# Patient Record
Sex: Male | Born: 1960 | Race: White | Hispanic: No | Marital: Single | Smoking: Current every day smoker
Health system: Southern US, Community
[De-identification: ages and names within clinical notes are randomized; demographics above are authoritative.]

## PROBLEM LIST (undated history)

## (undated) ENCOUNTER — Ambulatory Visit

## (undated) ENCOUNTER — Encounter

## (undated) ENCOUNTER — Encounter: Attending: Geriatric Medicine | Primary: Geriatric Medicine

## (undated) ENCOUNTER — Telehealth

## (undated) ENCOUNTER — Encounter: Attending: Internal Medicine | Primary: Internal Medicine

## (undated) ENCOUNTER — Ambulatory Visit: Payer: MEDICAID

## (undated) ENCOUNTER — Ambulatory Visit: Attending: Internal Medicine | Primary: Internal Medicine

## (undated) ENCOUNTER — Telehealth: Attending: Internal Medicine | Primary: Internal Medicine

## (undated) ENCOUNTER — Encounter: Attending: Urology | Primary: Urology

## (undated) ENCOUNTER — Ambulatory Visit: Payer: MEDICAID | Attending: Family | Primary: Family

## (undated) ENCOUNTER — Ambulatory Visit
Attending: Pharmacist Clinician (PhC)/ Clinical Pharmacy Specialist | Primary: Pharmacist Clinician (PhC)/ Clinical Pharmacy Specialist

## (undated) ENCOUNTER — Ambulatory Visit: Payer: MEDICAID | Attending: Internal Medicine | Primary: Internal Medicine

## (undated) ENCOUNTER — Ambulatory Visit: Payer: MEDICAID | Attending: Geriatric Medicine | Primary: Geriatric Medicine

## (undated) ENCOUNTER — Inpatient Hospital Stay

## (undated) ENCOUNTER — Telehealth: Attending: Urology | Primary: Urology

## (undated) ENCOUNTER — Ambulatory Visit
Attending: Student in an Organized Health Care Education/Training Program | Primary: Student in an Organized Health Care Education/Training Program

## (undated) ENCOUNTER — Ambulatory Visit: Attending: Urology | Primary: Urology

## (undated) ENCOUNTER — Telehealth: Attending: Family | Primary: Family

## (undated) ENCOUNTER — Telehealth: Attending: Geriatric Medicine | Primary: Geriatric Medicine

## (undated) DIAGNOSIS — C7951 Secondary malignant neoplasm of bone: Principal | ICD-10-CM

## (undated) DIAGNOSIS — C649 Malignant neoplasm of unspecified kidney, except renal pelvis: Secondary | ICD-10-CM

## (undated) HISTORY — PX: HERNIA REPAIR: SHX51

## (undated) HISTORY — DX: Secondary malignant neoplasm of bone: C79.51

## (undated) HISTORY — DX: Malignant neoplasm of unspecified kidney, except renal pelvis: C64.9

## (undated) HISTORY — PX: OTHER SURGICAL HISTORY: SHX169

---
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
## 2005-07-10 ENCOUNTER — Emergency Department: Payer: Self-pay | Admitting: Emergency Medicine

## 2008-03-21 ENCOUNTER — Emergency Department: Payer: Self-pay | Admitting: Emergency Medicine

## 2009-05-13 ENCOUNTER — Emergency Department: Payer: Self-pay | Admitting: Unknown Physician Specialty

## 2017-04-09 ENCOUNTER — Encounter: Payer: Self-pay | Admitting: Emergency Medicine

## 2017-04-09 ENCOUNTER — Emergency Department
Admission: EM | Admit: 2017-04-09 | Discharge: 2017-04-09 | Disposition: A | Discharge status: Home / Self Care | Payer: Self-pay | Attending: Emergency Medicine | Admitting: Emergency Medicine

## 2017-04-09 DIAGNOSIS — F17200 Nicotine dependence, unspecified, uncomplicated: Secondary | ICD-10-CM | POA: Insufficient documentation

## 2017-04-09 DIAGNOSIS — M62830 Muscle spasm of back: Secondary | ICD-10-CM

## 2017-04-09 DIAGNOSIS — M62838 Other muscle spasm: Secondary | ICD-10-CM | POA: Insufficient documentation

## 2017-04-09 MED ORDER — KETOROLAC TROMETHAMINE 30 MG/ML IJ SOLN
30.0000 mg | Freq: Once | INTRAMUSCULAR | Status: AC
  Administered 2017-04-09: 30 mg via INTRAMUSCULAR

## 2017-04-09 MED ORDER — NAPROXEN 500 MG PO TABS: 500.0000 mg | ORAL_TABLET | Freq: Two times a day (BID) | ORAL | 2 refills | Status: DC

## 2017-04-09 MED ORDER — CYCLOBENZAPRINE HCL 10 MG PO TABS: 10.0000 mg | ORAL_TABLET | Freq: Three times a day (TID) | ORAL | 0 refills | Status: DC | PRN

## 2017-04-09 MED ORDER — KETOROLAC TROMETHAMINE 30 MG/ML IJ SOLN
INTRAMUSCULAR | Status: AC
  Filled 2017-04-09: qty 1

## 2017-04-09 NOTE — ED Provider Notes (Signed)
[facility] Emergency Department Provider Note   ____________________________________________    I have reviewed the triage vital signs and the nursing notes.   HISTORY  Chief Complaint Back Pain     HPI Larry Watkins is a 56 y.o. male who p/w back pain. Patient reports moderate back pain that started last week in between his shoulder blades and was worth with movement. Pain has migrated to left mid back just beneath shoulder blade and he reports it is a moderate "grabbing" pain worse with rotation and flexion. Has taken ibuprofen. No chest pain. Nofevers/chills. No abd pain. No sob or pleurisy.   History reviewed. No pertinent past medical history.  There are no active problems to display for this patient.   Past Surgical History:  Procedure Laterality Date  . HERNIA REPAIR      Prior to Admission medications   Medication Sig Start Date End Date Taking? Authorizing Provider  cyclobenzaprine (FLEXERIL) 10 MG tablet Take 1 tablet (10 mg total) by mouth 3 (three) times daily as needed for muscle spasms. 04/09/17   Lavonia Drafts, MD  naproxen (NAPROSYN) 500 MG tablet Take 1 tablet (500 mg total) by mouth 2 (two) times daily with a meal. 04/09/17   Lavonia Drafts, MD     Allergies Patient has no known allergies.  No family history on file.  Social History Social History  Substance Use Topics  . Smoking status: Current Every Day Smoker  . Smokeless tobacco: Never Used  . Alcohol use No    Review of Systems  Constitutional: No fever/chills  ENT: No sore throat.   Gastrointestinal: No abdominal pain.  No nausea, no vomiting.   Genitourinary: Negative for incontinence Musculoskeletal: as above Skin: Negative for rash. Neurological: Negative for weakness    ____________________________________________   PHYSICAL EXAM:  VITAL SIGNS: ED Triage Vitals  Enc Vitals Group     BP 04/09/17 1008 (!) 162/92     Pulse Rate 04/09/17  1008 77     Resp 04/09/17 1008 16     Temp 04/09/17 1008 98.2 F (36.8 C)     Temp Source 04/09/17 1008 Oral     SpO2 04/09/17 1008 100 %     Weight 04/09/17 1008 242 lb (109.8 kg)     Height 04/09/17 1008 5\' 7"  (1.702 m)     Head Circumference --      Peak Flow --      Pain Score 04/09/17 1005 8     Pain Loc --      Pain Edu? --      Excl. in [place]? --     Constitutional: Alert and oriented. No acute distress. Pleasant and interactive Eyes: Conjunctivae are normal.  Head: Atraumatic. Nose: No congestion/rhinnorhea. Mouth/Throat: Mucous membranes are moist.   Cardiovascular: Normal rate, regular rhythm.  Respiratory: Normal respiratory effort.  No retractions. Genitourinary: deferred Musculoskeletal: No lower extremity tenderness nor edema.  Back: no vertebral ttp. Point ttp just beneath the left scapula centrally which mimics his pain. C/w muscle spasm Neurologic:  Normal speech and language. No gross focal neurologic deficits are appreciated.   Skin:  Skin is warm, dry and intact. No rash noted.   ____________________________________________   LABS (all labs ordered are listed, but only abnormal results are displayed)  Labs Reviewed - No data to display ____________________________________________  EKG   ____________________________________________  RADIOLOGY  none ____________________________________________   PROCEDURES  Procedure(s) performed: No    Critical Care performed: No  ____________________________________________   INITIAL IMPRESSION / ASSESSMENT AND PLAN / ED COURSE  Pertinent labs & imaging results that were available during my care of the patient were reviewed by me and considered in my medical decision making (see chart for details).  Patient hpi and exam c/w muscle strain/spasm. Recommend conservative tx   ____________________________________________   FINAL CLINICAL IMPRESSION(S) / ED DIAGNOSES  Final diagnoses:  Muscle spasm of  back      NEW MEDICATIONS STARTED DURING THIS VISIT:  Discharge Medication List as of 04/09/2017 11:39 AM    START taking these medications   Details  cyclobenzaprine (FLEXERIL) 10 MG tablet Take 1 tablet (10 mg total) by mouth 3 (three) times daily as needed for muscle spasms., Starting Wed 04/09/2017, Print    naproxen (NAPROSYN) 500 MG tablet Take 1 tablet (500 mg total) by mouth 2 (two) times daily with a meal., Starting Wed 04/09/2017, Print         Note:  This document was prepared using Dragon voice recognition software and may include unintentional dictation errors.    Lavonia Drafts, MD 04/09/17 [phone]

## 2017-04-09 NOTE — ED Notes (Signed)
Pt reports lower back pain that started one week ago - pt denies any injury to the area - denies pain or difficulty with urination

## 2017-04-09 NOTE — ED Triage Notes (Signed)
Back pain for about a week started upper back and now left lower.  Increases with movement

## 2017-06-26 ENCOUNTER — Emergency Department: Payer: Self-pay

## 2017-06-26 ENCOUNTER — Encounter: Payer: Self-pay | Admitting: Emergency Medicine

## 2017-06-26 ENCOUNTER — Emergency Department
Admission: EM | Admit: 2017-06-26 | Discharge: 2017-06-26 | Disposition: A | Discharge status: Home / Self Care | Payer: Self-pay | Attending: Emergency Medicine | Admitting: Emergency Medicine

## 2017-06-26 DIAGNOSIS — Y9389 Activity, other specified: Secondary | ICD-10-CM | POA: Insufficient documentation

## 2017-06-26 DIAGNOSIS — Y929 Unspecified place or not applicable: Secondary | ICD-10-CM | POA: Insufficient documentation

## 2017-06-26 DIAGNOSIS — N2889 Other specified disorders of kidney and ureter: Secondary | ICD-10-CM | POA: Insufficient documentation

## 2017-06-26 DIAGNOSIS — R109 Unspecified abdominal pain: Secondary | ICD-10-CM | POA: Insufficient documentation

## 2017-06-26 DIAGNOSIS — M899 Disorder of bone, unspecified: Secondary | ICD-10-CM | POA: Insufficient documentation

## 2017-06-26 DIAGNOSIS — X501XXA Overexertion from prolonged static or awkward postures, initial encounter: Secondary | ICD-10-CM | POA: Insufficient documentation

## 2017-06-26 DIAGNOSIS — M84411A Pathological fracture, right shoulder, initial encounter for fracture: Secondary | ICD-10-CM | POA: Insufficient documentation

## 2017-06-26 DIAGNOSIS — M25511 Pain in right shoulder: Secondary | ICD-10-CM | POA: Insufficient documentation

## 2017-06-26 DIAGNOSIS — Y998 Other external cause status: Secondary | ICD-10-CM | POA: Insufficient documentation

## 2017-06-26 LAB — CBC WITH DIFFERENTIAL/PLATELET
Basophils Absolute: 0 10*3/uL (ref 0–0.1)
Basophils Relative: 1 %
Eosinophils Absolute: 0.2 10*3/uL (ref 0–0.7)
Eosinophils Relative: 2 %
HEMATOCRIT: 45 % (ref 40.0–52.0)
Hemoglobin: 15.5 g/dL (ref 13.0–18.0)
LYMPHS PCT: 30 %
Lymphs Abs: 2.4 10*3/uL (ref 1.0–3.6)
MCH: 30.9 pg (ref 26.0–34.0)
MCHC: 34.5 g/dL (ref 32.0–36.0)
MCV: 89.7 fL (ref 80.0–100.0)
MONO ABS: 0.7 10*3/uL (ref 0.2–1.0)
MONOS PCT: 9 %
NEUTROS ABS: 4.6 10*3/uL (ref 1.4–6.5)
Neutrophils Relative %: 58 %
Platelets: 231 10*3/uL (ref 150–440)
RBC: 5.01 MIL/uL (ref 4.40–5.90)
RDW: 12.9 % (ref 11.5–14.5)
WBC: 8 10*3/uL (ref 3.8–10.6)

## 2017-06-26 LAB — URINALYSIS, COMPLETE (UACMP) WITH MICROSCOPIC
Bacteria, UA: NONE SEEN
Bilirubin Urine: NEGATIVE
GLUCOSE, UA: NEGATIVE mg/dL
Ketones, ur: NEGATIVE mg/dL
LEUKOCYTES UA: NEGATIVE
NITRITE: NEGATIVE
PH: 6 (ref 5.0–8.0)
Protein, ur: NEGATIVE mg/dL
Specific Gravity, Urine: 1.017 (ref 1.005–1.030)

## 2017-06-26 LAB — COMPREHENSIVE METABOLIC PANEL
ALT: 16 U/L — ABNORMAL LOW (ref 17–63)
ANION GAP: 6 (ref 5–15)
AST: 14 U/L — ABNORMAL LOW (ref 15–41)
Albumin: 3.3 g/dL — ABNORMAL LOW (ref 3.5–5.0)
Alkaline Phosphatase: 111 U/L (ref 38–126)
BUN: 13 mg/dL (ref 6–20)
CO2: 30 mmol/L (ref 22–32)
Calcium: 9 mg/dL (ref 8.9–10.3)
Chloride: 103 mmol/L (ref 101–111)
Creatinine, Ser: 0.95 mg/dL (ref 0.61–1.24)
GFR calc Af Amer: >60 mL/min (ref >60)
Glucose, Bld: 104 mg/dL — ABNORMAL HIGH (ref 65–99)
POTASSIUM: 4.2 mmol/L (ref 3.5–5.1)
Sodium: 139 mmol/L (ref 135–145)
TOTAL PROTEIN: 6.6 g/dL (ref 6.5–8.1)
Total Bilirubin: 0.2 mg/dL — ABNORMAL LOW (ref 0.3–1.2)

## 2017-06-26 LAB — PROTIME-INR
INR: 0.93
PROTHROMBIN TIME: 12.5 s (ref 11.4–15.2)

## 2017-06-26 LAB — LIPASE, BLOOD: Lipase: 22 U/L (ref 11–51)

## 2017-06-26 MED ORDER — MORPHINE SULFATE (PF) 4 MG/ML IV SOLN
4.0000 mg | Freq: Once | INTRAVENOUS | Status: AC
  Administered 2017-06-26: 4 mg via INTRAVENOUS
  Filled 2017-06-26: qty 1

## 2017-06-26 MED ORDER — IOPAMIDOL (ISOVUE-300) INJECTION 61%
100.0000 mL | Freq: Once | INTRAVENOUS | Status: AC | PRN
  Administered 2017-06-26: 100 mL via INTRAVENOUS

## 2017-06-26 MED ORDER — DOCUSATE SODIUM 100 MG PO CAPS: 100.0000 mg | ORAL_CAPSULE | Freq: Every day | ORAL | 2 refills | Status: DC | PRN

## 2017-06-26 MED ORDER — ONDANSETRON HCL 4 MG/2ML IJ SOLN
4.0000 mg | Freq: Once | INTRAMUSCULAR | Status: AC
  Administered 2017-06-26: 4 mg via INTRAVENOUS
  Filled 2017-06-26: qty 2

## 2017-06-26 MED ORDER — OXYCODONE-ACETAMINOPHEN 5-325 MG PO TABS: 1.0000 | ORAL_TABLET | Freq: Four times a day (QID) | ORAL | 0 refills | Status: DC | PRN

## 2017-06-26 NOTE — ED Notes (Signed)
Pt in xray

## 2017-06-26 NOTE — Discharge Instructions (Signed)
As we discussed, we see a mass on your  kidney which is likely cancer, with lesions on your bones that are likely also cthe same.  We are very sorry to share this news with you. Take the pain medication as prescribed, and follow up closely with oncology tomorrow. You will also need to see an orthopedic doctor. I am giving you information about [facility] orthopedics, however, I would ask the oncologist tomorrow whom they would recommend that you follow up with. If you have any new or worrisome symptoms, return to the emergency department. Wear the splint for comfort. Do not drink or drive on percocet.

## 2017-06-26 NOTE — ED Triage Notes (Signed)
Pt reports right shoulder pain and immobility for three months after reaching below a bar to grab a glass. Pt denies new shoulder pain but states just can't take the pain anymore. Pt unable to raise right arm at all. Pt reports new onset flank pain with pink urine for 3-4 days.

## 2017-06-26 NOTE — ED Notes (Signed)
Arm sling applied to right arm by this Probation officer. Cms intact.

## 2017-06-26 NOTE — ED Provider Notes (Addendum)
[facility] Emergency Department Provider Note  ____________________________________________   I have reviewed the triage vital signs and the nursing notes.   HISTORY  Chief Complaint Shoulder Pain and Flank Pain    HPI Larry Watkins is a 56 y.o. male who presents today complaining of right-sided shoulder pain which she has had for 3 or 4 months. He states that he felt a "pop" when he was reaching down to pickups a glass on the floor a few months ago and the pain has been getting worse and worse. He also states he has a history of kidney stones and has been having left-sided flank pain for the last week. This is not consistent though he believes his prior kidney stone pain. He has hadno dysuria but he did notice hematuria about 2 weeks ago and took a few doses of amoxicillin which she had, the hematuria cleared up and he stopped taking that antibiotic. He had no time had any fever. He denies any nausea or vomiting, he has no night sweats or weight loss. Patient has at least a 40 year smoking history, he states that he has had a slight cough recently. He denies any headache he denies any recent travel he denies any abdominal pain or vomiting or change in his stool.     History reviewed. No pertinent past medical history.  There are no active problems to display for this patient.   Past Surgical History:  Procedure Laterality Date  . HERNIA REPAIR      Prior to Admission medications   Medication Sig Start Date End Date Taking? Authorizing Provider  ibuprofen (ADVIL,MOTRIN) 200 MG tablet Take 200 mg by mouth every 6 (six) hours as needed.   Yes [provider]  cyclobenzaprine (FLEXERIL) 10 MG tablet Take 1 tablet (10 mg total) by mouth 3 (three) times daily as needed for muscle spasms. Patient not taking: Reported on 06/26/2017 04/09/17   Lavonia Drafts, MD  naproxen (NAPROSYN) 500 MG tablet Take 1 tablet (500 mg total) by mouth 2 (two) times daily  with a meal. Patient not taking: Reported on 06/26/2017 04/09/17   Lavonia Drafts, MD    Allergies Patient has no known allergies.  No family history on file.  Social History Social History  Substance Use Topics  . Smoking status: Current Every Day Smoker  . Smokeless tobacco: Never Used  . Alcohol use No    Review of Systems Constitutional: No fever/chills Eyes: No visual changes. ENT: No sore throat. No stiff neck no neck pain Cardiovascular: Denies chest pain. Respiratory: Denies shortness of breath. Gastrointestinal:   no vomiting.  No diarrhea.  No constipation. Genitourinary: Negative for dysuria. Musculoskeletal: Negative lower extremity swelling Skin: Negative for rash. Neurological: Negative for severe headaches, focal weakness or numbness.   ____________________________________________   PHYSICAL EXAM:  VITAL SIGNS: ED Triage Vitals [06/26/17 1013]  Enc Vitals Group     BP (!) 152/80     Pulse Rate 92     Resp 20     Temp 98.4 F (36.9 C)     Temp Source Oral     SpO2 95 %     Weight 248 lb (112.5 kg)     Height 5\' 7"  (1.702 m)     Head Circumference      Peak Flow      Pain Score 10     Pain Loc      Pain Edu?      Excl. in [place]?  Constitutional: Alert and oriented. Well appearing and in no acute distress.Anxious and appears mildly uncomfortable holding his right arm and flexion over his abdomen Eyes: Conjunctivae are normal Head: Atraumatic HEENT: No congestion/rhinnorhea. Mucous membranes are moist.  Oropharynx non-erythematous Neck:   Nontender with no meningismus, no masses, no stridor Cardiovascular: Normal rate, regular rhythm. Grossly normal heart sounds.  Good peripheral circulation. Respiratory: Normal respiratory effort.  No retractions. Lungs CTAB. Abdominal: Soft and nontender. No distention. No guarding no rebound Back:  There is no focal tenderness or step off.  there is no midline tenderness there are no lesions noted. there is  positive left CVA tenderness GU: Normal motion of genitalia no obvious masses noted. Musculoskeletal: No lower extremity tenderness, there is no tenderness to the wrist elbow or shoulder itself, the palpation of the scalp feeling provides the patient a great of discomfort as well as raising the shoulder. There is no erythema it is not warm to touch. No joint effusions, no DVT signs strong distal pulses no edema Neurologic:  Normal speech and language. No gross focal neurologic deficits are appreciated.  Skin:  Skin is warm, dry and intact. No rash noted. Psychiatric: Mood and affect are normal. Speech and behavior are normal.  ____________________________________________   LABS (all labs ordered are listed, but only abnormal results are displayed)  Labs Reviewed  URINALYSIS, COMPLETE (UACMP) WITH MICROSCOPIC - Abnormal; Notable for the following:       Result Value   Color, Urine YELLOW (*)    APPearance CLEAR (*)    Hgb urine dipstick MODERATE (*)    Squamous Epithelial / LPF 0-5 (*)    All other components within normal limits  URINE CULTURE  CBC WITH DIFFERENTIAL/PLATELET  COMPREHENSIVE METABOLIC PANEL  PROTIME-INR  LIPASE, BLOOD   ____________________________________________  EKG  I personally interpreted any EKGs ordered by me or triage  ____________________________________________  RADIOLOGY  I reviewed any imaging ordered by me or triage that were performed during my shift and, if possible, patient and/or family made aware of any abnormal findings. ____________________________________________   PROCEDURES  Procedure(s) performed: None  Procedures  Critical Care performed: None  ____________________________________________   INITIAL IMPRESSION / ASSESSMENT AND PLAN / ED COURSE  Pertinent labs & imaging results that were available during my care of the patient were reviewed by me and considered in my medical decision making (see chart for  details).  Administration here for 2 different things. The first is his right shoulder pain which is been there for several months. X-ray shows what is likely a lytic lesion causing a pathologic fracture. This raises concern for possibility of oncologic process other where in the patient's body. I did discuss with Dr. Susy Manor, who would prefer a CT chest abdomen pelvis which we will performed. I discussed with radiology, they do not think that a noncontrasted study of his abdomen be of utility as they do believe they can see kidney stones even if we give contrast. Kidney stones is also on the differential patient's pain in his flank and his hematuria could be true true and unrelated. If his Kidney function is reassuring, we will obtain contrasted images. If not, we'll do it without contrast.  ----------------------------------------- 3:52 PM on 06/26/2017 -----------------------------------------  Dr. Susy Manor has arranged outpatient follow-up with the patient tomorrow at 3:00, she feels comfortable discharge as do I, patient very much would prefer to go home he does not wish to be admitted to the hospital. We will give him a sling for  comfort for his pathologic scapular fracture, I will refer him to [facility] orthopedics as I suspect this likely will transcend they will use of our local providers. However I did specify that he does need to talk to oncology about what the best course of action is for his pathologic bone lesions. We will send him home with Percocet. He knows he must not drive and Percocet. 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 He does understand the gravity of it. He will return for new or worrisome symptoms and he is going to follow closely with oncology and orthopedics. Patient grateful for our care, extensive return precautions given and understood.  This is a very unfortunate case.    ____________________________________________   FINAL CLINICAL IMPRESSION(S) / ED DIAGNOSES  Final diagnoses:  None      This chart was dictated using voice recognition software.  Despite best efforts to proofread,  errors can occur which can change meaning.      Schuyler Amor, MD 06/26/17 1237    Schuyler Amor, MD 06/26/17 [phone]

## 2017-06-27 ENCOUNTER — Encounter: Payer: Self-pay | Admitting: Oncology

## 2017-06-27 ENCOUNTER — Inpatient Hospital Stay: Payer: Self-pay | Attending: Oncology | Admitting: Oncology

## 2017-06-27 ENCOUNTER — Inpatient Hospital Stay: Payer: Self-pay

## 2017-06-27 VITALS — BP 148/87 | HR 82 | Temp 98.0°F | Wt 244.2 lb

## 2017-06-27 DIAGNOSIS — M25511 Pain in right shoulder: Secondary | ICD-10-CM

## 2017-06-27 DIAGNOSIS — F17210 Nicotine dependence, cigarettes, uncomplicated: Secondary | ICD-10-CM

## 2017-06-27 DIAGNOSIS — M899 Disorder of bone, unspecified: Secondary | ICD-10-CM

## 2017-06-27 DIAGNOSIS — N2889 Other specified disorders of kidney and ureter: Secondary | ICD-10-CM

## 2017-06-27 DIAGNOSIS — R109 Unspecified abdominal pain: Secondary | ICD-10-CM

## 2017-06-27 DIAGNOSIS — M7989 Other specified soft tissue disorders: Secondary | ICD-10-CM

## 2017-06-27 DIAGNOSIS — Z720 Tobacco use: Secondary | ICD-10-CM

## 2017-06-27 DIAGNOSIS — C7951 Secondary malignant neoplasm of bone: Secondary | ICD-10-CM

## 2017-06-27 LAB — URINE CULTURE

## 2017-06-27 LAB — LACTATE DEHYDROGENASE: LDH: 167 U/L (ref 98–192)

## 2017-06-29 ENCOUNTER — Encounter: Payer: Self-pay | Admitting: Oncology

## 2017-06-29 NOTE — Progress Notes (Signed)
[name]:  Patient Care Team: Patient, No Pcp Per as PCP - General (General Practice)  CHIEF COMPLAINTS/PURPOSE OF CONSULTATION: I have Right shoulder pain.  HISTORY OF PRESENTING ILLNESS: Larry Watkins 56 y.o. male is here for evaluation of abnormal image finding. Patient has had right shoulder pain  three months after reaching below a bar to grab a glass. Patient is not able to raise his right arm over shoulder level.  Also having left flank pain and 2 weeks ago he has had pink urine for a few days and resolved after taking a course of amoxicillin.  He is otherwise healthy and does not take any medication.  CT scan showed a concerning right kidney mass with possible metastatic disease with thoracic and abdominal lymphadenopathy, lung nodules, and diffuse osseous metastases. Patient has 40 pack year smoking history.    Review of Systems  Constitutional: Negative.   HENT:  Negative.   Eyes: Negative.   Respiratory: Negative.   Cardiovascular: Negative.   Gastrointestinal: Negative.   Endocrine: Negative.   Musculoskeletal: Positive for arthralgias and flank pain.  Skin: Negative.   Neurological: Negative.   Hematological: Negative.   Psychiatric/Behavioral: Negative.     MEDICAL HISTORY: Past Medical History:  Diagnosis Date  . Cancer [place])     SURGICAL HISTORY: Past Surgical History:  Procedure Laterality Date  . HERNIA REPAIR      SOCIAL HISTORY: Social History   Social History  . Marital status: Single    Spouse name: N/A  . Number of children: N/A  . Years of education: N/A   Occupational History  . Not on file.   Social History Main Topics  . Smoking status: Current Every Day Smoker    Packs/day: 2.00    Years: 40.00  . Smokeless tobacco: Never Used  . Alcohol use No  . Drug use: No  . Sexual activity: Not on file   Other Topics Concern  . Not on file   Social History Narrative  . No narrative on file     FAMILY HISTORY History reviewed. No pertinent family history.  ALLERGIES:  has No Known Allergies.  MEDICATIONS:  No current outpatient prescriptions on file.   No current facility-administered medications for this visit.    Vitals:   06/27/17 1516  BP: (!) 148/87  Pulse: 82  Temp: 98 F (36.7 C)    Filed Weights   06/27/17 1516  Weight: 244 lb 3.2 oz (110.8 kg)  Physical Exam PHYSICAL EXAMINATION:  ECOG PERFORMANCE STATUS: 0 - Asymptomatic GENERAL: No distress, well nourished.  SKIN:  No rashes or significant lesions  HEAD: Normocephalic, No masses, lesions, tenderness or abnormalities  EYES: Conjunctiva are pink, non icteric ENT: External ears normal ,lips , buccal mucosa, and tongue normal and mucous membranes are moist  LYMPH: No palpable cervical and axillary lymphadenopathy  LUNGS: Clear to auscultation, no crackles or wheezes HEART: Regular rate & rhythm, no murmurs, no gallops, S1 normal and S2 normal  ABDOMEN: Abdomen soft, non-tender, normal bowel sounds, I did not appreciate any  masses or organomegaly  MUSCULOSKELETAL: No CVA tenderness and no tenderness on percussion of the back or rib cage. Right scapular blade palpable mass. EXTREMITIES: No edema, no skin discoloration or tenderness NEURO: Alert & oriented, no focal motor/sensory deficits.  LABORATORY DATA: I have personally reviewed the data as listed:  Appointment on 06/27/2017  Component Date Value Ref Range Status  . LDH 06/27/2017 167  98 -  192 U/L Final  Admission on 06/26/2017, Discharged on 06/26/2017  Component Date Value Ref Range Status  . Color, Urine 06/26/2017 YELLOW* YELLOW Final  . APPearance 06/26/2017 CLEAR* CLEAR Final  . Specific Gravity, Urine 06/26/2017 1.017  1.005 - 1.030 Final  . pH 06/26/2017 6.0  5.0 - 8.0 Final  . Glucose, UA 06/26/2017 NEGATIVE  NEGATIVE mg/dL Final  . Hgb urine dipstick 06/26/2017 MODERATE* NEGATIVE Final  . Bilirubin Urine 06/26/2017 NEGATIVE   NEGATIVE Final  . Ketones, ur 06/26/2017 NEGATIVE  NEGATIVE mg/dL Final  . Protein, ur 06/26/2017 NEGATIVE  NEGATIVE mg/dL Final  . Nitrite 06/26/2017 NEGATIVE  NEGATIVE Final  . Leukocytes, UA 06/26/2017 NEGATIVE  NEGATIVE Final  . RBC / HPF 06/26/2017 TOO NUMEROUS TO COUNT  0 - 5 RBC/hpf Final  . WBC, UA 06/26/2017 0-5  0 - 5 WBC/hpf Final  . Bacteria, UA 06/26/2017 NONE SEEN  NONE SEEN Final  . Squamous Epithelial / LPF 06/26/2017 0-5* NONE SEEN Final  . Mucous 06/26/2017 PRESENT   Final  . Hyaline Casts, UA 06/26/2017 PRESENT   Final  . WBC 06/26/2017 8.0  3.8 - 10.6 K/uL Final  . RBC 06/26/2017 5.01  4.40 - 5.90 MIL/uL Final  . Hemoglobin 06/26/2017 15.5  13.0 - 18.0 g/dL Final  . HCT 06/26/2017 45.0  40.0 - 52.0 % Final  . MCV 06/26/2017 89.7  80.0 - 100.0 fL Final  . MCH 06/26/2017 30.9  26.0 - 34.0 pg Final  . MCHC 06/26/2017 34.5  32.0 - 36.0 g/dL Final  . RDW 06/26/2017 12.9  11.5 - 14.5 % Final  . Platelets 06/26/2017 231  150 - 440 K/uL Final  . Neutrophils Relative % 06/26/2017 58  % Final  . Neutro Abs 06/26/2017 4.6  1.4 - 6.5 K/uL Final  . Lymphocytes Relative 06/26/2017 30  % Final  . Lymphs Abs 06/26/2017 2.4  1.0 - 3.6 K/uL Final  . Monocytes Relative 06/26/2017 9  % Final  . Monocytes Absolute 06/26/2017 0.7  0.2 - 1.0 K/uL Final  . Eosinophils Relative 06/26/2017 2  % Final  . Eosinophils Absolute 06/26/2017 0.2  0 - 0.7 K/uL Final  . Basophils Relative 06/26/2017 1  % Final  . Basophils Absolute 06/26/2017 0.0  0 - 0.1 K/uL Final  . Sodium 06/26/2017 139  135 - 145 mmol/L Final  . Potassium 06/26/2017 4.2  3.5 - 5.1 mmol/L Final  . Chloride 06/26/2017 103  101 - 111 mmol/L Final  . CO2 06/26/2017 30  22 - 32 mmol/L Final  . Glucose, Bld 06/26/2017 104* 65 - 99 mg/dL Final  . BUN 06/26/2017 13  6 - 20 mg/dL Final  . Creatinine, Ser 06/26/2017 0.95  0.61 - 1.24 mg/dL Final  . Calcium 06/26/2017 9.0  8.9 - 10.3 mg/dL Final  . Total Protein 06/26/2017 6.6  6.5  - 8.1 g/dL Final  . Albumin 06/26/2017 3.3* 3.5 - 5.0 g/dL Final  . AST 06/26/2017 14* 15 - 41 U/L Final  . ALT 06/26/2017 16* 17 - 63 U/L Final  . Alkaline Phosphatase 06/26/2017 111  38 - 126 U/L Final  . Total Bilirubin 06/26/2017 0.2* 0.3 - 1.2 mg/dL Final  . GFR calc non Af Amer 06/26/2017 >60  >60 mL/min Final  . GFR calc Af Amer 06/26/2017 >60  >60 mL/min Final   Comment: (NOTE) The eGFR has been calculated using the CKD EPI equation. This calculation has not been validated in all clinical situations. eGFR's persistently <60  mL/min signify possible Chronic Kidney Disease.   . Anion gap 06/26/2017 6  5 - 15 Final  . Specimen Description 06/26/2017 URINE, RANDOM   Final  . Special Requests 06/26/2017 NONE   Final  . Culture 06/26/2017 *  Final                   Value:<10,000 COLONIES/mL INSIGNIFICANT GROWTH Performed at [facility], [place] [address], [place], [place] [address]   . Report Status 06/26/2017 06/27/2017 FINAL   Final  . Prothrombin Time 06/26/2017 12.5  11.4 - 15.2 seconds Final  . INR 06/26/2017 0.93   Final  . Lipase 06/26/2017 22  11 - 51 U/L Final    RADIOGRAPHIC STUDIES: I have personally reviewed the radiological images as listed and agree with the findings in the report CT chest abdomen pelvis 06/26/2017  IMPRESSION: 1. 7 cm right lower pole renal mass. This may reflect urothelial carcinoma given involvement of the collecting system versus renal cell carcinoma. 2. Metastatic disease with thoracic and abdominal lymphadenopathy, lung nodules, and diffuse osseous metastases. 3. Subcentimeter lesion in the right upper lobe bronchus with diffuse abnormal right upper lobe bronchial wall soft tissue thickening.    ASSESSMENT/PLAN 1. Kidney mass   2. Soft tissue mass   3. Metastasis to bone ([place])   4. Tobacco abuse    # I reviewed the CT results with patient in details and informed him that his CT findings are very concerning for cancerous  process. The next step is to obtain a tissue diagnosis.  US guided biopsy of right scapula blade soft tissue.  # Patient to follow up 3-4 days after biopsy to discuss results and treatment plan.  # smoke cessation discussed in details.    Orders Placed This Encounter  Procedures  . US Guided Needle Placement    Standing Status:   Future    Standing Expiration Date:   08/28/2018    Order Specific Question:   Reason for Exam (SYMPTOM  OR DIAGNOSIS REQUIRED)    Answer:   right scapula mass, kidney mass staging    Comments:   please biopsy the right scapular mass    Order Specific Question:   Preferred imaging location?    Answer:   [facility]  . Lactate dehydrogenase    Standing Status:   Future    Number of Occurrences:   1    Standing Expiration Date:   06/27/2018    All questions were answered. The patient knows to call the clinic with any problems, questions or concerns.Earlie Server, MD  06/29/2017 1:04 PM

## 2017-07-01 ENCOUNTER — Other Ambulatory Visit: Payer: Self-pay | Admitting: Oncology

## 2017-07-01 DIAGNOSIS — C7951 Secondary malignant neoplasm of bone: Secondary | ICD-10-CM

## 2017-07-01 DIAGNOSIS — N2889 Other specified disorders of kidney and ureter: Secondary | ICD-10-CM

## 2017-07-01 DIAGNOSIS — M7989 Other specified soft tissue disorders: Secondary | ICD-10-CM

## 2017-07-02 ENCOUNTER — Telehealth: Payer: Self-pay | Admitting: *Deleted

## 2017-07-02 NOTE — Telephone Encounter (Signed)
Called patient to inform him that we were working on his biopsy appointment and would call him as soon as we had a date and time.

## 2017-07-02 NOTE — Telephone Encounter (Signed)
Patient called wanting to know the results of his bone biopsy.

## 2017-07-03 ENCOUNTER — Other Ambulatory Visit: Payer: Self-pay | Admitting: Oncology

## 2017-07-03 DIAGNOSIS — M7989 Other specified soft tissue disorders: Secondary | ICD-10-CM

## 2017-07-03 DIAGNOSIS — N2889 Other specified disorders of kidney and ureter: Secondary | ICD-10-CM

## 2017-07-03 DIAGNOSIS — C7951 Secondary malignant neoplasm of bone: Secondary | ICD-10-CM

## 2017-07-09 ENCOUNTER — Other Ambulatory Visit: Payer: Self-pay | Admitting: Radiology

## 2017-07-10 ENCOUNTER — Ambulatory Visit: Admission: RE | Admit: 2017-07-10 | Payer: Self-pay | Source: Ambulatory Visit

## 2017-07-10 ENCOUNTER — Ambulatory Visit
Admission: RE | Admit: 2017-07-10 | Discharge: 2017-07-10 | Disposition: A | Discharge status: Home / Self Care | Payer: Self-pay | Source: Ambulatory Visit | Attending: Oncology | Admitting: Oncology

## 2017-07-10 DIAGNOSIS — C7951 Secondary malignant neoplasm of bone: Secondary | ICD-10-CM | POA: Insufficient documentation

## 2017-07-10 DIAGNOSIS — M7989 Other specified soft tissue disorders: Secondary | ICD-10-CM

## 2017-07-10 DIAGNOSIS — N2889 Other specified disorders of kidney and ureter: Secondary | ICD-10-CM | POA: Insufficient documentation

## 2017-07-10 DIAGNOSIS — C7901 Secondary malignant neoplasm of right kidney and renal pelvis: Secondary | ICD-10-CM | POA: Insufficient documentation

## 2017-07-10 DIAGNOSIS — M799 Soft tissue disorder, unspecified: Secondary | ICD-10-CM | POA: Insufficient documentation

## 2017-07-10 LAB — CBC
HEMATOCRIT: 42.9 % (ref 40.0–52.0)
HEMOGLOBIN: 14.8 g/dL (ref 13.0–18.0)
MCH: 30.4 pg (ref 26.0–34.0)
MCHC: 34.5 g/dL (ref 32.0–36.0)
MCV: 88 fL (ref 80.0–100.0)
Platelets: 251 10*3/uL (ref 150–440)
RBC: 4.87 MIL/uL (ref 4.40–5.90)
RDW: 13.2 % (ref 11.5–14.5)
WBC: 7.6 10*3/uL (ref 3.8–10.6)

## 2017-07-10 LAB — PROTIME-INR
INR: 0.98
Prothrombin Time: 13 seconds (ref 11.4–15.2)

## 2017-07-10 LAB — APTT: aPTT: 30 seconds (ref 24–36)

## 2017-07-10 MED ORDER — FENTANYL CITRATE (PF) 100 MCG/2ML IJ SOLN
INTRAMUSCULAR | Status: AC | PRN
  Administered 2017-07-10 (×2): 50 ug via INTRAVENOUS

## 2017-07-10 MED ORDER — SODIUM CHLORIDE 0.9 % IV SOLN
INTRAVENOUS | Status: DC
  Administered 2017-07-10: 14:00:00 via INTRAVENOUS

## 2017-07-10 MED ORDER — FENTANYL CITRATE (PF) 100 MCG/2ML IJ SOLN
INTRAMUSCULAR | Status: AC
  Filled 2017-07-10: qty 2

## 2017-07-10 NOTE — Procedures (Signed)
Interventional Radiology Procedure Note  Procedure: CT guided biopsy of right iliac lytic lesion  Complications: None  Estimated Blood Loss: None  Recommendations:  - DC home  Signed,  Criselda Peaches, MD

## 2017-07-14 ENCOUNTER — Other Ambulatory Visit: Payer: Self-pay | Admitting: Pathology

## 2017-07-14 ENCOUNTER — Other Ambulatory Visit: Payer: Self-pay

## 2017-07-14 DIAGNOSIS — C641 Malignant neoplasm of right kidney, except renal pelvis: Secondary | ICD-10-CM | POA: Insufficient documentation

## 2017-07-14 LAB — SURGICAL PATHOLOGY

## 2017-07-15 ENCOUNTER — Inpatient Hospital Stay: Payer: Medicaid Other

## 2017-07-15 ENCOUNTER — Inpatient Hospital Stay: Payer: Medicaid Other | Attending: Oncology | Admitting: Oncology

## 2017-07-15 ENCOUNTER — Encounter: Payer: Self-pay | Admitting: Oncology

## 2017-07-15 ENCOUNTER — Ambulatory Visit: Payer: Self-pay

## 2017-07-15 ENCOUNTER — Telehealth: Payer: Self-pay | Admitting: *Deleted

## 2017-07-15 ENCOUNTER — Ambulatory Visit
Admission: RE | Admit: 2017-07-15 | Discharge: 2017-07-15 | Disposition: A | Discharge status: Home / Self Care | Payer: Self-pay | Source: Ambulatory Visit | Attending: Oncology | Admitting: Oncology

## 2017-07-15 ENCOUNTER — Telehealth: Payer: Self-pay

## 2017-07-15 VITALS — BP 117/79 | HR 85 | Temp 97.7°F | Wt 247.1 lb

## 2017-07-15 DIAGNOSIS — C649 Malignant neoplasm of unspecified kidney, except renal pelvis: Secondary | ICD-10-CM

## 2017-07-15 DIAGNOSIS — R071 Chest pain on breathing: Secondary | ICD-10-CM

## 2017-07-15 DIAGNOSIS — F17210 Nicotine dependence, cigarettes, uncomplicated: Secondary | ICD-10-CM | POA: Insufficient documentation

## 2017-07-15 DIAGNOSIS — C7951 Secondary malignant neoplasm of bone: Secondary | ICD-10-CM | POA: Insufficient documentation

## 2017-07-15 DIAGNOSIS — M25511 Pain in right shoulder: Secondary | ICD-10-CM

## 2017-07-15 DIAGNOSIS — G893 Neoplasm related pain (acute) (chronic): Secondary | ICD-10-CM | POA: Insufficient documentation

## 2017-07-15 DIAGNOSIS — R079 Chest pain, unspecified: Secondary | ICD-10-CM | POA: Insufficient documentation

## 2017-07-15 DIAGNOSIS — Z7189 Other specified counseling: Secondary | ICD-10-CM

## 2017-07-15 DIAGNOSIS — C641 Malignant neoplasm of right kidney, except renal pelvis: Secondary | ICD-10-CM

## 2017-07-15 DIAGNOSIS — C7800 Secondary malignant neoplasm of unspecified lung: Secondary | ICD-10-CM | POA: Insufficient documentation

## 2017-07-15 DIAGNOSIS — J9811 Atelectasis: Secondary | ICD-10-CM

## 2017-07-15 DIAGNOSIS — C799 Secondary malignant neoplasm of unspecified site: Secondary | ICD-10-CM

## 2017-07-15 DIAGNOSIS — R109 Unspecified abdominal pain: Secondary | ICD-10-CM | POA: Insufficient documentation

## 2017-07-15 DIAGNOSIS — R0602 Shortness of breath: Secondary | ICD-10-CM | POA: Insufficient documentation

## 2017-07-15 DIAGNOSIS — R590 Localized enlarged lymph nodes: Secondary | ICD-10-CM | POA: Insufficient documentation

## 2017-07-15 DIAGNOSIS — R918 Other nonspecific abnormal finding of lung field: Secondary | ICD-10-CM | POA: Insufficient documentation

## 2017-07-15 DIAGNOSIS — J9819 Other pulmonary collapse: Secondary | ICD-10-CM | POA: Insufficient documentation

## 2017-07-15 DIAGNOSIS — Z5112 Encounter for antineoplastic immunotherapy: Secondary | ICD-10-CM | POA: Insufficient documentation

## 2017-07-15 DIAGNOSIS — M129 Arthropathy, unspecified: Secondary | ICD-10-CM | POA: Diagnosis not present

## 2017-07-15 LAB — CBC WITH DIFFERENTIAL/PLATELET
BASOS PCT: 1 %
Basophils Absolute: 0.1 10*3/uL (ref 0–0.1)
Eosinophils Absolute: 0.1 10*3/uL (ref 0–0.7)
Eosinophils Relative: 1 %
HEMATOCRIT: 40.9 % (ref 40.0–52.0)
HEMOGLOBIN: 14.5 g/dL (ref 13.0–18.0)
LYMPHS ABS: 2.8 10*3/uL (ref 1.0–3.6)
Lymphocytes Relative: 29 %
MCH: 30.9 pg (ref 26.0–34.0)
MCHC: 35.5 g/dL (ref 32.0–36.0)
MCV: 87.1 fL (ref 80.0–100.0)
MONOS PCT: 8 %
Monocytes Absolute: 0.8 10*3/uL (ref 0.2–1.0)
NEUTROS ABS: 6.1 10*3/uL (ref 1.4–6.5)
NEUTROS PCT: 61 %
Platelets: 241 10*3/uL (ref 150–440)
RBC: 4.7 MIL/uL (ref 4.40–5.90)
RDW: 12.9 % (ref 11.5–14.5)
WBC: 9.9 10*3/uL (ref 3.8–10.6)

## 2017-07-15 LAB — COMPREHENSIVE METABOLIC PANEL
ALT: 15 U/L — ABNORMAL LOW (ref 17–63)
ANION GAP: 7 (ref 5–15)
AST: 14 U/L — ABNORMAL LOW (ref 15–41)
Albumin: 3.4 g/dL — ABNORMAL LOW (ref 3.5–5.0)
Alkaline Phosphatase: 124 U/L (ref 38–126)
BUN: 18 mg/dL (ref 6–20)
CALCIUM: 8.7 mg/dL — AB (ref 8.9–10.3)
CHLORIDE: 104 mmol/L (ref 101–111)
CO2: 26 mmol/L (ref 22–32)
Creatinine, Ser: 0.93 mg/dL (ref 0.61–1.24)
GFR calc non Af Amer: >60 mL/min (ref >60)
Glucose, Bld: 97 mg/dL (ref 65–99)
Potassium: 4 mmol/L (ref 3.5–5.1)
SODIUM: 137 mmol/L (ref 135–145)
Total Bilirubin: 0.5 mg/dL (ref 0.3–1.2)
Total Protein: 6.6 g/dL (ref 6.5–8.1)

## 2017-07-15 MED ORDER — IOPAMIDOL (ISOVUE-370) INJECTION 76%
75.0000 mL | Freq: Once | INTRAVENOUS | Status: AC | PRN
  Administered 2017-07-15: 75 mL via INTRAVENOUS

## 2017-07-15 MED ORDER — OXYCODONE-ACETAMINOPHEN 5-325 MG PO TABS: 1.0000 | ORAL_TABLET | Freq: Four times a day (QID) | ORAL | 0 refills | Status: DC | PRN

## 2017-07-15 NOTE — Telephone Encounter (Signed)
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 CT chest reviewed, there is subsegmental collapse of RUL, likely from lymphadenopathy, vs. Endobronchial lesion, will likely require bronch.  Pt has appt with Dr. Alva Garnet in 2 days which is appropriate. If his breathing grows worse before then with desaturations he should be referred to ED.

## 2017-07-15 NOTE — Telephone Encounter (Signed)
Spoke with Larry Watkins at Dr. Tasia Catchings office and informed her to have pt to come in 07/17/17 at 10:45am and if pt gets SOB or any other issues between now and then to go to ER per DR. appt scheduled. Nothing further needed.

## 2017-07-15 NOTE — Telephone Encounter (Signed)
Per Dr Tasia Catchings, notified pt that CT results showed a collapsed lung.  Contacted [facility], Dr Alva Garnet reviewed pt's CT and agreed to see pt on 07/17/17 @ 11:00, notified pt of this appt.  Advised pt that if symptoms worsen or feels extremely SOB to be seen at the ED.

## 2017-07-15 NOTE — Telephone Encounter (Signed)
Pt has got a slot held to be seen on Thursday. Cancer center is asking for something sooner but no provider in clinic tomorrow. CT angio done today. Please advise.

## 2017-07-15 NOTE — Telephone Encounter (Signed)
Urgent new patient referral from cancer center scheduled with Dr. Alva Garnet on 8/16 at 11 do we have anything any sooner    Please advise .

## 2017-07-15 NOTE — Progress Notes (Signed)
Patient here today for follow up.  Patient c/o chest heaviness with cough, coughing up clear phlegm

## 2017-07-15 NOTE — Progress Notes (Signed)
START ON PATHWAY REGIMEN - Renal Cell     A cycle is every 21 days:     Nivolumab      Ipilimumab    A cycle is every 28 days:     Nivolumab   **Always confirm dose/schedule in your pharmacy ordering system**    Patient Characteristics: Metastatic, Clear Cell, First Line, Intermediate or Poor Risk AJCC M Category: M1 AJCC 8 Stage Grouping: IV Current evidence of distant metastases? Yes AJCC T Category: T3a AJCC N Category: NX Does patient have oligometastatic disease? No Would you be surprised if this patient died  in the next year? I would be surprised if this patient died in the next year Histology: Clear Cell Line of therapy: First Line Risk Status: Intermediate Risk Intent of Therapy: Non-Curative / Palliative Intent, Discussed with Patient

## 2017-07-15 NOTE — Progress Notes (Addendum)
[facility] Initial Visit:  Patient Care Team: Patient, No Pcp Per as PCP - General (General Practice)  CHIEF COMPLAINTS/PURPOSE OF CONSULTATION: I have Right shoulder pain.  HISTORY OF PRESENTING ILLNESS: Larry Watkins 56 y.o. male is here for evaluation of abnormal image finding. Patient has had right shoulder pain  three months after reaching below a bar to grab a glass. Patient is not able to raise his right arm over shoulder level.  Also having left flank pain and 2 weeks ago he has had pink urine for a few days and resolved after taking a course of amoxicillin.  He is otherwise healthy and does not take any medication.  CT scan showed a concerning right kidney mass with possible metastatic disease with thoracic and abdominal lymphadenopathy, lung nodules, and diffuse osseous metastases. Patient has 40 pack year smoking history.    INTERVAL HISTORY Patient is accompanied with his significant other to clinic for discussion of his pathology results. During the interim, he feels progressive SOB and anterior pleuritic chest pain.  His right shoulder pain has improved.   Review of Systems  Constitutional: Negative.   HENT:  Negative.   Eyes: Negative.   Respiratory: Positive for shortness of breath.        Chest pain with deep breathing.   Cardiovascular: Negative.   Gastrointestinal: Negative.   Endocrine: Negative.   Musculoskeletal: Positive for arthralgias and flank pain.  Skin: Negative.   Neurological: Negative.   Hematological: Negative.   Psychiatric/Behavioral: Negative.     MEDICAL HISTORY: History reviewed. No pertinent past medical history.  SURGICAL HISTORY: Past Surgical History:  Procedure Laterality Date  . HERNIA REPAIR    . left middle amputation      with reattachment    SOCIAL HISTORY: Social History   Social History  . Marital status: Single    Spouse name: N/A  . Number of children: N/A  . Years of education: N/A    Occupational History  . Not on file.   Social History Main Topics  . Smoking status: Current Every Day Smoker    Packs/day: 0.50    Years: 38.00  . Smokeless tobacco: Never Used  . Alcohol use No  . Drug use: No  . Sexual activity: Not on file   Other Topics Concern  . Not on file   Social History Narrative  . No narrative on file    FAMILY HISTORY History reviewed. No pertinent family history.  ALLERGIES:  has No Known Allergies.  MEDICATIONS:  Current Outpatient Prescriptions  Medication Sig Dispense Refill  . oxyCODONE-acetaminophen (PERCOCET/ROXICET) 5-325 MG tablet Take 1 tablet by mouth every 6 (six) hours as needed for severe pain. 120 tablet 0   No current facility-administered medications for this visit.    Vitals:   07/15/17 1029  BP: 117/79  Pulse: 85  Temp: 97.7 F (36.5 C)    Filed Weights   07/15/17 1029  Weight: 247 lb 1 oz (112.1 kg)  Physical Exam PHYSICAL EXAMINATION:  ECOG PERFORMANCE STATUS: 0 - Asymptomatic GENERAL: No distress, well nourished.  SKIN:  No rashes or significant lesions  HEAD: Normocephalic, No masses, lesions, tenderness or abnormalities  EYES: Conjunctiva are pink, non icteric ENT: External ears normal ,lips , buccal mucosa, and tongue normal and mucous membranes are moist  LYMPH: No palpable cervical and axillary lymphadenopathy  LUNGS: Clear to auscultation, no crackles or wheezes HEART: Regular rate & rhythm, no murmurs, no gallops, S1 normal and S2  normal  ABDOMEN: Abdomen soft, non-tender, normal bowel sounds, I did not appreciate any  masses or organomegaly  MUSCULOSKELETAL: No CVA tenderness and no tenderness on percussion of the back or rib cage. Right scapular blade palpable mass. EXTREMITIES: No edema, no skin discoloration or tenderness NEURO: Alert & oriented, no focal motor/sensory deficits.  LABORATORY DATA: I have personally reviewed the data as listed: CBC    Component Value Date/Time   WBC 9.9  07/15/2017 1037   RBC 4.70 07/15/2017 1037   HGB 14.5 07/15/2017 1037   HCT 40.9 07/15/2017 1037   PLT 241 07/15/2017 1037   MCV 87.1 07/15/2017 1037   MCH 30.9 07/15/2017 1037   MCHC 35.5 07/15/2017 1037   RDW 12.9 07/15/2017 1037   LYMPHSABS 2.8 07/15/2017 1037   MONOABS 0.8 07/15/2017 1037   EOSABS 0.1 07/15/2017 1037   BASOSABS 0.1 07/15/2017 1037   CMP Latest Ref Rng & Units 07/15/2017 06/26/2017  Glucose 65 - 99 mg/dL 97 104(H)  BUN 6 - 20 mg/dL 18 13  Creatinine 0.61 - 1.24 mg/dL 0.93 0.95  Sodium 135 - 145 mmol/L 137 139  Potassium 3.5 - 5.1 mmol/L 4.0 4.2  Chloride 101 - 111 mmol/L 104 103  CO2 22 - 32 mmol/L 26 30  Calcium 8.9 - 10.3 mg/dL 8.7(L) 9.0  Total Protein 6.5 - 8.1 g/dL 6.6 6.6  Total Bilirubin 0.3 - 1.2 mg/dL 0.5 0.2(L)  Alkaline Phos 38 - 126 U/L 124 111  AST 15 - 41 U/L 14(L) 14(L)  ALT 17 - 63 U/L 15(L) 16(L)    RADIOGRAPHIC STUDIES: I have personally reviewed the radiological images as listed and agree with the findings in the report CT chest abdomen pelvis 06/26/2017  IMPRESSION: 1. 7 cm right lower pole renal mass. This may reflect urothelial carcinoma given involvement of the collecting system versus renal cell carcinoma. 2. Metastatic disease with thoracic and abdominal lymphadenopathy, lung nodules, and diffuse osseous metastases. 3. Subcentimeter lesion in the right upper lobe bronchus with diffuse abnormal right upper lobe bronchial wall soft tissue thickening.    ASSESSMENT/PLAN Cancer Staging Renal cell carcinoma of right kidney [facility]) Staging form: Kidney, AJCC 8th Edition - Clinical stage from 07/14/2017: Stage IV (cT3a, cNX, pM1) - Signed by Earlie Server, MD on 07/15/2017 - Pathologic: No stage assigned - Unsigned  1. 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 SOB (shortness of breath)   3. Chest pain on breathing   4. Collapse of right lung    # I reviewed the pathology results with patient in details.  For his kidney cancer, he does not have high risk factors with [facility] risk stratification model including anemia, hypercalcemia, high neutrophil or platelet counts, poor PS, except he gets one point from diagnosis to treatment less than 1 year which put him to intermediate risk group.  Sunitinib is a standard option while immunotherapy with nivolumab/ipilimumab can be also be considered.  Goal of treatment is palliative, discussed with patient.  .# I am very concerned about his new onset SOB and pleuritic chest pain. Will obtain STAT chest CT PE protocol to rule out PE.  # Prescribed Percocet 5/325 Q4-6 hour PRN for pain control.  # smoke cessation discussed in details.   # addendum:  # Right upper lobe collapse with occlusion CT Chest PE protocol results reviewed. No PE, however there is increase hilar mass and thoracic lymphadenopathy as well as right upper lobe collapse with bronchial occlusion.  Given his extensive smoking history, a second lung neoplasm primary vs renal cell lung metastasis are considered.  Urgent referral to Pulmonary for further evaluation with bronchscopy. He will be seen by Dr. Alva Garnet on 07/17/2017,. Patient was told that if his SOB get worse during the interval, he should go to ER for immediate evaluation.  Will hold his current treatment plan until lung biopsy. Will wait for his lung biopsy result to finalize his treatment plan.   #\07/17/2017 addendum. Patient was seen by Dr. Alva Garnet and he will get bronchoscopy next week. Per my conversation with Dr. Alva Garnet, most likely metastatic renal cell cancer. Given the rapid progression of his disease, plan proceed with the immunotherapy combination with Novolumab and ipilimumab while waiting for further characterization of his lung involvement. Plan to see him tomorrow and sigh up chemotherapy class for him.. Tentatively start chemotherapy therapy next week.  Orders Placed This Encounter  Procedures  . CT ANGIO CHEST PE W OR WO  CONTRAST    Standing Status:   Future    Number of Occurrences:   1    Standing Expiration Date:   10/15/2018    Order Specific Question:   If indicated for the ordered procedure, I authorize the administration of contrast media per Radiology protocol    Answer:   Yes    Order Specific Question:   Preferred imaging location?    Answer:   [facility]    Order Specific Question:   Radiology Contrast Protocol - do NOT remove file path    Answer:   [email or web address]    Order Specific Question:   Reason for Exam additional comments    Answer:   SOB chest pain. kidney cancer r/o PE  . Comprehensive metabolic panel    Standing Status:   Future    Number of Occurrences:   1    Standing Expiration Date:   07/15/2018  . CBC with Differential/Platelet    Standing Status:   Future    Number of Occurrences:   1    Standing Expiration Date:   07/15/2018    All questions were answered. The patient knows to call the clinic with any problems, questions or concerns.Earlie Server, MD  07/15/2017 1:02 PM

## 2017-07-17 ENCOUNTER — Ambulatory Visit (INDEPENDENT_AMBULATORY_CARE_PROVIDER_SITE_OTHER): Payer: Medicaid Other | Admitting: Pulmonary Disease

## 2017-07-17 ENCOUNTER — Inpatient Hospital Stay: Payer: Medicaid Other

## 2017-07-17 ENCOUNTER — Encounter: Payer: Self-pay | Admitting: Pulmonary Disease

## 2017-07-17 ENCOUNTER — Other Ambulatory Visit: Payer: Self-pay | Admitting: Oncology

## 2017-07-17 ENCOUNTER — Encounter: Payer: Self-pay | Admitting: Oncology

## 2017-07-17 VITALS — BP 152/100 | HR 95 | Ht 67.0 in | Wt 242.0 lb

## 2017-07-17 DIAGNOSIS — F17200 Nicotine dependence, unspecified, uncomplicated: Secondary | ICD-10-CM | POA: Diagnosis not present

## 2017-07-17 DIAGNOSIS — C7800 Secondary malignant neoplasm of unspecified lung: Secondary | ICD-10-CM | POA: Diagnosis not present

## 2017-07-17 DIAGNOSIS — C649 Malignant neoplasm of unspecified kidney, except renal pelvis: Secondary | ICD-10-CM | POA: Diagnosis not present

## 2017-07-17 DIAGNOSIS — J9819 Other pulmonary collapse: Secondary | ICD-10-CM

## 2017-07-17 DIAGNOSIS — C7951 Secondary malignant neoplasm of bone: Secondary | ICD-10-CM

## 2017-07-17 HISTORY — DX: Secondary malignant neoplasm of bone: C79.51

## 2017-07-17 NOTE — Patient Instructions (Signed)
Bronchoscopy is scheduled for next Thursday 08/23  Follow-up will be arranged as needed

## 2017-07-17 NOTE — Progress Notes (Signed)
[facility] follow up visit   CHIEF COMPLAINTS/PURPOSE OF CONSULTATION: I have Right shoulder pain.  HISTORY OF PRESENTING ILLNESS: Larry Watkins 56 y.o. male is here for evaluation of abnormal image finding. Patient has had right shoulder pain  three months after reaching below a bar to grab a glass. Patient is not able to raise his right arm over shoulder level.  Also having left flank pain and 2 weeks ago he has had pink urine for a few days and resolved after taking a course of amoxicillin.  He is otherwise healthy and does not take any medication.  CT scan showed a concerning right kidney mass with possible metastatic disease with thoracic and abdominal lymphadenopathy, lung nodules, and diffuse osseous metastases. Patient has 40 pack year smoking history.   07/15/2017 CT PE protocol revealed no PE, however right lung collapse and rapid progression of lung adenopathy.   INTERVAL HISTORY Patient is accompanied with his significant other to clinic for discussion of treatment plan.   he had been seen by Dr. Alva Garnet and has scheduled to have bronchoscopy next week. He reports still having anterior chest pain with deep breath, and right hip pain. He uses Percocet every 4 hours as needed with some relief.   Review of Systems  Constitutional: Negative.   HENT:  Negative.   Eyes: Negative.   Respiratory: Positive for shortness of breath.        Chest pain with deep breathing.   Cardiovascular: Negative.   Gastrointestinal: Negative.   Endocrine: Negative.   Musculoskeletal: Positive for arthralgias and flank pain.  Skin: Negative.   Neurological: Negative.   Hematological: Negative.   Psychiatric/Behavioral: Negative.     MEDICAL HISTORY: Past Medical History:  Diagnosis Date  . Metastatic renal cell carcinoma ([place])     SURGICAL HISTORY: Past Surgical History:  Procedure Laterality Date  . HERNIA REPAIR    . left middle amputation      with reattachment     SOCIAL HISTORY: Social History   Social History  . Marital status: Single    Spouse name: N/A  . Number of children: N/A  . Years of education: N/A   Occupational History  . Not on file.   Social History Main Topics  . Smoking status: Current Every Day Smoker    Packs/day: 0.50    Years: 38.00  . Smokeless tobacco: Never Used  . Alcohol use No  . Drug use: No  . Sexual activity: Not on file   Other Topics Concern  . Not on file   Social History Narrative  . No narrative on file    FAMILY HISTORY No family history on file.  ALLERGIES:  has No Known Allergies.  MEDICATIONS:  Current Outpatient Prescriptions  Medication Sig Dispense Refill  . oxyCODONE-acetaminophen (PERCOCET/ROXICET) 5-325 MG tablet Take 1 tablet by mouth every 6 (six) hours as needed for severe pain. 120 tablet 0   No current facility-administered medications for this visit.    Vitals:   07/18/17 1105  BP: 118/77  Pulse: (!) 102  Temp: 98.4 F (36.9 C)  SpO2: 95%    Filed Weights   07/18/17 1105  Weight: 239 lb (108.4 kg)  Physical Exam ECOG 1 GENERAL: No distress, well nourished.  SKIN:  No rashes or significant lesions  HEAD: Normocephalic, No masses, lesions, tenderness or abnormalities  EYES: Conjunctiva are pink, non icteric ENT: External ears normal ,lips , buccal mucosa, and tongue normal and mucous membranes are  moist  LYMPH: No palpable cervical and axillary lymphadenopathy  LUNGS: Clear to auscultation, no crackles or wheezes HEART: Regular rate & rhythm, no murmurs, no gallops, S1 normal and S2 normal  ABDOMEN: Abdomen soft, non-tender, normal bowel sounds, I did not appreciate any  masses or organomegaly  MUSCULOSKELETAL: No CVA tenderness and no tenderness on percussion of the back or rib cage. Right scapular blade palpable mass. EXTREMITIES: No edema, no skin discoloration or tenderness NEURO: Alert & oriented, no focal motor/sensory deficits.  LABORATORY DATA: I  have personally reviewed the data as listed: CBC    Component Value Date/Time   WBC 9.9 07/15/2017 1037   RBC 4.70 07/15/2017 1037   HGB 14.5 07/15/2017 1037   HCT 40.9 07/15/2017 1037   PLT 241 07/15/2017 1037   MCV 87.1 07/15/2017 1037   MCH 30.9 07/15/2017 1037   MCHC 35.5 07/15/2017 1037   RDW 12.9 07/15/2017 1037   LYMPHSABS 2.8 07/15/2017 1037   MONOABS 0.8 07/15/2017 1037   EOSABS 0.1 07/15/2017 1037   BASOSABS 0.1 07/15/2017 1037   CMP Latest Ref Rng & Units 07/15/2017 06/26/2017  Glucose 65 - 99 mg/dL 97 104(H)  BUN 6 - 20 mg/dL 18 13  Creatinine 0.61 - 1.24 mg/dL 0.93 0.95  Sodium 135 - 145 mmol/L 137 139  Potassium 3.5 - 5.1 mmol/L 4.0 4.2  Chloride 101 - 111 mmol/L 104 103  CO2 22 - 32 mmol/L 26 30  Calcium 8.9 - 10.3 mg/dL 8.7(L) 9.0  Total Protein 6.5 - 8.1 g/dL 6.6 6.6  Total Bilirubin 0.3 - 1.2 mg/dL 0.5 0.2(L)  Alkaline Phos 38 - 126 U/L 124 111  AST 15 - 41 U/L 14(L) 14(L)  ALT 17 - 63 U/L 15(L) 16(L)    RADIOGRAPHIC STUDIES: I have personally reviewed the radiological images as listed and agree with the findings in the report CT chest abdomen pelvis 06/26/2017  IMPRESSION: 1. 7 cm right lower pole renal mass. This may reflect urothelial carcinoma given involvement of the collecting system versus renal cell carcinoma. 2. Metastatic disease with thoracic and abdominal lymphadenopathy, lung nodules, and diffuse osseous metastases. 3. Subcentimeter lesion in the right upper lobe bronchus with diffuse abnormal right upper lobe bronchial wall soft tissue thickening.  07/15/2017 No evidence of pulmonary emboli. Attenuation of the pulmonary arterial branches is noted related to hilar adenopathy.  Increase in the size of the central right hilar mass with increase in mediastinal and hilar adenopathy when compared with the recent exam.  New right upper lobe collapse secondary to bronchial occlusion. Additionally some patchy infiltrative changes noted in  the lower lobe likely related to the acute infiltrate.  Diffuse bony metastatic disease with evidence of pathologic fracture involving the left tenth rib lesion.  ASSESSMENT/PLAN Cancer Staging Renal cell carcinoma of right kidney [facility]) Staging form: Kidney, AJCC 8th Edition - Clinical stage from 07/14/2017: Stage IV (cT3a, cNX, pM1) - Signed by Earlie Server, MD on 07/15/2017 - Pathologic: No stage assigned - Unsigned  1. 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 Metastasis to bone ([place])   3. Tobacco abuse   4. Collapse of right lung     CT Chest PE protocol results reviewed. No PE, however there is increase hilar mass and thoracic lymphadenopathy as well as right upper lobe collapse with bronchial occlusion. Patient was seen by Dr. Alva Garnet and he will get bronchoscopy next week. Per my conversation with Dr. Alva Garnet, most likely metastatic renal  cell cancer. Given the rapid progression of his disease, plan proceed with the immunotherapy combination with Novolumab and ipilimumab while waiting for further characterization of his lung involvement.  # I discussed the mechanism of action; The goal of therapy is palliative; and length of treatments are likely ongoing/based upon the results of the scans. Discussed the potential side effects of immunotherapy including but not limited to diarrhea; skin rash; elevated LFTs/endocrine abnormalities etc. # Chemotherapy class, tentatively starting treatment next week.  #CBC CMP TSH on day 1 of each cycle.   # Add Xgeva for metastatic bone disease.  # Refer to radiation oncology for palliative RT for symptom control.  # increase pain medication regimen to Percocet 10/325 Q4-6 hour PRN for pain control.  ideally I want to start long-acting narcotics. However patient does not have insurance and long-acting is more expensive. Patient is scheduled to see case manager for application for Medicaid. Currently he pays out of his pocket  for pain medication. Patient wants to stay with short acting but higher dosage until he gets his insurance.  # smoke cessation discussed in details.    Orders Placed This Encounter  Procedures  . CBC with Differential    Standing Status:   Standing    Number of Occurrences:   20    Standing Expiration Date:   07/19/2018  . Comprehensive metabolic panel    Standing Status:   Standing    Number of Occurrences:   20    Standing Expiration Date:   07/19/2018  . TSH    Standing Status:   Standing    Number of Occurrences:   20    Standing Expiration Date:   07/18/2018  . Ambulatory referral to Radiation Oncology    Referral Priority:   Routine    Referral Type:   Consultation    Referral Reason:   Specialty Services Required    Referred to Provider:   Noreene Filbert, MD    Requested Specialty:   Radiation Oncology    Number of Visits Requested:   1   Follow-up next week on day 1 of treatment.  All questions were answered. The patient knows to call the clinic with any problems, questions or concerns.Earlie Server, MD  07/17/2017 10:43 PM

## 2017-07-17 NOTE — Addendum Note (Signed)
Addended by: Earlie Server on: 07/17/2017 12:03 PM   Modules accepted: Orders

## 2017-07-18 ENCOUNTER — Telehealth: Payer: Self-pay | Admitting: *Deleted

## 2017-07-18 ENCOUNTER — Emergency Department: Payer: Self-pay

## 2017-07-18 ENCOUNTER — Inpatient Hospital Stay (HOSPITAL_BASED_OUTPATIENT_CLINIC_OR_DEPARTMENT_OTHER): Payer: Medicaid Other | Admitting: Oncology

## 2017-07-18 ENCOUNTER — Emergency Department
Admission: EM | Admit: 2017-07-18 | Discharge: 2017-07-18 | Disposition: A | Discharge status: Home / Self Care | Payer: Self-pay | Attending: Emergency Medicine | Admitting: Emergency Medicine

## 2017-07-18 ENCOUNTER — Encounter: Payer: Self-pay | Admitting: Oncology

## 2017-07-18 VITALS — BP 118/77 | HR 102 | Temp 98.4°F | Wt 239.0 lb

## 2017-07-18 DIAGNOSIS — R109 Unspecified abdominal pain: Secondary | ICD-10-CM

## 2017-07-18 DIAGNOSIS — Z720 Tobacco use: Secondary | ICD-10-CM

## 2017-07-18 DIAGNOSIS — F17210 Nicotine dependence, cigarettes, uncomplicated: Secondary | ICD-10-CM

## 2017-07-18 DIAGNOSIS — C7800 Secondary malignant neoplasm of unspecified lung: Secondary | ICD-10-CM

## 2017-07-18 DIAGNOSIS — R079 Chest pain, unspecified: Secondary | ICD-10-CM

## 2017-07-18 DIAGNOSIS — M25511 Pain in right shoulder: Secondary | ICD-10-CM

## 2017-07-18 DIAGNOSIS — C7951 Secondary malignant neoplasm of bone: Secondary | ICD-10-CM | POA: Diagnosis not present

## 2017-07-18 DIAGNOSIS — C799 Secondary malignant neoplasm of unspecified site: Principal | ICD-10-CM

## 2017-07-18 DIAGNOSIS — R0602 Shortness of breath: Secondary | ICD-10-CM

## 2017-07-18 DIAGNOSIS — M129 Arthropathy, unspecified: Secondary | ICD-10-CM | POA: Diagnosis not present

## 2017-07-18 DIAGNOSIS — F17200 Nicotine dependence, unspecified, uncomplicated: Secondary | ICD-10-CM | POA: Insufficient documentation

## 2017-07-18 DIAGNOSIS — M25512 Pain in left shoulder: Secondary | ICD-10-CM | POA: Insufficient documentation

## 2017-07-18 DIAGNOSIS — J9811 Atelectasis: Secondary | ICD-10-CM | POA: Diagnosis not present

## 2017-07-18 DIAGNOSIS — C641 Malignant neoplasm of right kidney, except renal pelvis: Secondary | ICD-10-CM | POA: Diagnosis not present

## 2017-07-18 DIAGNOSIS — C649 Malignant neoplasm of unspecified kidney, except renal pelvis: Secondary | ICD-10-CM

## 2017-07-18 MED ORDER — OXYCODONE-ACETAMINOPHEN 10-325 MG PO TABS: 1.0000 | ORAL_TABLET | Freq: Four times a day (QID) | ORAL | 0 refills | Status: DC | PRN

## 2017-07-18 MED ORDER — OXYCODONE-ACETAMINOPHEN 7.5-325 MG PO TABS
1.0000 | ORAL_TABLET | Freq: Once | ORAL | Status: AC
  Administered 2017-07-18: 1 via ORAL
  Filled 2017-07-18: qty 1

## 2017-07-18 MED ORDER — LIDOCAINE-PRILOCAINE 2.5-2.5 % EX CREA: TOPICAL_CREAM | CUTANEOUS | 3 refills | Status: DC

## 2017-07-18 NOTE — ED Triage Notes (Addendum)
Pt reports hearing left shoulder pop x1 hour ago while lifting arm. States he had immediate severe pain. Reports 10/10 pain right now. Good radial pulse and sensation in left hand. Pt has known fracture right shoulder. Pt states recently diagnosed with cancer x2 weeks ago.

## 2017-07-18 NOTE — Telephone Encounter (Signed)
S/O called to report that Mr Larry Watkins had a loud "POP" in his other shoulder and is in tears with pain. I returned her call and she said that they just arrived to the ER

## 2017-07-18 NOTE — ED Provider Notes (Addendum)
[facility] Emergency Department Provider Note   ____________________________________________   First MD Initiated Contact with Patient 07/18/17 1630     (approximate)  I have reviewed the triage vital signs and the nursing notes.   HISTORY  Chief Complaint Shoulder Pain    HPI Larry Watkins is a 56 y.o. male patient reports he was trying to pull the wrist band from the hospital off of his right wrist and felt a pop in his shoulder. He experienced immediate severe pain. He has severe pain now. Pain is much worse with movement. Pain is worse on palpation anywhere around the shoulder and anterior lateral or posterior. Movement also makes it worse.   Past Medical History:  Diagnosis Date  . Bone metastasis ([place]) 07/17/2017  . Bone metastasis ([place]) 07/17/2017  . Metastatic renal cell carcinoma [facility])     Patient Active Problem List   Diagnosis Date Noted  . Metastatic renal cell carcinoma ([place]) 07/17/2017  . Bone metastasis ([place]) 07/17/2017  . Collapse of right lung 07/15/2017  . Renal cell carcinoma of right kidney ([place]) 07/14/2017    Past Surgical History:  Procedure Laterality Date  . HERNIA REPAIR    . left middle amputation      with reattachment    Prior to Admission medications   Medication Sig Start Date End Date Taking? Authorizing Provider  lidocaine-prilocaine (EMLA) cream Apply to affected area once 07/18/17   Earlie Server, MD  oxyCODONE-acetaminophen (PERCOCET) 10-325 MG tablet Take 1 tablet by mouth every 6 (six) hours as needed for pain. 07/18/17   Earlie Server, MD    Allergies Patient has no known allergies.  No family history on file.  Social History Social History  Substance Use Topics  . Smoking status: Current Every Day Smoker    Packs/day: 0.50    Years: 38.00  . Smokeless tobacco: Never Used  . Alcohol use No    Review of Systems  Constitutional: No fever/chills Eyes: No visual changes. ENT: No sore  throat. Cardiovascular: Denies chest pain. Respiratory: Denies shortness of breath. Gastrointestinal: No abdominal pain.  No nausea, no vomiting.  No diarrhea.  No constipation. Genitourinary: Negative for dysuria. Musculoskeletal: Negative for back pain. Skin: Negative for rash. Neurological: Negative for headaches, focal weakness   ____________________________________________   PHYSICAL EXAM:  VITAL SIGNS: ED Triage Vitals  Enc Vitals Group     BP 07/18/17 1516 140/87     Pulse Rate 07/18/17 1516 87     Resp 07/18/17 1516 18     Temp 07/18/17 1516 98.1 F (36.7 C)     Temp Source 07/18/17 1516 Oral     SpO2 07/18/17 1516 95 %     Weight 07/18/17 1519 239 lb (108.4 kg)     Height 07/18/17 1519 5\' 7"  (1.702 m)     Head Circumference --      Peak Flow --      Pain Score 07/18/17 1516 10     Pain Loc --      Pain Edu? --      Excl. in [place]? --     Constitutional: Alert and oriented. Well appearing and in no acute distress. Eyes: Conjunctivae are normal. Head: Atraumatic. Nose: No congestion/rhinnorhea. Mouth/Throat: Mucous membranes are moist.  Oropharynx non-erythematous. Neck: No stridor.  Cardiovascular: Normal rate, regular rhythm.Kermit Balo peripheral circulation. Respiratory: Normal respiratory effort.  No retractions.  Gastrointestinal: Soft and nontender. No distention. No abdominal bruits. No CVA tenderness. Musculoskeletal: No  lower extremity tenderness nor edema.  Normal distal pulses and capillary refill. Normal sensation in the hand and in the axillary nerve area on the shoulder Neurologic:  Normal speech and language. No gross focal neurologic deficits are appreciated.  Skin:  Skin is warm, dry and intact. No rash noted. Psychiatric: Mood and affect are normal. Speech and behavior are normal.  ____________________________________________   LABS (all labs ordered are listed, but only abnormal results are displayed)  Labs Reviewed - No data to  display ____________________________________________  EKG  ________________________________________  RADIOLOGY  Dg Shoulder Left  Result Date: 07/18/2017 CLINICAL DATA:  Patient reports he rolled over while laying on the couch today and felt a "pop" in his left shoulder. Reports severe pain to anterior and superior surface of left shoulder. Reports previous injuries to left shoulder but no previous surgeries. EXAM: LEFT SHOULDER - 2+ VIEW COMPARISON:  None. FINDINGS: There is no fracture or dislocation. There is mild osteoarthritis of the left glenohumeral joint. There are mild degenerative changes of the acromioclavicular joint. IMPRESSION: 1. Mild osteoarthritis of the left glenohumeral joint. 2.  No acute osseous injury of the left shoulder. Electronically Signed   By: Kathreen Devoid   On: 07/18/2017 15:56    ____________________________________________   PROCEDURES  Procedure(s) performed:   Procedures  Critical Care performed:   ____________________________________________   INITIAL IMPRESSION / ASSESSMENT AND PLAN / ED COURSE  Pertinent labs & imaging results that were available during my care of the patient were reviewed by me and considered in my medical decision making (see chart for details).  Patient had a similar thing happen to his other arm other shoulder or should say fairly recently. That shoulders apparently working well now. Patient does have a new diagnosis of cancer in his kidney with bony metastases. No bony metastases are seen on the x-ray. Patient is following up with his cancer doctor to [place]. I discussed the patient with Dr. Youlanda Mighty he will see him in the office next week recommends a shoulder mobilizer and pain meds in the meantime.  Computer indicates patient just got a prescription for Percocet and will have him use those instead of giving him a new one.      ____________________________________________   FINAL CLINICAL IMPRESSION(S) / ED  DIAGNOSES  Final diagnoses:  Acute pain of left shoulder      NEW MEDICATIONS STARTED DURING THIS VISIT:  New Prescriptions   No medications on file     Note:  This document was prepared using Dragon voice recognition software and may include unintentional dictation errors.    Nena Polio, MD 07/18/17 1715    Nena Polio, MD 07/18/17 [phone]

## 2017-07-18 NOTE — Discharge Instructions (Signed)
Wear the shoulder immobilizer. Use the oxycodone one pill 4 times a day as needed for pain. You can also use some Motrin with the oxycodone if needed. Take the Motrin with food so it does not upset her stomach and make sure you drinking plenty of water. Be careful the oxycodone can make you woozy do not drive while you're taking it. It can also make you constipated.      Call Dr. Rudene Christians, the orthopedic surgeon, in the morning or on Monday and schedule an appointment this coming week. Please return for weakness numbness or clumsiness in the hand or worsening pain. Also return if the headache gets blue or cold.

## 2017-07-18 NOTE — Addendum Note (Signed)
Addended by: Earlie Server on: 07/18/2017 04:05 PM   Modules accepted: Orders

## 2017-07-18 NOTE — Patient Instructions (Signed)
Nivolumab injection What is this medicine? NIVOLUMAB (nye VOL ue mab) is a monoclonal antibody. It is used to treat melanoma, lung cancer, kidney cancer, head and neck cancer, Hodgkin lymphoma, urothelial cancer, colon cancer, and liver cancer. This medicine may be used for other purposes; ask your health care provider or pharmacist if you have questions. COMMON BRAND NAME(S): Opdivo What should I tell my health care provider before I take this medicine? They need to know if you have any of these conditions: -diabetes -immune system problems -kidney disease -liver disease -lung disease -organ transplant -stomach or intestine problems -thyroid disease -an unusual or allergic reaction to nivolumab, other medicines, foods, dyes, or preservatives -pregnant or trying to get pregnant -breast-feeding How should I use this medicine? This medicine is for infusion into a vein. It is given by a health care professional in a hospital or clinic setting. A special MedGuide will be given to you before each treatment. Be sure to read this information carefully each time. Talk to your pediatrician regarding the use of this medicine in children. While this drug may be prescribed for children as young as 12 years for selected conditions, precautions do apply. Overdosage: If you think you have taken too much of this medicine contact a poison control center or emergency room at once. NOTE: This medicine is only for you. Do not share this medicine with others. What if I miss a dose? It is important not to miss your dose. Call your doctor or health care professional if you are unable to keep an appointment. What may interact with this medicine? Interactions have not been studied. Give your health care provider a list of all the medicines, herbs, non-prescription drugs, or dietary supplements you use. Also tell them if you smoke, drink alcohol, or use illegal drugs. Some items may interact with your  medicine. This list may not describe all possible interactions. Give your health care provider a list of all the medicines, herbs, non-prescription drugs, or dietary supplements you use. Also tell them if you smoke, drink alcohol, or use illegal drugs. Some items may interact with your medicine. What should I watch for while using this medicine? This drug may make you feel generally unwell. Continue your course of treatment even though you feel ill unless your doctor tells you to stop. You may need blood work done while you are taking this medicine. Do not become pregnant while taking this medicine or for 5 months after stopping it. Women should inform their doctor if they wish to become pregnant or think they might be pregnant. There is a potential for serious side effects to an unborn child. Talk to your health care professional or pharmacist for more information. Do not breast-feed an infant while taking this medicine. What side effects may I notice from receiving this medicine? Side effects that you should report to your doctor or health care professional as soon as possible: -allergic reactions like skin rash, itching or hives, swelling of the face, lips, or tongue -black, tarry stools -blood in the urine -bloody or watery diarrhea -changes in vision -change in sex drive -changes in emotions or moods -chest pain -confusion -cough -decreased appetite -diarrhea -facial flushing -feeling faint or lightheaded -fever, chills -hair loss -hallucination, loss of contact with reality -headache -irritable -joint pain -loss of memory -muscle pain -muscle weakness -seizures -shortness of breath -signs and symptoms of high blood sugar such as dizziness; dry mouth; dry skin; fruity breath; nausea; stomach pain; increased hunger or thirst; increased   urination -signs and symptoms of kidney injury like trouble passing urine or change in the amount of urine -signs and symptoms of liver injury  like dark yellow or brown urine; general ill feeling or flu-like symptoms; light-colored stools; loss of appetite; nausea; right upper belly pain; unusually weak or tired; yellowing of the eyes or skin -stiff neck -swelling of the ankles, feet, hands -weight gain Side effects that usually do not require medical attention (report to your doctor or health care professional if they continue or are bothersome): -bone pain -constipation -tiredness -vomiting This list may not describe all possible side effects. Call your doctor for medical advice about side effects. You may report side effects to FDA at 1-800-FDA-1088. Where should I keep my medicine? This drug is given in a hospital or clinic and will not be stored at home. NOTE: This sheet is a summary. It may not cover all possible information. If you have questions about this medicine, talk to your doctor, pharmacist, or health care provider.  2018 Elsevier/Gold Standard (2016-08-26 17:49:34) Ipilimumab injection What is this medicine? IPILIMUMAB (IP i LIM ue mab) is a monoclonal antibody. It is used to treat melanoma, a type of skin cancer. This medicine may be used for other purposes; ask your health care provider or pharmacist if you have questions. COMMON BRAND NAME(S): YERVOY What should I tell my health care provider before I take this medicine? They need to know if you have any of these conditions: -Addison's disease -blood in your stools (black or tarry stools) or if you have blood in your vomit -eye disease, vision problems -history of pancreatitis -history of stomach bleeding -immune system problems -inflammatory bowel disease -kidney disease -liver disease -lupus -myasthenia gravis -organ transplant -rheumatoid arthritis -sarcoidosis -stomach or intestine problems -thyroid disease -tingling of the fingers or toes, or other nerve disorder -an unusual or allergic reaction to ipilimumab, other medicines, foods, dyes, or  preservatives -pregnant or trying to get pregnant -breast-feeding How should I use this medicine? This medicine is for infusion into a vein. It is given by a health care professional in a hospital or clinic setting. A special MedGuide will be given to you before each treatment. Be sure to read this information carefully each time. Talk to your pediatrician regarding the use of this medicine in children. While this drug may be prescribed for children as young as 12 years for selected conditions, precautions do apply. Overdosage: If you think you have taken too much of this medicine contact a poison control center or emergency room at once. NOTE: This medicine is only for you. Do not share this medicine with others. What if I miss a dose? It is important not to miss your dose. Call your doctor or health care professional if you are unable to keep an appointment. What may interact with this medicine? Interactions are not expected. This list may not describe all possible interactions. Give your health care provider a list of all the medicines, herbs, non-prescription drugs, or dietary supplements you use. Also tell them if you smoke, drink alcohol, or use illegal drugs. Some items may interact with your medicine. What should I watch for while using this medicine? Tell your doctor or healthcare professional if your symptoms do not start to get better or if they get worse. Do not become pregnant while taking this medicine or for 3 months after stopping it. Women should inform their doctor if they wish to become pregnant or think they might be pregnant. There is   a potential for serious side effects to an unborn child. Talk to your health care professional or pharmacist for more information. Do not breast-feed an infant while taking this medicine or for 3 months after the last dose. Your condition will be monitored carefully while you are receiving this medicine. You may need blood work done while you are  taking this medicine. What side effects may I notice from receiving this medicine? Side effects that you should report to your doctor or health care professional as soon as possible: -allergic reactions like skin rash, itching or hives, swelling of the face, lips, or tongue -black, tarry stools -bloody or watery diarrhea -changes in vision -dizziness -eye pain -fast, irregular heartbeat -feeling anxious -feeling faint or lightheaded, falls -nausea, vomiting -pain, tingling, numbness in the hands or feet -redness, blistering, peeling or loosening of the skin, including inside the mouth -signs and symptoms of liver injury like dark yellow or brown urine; general ill feeling or flu-like symptoms; light-colored stools; loss of appetite; nausea; right upper belly pain; unusually weak or tired; yellowing of the eyes or skin -unusual bleeding or bruising Side effects that usually do not require medical attention (report to your doctor or health care professional if they continue or are bothersome): -headache -loss of appetite -trouble sleeping This list may not describe all possible side effects. Call your doctor for medical advice about side effects. You may report side effects to FDA at 1-800-FDA-1088. Where should I keep my medicine? This drug is given in a hospital or clinic and will not be stored at home. NOTE: This sheet is a summary. It may not cover all possible information. If you have questions about this medicine, talk to your doctor, pharmacist, or health care provider.  2018 Elsevier/Gold Standard (2016-06-25 11:41:46)  

## 2017-07-18 NOTE — Progress Notes (Signed)
Patient here today for follow up.  Patient c/o pain in chest with movement

## 2017-07-19 ENCOUNTER — Encounter: Payer: Self-pay | Admitting: Pulmonary Disease

## 2017-07-19 NOTE — Progress Notes (Signed)
PULMONARY CONSULT NOTE  Requesting MD/Service: Yu Date of initial consultation: 07/17/17 Reason for consultation: Lung mass  PT PROFILE: 56 y.o. male M with recently diagnosed RCC and new finding of RUL collapse  HPI:  As above. He initially presented with right shoulder pain. He underwent a plain film of his right shoulder with findings as documented below. This prompted an evaluation looking for a primary malignancy. The evaluation as documented below. He underwent biopsy of the right iliac crest with findings of metastatic carcinoma compatible with RCC. He developed progressive dyspnea which prompted the second CT chest. Dr. Yu is concerned about the possibility of a second malignancy and has requested bronchoscopy for evaluation of right upper lobe collapse. The patient has had no hemoptysis. He has diffuse pain including chest pain. He continues to smoke.   06/26/17 right shoulder x-ray: Pathologic fracture seen involving right scapular blade inferiorly secondary to lytic mass in this area highly concerning for metastatic disease. MRI recommended. 06/26/17 CT CHEST ABDOMEN PELVIS: 7 cm right lower pole renal mass, metastatic disease with thoracic and abdominal LA and, lung nodules, diffuse osseous metastases. Right upper lobe bronchus nodule with diffuse abnormal right upper lobe bronchial wall soft tissue thickening 07/10/17 right iliac crest biopsy: Metastatic carcinoma compatible with RCC 07/15/17 CT chest: Increased size of the central right hilar mass with increase in mediastinal and hilar adenopathy when compared to recent exam. New right upper lobe collapse secondary to bronchial occlusion. Diffuse bony metastatic disease with evidence of pathological fracture involving the left 10th rib   Past Medical History:  Diagnosis Date  . Bone metastasis ([facility]) 07/17/2017  . Bone metastasis ([facility]) 07/17/2017  . Metastatic renal cell carcinoma ([facility])     Past Surgical History:  Procedure  Laterality Date  . HERNIA REPAIR    . left middle amputation      with reattachment    MEDICATIONS: I have reviewed all medications and confirmed regimen as documented  Social History   Social History  . Marital status: Single    Spouse name: N/A  . Number of children: N/A  . Years of education: N/A   Occupational History  . Not on file.   Social History Main Topics  . Smoking status: Current Every Day Smoker    Packs/day: 0.50    Years: 38.00  . Smokeless tobacco: Never Used  . Alcohol use No  . Drug use: No  . Sexual activity: Not on file   Other Topics Concern  . Not on file   Social History Narrative  . No narrative on file    History reviewed. No pertinent family history.  ROS: No fever, myalgias/arthralgias, unexplained weight loss or weight gain No new focal weakness or sensory deficits No otalgia, hearing loss, visual changes, nasal and sinus symptoms, mouth and throat problems No neck pain or adenopathy No abdominal pain, N/V/D, diarrhea, change in bowel pattern No dysuria, change in urinary pattern   Vitals:   07/17/17 1052 07/17/17 1054  BP:  (!) 152/100  Pulse:  95  SpO2:  95%  Weight: 242 lb (109.8 kg)   Height: 5' 7" (1.702 m)      EXAM:  Gen: WDWN, No overt respiratory distress HEENT: NCAT, sclera white, oropharynx normal Neck: Supple without LAN, thyromegaly, JVD Lungs: breath sounds diminished in right upper lobe, no adventitious sounds Cardiovascular: Reg, no murmurs noted Abdomen: Soft, nontender, normal BS Ext: without clubbing, cyanosis, edema Neuro: CNs grossly intact, motor and sensory intact Skin: Limited exam,   no lesions noted  DATA:   BMP Latest Ref Rng & Units 07/15/2017 06/26/2017  Glucose 65 - 99 mg/dL 97 104(H)  BUN 6 - 20 mg/dL 18 13  Creatinine 0.61 - 1.24 mg/dL 0.93 0.95  Sodium 135 - 145 mmol/L 137 139  Potassium 3.5 - 5.1 mmol/L 4.0 4.2  Chloride 101 - 111 mmol/L 104 103  CO2 22 - 32 mmol/L 26 30  Calcium  8.9 - 10.3 mg/dL 8.7(L) 9.0    CBC Latest Ref Rng & Units 07/15/2017 07/10/2017 06/26/2017  WBC 3.8 - 10.6 K/uL 9.9 7.6 8.0  Hemoglobin 13.0 - 18.0 g/dL 14.5 14.8 15.5  Hematocrit 40.0 - 52.0 % 40.9 42.9 45.0  Platelets 150 - 440 K/uL 241 251 231    CXR:  As above.  IMPRESSION:     ICD-10-CM   1. Lung collapse J98.19   2. Renal cell carcinoma, unspecified laterality (HCC) C64.9   3. 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 Smoker F17.200    Dr Yu has raised concern for possible primary lung Ca (synchronous malignancy) and requests FOB  PLAN:  Bronchoscopy is scheduled for next Thursday 08/23 - We discussed the procedure, indications, risks and alternatives in detail. After a detailed discussion, he had the opportunity to ask questions and has agreed to proceed.  I have discussed with Dr. Yu. I emphasized that this malignancy appears to be progressing very rapidly and I believe treatment should not be delayed while waiting for bronchoscopy or bronchoscopic biopsy results.  Follow-up will be arranged as needed   Caira Poche, MD PCCM service Mobile [phone] Pager [phone] 07/19/2017 2:06 PM   

## 2017-07-21 NOTE — Progress Notes (Signed)
[facility] follow up visit  Date of visit: 07/22/17 CHIEF COMPLAINTS/PURPOSE OF CONSULTATION: Evaluation before cycle 1 Nivolumab and Ipilimumab  HISTORY OF PRESENTING ILLNESS: 1[name] 56 y.o. male with 40 pack year smoking histroy is here for evaluation of abnormal image finding. Patient has had right shoulder pain  three months after reaching below a bar to grab a glass. Patient is not able to raise his right arm over shoulder level.  Also having left flank pain and 2 weeks ago he has had pink urine for a few days and resolved after taking a course of amoxicillin. He is otherwise healthy and does not take any medication.  CT scan showed a concerning right kidney mass with possible metastatic disease with thoracic and abdominal lymphadenopathy, lung nodules, and diffuse osseous metastases. Patient has 40 pack year smoking history.    2 07/15/2017 CT PE protocol for evaluation of shortness of breath or chest pain showed no PE, however right lung collapse and rapid progression of lung adenopathy.   3 Plan Bronchoscopy on 07/24/2017   INTERVAL HISTORY Patient is accompanied with his significant other to clinic for evaluation prior to starting his immunotherapy.  He uses Percocet every 4 hours as needed with some relief.   Review of Systems  Constitutional: Negative.   HENT:  Negative.   Eyes: Negative.   Respiratory: Positive for shortness of breath.        Chest pain with deep breathing.   Cardiovascular: Negative.   Gastrointestinal: Negative.   Endocrine: Negative.   Musculoskeletal: Positive for arthralgias and flank pain.  Skin: Negative.   Neurological: Negative.   Hematological: Negative.   Psychiatric/Behavioral: Negative.     MEDICAL HISTORY: Past Medical History:  Diagnosis Date  . Bone metastasis ([place]) 07/17/2017  . Bone metastasis ([place]) 07/17/2017  . Metastatic renal cell carcinoma ([place])     SURGICAL HISTORY: Past Surgical History:   Procedure Laterality Date  . HERNIA REPAIR    . left middle amputation      with reattachment    SOCIAL HISTORY: Social History   Social History  . Marital status: Single    Spouse name: N/A  . Number of children: N/A  . Years of education: N/A   Occupational History  . Not on file.   Social History Main Topics  . Smoking status: Current Every Day Smoker    Packs/day: 0.50    Years: 38.00  . Smokeless tobacco: Never Used  . Alcohol use No  . Drug use: No  . Sexual activity: Not on file   Other Topics Concern  . Not on file   Social History Narrative  . No narrative on file    FAMILY HISTORY No family history on file.  ALLERGIES:  has No Known Allergies.  MEDICATIONS:  Current Outpatient Prescriptions  Medication Sig Dispense Refill  . lidocaine-prilocaine (EMLA) cream Apply to affected area once 30 g 3  . oxyCODONE-acetaminophen (PERCOCET) 10-325 MG tablet Take 1 tablet by mouth every 6 (six) hours as needed for pain. 120 tablet 0   No current facility-administered medications for this visit.    There were no vitals filed for this visit.  There were no vitals filed for this visit.Physical Exam ECOG 1 GENERAL: No distress, well nourished.  SKIN:  No rashes or significant lesions  HEAD: Normocephalic, No masses, lesions, tenderness or abnormalities  EYES: Conjunctiva are pink, non icteric ENT: External ears normal ,lips , buccal mucosa, and tongue normal and  mucous membranes are moist  LYMPH: No palpable cervical and axillary lymphadenopathy  LUNGS: Clear to auscultation, no crackles or wheezes HEART: Regular rate & rhythm, no murmurs, no gallops, S1 normal and S2 normal  ABDOMEN: Abdomen soft, non-tender, normal bowel sounds, I did not appreciate any  masses or organomegaly  MUSCULOSKELETAL: No CVA tenderness and no tenderness on percussion of the back or rib cage. Right scapular blade palpable mass. EXTREMITIES: No edema, no skin discoloration or  tenderness NEURO: Alert & oriented, no focal motor/sensory deficits.  LABORATORY DATA: I have personally reviewed the data as listed: CBC    Component Value Date/Time   WBC 9.9 07/15/2017 1037   RBC 4.70 07/15/2017 1037   HGB 14.5 07/15/2017 1037   HCT 40.9 07/15/2017 1037   PLT 241 07/15/2017 1037   MCV 87.1 07/15/2017 1037   MCH 30.9 07/15/2017 1037   MCHC 35.5 07/15/2017 1037   RDW 12.9 07/15/2017 1037   LYMPHSABS 2.8 07/15/2017 1037   MONOABS 0.8 07/15/2017 1037   EOSABS 0.1 07/15/2017 1037   BASOSABS 0.1 07/15/2017 1037   CMP Latest Ref Rng & Units 07/15/2017 06/26/2017  Glucose 65 - 99 mg/dL 97 104(H)  BUN 6 - 20 mg/dL 18 13  Creatinine 0.61 - 1.24 mg/dL 0.93 0.95  Sodium 135 - 145 mmol/L 137 139  Potassium 3.5 - 5.1 mmol/L 4.0 4.2  Chloride 101 - 111 mmol/L 104 103  CO2 22 - 32 mmol/L 26 30  Calcium 8.9 - 10.3 mg/dL 8.7(L) 9.0  Total Protein 6.5 - 8.1 g/dL 6.6 6.6  Total Bilirubin 0.3 - 1.2 mg/dL 0.5 0.2(L)  Alkaline Phos 38 - 126 U/L 124 111  AST 15 - 41 U/L 14(L) 14(L)  ALT 17 - 63 U/L 15(L) 16(L)    RADIOGRAPHIC STUDIES: I have personally reviewed the radiological images as listed and agree with the findings in the report CT chest abdomen pelvis 06/26/2017  IMPRESSION: 1. 7 cm right lower pole renal mass. This may reflect urothelial carcinoma given involvement of the collecting system versus renal cell carcinoma. 2. Metastatic disease with thoracic and abdominal lymphadenopathy, lung nodules, and diffuse osseous metastases. 3. Subcentimeter lesion in the right upper lobe bronchus with diffuse abnormal right upper lobe bronchial wall soft tissue thickening.  07/15/2017 No evidence of pulmonary emboli. Attenuation of the pulmonary arterial branches is noted related to hilar adenopathy. Increase in the size of the central right hilar mass with increase in mediastinal and hilar adenopathy when compared with the recent exam. New right upper lobe collapse  secondary to bronchial occlusion.Additionally some patchy infiltrative changes noted in the lower lobe likely related to the acute infiltrate. Diffuse bony metastatic disease with evidence of pathologic fracture involving the left tenth rib lesion.  ASSESSMENT/PLAN Cancer Staging Renal cell carcinoma of right kidney [facility]) Staging form: Kidney, AJCC 8th Edition - Clinical stage from 07/14/2017: Stage IV (cT3a, cNX, pM1) - Signed by Earlie Server, MD on 07/15/2017 - Pathologic: No stage assigned - Unsigned  1. Renal cell carcinoma of right kidney metastatic to other site [facility]   2. Collapse of right lung   3. Tobacco abuse   4. Metastasis to bone (HCC)   5. Chest pain on breathing   6. Goals of care, counseling/discussion     # Okay to proceed with cycle 1. CBC CMP, TSH prior to each cycle of treatment. # I discussed the mechanism of action; The goal of therapy is palliative; and length of treatments are likely ongoing/based upon  the results of the scans. Discussed the potential side effects of immunotherapy including but not limited to diarrhea; skin rash; elevated LFTs/endocrine abnormalities etc.Patient has been to chemotherapy class.  #  Xgeva every 28 days for metastatic bone disease.   # Refer to radiation oncology for palliative RT for symptom control.   # Pain medication regimen to Percocet 10/325 Q4-6 hour PRN for pain control.  ideally I want to start long-acting narcotics. However patient does not have insurance and long-acting is more expensive. Patient is scheduled to see case manager for application for Medicaid. Currently he pays out of his pocket for pain medication. Patient wants to stay with short acting but higher dosage until he gets his insurance.  # smoke cessation discussed in details.    No orders of the defined types were placed in this encounter.  Follow-up on 07/30/2017  to access of pain control.  All questions were answered. The patient knows to call the clinic with  any problems, questions or concerns.Earlie Server, MD  07/21/2017 10:14 AM

## 2017-07-22 ENCOUNTER — Encounter: Payer: Self-pay | Admitting: Oncology

## 2017-07-22 ENCOUNTER — Inpatient Hospital Stay (HOSPITAL_BASED_OUTPATIENT_CLINIC_OR_DEPARTMENT_OTHER): Payer: Medicaid Other | Admitting: Oncology

## 2017-07-22 ENCOUNTER — Inpatient Hospital Stay: Payer: Medicaid Other

## 2017-07-22 ENCOUNTER — Ambulatory Visit
Admission: RE | Admit: 2017-07-22 | Discharge: 2017-07-22 | Disposition: A | Discharge status: Home / Self Care | Payer: Medicaid Other | Source: Ambulatory Visit | Attending: Radiation Oncology | Admitting: Radiation Oncology

## 2017-07-22 ENCOUNTER — Other Ambulatory Visit: Payer: Self-pay

## 2017-07-22 ENCOUNTER — Inpatient Hospital Stay: Payer: Medicaid Other | Admitting: Oncology

## 2017-07-22 ENCOUNTER — Other Ambulatory Visit: Payer: Self-pay | Admitting: *Deleted

## 2017-07-22 VITALS — BP 134/85 | HR 88 | Temp 97.1°F | Resp 18 | Wt 241.0 lb

## 2017-07-22 DIAGNOSIS — C7800 Secondary malignant neoplasm of unspecified lung: Secondary | ICD-10-CM

## 2017-07-22 DIAGNOSIS — Z7189 Other specified counseling: Secondary | ICD-10-CM

## 2017-07-22 DIAGNOSIS — C641 Malignant neoplasm of right kidney, except renal pelvis: Secondary | ICD-10-CM

## 2017-07-22 DIAGNOSIS — F17210 Nicotine dependence, cigarettes, uncomplicated: Secondary | ICD-10-CM

## 2017-07-22 DIAGNOSIS — J9811 Atelectasis: Secondary | ICD-10-CM | POA: Insufficient documentation

## 2017-07-22 DIAGNOSIS — R0602 Shortness of breath: Secondary | ICD-10-CM

## 2017-07-22 DIAGNOSIS — M129 Arthropathy, unspecified: Secondary | ICD-10-CM | POA: Diagnosis not present

## 2017-07-22 DIAGNOSIS — C649 Malignant neoplasm of unspecified kidney, except renal pelvis: Secondary | ICD-10-CM | POA: Insufficient documentation

## 2017-07-22 DIAGNOSIS — Z720 Tobacco use: Secondary | ICD-10-CM

## 2017-07-22 DIAGNOSIS — M25512 Pain in left shoulder: Secondary | ICD-10-CM | POA: Diagnosis not present

## 2017-07-22 DIAGNOSIS — R071 Chest pain on breathing: Secondary | ICD-10-CM

## 2017-07-22 DIAGNOSIS — C7951 Secondary malignant neoplasm of bone: Secondary | ICD-10-CM | POA: Diagnosis not present

## 2017-07-22 DIAGNOSIS — R079 Chest pain, unspecified: Secondary | ICD-10-CM

## 2017-07-22 DIAGNOSIS — R109 Unspecified abdominal pain: Secondary | ICD-10-CM

## 2017-07-22 DIAGNOSIS — C799 Secondary malignant neoplasm of unspecified site: Principal | ICD-10-CM

## 2017-07-22 DIAGNOSIS — M25511 Pain in right shoulder: Secondary | ICD-10-CM | POA: Diagnosis not present

## 2017-07-22 DIAGNOSIS — Z510 Encounter for antineoplastic radiation therapy: Secondary | ICD-10-CM | POA: Diagnosis present

## 2017-07-22 LAB — CBC WITH DIFFERENTIAL/PLATELET
BASOS ABS: 0 10*3/uL (ref 0–0.1)
BASOS PCT: 1 %
EOS ABS: 0.1 10*3/uL (ref 0–0.7)
EOS PCT: 2 %
HCT: 42.2 % (ref 40.0–52.0)
Hemoglobin: 15 g/dL (ref 13.0–18.0)
LYMPHS ABS: 1.9 10*3/uL (ref 1.0–3.6)
Lymphocytes Relative: 27 %
MCH: 30.8 pg (ref 26.0–34.0)
MCHC: 35.6 g/dL (ref 32.0–36.0)
MCV: 86.7 fL (ref 80.0–100.0)
Monocytes Absolute: 0.4 10*3/uL (ref 0.2–1.0)
Monocytes Relative: 6 %
Neutro Abs: 4.6 10*3/uL (ref 1.4–6.5)
Neutrophils Relative %: 64 %
PLATELETS: 237 10*3/uL (ref 150–440)
RBC: 4.87 MIL/uL (ref 4.40–5.90)
RDW: 13 % (ref 11.5–14.5)
WBC: 7.1 10*3/uL (ref 3.8–10.6)

## 2017-07-22 LAB — COMPREHENSIVE METABOLIC PANEL
ALBUMIN: 3.4 g/dL — AB (ref 3.5–5.0)
ALK PHOS: 127 U/L — AB (ref 38–126)
ALT: 16 U/L — AB (ref 17–63)
AST: 16 U/L (ref 15–41)
Anion gap: 7 (ref 5–15)
BUN: 11 mg/dL (ref 6–20)
CALCIUM: 9 mg/dL (ref 8.9–10.3)
CHLORIDE: 104 mmol/L (ref 101–111)
CO2: 26 mmol/L (ref 22–32)
CREATININE: 0.91 mg/dL (ref 0.61–1.24)
GFR calc non Af Amer: >60 mL/min (ref >60)
GLUCOSE: 140 mg/dL — AB (ref 65–99)
Potassium: 3.6 mmol/L (ref 3.5–5.1)
SODIUM: 137 mmol/L (ref 135–145)
Total Bilirubin: 0.7 mg/dL (ref 0.3–1.2)
Total Protein: 7.2 g/dL (ref 6.5–8.1)

## 2017-07-22 LAB — TSH: TSH: 5.191 u[IU]/mL — ABNORMAL HIGH (ref 0.350–4.500)

## 2017-07-22 LAB — T4, FREE: FREE T4: 0.87 ng/dL (ref 0.61–1.12)

## 2017-07-22 MED ORDER — SODIUM CHLORIDE 0.9 % IV SOLN
100.0000 mg | Freq: Once | INTRAVENOUS | Status: AC
  Administered 2017-07-22: 100 mg via INTRAVENOUS
  Filled 2017-07-22: qty 20

## 2017-07-22 MED ORDER — SODIUM CHLORIDE 0.9 % IV SOLN
Freq: Once | INTRAVENOUS | Status: AC
  Administered 2017-07-22: 12:00:00 via INTRAVENOUS
  Filled 2017-07-22: qty 1000

## 2017-07-22 MED ORDER — SODIUM CHLORIDE 0.9 % IV SOLN
340.0000 mg | Freq: Once | INTRAVENOUS | Status: AC
  Administered 2017-07-22: 340 mg via INTRAVENOUS
  Filled 2017-07-22: qty 10

## 2017-07-22 NOTE — Progress Notes (Signed)
Here for follow up

## 2017-07-22 NOTE — Consult Note (Signed)
NEW PATIENT EVALUATION  Name: Larry Watkins  MRN: [id number]  Date:   07/22/2017     DOB: 08/04/1961   This 56 y.o. male patient presents to the clinic for initial evaluation of stage IV renal cell carcinoma with bone and lung metastasis.  REFERRING PHYSICIAN: Earlie Server, MD  CHIEF COMPLAINT: No chief complaint on file.   DIAGNOSIS: The encounter diagnosis was Bone metastasis ([place]).   PREVIOUS INVESTIGATIONS:  Plain film CT scans all reviewed Pathology report reviewed Clinical notes reviewed  HPI: Patient is a 56 year old male 40 pack year smoking history who presented to emergency room with right shoulder pain. He is at all also multiple other areas of pain including his left flank as well as his left shoulder. Workup including CT scan showed a right kidney mass with thoracic and abdominal lymphadenopathy as well as lung nodules and diffuse bony metastasis. Also CT scan of his chest showed right lung collapse with rapid progression of lung adenopathy. 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 He is scheduled for bronchoscopy later this week to determine whether this is a different tumor in his chest or simply all 1 disease. His major complaint today is significant left shoulder pain he states he felt like something popped after he reached for an tool. Plain films of his left shoulder showed no evidence of pathologic fracture although on my review of the CT scan the acromion process does have metastatic disease close to the cortex which may have fractured. He is referred today for consideration of palliative treatment.  PLANNED TREATMENT REGIMEN: Palliative treatment to his upper thoracic spine and left shoulder as well as possible treatment to his collapsed right lung  PAST MEDICAL HISTORY:  has a past medical history of Bone metastasis ([place]) (07/17/2017); Bone metastasis ([place]) (07/17/2017); and  Metastatic renal cell carcinoma ([place]).    PAST SURGICAL HISTORY:  Past Surgical History:  Procedure Laterality Date  . HERNIA REPAIR    . left middle amputation      with reattachment    FAMILY HISTORY: family history is not on file.  SOCIAL HISTORY:  reports that he has been smoking.  He has a 19.00 pack-year smoking history. He has never used smokeless tobacco. He reports that he does not drink alcohol or use drugs.  ALLERGIES: Patient has no known allergies.  MEDICATIONS:  Current Outpatient Prescriptions  Medication Sig Dispense Refill  . lidocaine-prilocaine (EMLA) cream Apply to affected area once (Patient not taking: Reported on 07/22/2017) 30 g 3  . oxyCODONE-acetaminophen (PERCOCET) 10-325 MG tablet Take 1 tablet by mouth every 6 (six) hours as needed for pain. 120 tablet 0   No current facility-administered medications for this encounter.    Facility-Administered Medications Ordered in Other Encounters  Medication Dose Route Frequency Provider Last Rate Last Dose  . ipilimumab (YERVOY) 100 mg in sodium chloride 0.9 % 50 mL chemo infusion  100 mg Intravenous Once Earlie Server, MD   100 mg at 07/22/17 1311    ECOG PERFORMANCE STATUS:  1 - Symptomatic but completely ambulatory  REVIEW OF SYSTEMS: Except for the pain in his shoulders and other areas of pain Patient denies any weight loss, fatigue, weakness, fever, chills or night sweats. Patient denies any loss of vision, blurred vision. Patient denies any ringing  of the ears or hearing loss. No irregular heartbeat. Patient denies heart murmur or history of fainting. Patient denies any  chest pain or pain radiating to her upper extremities. Patient denies any shortness of breath, difficulty breathing at night, cough or hemoptysis. Patient denies any swelling in the lower legs. Patient denies any nausea vomiting, vomiting of blood, or coffee ground material in the vomitus. Patient denies any stomach pain. Patient states has had normal  bowel movements no significant constipation or diarrhea. Patient denies any dysuria, hematuria or significant nocturia. Patient denies any problems walking, swelling in the joints or loss of balance. Patient denies any skin changes, loss of hair or loss of weight. Patient denies any excessive worrying or anxiety or significant depression. Patient denies any problems with insomnia. Patient denies excessive thirst, polyuria, polydipsia. Patient denies any swollen glands, patient denies easy bruising or easy bleeding. Patient denies any recent infections, allergies or URI. Patient "s visual fields have not changed significantly in recent time.    PHYSICAL EXAM: There were no vitals taken for this visit. A well-developed slightly obese male in NAD. He has very limited range of motion his left upper extremity. Motor sensory and DTR levels are equal and symmetric in the upper lower extremities. Well-developed well-nourished patient in NAD. HEENT reveals PERLA, EOMI, discs not visualized.  Oral cavity is clear. No oral mucosal lesions are identified. Neck is clear without evidence of cervical or supraclavicular adenopathy. Lungs are clear to A&P. Cardiac examination is essentially unremarkable with regular rate and rhythm without murmur rub or thrill. Abdomen is benign with no organomegaly or masses noted. Motor sensory and DTR levels are equal and symmetric in the upper and lower extremities. Cranial nerves II through XII are grossly intact. Proprioception is intact. No peripheral adenopathy or edema is identified. No motor or sensory levels are noted. Crude visual fields are within normal range.  LABORATORY DATA: Pathology reports reviewed    RADIOLOGY RESULTS: CT scans and plain films reviewed   IMPRESSION: Stage IV renal cell carcinoma in 56 year old male  PLAN: At this time I to go ahead with palliative radiation therapy I can possibly include his spine as well as his left shoulder and treat up to 3000  cGy in 10 fractions. I will await bronchoscopy results before going ahead with palliative radiation therapy to his chest. Patient is oriented and started on systemic treatment by medical oncology and we can treat concurrently at this point. Risks and benefits of treatment including skin reaction fatigue alteration of blood counts possible dysphasia all were discussed in detail with the patient.There will be extra effort by both professional staff as well as technical staff to coordinate and manage concurrent chemoradiation and ensuing side effects during his treatments. I first dissected ordered CT simulation for later this week. I discussed the case personally with medical oncology.  I would like to take this opportunity to thank you for allowing me to participate in the care of your patient.Armstead Peaks., MD

## 2017-07-23 ENCOUNTER — Other Ambulatory Visit: Payer: Self-pay

## 2017-07-24 ENCOUNTER — Ambulatory Visit: Payer: Medicaid Other

## 2017-07-24 ENCOUNTER — Other Ambulatory Visit: Payer: Self-pay

## 2017-07-24 ENCOUNTER — Encounter
Admission: RE | Disposition: A | Discharge status: Home / Self Care | Payer: Self-pay | Source: Ambulatory Visit | Attending: Pulmonary Disease

## 2017-07-24 ENCOUNTER — Encounter: Payer: Self-pay | Admitting: *Deleted

## 2017-07-24 ENCOUNTER — Ambulatory Visit
Admission: RE | Admit: 2017-07-24 | Discharge: 2017-07-24 | Disposition: A | Discharge status: Home / Self Care | Payer: Medicaid Other | Source: Ambulatory Visit | Attending: Pulmonary Disease | Admitting: Pulmonary Disease

## 2017-07-24 ENCOUNTER — Telehealth: Payer: Self-pay | Admitting: *Deleted

## 2017-07-24 DIAGNOSIS — C7801 Secondary malignant neoplasm of right lung: Secondary | ICD-10-CM | POA: Diagnosis not present

## 2017-07-24 DIAGNOSIS — F17210 Nicotine dependence, cigarettes, uncomplicated: Secondary | ICD-10-CM | POA: Insufficient documentation

## 2017-07-24 DIAGNOSIS — R918 Other nonspecific abnormal finding of lung field: Secondary | ICD-10-CM | POA: Diagnosis not present

## 2017-07-24 DIAGNOSIS — C649 Malignant neoplasm of unspecified kidney, except renal pelvis: Secondary | ICD-10-CM | POA: Diagnosis present

## 2017-07-24 DIAGNOSIS — C7951 Secondary malignant neoplasm of bone: Secondary | ICD-10-CM | POA: Insufficient documentation

## 2017-07-24 DIAGNOSIS — C779 Secondary and unspecified malignant neoplasm of lymph node, unspecified: Secondary | ICD-10-CM | POA: Diagnosis not present

## 2017-07-24 HISTORY — PX: FLEXIBLE BRONCHOSCOPY: SHX5094

## 2017-07-24 SURGERY — BRONCHOSCOPY, FLEXIBLE
Anesthesia: Moderate Sedation

## 2017-07-24 MED ORDER — FENTANYL CITRATE (PF) 100 MCG/2ML IJ SOLN
INTRAMUSCULAR | Status: AC
  Filled 2017-07-24: qty 4

## 2017-07-24 MED ORDER — HYDROCOD POLST-CPM POLST ER 10-8 MG/5ML PO SUER
ORAL | Status: AC
  Filled 2017-07-24: qty 5

## 2017-07-24 MED ORDER — LIDOCAINE HCL 2 % EX GEL: 1.0000 "application " | Freq: Once | CUTANEOUS | Status: DC

## 2017-07-24 MED ORDER — BUTAMBEN-TETRACAINE-BENZOCAINE 2-2-14 % EX AERO
1.0000 | INHALATION_SPRAY | Freq: Once | CUTANEOUS | Status: DC
  Filled 2017-07-24: qty 20

## 2017-07-24 MED ORDER — PHENYLEPHRINE HCL 0.25 % NA SOLN
1.0000 | Freq: Four times a day (QID) | NASAL | Status: DC | PRN
  Filled 2017-07-24: qty 15

## 2017-07-24 MED ORDER — FENTANYL CITRATE (PF) 100 MCG/2ML IJ SOLN
INTRAMUSCULAR | Status: AC | PRN
  Administered 2017-07-24: 50 ug via INTRAVENOUS
  Administered 2017-07-24 (×2): 25 ug via INTRAVENOUS

## 2017-07-24 MED ORDER — HYDROCOD POLST-CPM POLST ER 10-8 MG/5ML PO SUER
5.0000 mL | ORAL | Status: AC
  Administered 2017-07-24: 5 mL via ORAL

## 2017-07-24 MED ORDER — MIDAZOLAM HCL 5 MG/5ML IJ SOLN
INTRAMUSCULAR | Status: AC | PRN
  Administered 2017-07-24: 4 mg via INTRAVENOUS
  Administered 2017-07-24: 1 mg via INTRAVENOUS

## 2017-07-24 MED ORDER — OXYCODONE-ACETAMINOPHEN 10-325 MG PO TABS: 1.0000 | ORAL_TABLET | Freq: Four times a day (QID) | ORAL | 0 refills | Status: DC | PRN

## 2017-07-24 MED ORDER — MORPHINE SULFATE ER 15 MG PO TBCR: 15.0000 mg | EXTENDED_RELEASE_TABLET | Freq: Two times a day (BID) | ORAL | 0 refills | Status: DC

## 2017-07-24 MED ORDER — MIDAZOLAM HCL 5 MG/5ML IJ SOLN
INTRAMUSCULAR | Status: AC
  Filled 2017-07-24: qty 10

## 2017-07-24 NOTE — H&P (View-Only) (Signed)
PULMONARY CONSULT NOTE  Requesting MD/Service: Tasia Catchings Date of initial consultation: 07/17/17 Reason for consultation: Lung mass  PT PROFILE: 56 y.o. male M with recently diagnosed RCC and new finding of RUL collapse  HPI:  As above. He initially presented with right shoulder pain. He underwent a plain film of his right shoulder with findings as documented below. This prompted an evaluation looking for a primary malignancy. The evaluation as documented below. He underwent biopsy of the right iliac crest with findings of metastatic carcinoma compatible with RCC. He developed progressive dyspnea which prompted the second CT chest. Dr. Tasia Catchings is concerned about the possibility of a second malignancy and has requested bronchoscopy for evaluation of right upper lobe collapse. The patient has had no hemoptysis. He has diffuse pain including chest pain. He continues to smoke.   06/26/17 right shoulder x-ray: Pathologic fracture seen involving right scapular blade inferiorly secondary to lytic mass in this area highly concerning for metastatic disease. MRI recommended. 06/26/17 CT CHEST ABDOMEN PELVIS: 7 cm right lower pole renal mass, metastatic disease with thoracic and abdominal LA and, lung nodules, diffuse osseous metastases. Right upper lobe bronchus nodule with diffuse abnormal right upper lobe bronchial wall soft tissue thickening 07/10/17 right iliac crest biopsy: Metastatic carcinoma compatible with RCC 07/15/17 CT chest: Increased size of the central right hilar mass with increase in mediastinal and hilar adenopathy when compared to recent exam. New right upper lobe collapse secondary to bronchial occlusion. Diffuse bony metastatic disease with evidence of pathological fracture involving the left 10th rib   Past Medical History:  Diagnosis Date  . 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 Bone metastasis ([place]) 07/17/2017  . Metastatic renal cell carcinoma [facility])     Past Surgical History:  Procedure  Laterality Date  . HERNIA REPAIR    . left middle amputation      with reattachment    MEDICATIONS: I have reviewed all medications and confirmed regimen as documented  Social History   Social History  . Marital status: Single    Spouse name: N/A  . Number of children: N/A  . Years of education: N/A   Occupational History  . Not on file.   Social History Main Topics  . Smoking status: Current Every Day Smoker    Packs/day: 0.50    Years: 38.00  . Smokeless tobacco: Never Used  . Alcohol use No  . Drug use: No  . Sexual activity: Not on file   Other Topics Concern  . Not on file   Social History Narrative  . No narrative on file    History reviewed. No pertinent family history.  ROS: No fever, myalgias/arthralgias, unexplained weight loss or weight gain No new focal weakness or sensory deficits No otalgia, hearing loss, visual changes, nasal and sinus symptoms, mouth and throat problems No neck pain or adenopathy No abdominal pain, N/V/D, diarrhea, change in bowel pattern No dysuria, change in urinary pattern   Vitals:   07/17/17 1052 07/17/17 1054  BP:  (!) 152/100  Pulse:  95  SpO2:  95%  Weight: 242 lb (109.8 kg)   Height: 5\' 7"  (1.702 m)      EXAM:  Gen: WDWN, No overt respiratory distress HEENT: NCAT, sclera white, oropharynx normal Neck: Supple without LAN, thyromegaly, JVD Lungs: breath sounds diminished in right upper lobe, no adventitious sounds Cardiovascular: Reg, no murmurs noted Abdomen: Soft, nontender, normal BS Ext: without clubbing, cyanosis, edema Neuro: CNs grossly intact, motor and sensory intact Skin: Limited exam,  no lesions noted  DATA:   BMP Latest Ref Rng & Units 07/15/2017 06/26/2017  Glucose 65 - 99 mg/dL 97 104(H)  BUN 6 - 20 mg/dL 18 13  Creatinine 0.61 - 1.24 mg/dL 0.93 0.95  Sodium 135 - 145 mmol/L 137 139  Potassium 3.5 - 5.1 mmol/L 4.0 4.2  Chloride 101 - 111 mmol/L 104 103  CO2 22 - 32 mmol/L 26 30  Calcium  8.9 - 10.3 mg/dL 8.7(L) 9.0    CBC Latest Ref Rng & Units 07/15/2017 07/10/2017 06/26/2017  WBC 3.8 - 10.6 K/uL 9.9 7.6 8.0  Hemoglobin 13.0 - 18.0 g/dL 14.5 14.8 15.5  Hematocrit 40.0 - 52.0 % 40.9 42.9 45.0  Platelets 150 - 440 K/uL 241 251 231    CXR:  As above.  IMPRESSION:     ICD-10-CM   1. Lung collapse J98.19   2. Renal cell carcinoma, unspecified laterality (HCC) C64.9   3. Malignant neoplasm metastatic to lung, unspecified laterality ([place]) C78.00   4. Smoker F17.200    Dr Tasia Catchings has raised concern for possible primary lung Ca (synchronous malignancy) and requests FOB  PLAN:  Bronchoscopy is scheduled for next Thursday 08/23 - We discussed the procedure, indications, risks and alternatives in detail. After a detailed discussion, he had the opportunity to ask questions and has agreed to proceed.  I have discussed with Dr. Tasia Catchings. I emphasized that this malignancy appears to be progressing very rapidly and I believe treatment should not be delayed while waiting for bronchoscopy or bronchoscopic biopsy results.  Follow-up will be arranged as needed   Merton Border, MD PCCM service Mobile [phone] Pager [phone] 07/19/2017 2:06 PM

## 2017-07-24 NOTE — Procedures (Signed)
Indication: Obstructing tumoor in RUL  Premedication:   Fentanyl 75 mcg in increments Midaz 5 mg in increments  Anesthesia: Topical to nose and throat 50 cc of 1% lidocaine used during the course of procedure  Procedure: After adequate sedation and anesthesia, the bronchoscope was introduced via the R naris and advanced into the posterior pharynx. Further anesthesia was obtained with 1% lidocaine and the scope was advanced into the trachea. Complete airway anesthesia was achieved with 1% lidocaine and a thorough airway examination was performed. This revealed the following findings:  Findings:  Upper airway - normal Tracheobronchial anatomy - normal anatomic arrangement Bronchial mucosa - diffusely edematous and mildly erythematous Other - tumor in proximal RUL bronchus wit 100% obstruction  Specimens:   Cytology brushings from RUL Endobronchial biopsy from RUL Washings from R mainstem  Complications: Excessive cough Mild to moderate bleeding  Post procedure evaluation:  The patient tolerated the procedure well with no major complications CXR - not indicated   Merton Border, MD PCCM service Mobile [phone] Pager [phone]  07/24/2017 2:16 PM

## 2017-07-24 NOTE — Interval H&P Note (Signed)
History and Physical Interval Note:  07/24/2017 2:12 PM  Larry Watkins  has presented today for surgery, with the diagnosis of Regular Bronchoscopy   Atelectasis Sent email to [place]  The various methods of treatment have been discussed with the patient and family. After consideration of risks, benefits and other options for treatment, the patient has consented to  Procedure(s): FLEXIBLE BRONCHOSCOPY (N/A) as a surgical intervention .  The patient's history has been reviewed, patient examined, no change in status, stable for surgery.  I have reviewed the patient's chart and labs.  Questions were answered to the patient's satisfaction.     [name]

## 2017-07-24 NOTE — Telephone Encounter (Signed)
Patient had nurse call for a refill to be picked up on his way home form bronchoscopy today. Patient just had #120 tabs filled on 07/18/17 of Oxycodone 10/325 mg. Per Dr Tasia Catchings, start patient on MS ER 15 mg twice a day #14 and refill Oxycodone for 1 week #28 tabs  I spoke with Lavena Stanford, CSW and he has agreed to cover his pain medications at [facility].  Phone call to patient who has left the specials dept for home and explained to him that he cannot take medications more than prescribed, he must contact our office and let our doctors handle his pain control. He stated I have been hurting and I will take it if I have it. I advised that he will now only get a one week supply of medicine at a time as result of this and that if he runs out early and has not called our office for the doctor advising to increase dose,that we will not prescribe more for him early. He stated he understands. I let him know that we will cover his new prescription for a weeks worth long acting medicine and a weeks worth of short acting medicine to be filled at [facility] but he has to come here and pick up prescription to take there. He voiced understanding

## 2017-07-24 NOTE — Progress Notes (Signed)
Patient complaining of "not feeling well". Complains of chest pressure. Denies SOB. EKG performed, MD notified.

## 2017-07-24 NOTE — Op Note (Signed)
[facility] Patient Name: Larry Watkins Procedure Date: 07/24/2017 1:21 PM MRN: [id number] Account #: [id number] Date of Birth: Jun 11, 1961 Admit Type: Outpatient Age: 56 Room: [facility] PROCEDURE RM 01 Gender: Male Note Status: Finalized Attending MD: [name] , MD Procedure:         Bronchoscopy Indications:       Right upper lobe mass Providers:         Zella Richer. Naviah Belfield, MD Referring MD:       Medicines:         Lidocaine applied to nares and subglottic space, Midazolam                     5 mg IV, Fentanyl 75 mcg IV Complications:     Minor hemorrhage which stopped spontaneously Procedure:         Pre-Anesthesia Assessment:                    - A History and Physical has been performed. The patient's                     medications, allergies and sensitivities have been                     reviewed.                    After obtaining informed consent, the bronchoscope was                     passed under direct vision. Throughout the procedure, the                     patient's blood pressure, pulse, and oxygen saturations                     were monitored continuously. the Bronchoscope Olympus                     [id number] was introduced through the right                     nostril and advanced to the tracheobronchial tree of both                     lungs. The patient tolerated the procedure well. Findings:      The nasopharynx/oropharynx appears normal. The larynx appears normal.       The vocal cords appear normal. The subglottic space is normal. The       trachea is of normal caliber. The carina is sharp. The tracheobronchial       tree was examined to at least the first subsegmental level. Bronchial       mucosa and anatomy are normal; there are no endobronchial lesions, and       no secretions.      Bilateral Lung Abnormalities: Edema was found throughout the       tracheobronchial tree. Erythema was found throughout the   tracheobronchial tree. A completely obstructing mass was found       proximally, at the orifice in the right upper lobe. The mass was       endobronchial and smooth.      Brushings of a mass were obtained in the right upper lobe with a       cytology brush and sent for routine  cytology. Two samples were obtained.      Endobronchial biopsies of a mass were performed in the right upper lobe       using a forceps and sent for histopathology examination. Two samples       were obtained.      Washings were obtained in the right mainstem bronchus and sent for       routine cytology. The return was bloody. Impression:        - Right upper lobe mass                    - The examination was normal.                    - Edema was present throughout the tracheobronchial tree.                    - Erythema was present throughout the tracheobronchial                     tree.                    - An endobronchial and smooth mass was found in the right                     upper lobe. This lesion is malignant.                    - Brushings were obtained.                    - An endobronchial biopsy was performed.                    - Washings were obtained. Recommendation:    - Await test results. Dr. Merton Border, MD [name], MD 07/24/2017 2:08:47 PM This report has been signed electronically. Number of Addenda: 0 Note Initiated On: 07/24/2017 1:21 PM      [facility]

## 2017-07-25 ENCOUNTER — Telehealth: Payer: Self-pay | Admitting: Pulmonary Disease

## 2017-07-25 ENCOUNTER — Encounter: Payer: Self-pay | Admitting: Pulmonary Disease

## 2017-07-25 LAB — CYTOLOGY - NON PAP

## 2017-07-25 NOTE — Telephone Encounter (Signed)
Pt good friend, whom he lives with, states pt eyes are red and puffy. She would like to know if this is normal. Pt had bronchoscopy yesterday.

## 2017-07-25 NOTE — Telephone Encounter (Signed)
Explained to patient's friend that per Dr. Alva Garnet red eyes is due to excessive amount of coughing patient did after procedure. This should resolve on its own. Nothing further needed at this time.

## 2017-07-28 ENCOUNTER — Inpatient Hospital Stay: Payer: Medicaid Other

## 2017-07-28 ENCOUNTER — Institutional Professional Consult (permissible substitution): Payer: Self-pay | Admitting: Radiation Oncology

## 2017-07-28 ENCOUNTER — Inpatient Hospital Stay: Payer: Medicaid Other | Admitting: Oncology

## 2017-07-28 LAB — SURGICAL PATHOLOGY

## 2017-07-28 LAB — CYTOLOGY - NON PAP

## 2017-07-29 ENCOUNTER — Telehealth: Payer: Self-pay | Admitting: Pulmonary Disease

## 2017-07-29 NOTE — Telephone Encounter (Signed)
Pt friend calling stating they missed a call from our office.  Please call back

## 2017-07-29 NOTE — Telephone Encounter (Signed)
Patient would like tussinex rx called in for continued cough s/p bronch   Please send to [place]

## 2017-07-29 NOTE — Telephone Encounter (Signed)
Returned call Informed that not sure where call came from. They is not a message in patient's chart. Patient has appt tomorrow with Dr. Tasia Catchings. Patient still awaiting results of bronchoscopy.

## 2017-07-29 NOTE — Telephone Encounter (Signed)
This medicine cannot be called in as it is a narcotic/opioid. I spoke with him and advised using 5 mg oxycodone which he has for pain as needed for severe cough. He sees oncologist 07/30/17  Merton Border, MD PCCM service Mobile [phone] Pager [phone] 07/29/2017 9:02 PM

## 2017-07-30 ENCOUNTER — Encounter: Payer: Self-pay | Admitting: Oncology

## 2017-07-30 ENCOUNTER — Ambulatory Visit
Admission: RE | Admit: 2017-07-30 | Discharge: 2017-07-30 | Disposition: A | Discharge status: Home / Self Care | Payer: Medicaid Other | Source: Ambulatory Visit | Attending: Radiation Oncology | Admitting: Radiation Oncology

## 2017-07-30 ENCOUNTER — Inpatient Hospital Stay: Payer: Medicaid Other

## 2017-07-30 ENCOUNTER — Inpatient Hospital Stay (HOSPITAL_BASED_OUTPATIENT_CLINIC_OR_DEPARTMENT_OTHER): Payer: Medicaid Other | Admitting: Oncology

## 2017-07-30 VITALS — BP 141/90 | HR 85 | Temp 97.8°F | Resp 16 | Ht 67.0 in | Wt 240.6 lb

## 2017-07-30 DIAGNOSIS — Z720 Tobacco use: Secondary | ICD-10-CM

## 2017-07-30 DIAGNOSIS — R079 Chest pain, unspecified: Secondary | ICD-10-CM | POA: Diagnosis not present

## 2017-07-30 DIAGNOSIS — M25511 Pain in right shoulder: Secondary | ICD-10-CM | POA: Diagnosis not present

## 2017-07-30 DIAGNOSIS — J9811 Atelectasis: Secondary | ICD-10-CM | POA: Diagnosis not present

## 2017-07-30 DIAGNOSIS — C7800 Secondary malignant neoplasm of unspecified lung: Secondary | ICD-10-CM | POA: Diagnosis not present

## 2017-07-30 DIAGNOSIS — R109 Unspecified abdominal pain: Secondary | ICD-10-CM

## 2017-07-30 DIAGNOSIS — G893 Neoplasm related pain (acute) (chronic): Secondary | ICD-10-CM | POA: Diagnosis not present

## 2017-07-30 DIAGNOSIS — C649 Malignant neoplasm of unspecified kidney, except renal pelvis: Secondary | ICD-10-CM

## 2017-07-30 DIAGNOSIS — C641 Malignant neoplasm of right kidney, except renal pelvis: Secondary | ICD-10-CM | POA: Diagnosis not present

## 2017-07-30 DIAGNOSIS — F17210 Nicotine dependence, cigarettes, uncomplicated: Secondary | ICD-10-CM | POA: Diagnosis not present

## 2017-07-30 DIAGNOSIS — M129 Arthropathy, unspecified: Secondary | ICD-10-CM | POA: Diagnosis not present

## 2017-07-30 DIAGNOSIS — C7951 Secondary malignant neoplasm of bone: Secondary | ICD-10-CM | POA: Diagnosis not present

## 2017-07-30 DIAGNOSIS — Z7189 Other specified counseling: Secondary | ICD-10-CM

## 2017-07-30 DIAGNOSIS — R0602 Shortness of breath: Secondary | ICD-10-CM

## 2017-07-30 DIAGNOSIS — Z510 Encounter for antineoplastic radiation therapy: Secondary | ICD-10-CM | POA: Diagnosis not present

## 2017-07-30 LAB — COMPREHENSIVE METABOLIC PANEL
ALBUMIN: 3.7 g/dL (ref 3.5–5.0)
ALT: 18 U/L (ref 17–63)
ANION GAP: 7 (ref 5–15)
AST: 17 U/L (ref 15–41)
Alkaline Phosphatase: 148 U/L — ABNORMAL HIGH (ref 38–126)
BILIRUBIN TOTAL: 0.4 mg/dL (ref 0.3–1.2)
BUN: 15 mg/dL (ref 6–20)
CO2: 30 mmol/L (ref 22–32)
Calcium: 9.2 mg/dL (ref 8.9–10.3)
Chloride: 99 mmol/L — ABNORMAL LOW (ref 101–111)
Creatinine, Ser: 0.99 mg/dL (ref 0.61–1.24)
GFR calc Af Amer: >60 mL/min (ref >60)
GLUCOSE: 104 mg/dL — AB (ref 65–99)
POTASSIUM: 4.9 mmol/L (ref 3.5–5.1)
Sodium: 136 mmol/L (ref 135–145)
TOTAL PROTEIN: 7.7 g/dL (ref 6.5–8.1)

## 2017-07-30 MED ORDER — OXYCODONE-ACETAMINOPHEN 10-325 MG PO TABS: 1.0000 | ORAL_TABLET | Freq: Four times a day (QID) | ORAL | 0 refills | Status: DC | PRN

## 2017-07-30 MED ORDER — MORPHINE SULFATE ER 30 MG PO TBCR: 30.0000 mg | EXTENDED_RELEASE_TABLET | Freq: Two times a day (BID) | ORAL | 0 refills | Status: DC

## 2017-07-30 NOTE — Progress Notes (Signed)
Patient here for follow up. He has his simulation today. His pain level is a 10 today and he needs a refill for his oxycontin.

## 2017-07-30 NOTE — Progress Notes (Signed)
[facility] follow up visit  Date of visit: 07/30/17 CHIEF COMPLAINTS/PURPOSE OF CONSULTATION: Evaluation before cycle 1 Nivolumab and Ipilimumab  HISTORY OF PRESENTING ILLNESS: 1[name] 56 y.o. male with 40 pack year smoking histroy is here for evaluation of abnormal image finding. Patient has had right shoulder pain  three months after reaching below a bar to grab a glass. Patient is not able to raise his right arm over shoulder level.  Also having left flank pain and 2 weeks ago he has had pink urine for a few days and resolved after taking a course of amoxicillin. He is otherwise healthy and does not take any medication.  CT scan showed a concerning right kidney mass with possible metastatic disease with thoracic and abdominal lymphadenopathy, lung nodules, and diffuse osseous metastases. Patient has 40 pack year smoking history.    2 07/15/2017 CT PE protocol for evaluation of shortness of breath or chest pain showed no PE, however right lung collapse and rapid progression of lung adenopathy.   3 Bronchoscopy on 07/24/2017 and biopsy revealed metastatic renal cell cancer.    INTERVAL HISTORY Patient is accompanied with his significant other to clinic for evaluation for evaluation of tolerance of immunotherapy and pain control. Previously he was given one month's supply of percocet and he finished in one week. Therefore he was given pain medication supply weekly to prevent overdose. I started him on MS contin 29m BID along with percocet PRN during last visit.  He told me that he only took one MS contin and has finished all his percocet pills already. He takes 5-6 percocets daily due to the pain. He feels his pain is not controlled. He continues to work with light duty.  He feels SOB, at the baseline. Pain is 10/10. He will have simulation today and will be started on RT soon.   Review of Systems  Constitutional: Negative.   HENT:  Negative.   Eyes: Negative.    Respiratory: Positive for shortness of breath.        Chest pain with deep breathing.   Cardiovascular: Negative.   Gastrointestinal: Negative.   Endocrine: Negative.   Musculoskeletal: Positive for arthralgias and flank pain.  Skin: Negative.   Neurological: Negative.   Hematological: Negative.   Psychiatric/Behavioral: Negative.     MEDICAL HISTORY: Past Medical History:  Diagnosis Date  . Bone metastasis ([place] 07/17/2017  . Bone metastasis ([facility] 07/17/2017  . Metastatic renal cell carcinoma ([facility]     SURGICAL HISTORY: Past Surgical History:  Procedure Laterality Date  . FLEXIBLE BRONCHOSCOPY N/A 07/24/2017   Procedure: FLEXIBLE BRONCHOSCOPY;  Surgeon: SWilhelmina Mcardle MD;  Location: [facility] ORS;  Service: Pulmonary;  Laterality: N/A;  . HERNIA REPAIR    . left middle amputation      with reattachment    SOCIAL HISTORY: Social History   Social History  . Marital status: Single    Spouse name: N/A  . Number of children: N/A  . Years of education: N/A   Occupational History  . Not on file.   Social History Main Topics  . Smoking status: Current Every Day Smoker    Packs/day: 0.25    Years: 38.00  . Smokeless tobacco: Never Used  . Alcohol use No  . Drug use: No  . Sexual activity: Not on file   Other Topics Concern  . Not on file   Social History Narrative  . No narrative on file    FAMILY HISTORY Family  History  Problem Relation Age of Onset  . Cancer Paternal Uncle        brain    ALLERGIES:  has No Known Allergies.  MEDICATIONS:  Current Outpatient Prescriptions  Medication Sig Dispense Refill  . lidocaine-prilocaine (EMLA) cream Apply to affected area once 30 g 3  . oxyCODONE-acetaminophen (PERCOCET) 10-325 MG tablet Take 1 tablet by mouth every 6 (six) hours as needed for pain. 28 tablet 0  . morphine (MS CONTIN) 15 MG 12 hr tablet Take 15 mg by mouth every 12 (twelve) hours.  0   No current facility-administered medications for this visit.     Vitals:   07/30/17 1025  BP: (!) 141/90  Pulse: 85  Resp: 16  Temp: 97.8 F (36.6 C)    Filed Weights   07/30/17 1025  Weight: 240 lb 9.6 oz (109.1 kg)  Physical Exam ECOG 1 GENERAL: No distress, well nourished.  SKIN:  No rashes or significant lesions  HEAD: Normocephalic, No masses, lesions, tenderness or abnormalities  EYES: Conjunctiva are pink, non icteric ENT: External ears normal ,lips , buccal mucosa, and tongue normal and mucous membranes are moist  LYMPH: No palpable cervical and axillary lymphadenopathy  LUNGS: Clear to auscultation, no crackles or wheezes HEART: Regular rate & rhythm, no murmurs, no gallops, S1 normal and S2 normal  ABDOMEN: Abdomen soft, non-tender, normal bowel sounds, I did not appreciate any  masses or organomegaly  MUSCULOSKELETAL: No CVA tenderness and no tenderness on percussion of the back or rib cage. Right scapular blade palpable mass. EXTREMITIES: No edema, no skin discoloration or tenderness NEURO: Alert & oriented, no focal motor/sensory deficits.  LABORATORY DATA: I have personally reviewed the data as listed: CBC    Component Value Date/Time   WBC 7.1 07/22/2017 0815   RBC 4.87 07/22/2017 0815   HGB 15.0 07/22/2017 0815   HCT 42.2 07/22/2017 0815   PLT 237 07/22/2017 0815   MCV 86.7 07/22/2017 0815   MCH 30.8 07/22/2017 0815   MCHC 35.6 07/22/2017 0815   RDW 13.0 07/22/2017 0815   LYMPHSABS 1.9 07/22/2017 0815   MONOABS 0.4 07/22/2017 0815   EOSABS 0.1 07/22/2017 0815   BASOSABS 0.0 07/22/2017 0815   CMP Latest Ref Rng & Units 07/30/2017 07/22/2017 07/15/2017  Glucose 65 - 99 mg/dL 104(H) 140(H) 97  BUN 6 - 20 mg/dL _0 Creatinine 0.61 - 1.24 mg/dL 0.99 0.91 0.93  Sodium 135 - 145 mmol/L 136 137 137  Potassium 3.5 - 5.1 mmol/L 4.9 3.6 4.0  Chloride 101 - 111 mmol/L 99(L) 104 104  CO2 22 - 32 mmol/L _1 Calcium 8.9 - 10.3 mg/dL 9.2 9.0 8.7(L)  Total Protein 6.5 - 8.1 g/dL 7.7 7.2 6.6  Total Bilirubin 0.3 -  1.2 mg/dL 0.4 0.7 0.5  Alkaline Phos 38 - 126 U/L 148(H) 127(H) 124  AST 15 - 41 U/L 17 16 14(L)  ALT 17 - 63 U/L 18 16(L) 15(L)    RADIOGRAPHIC STUDIES: I have personally reviewed the radiological images as listed and agree with the findings in the report CT chest abdomen pelvis 06/26/2017  IMPRESSION: 1. 7 cm right lower pole renal mass. This may reflect urothelial carcinoma given involvement of the collecting system versus renal cell carcinoma. 2. Metastatic disease with thoracic and abdominal lymphadenopathy, lung nodules, and diffuse osseous metastases. 3. Subcentimeter lesion in the right upper lobe bronchus with diffuse abnormal right upper lobe bronchial wall soft tissue thickening.  07/15/2017 No  evidence of pulmonary emboli. Attenuation of the pulmonary arterial branches is noted related to hilar adenopathy. Increase in the size of the central right hilar mass with increase in mediastinal and hilar adenopathy when compared with the recent exam. New right upper lobe collapse secondary to bronchial occlusion.Additionally some patchy infiltrative changes noted in the lower lobe likely related to the acute infiltrate. Diffuse bony metastatic disease with evidence of pathologic fracture involving the left tenth rib lesion.  Pathology 07/10/2017 Surgical Pathology  CASE: [id number]  PATIENT: Severo Balingit  Surgical Pathology Report  DIAGNOSIS:  A. LYTIC BONE LESION, RIGHT ILIAC CREST; CT-GUIDED BIOPSY:  - METASTATIC CARCINOMA, PAX-8 POSITIVE, COMPATIBLE WITH METASTATIC CLEAR  CELL RENAL CELL CARCINOMA.    Surgical Pathology 07/24/2017 CASE: [id number]  SPECIMEN SUBMITTED:  A. Lung, right upper lobe, endobronchial biopsy  DIAGNOSIS:  A. LUNG, RIGHT UPPER LOBE; ENDOBRONCHIAL BIOPSY:  - METASTATIC CARCINOMA CONSISTENT WITH CLEAR CELL RENAL CELL CARCINOMA  07/24/2017 Cytology - Non PAP  CASE: [id number]  SPECIMEN SUBMITTED:  A. Lung, right upper lobe; bronchoscopy  brushing  DIAGNOSIS:  A. LUNG, RIGHT UPPER LOBE; BRONCHOSCOPY BRUSHING:  - POSITIVE FOR MALIGNANCY.  - METASTATIC CARCINOMA CONSISTENT WITH CLEAR CELL RENAL CELL CARCINOMA  ASSESSMENT/PLAN Cancer Staging Renal cell carcinoma of right kidney [facility]) Staging form: Kidney, AJCC 8th Edition - Clinical stage from 07/14/2017: Stage IV (cT3a, cNX, pM1) - Signed by Earlie Server, MD on 07/15/2017 - Pathologic: No stage assigned - Unsigned  1. Metastatic renal cell carcinoma, unspecified laterality ([place])   2. Collapse of right lung   3. Tobacco abuse   4. Metastasis to bone ([place])   5. Goals of care, counseling/discussion   6. Neoplasm related pain     # s/p cycle 1. Tolerate well. # On Xgeva every 28 days for metastatic bone disease.  # I had a lengthy discussion with patient about the importance of taking medication as instructed. Explained the rationale of long acting pain medication. He will start taking his MS contin 34m BID from today and will call me on Friday to let me know if it is helping and he will let me know how many percocet he takes daily. I will further titrate his pain medication if needed.  I refilled his weekly percocet Percocet 10/325 Q 6 hour PRN. He reports no constipation.  # following radiation oncology for palliative RT for symptom control.  # smoke cessation discussed in details.    Orders Placed This Encounter  Procedures  . Comprehensive metabolic panel    Standing Status:   Future    Number of Occurrences:   1    Standing Expiration Date:   07/30/2018   Follow-up on in 1 week.   All questions were answered. The patient knows to call the clinic with any problems, questions or concerns..Earlie Server MD  07/30/2017 2:37 PM

## 2017-07-31 ENCOUNTER — Telehealth: Payer: Self-pay | Admitting: Pulmonary Disease

## 2017-07-31 NOTE — Telephone Encounter (Signed)
Patient family / friend calling  Re: below information.  She was not aware of this conversation patient had with Dr. Alva Garnet and wants to know about picking up an rx for cough or what he should take instead.  Please call to discuss.

## 2017-07-31 NOTE — Telephone Encounter (Signed)
Spoke with Larry Watkins and gave her the response listed below per DS. She states they will also call the oncologist and let them know. Nothing further needed.    Larry Mcardle, MD      07/29/17 9:01 PM  Note    This medicine cannot be called in as it is a narcotic/opioid. I spoke with him and advised using 5 mg oxycodone which he has for pain as needed for severe cough. He sees oncologist 07/30/17  Merton Border, MD PCCM service Mobile [phone] Pager [phone] 07/29/2017 9:02 PM

## 2017-08-01 ENCOUNTER — Encounter: Payer: Self-pay | Admitting: Oncology

## 2017-08-01 DIAGNOSIS — Z7189 Other specified counseling: Secondary | ICD-10-CM | POA: Insufficient documentation

## 2017-08-04 DIAGNOSIS — Z510 Encounter for antineoplastic radiation therapy: Secondary | ICD-10-CM | POA: Diagnosis not present

## 2017-08-06 ENCOUNTER — Ambulatory Visit
Admission: RE | Admit: 2017-08-06 | Discharge: 2017-08-06 | Disposition: A | Discharge status: Home / Self Care | Payer: Medicaid Other | Source: Ambulatory Visit | Attending: Radiation Oncology | Admitting: Radiation Oncology

## 2017-08-06 ENCOUNTER — Encounter: Payer: Self-pay | Admitting: Oncology

## 2017-08-06 ENCOUNTER — Telehealth: Payer: Self-pay

## 2017-08-06 ENCOUNTER — Inpatient Hospital Stay (HOSPITAL_BASED_OUTPATIENT_CLINIC_OR_DEPARTMENT_OTHER): Payer: Medicaid Other | Admitting: Oncology

## 2017-08-06 ENCOUNTER — Other Ambulatory Visit: Payer: Self-pay | Admitting: *Deleted

## 2017-08-06 ENCOUNTER — Inpatient Hospital Stay: Payer: Medicaid Other | Attending: Radiation Oncology

## 2017-08-06 VITALS — BP 155/91 | HR 84 | Temp 98.4°F | Wt 241.3 lb

## 2017-08-06 DIAGNOSIS — Z5112 Encounter for antineoplastic immunotherapy: Secondary | ICD-10-CM | POA: Insufficient documentation

## 2017-08-06 DIAGNOSIS — F17210 Nicotine dependence, cigarettes, uncomplicated: Secondary | ICD-10-CM | POA: Diagnosis not present

## 2017-08-06 DIAGNOSIS — M25511 Pain in right shoulder: Secondary | ICD-10-CM | POA: Insufficient documentation

## 2017-08-06 DIAGNOSIS — C7801 Secondary malignant neoplasm of right lung: Secondary | ICD-10-CM | POA: Diagnosis not present

## 2017-08-06 DIAGNOSIS — R109 Unspecified abdominal pain: Secondary | ICD-10-CM | POA: Insufficient documentation

## 2017-08-06 DIAGNOSIS — Z510 Encounter for antineoplastic radiation therapy: Secondary | ICD-10-CM | POA: Diagnosis not present

## 2017-08-06 DIAGNOSIS — C7951 Secondary malignant neoplasm of bone: Secondary | ICD-10-CM | POA: Insufficient documentation

## 2017-08-06 DIAGNOSIS — C649 Malignant neoplasm of unspecified kidney, except renal pelvis: Secondary | ICD-10-CM

## 2017-08-06 DIAGNOSIS — G893 Neoplasm related pain (acute) (chronic): Secondary | ICD-10-CM

## 2017-08-06 DIAGNOSIS — R05 Cough: Secondary | ICD-10-CM | POA: Insufficient documentation

## 2017-08-06 DIAGNOSIS — R0602 Shortness of breath: Secondary | ICD-10-CM | POA: Diagnosis not present

## 2017-08-06 DIAGNOSIS — M791 Myalgia: Secondary | ICD-10-CM | POA: Insufficient documentation

## 2017-08-06 DIAGNOSIS — C641 Malignant neoplasm of right kidney, except renal pelvis: Secondary | ICD-10-CM | POA: Diagnosis not present

## 2017-08-06 DIAGNOSIS — Z7189 Other specified counseling: Secondary | ICD-10-CM

## 2017-08-06 DIAGNOSIS — Z720 Tobacco use: Secondary | ICD-10-CM

## 2017-08-06 DIAGNOSIS — J9811 Atelectasis: Secondary | ICD-10-CM

## 2017-08-06 LAB — CBC WITH DIFFERENTIAL/PLATELET
BASOS ABS: 0.1 10*3/uL (ref 0–0.1)
BASOS PCT: 1 %
Eosinophils Absolute: 0.2 10*3/uL (ref 0–0.7)
Eosinophils Relative: 2 %
HEMATOCRIT: 39.8 % — AB (ref 40.0–52.0)
Hemoglobin: 14 g/dL (ref 13.0–18.0)
Lymphocytes Relative: 29 %
Lymphs Abs: 2.3 10*3/uL (ref 1.0–3.6)
MCH: 30.6 pg (ref 26.0–34.0)
MCHC: 35.3 g/dL (ref 32.0–36.0)
MCV: 86.8 fL (ref 80.0–100.0)
MONO ABS: 0.8 10*3/uL (ref 0.2–1.0)
Monocytes Relative: 10 %
Neutro Abs: 4.6 10*3/uL (ref 1.4–6.5)
Neutrophils Relative %: 58 %
PLATELETS: 278 10*3/uL (ref 150–440)
RBC: 4.58 MIL/uL (ref 4.40–5.90)
RDW: 13.4 % (ref 11.5–14.5)
WBC: 7.9 10*3/uL (ref 3.8–10.6)

## 2017-08-06 LAB — COMPREHENSIVE METABOLIC PANEL
ALBUMIN: 3.5 g/dL (ref 3.5–5.0)
ALT: 15 U/L — ABNORMAL LOW (ref 17–63)
AST: 14 U/L — AB (ref 15–41)
Alkaline Phosphatase: 156 U/L — ABNORMAL HIGH (ref 38–126)
Anion gap: 9 (ref 5–15)
BILIRUBIN TOTAL: 0.2 mg/dL — AB (ref 0.3–1.2)
BUN: 14 mg/dL (ref 6–20)
CHLORIDE: 102 mmol/L (ref 101–111)
CO2: 26 mmol/L (ref 22–32)
CREATININE: 1.05 mg/dL (ref 0.61–1.24)
Calcium: 9 mg/dL (ref 8.9–10.3)
GFR calc Af Amer: >60 mL/min (ref >60)
GFR calc non Af Amer: >60 mL/min (ref >60)
Glucose, Bld: 92 mg/dL (ref 65–99)
Potassium: 4.5 mmol/L (ref 3.5–5.1)
Sodium: 137 mmol/L (ref 135–145)
TOTAL PROTEIN: 7.1 g/dL (ref 6.5–8.1)

## 2017-08-06 LAB — TSH: TSH: 1.516 u[IU]/mL (ref 0.350–4.500)

## 2017-08-06 MED ORDER — MORPHINE SULFATE ER 30 MG PO TBCR: 30.0000 mg | EXTENDED_RELEASE_TABLET | Freq: Two times a day (BID) | ORAL | 0 refills | Status: DC

## 2017-08-06 MED ORDER — OXYCODONE-ACETAMINOPHEN 10-325 MG PO TABS: 1.0000 | ORAL_TABLET | Freq: Four times a day (QID) | ORAL | 0 refills | Status: DC | PRN

## 2017-08-06 NOTE — Progress Notes (Signed)
[facility] follow up visit  Date of visit: 08/06/17 CHIEF COMPLAINTS/PURPOSE OF CONSULTATION: Evaluation before cycle 1 Nivolumab and Ipilimumab  HISTORY OF PRESENTING ILLNESS: 1[name] 56 y.o. male with 40 pack year smoking histroy is here for evaluation of abnormal image finding. Patient has had right shoulder pain  three months after reaching below a bar to grab a glass. Patient is not able to raise his right arm over shoulder level.  Also having left flank pain and 2 weeks ago he has had pink urine for a few days and resolved after taking a course of amoxicillin. He is otherwise healthy and does not take any medication.  Patient has 40 pack year smoking history.    2 Images:  06/26/2017 CT scan showed a concerning right kidney mass with possible metastatic disease with thoracic and abdominal lymphadenopathy, lung nodules, and diffuse osseous metastases. 07/15/2017 CT PE protocol for evaluation of shortness of breath or chest pain showed no PE, however right lung collapse and rapid progression of lung adenopathy.   3 Pathology:     07/10/2017 07/24/2017 A. Lung, right upper lobe, endobronchial biopsy DIAGNOSIS: A. LUNG, RIGHT UPPER LOBE; ENDOBRONCHIAL BIOPSY: - METASTATIC CARCINOMA CONSISTENT WITH CLEAR CELL RENAL CELL CARCINOMA    Bronchoscopy on 07/24/2017 and biopsy revealed metastatic renal cell cancer.   4 Treatment started on 1st line treatment with nivolumab and ipilimumab on 07/22/2017      INTERVAL HISTORY Patient is accompanied with his significant other to clinic for evaluation for tolerance of immunotherapy and pain control. I started him on MS contin 61m BID along with percocet PRN during last visit. He feels pain is slightly better, rate it as 7 out 10. He takes 5 or 6 percocet daily and has ran out his pain medication. Currently rate his pain at 10/10.  He feels SOB is slightly better, he will be started on RT soon.   Review of Systems   Constitutional: Negative.   HENT:  Negative.   Eyes: Negative.   Respiratory: Positive for shortness of breath.        Chest pain with deep breathing.   Cardiovascular: Negative.   Gastrointestinal: Negative.   Endocrine: Negative.   Musculoskeletal: Positive for arthralgias and flank pain.  Skin: Negative.   Neurological: Negative.   Hematological: Negative.   Psychiatric/Behavioral: Negative.     MEDICAL HISTORY: Past Medical History:  Diagnosis Date  . [place]  . [place]  . [place]ast[place]     SURGICAL HISTORY: Past Surgical History:  Procedure Laterality Date  . FLEXIBLE BRONCHOSCOPY N/A 07/24/2017   Procedure: FLEXIBLE BRONCHOSCOPY;  Surgeon: SWilhelmina Mcardle MD;  Location: [facility] ORS;  Service: Pulmonary;  Laterality: N/A;  . HERNIA REPAIR    . left middle amputation      with reattachment    SOCIAL HISTORY: Social History   Social History  . Marital status: Single    Spouse name: N/A  . Number of children: N/A  . Years of education: N/A   Occupational History  . Not on file.   Social History Main Topics  . Smoking status: Current Every Day Smoker    Packs/day: 0.25    Years: 38.00  . Smokeless tobacco: Never Used  . Alcohol use No  . Drug use: No  . Sexual activity: Not on file   Other Topics Concern  . Not on file   Social History Narrative  . No  narrative on file    FAMILY HISTORY Family History  Problem Relation Age of Onset  . Cancer Paternal Uncle        brain    ALLERGIES:  has No Known Allergies.  MEDICATIONS:  Current Outpatient Prescriptions  Medication Sig Dispense Refill  . oxyCODONE-acetaminophen (PERCOCET) 10-325 MG tablet Take 1 tablet by mouth every 6 (six) hours as needed for pain. 40 tablet 0  . morphine (MS CONTIN) 30 MG 12 hr tablet Take 1 tablet (30 mg total) by mouth every 12 (twelve) hours. 14 tablet 0   No current facility-administered  medications for this visit.    Vitals:   08/06/17 1502  BP: (!) 155/91  Pulse: 84  Temp: 98.4 F (36.9 C)    Filed Weights   08/06/17 1502  Weight: 241 lb 5 oz (109.5 kg)  Physical Exam ECOG 1 GENERAL: No distress, well nourished.  SKIN:  No rashes or significant lesions  HEAD: Normocephalic, No masses, lesions, tenderness or abnormalities  EYES: Conjunctiva are pink, non icteric ENT: External ears normal ,lips , buccal mucosa, and tongue normal and mucous membranes are moist  LYMPH: No palpable cervical and axillary lymphadenopathy  LUNGS: Clear to auscultation, no crackles or wheezes HEART: Regular rate & rhythm, no murmurs, no gallops, S1 normal and S2 normal  ABDOMEN: Abdomen soft, non-tender, normal bowel sounds, I did not appreciate any  masses or organomegaly  MUSCULOSKELETAL: No CVA tenderness and no tenderness on percussion of the back or rib cage. Right scapular blade palpable mass. EXTREMITIES: No edema, no skin discoloration or tenderness NEURO: Alert & oriented, no focal motor/sensory deficits.  LABORATORY DATA: I have personally reviewed the data as listed: CBC    Component Value Date/Time   WBC 7.9 08/06/2017 1439   RBC 4.58 08/06/2017 1439   HGB 14.0 08/06/2017 1439   HCT 39.8 (L) 08/06/2017 1439   PLT 278 08/06/2017 1439   MCV 86.8 08/06/2017 1439   MCH 30.6 08/06/2017 1439   MCHC 35.3 08/06/2017 1439   RDW 13.4 08/06/2017 1439   LYMPHSABS 2.3 08/06/2017 1439   MONOABS 0.8 08/06/2017 1439   EOSABS 0.2 08/06/2017 1439   BASOSABS 0.1 08/06/2017 1439   CMP Latest Ref Rng & Units 08/06/2017 07/30/2017 07/22/2017  Glucose 65 - 99 mg/dL 92 104(H) 140(H)  BUN 6 - 20 mg/dL _0 Creatinine 0.61 - 1.24 mg/dL 1.05 0.99 0.91  Sodium 135 - 145 mmol/L 137 136 137  Potassium 3.5 - 5.1 mmol/L 4.5 4.9 3.6  Chloride 101 - 111 mmol/L 102 99(L) 104  CO2 22 - 32 mmol/L _1 Calcium 8.9 - 10.3 mg/dL 9.0 9.2 9.0  Total Protein 6.5 - 8.1 g/dL 7.1 7.7 7.2  Total  Bilirubin 0.3 - 1.2 mg/dL 0.2(L) 0.4 0.7  Alkaline Phos 38 - 126 U/L 156(H) 148(H) 127(H)  AST 15 - 41 U/L 14(L) 17 16  ALT 17 - 63 U/L 15(L) 18 16(L)    RADIOGRAPHIC STUDIES: I have personally reviewed the radiological images as listed and agree with the findings in the report CT chest abdomen pelvis 06/26/2017  IMPRESSION: 1. 7 cm right lower pole renal mass. This may reflect urothelial carcinoma given involvement of the collecting system versus renal cell carcinoma. 2. Metastatic disease with thoracic and abdominal lymphadenopathy, lung nodules, and diffuse osseous metastases. 3. Subcentimeter lesion in the right upper lobe bronchus with diffuse abnormal right upper lobe bronchial wall soft tissue thickening.  07/15/2017 No evidence of  pulmonary emboli. Attenuation of the pulmonary arterial branches is noted related to hilar adenopathy. Increase in the size of the central right hilar mass with increase in mediastinal and hilar adenopathy when compared with the recent exam. New right upper lobe collapse secondary to bronchial occlusion.Additionally some patchy infiltrative changes noted in the lower lobe likely related to the acute infiltrate. Diffuse bony metastatic disease with evidence of pathologic fracture involving the left tenth rib lesion.  Pathology 07/10/2017 Surgical Pathology  CASE: [id number]  PATIENT: Gustaf Stambaugh  Surgical Pathology Report  DIAGNOSIS:  A. LYTIC BONE LESION, RIGHT ILIAC CREST; CT-GUIDED BIOPSY:  - METASTATIC CARCINOMA, PAX-8 POSITIVE, COMPATIBLE WITH METASTATIC CLEAR  CELL RENAL CELL CARCINOMA.    Surgical Pathology 07/24/2017 CASE: [id number]  SPECIMEN SUBMITTED:  A. Lung, right upper lobe, endobronchial biopsy  DIAGNOSIS:  A. LUNG, RIGHT UPPER LOBE; ENDOBRONCHIAL BIOPSY:  - METASTATIC CARCINOMA CONSISTENT WITH CLEAR CELL RENAL CELL CARCINOMA  07/24/2017 Cytology - Non PAP  CASE: [id number]  SPECIMEN SUBMITTED:  A. Lung, right upper  lobe; bronchoscopy brushing  DIAGNOSIS:  A. LUNG, RIGHT UPPER LOBE; BRONCHOSCOPY BRUSHING:  - POSITIVE FOR MALIGNANCY.  - METASTATIC CARCINOMA CONSISTENT WITH CLEAR CELL RENAL CELL CARCINOMA  ASSESSMENT/PLAN Cancer Staging Renal cell carcinoma of right kidney [facility]) Staging form: Kidney, AJCC 8th Edition - Clinical stage from 07/14/2017: Stage IV (cT3a, cNX, pM1) - Signed by Earlie Server, MD on 07/15/2017 - Pathologic: No stage assigned - Unsigned  1. Metastatic renal cell carcinoma, unspecified laterality ([place])   2. Collapse of right lung   3. Tobacco abuse   4. Metastasis to bone ([place])   5. Goals of care, counseling/discussion   6. Neoplasm related pain   7. SOB (shortness of breath)     # s/p cycle 1. Tolerate well. # Xgeva every 28 days for metastatic bone disease, discussed with patient about side effects including jaw necrosis and he is willing to proceed.  # Increase MS contin to 61m BID dispense 14 tabs and refilled percocet Percocet 10/325 Q 6 hour PRN dispense 40 tabs. . He reports no constipation.  # following radiation oncology for palliative RT for symptom control.  # smoke cessation discussed in details. He is only smoking 5 cigarettes daily. Encourage him to continue weaning down smoking.    No orders of the defined types were placed in this encounter.  Follow-up on in 1 week, on 08/13/2017, on day 1 of cycle 2 immunotherapy.   All questions were answered. The patient knows to call the clinic with any problems, questions or concerns..Earlie Server MD  08/06/2017 3:18 PM

## 2017-08-06 NOTE — Telephone Encounter (Signed)
  Oncology Nurse [facility] called and notified that Larry Watkins is bringing two scripts that we will cover on [place]. Navigator Location: [facility] (08/06/17 1500)   )Navigator Encounter Type: Telephone (08/06/17 1500)                                                    Time Spent with Patient: 15 (08/06/17 1500)

## 2017-08-06 NOTE — Progress Notes (Signed)
Patient here today for follow up.  Patient c/o 10/10 pain in his right hip

## 2017-08-07 ENCOUNTER — Ambulatory Visit
Admission: RE | Admit: 2017-08-07 | Discharge: 2017-08-07 | Disposition: A | Discharge status: Home / Self Care | Payer: Medicaid Other | Source: Ambulatory Visit | Attending: Radiation Oncology | Admitting: Radiation Oncology

## 2017-08-07 ENCOUNTER — Inpatient Hospital Stay (HOSPITAL_BASED_OUTPATIENT_CLINIC_OR_DEPARTMENT_OTHER): Payer: Medicaid Other | Admitting: Oncology

## 2017-08-07 ENCOUNTER — Telehealth: Payer: Self-pay | Admitting: Oncology

## 2017-08-07 ENCOUNTER — Inpatient Hospital Stay: Payer: Medicaid Other

## 2017-08-07 ENCOUNTER — Encounter: Payer: Self-pay | Admitting: Oncology

## 2017-08-07 ENCOUNTER — Other Ambulatory Visit: Payer: Self-pay | Admitting: *Deleted

## 2017-08-07 VITALS — BP 128/85 | HR 91 | Temp 99.2°F | Resp 18

## 2017-08-07 DIAGNOSIS — C7801 Secondary malignant neoplasm of right lung: Secondary | ICD-10-CM

## 2017-08-07 DIAGNOSIS — C7951 Secondary malignant neoplasm of bone: Secondary | ICD-10-CM | POA: Diagnosis not present

## 2017-08-07 DIAGNOSIS — C649 Malignant neoplasm of unspecified kidney, except renal pelvis: Secondary | ICD-10-CM

## 2017-08-07 DIAGNOSIS — G893 Neoplasm related pain (acute) (chronic): Secondary | ICD-10-CM

## 2017-08-07 DIAGNOSIS — C641 Malignant neoplasm of right kidney, except renal pelvis: Secondary | ICD-10-CM

## 2017-08-07 DIAGNOSIS — F17210 Nicotine dependence, cigarettes, uncomplicated: Secondary | ICD-10-CM

## 2017-08-07 DIAGNOSIS — R109 Unspecified abdominal pain: Secondary | ICD-10-CM

## 2017-08-07 DIAGNOSIS — M791 Myalgia: Secondary | ICD-10-CM

## 2017-08-07 DIAGNOSIS — R042 Hemoptysis: Secondary | ICD-10-CM

## 2017-08-07 DIAGNOSIS — R05 Cough: Secondary | ICD-10-CM | POA: Diagnosis not present

## 2017-08-07 DIAGNOSIS — M25511 Pain in right shoulder: Secondary | ICD-10-CM

## 2017-08-07 DIAGNOSIS — R0602 Shortness of breath: Secondary | ICD-10-CM

## 2017-08-07 DIAGNOSIS — Z510 Encounter for antineoplastic radiation therapy: Secondary | ICD-10-CM | POA: Diagnosis not present

## 2017-08-07 DIAGNOSIS — Z5112 Encounter for antineoplastic immunotherapy: Secondary | ICD-10-CM | POA: Diagnosis not present

## 2017-08-07 LAB — CBC WITH DIFFERENTIAL/PLATELET
BASOS ABS: 0 10*3/uL (ref 0–0.1)
BASOS PCT: 0 %
Eosinophils Absolute: 0.1 10*3/uL (ref 0–0.7)
Eosinophils Relative: 2 %
HEMATOCRIT: 41.1 % (ref 40.0–52.0)
Hemoglobin: 14.5 g/dL (ref 13.0–18.0)
Lymphocytes Relative: 30 %
Lymphs Abs: 2 10*3/uL (ref 1.0–3.6)
MCH: 30.5 pg (ref 26.0–34.0)
MCHC: 35.2 g/dL (ref 32.0–36.0)
MCV: 86.7 fL (ref 80.0–100.0)
Monocytes Absolute: 0.7 10*3/uL (ref 0.2–1.0)
Monocytes Relative: 10 %
NEUTROS PCT: 58 %
Neutro Abs: 4 10*3/uL (ref 1.4–6.5)
Platelets: 282 10*3/uL (ref 150–440)
RBC: 4.74 MIL/uL (ref 4.40–5.90)
RDW: 13.1 % (ref 11.5–14.5)
WBC: 6.9 10*3/uL (ref 3.8–10.6)

## 2017-08-07 NOTE — Progress Notes (Signed)
Patient coughed up some blood 4-5 episodes yesterday.  He is having severe right hip pain 10/10 on pain scale and discussed with Dr. Baruch Gouty today who is going to order a bone scan.

## 2017-08-07 NOTE — Progress Notes (Signed)
[facility] follow up visit  Date of visit: 08/07/17 CHIEF COMPLAINTS/PURPOSE OF CONSULTATION: Evaluation before cycle 1 Nivolumab and Ipilimumab  HISTORY OF PRESENTING ILLNESS: 1[name] 56 y.o. male with 40 pack year smoking histroy is here for evaluation of abnormal image finding. Patient has had right shoulder pain  three months after reaching below a bar to grab a glass. Patient is not able to raise his right arm over shoulder level.  Also having left flank pain and 2 weeks ago he has had pink urine for a few days and resolved after taking a course of amoxicillin. He is otherwise healthy and does not take any medication.  Patient has 40 pack year smoking history.    2 Images:  06/26/2017 CT scan showed a concerning right kidney mass with possible metastatic disease with thoracic and abdominal lymphadenopathy, lung nodules, and diffuse osseous metastases. 07/15/2017 CT PE protocol for evaluation of shortness of breath or chest pain showed no PE, however right lung collapse and rapid progression of lung adenopathy.   3 Pathology:     07/10/2017 07/24/2017 A. Lung, right upper lobe, endobronchial biopsy DIAGNOSIS: A. LUNG, RIGHT UPPER LOBE; ENDOBRONCHIAL BIOPSY: - METASTATIC CARCINOMA CONSISTENT WITH CLEAR CELL RENAL CELL CARCINOMA    Bronchoscopy on 07/24/2017 and biopsy revealed metastatic renal cell cancer.   4 Treatment started on 1st line treatment with nivolumab and ipilimumab on 07/22/2017.    Also started on palliative RT to lung and involved bone mets today.       INTERVAL HISTORY Patient is accompanied with his significant other to clinic for evaluation of hemoptysis which started after he left clinic. This was before his radiation treatment.  He reports coughing up quarter size bright red blood mixed with sputum for 4-5 times. He got his pain rx yesterday and feels that his pain is better controlled with new regimen.    Review of Systems  Constitutional:  Negative.   HENT:  Negative.   Eyes: Negative.   Respiratory: Positive for shortness of breath.        Chest pain with deep breathing.   Cardiovascular: Negative.   Gastrointestinal: Negative.   Endocrine: Negative.   Musculoskeletal: Positive for arthralgias and flank pain.       Right hip pain.  Skin: Negative.   Neurological: Negative.   Hematological: Negative.   Psychiatric/Behavioral: Negative.     MEDICAL HISTORY: Past Medical History:  Diagnosis Date  . Bone metastasis ([place]) 07/17/2017  . Bone metastasis ([place]) 07/17/2017  . Metastatic renal cell carcinoma ([place])     SURGICAL HISTORY: Past Surgical History:  Procedure Laterality Date  . FLEXIBLE BRONCHOSCOPY N/A 07/24/2017   Procedure: FLEXIBLE BRONCHOSCOPY;  Surgeon: [name], MD;  Location: [facility] ORS;  Service: Pulmonary;  Laterality: N/A;  . HERNIA REPAIR    . left middle amputation      with reattachment    SOCIAL HISTORY: Social History   Social History  . Marital status: Single    Spouse name: N/A  . Number of children: N/A  . Years of education: N/A   Occupational History  . Not on file.   Social History Main Topics  . Smoking status: Current Every Day Smoker    Packs/day: 0.25    Years: 38.00  . Smokeless tobacco: Never Used  . Alcohol use No  . Drug use: No  . Sexual activity: Not on file   Other Topics Concern  . Not on file   Social  History Narrative  . No narrative on file    FAMILY HISTORY Family History  Problem Relation Age of Onset  . Cancer Paternal Uncle        brain    ALLERGIES:  has No Known Allergies.  MEDICATIONS:  Current Outpatient Prescriptions  Medication Sig Dispense Refill  . morphine (MS CONTIN) 30 MG 12 hr tablet Take 1 tablet (30 mg total) by mouth every 12 (twelve) hours. 14 tablet 0  . oxyCODONE-acetaminophen (PERCOCET) 10-325 MG tablet Take 1 tablet by mouth every 6 (six) hours as needed for pain. 40 tablet 0   No current facility-administered  medications for this visit.    Vitals:   08/07/17 1435  BP: 128/85  Pulse: 91  Resp: 18  Temp: 99.2 F (37.3 C)    There were no vitals filed for this visit.Physical Exam ECOG 1 GENERAL: No distress, well nourished.  SKIN:  No rashes or significant lesions  HEAD: Normocephalic, No masses, lesions, tenderness or abnormalities  EYES: Conjunctiva are pink, non icteric ENT: External ears normal ,lips , buccal mucosa, and tongue normal and mucous membranes are moist  LYMPH: No palpable cervical and axillary lymphadenopathy  LUNGS: Clear to auscultation, no crackles or wheezes HEART: Regular rate & rhythm, no murmurs, no gallops, S1 normal and S2 normal  ABDOMEN: Abdomen soft, non-tender, normal bowel sounds, I did not appreciate any  masses or organomegaly  MUSCULOSKELETAL: No CVA tenderness and no tenderness on percussion of the back or rib cage. Right scapular blade palpable mass. EXTREMITIES: No edema, no skin discoloration or tenderness NEURO: Alert & oriented, no focal motor/sensory deficits.  LABORATORY DATA: I have personally reviewed the data as listed: CBC    Component Value Date/Time   WBC 6.9 08/07/2017 1001   RBC 4.74 08/07/2017 1001   HGB 14.5 08/07/2017 1001   HCT 41.1 08/07/2017 1001   PLT 282 08/07/2017 1001   MCV 86.7 08/07/2017 1001   MCH 30.5 08/07/2017 1001   MCHC 35.2 08/07/2017 1001   RDW 13.1 08/07/2017 1001   LYMPHSABS 2.0 08/07/2017 1001   MONOABS 0.7 08/07/2017 1001   EOSABS 0.1 08/07/2017 1001   BASOSABS 0.0 08/07/2017 1001   CMP Latest Ref Rng & Units 08/06/2017 07/30/2017 07/22/2017  Glucose 65 - 99 mg/dL 92 104(H) 140(H)  BUN 6 - 20 mg/dL _0 Creatinine 0.61 - 1.24 mg/dL 1.05 0.99 0.91  Sodium 135 - 145 mmol/L 137 136 137  Potassium 3.5 - 5.1 mmol/L 4.5 4.9 3.6  Chloride 101 - 111 mmol/L 102 99(L) 104  CO2 22 - 32 mmol/L _1 Calcium 8.9 - 10.3 mg/dL 9.0 9.2 9.0  Total Protein 6.5 - 8.1 g/dL 7.1 7.7 7.2  Total Bilirubin 0.3 - 1.2  mg/dL 0.2(L) 0.4 0.7  Alkaline Phos 38 - 126 U/L 156(H) 148(H) 127(H)  AST 15 - 41 U/L 14(L) 17 16  ALT 17 - 63 U/L 15(L) 18 16(L)    RADIOGRAPHIC STUDIES: I have personally reviewed the radiological images as listed and agree with the findings in the report CT chest abdomen pelvis 06/26/2017  IMPRESSION: 1. 7 cm right lower pole renal mass. This may reflect urothelial carcinoma given involvement of the collecting system versus renal cell carcinoma. 2. Metastatic disease with thoracic and abdominal lymphadenopathy, lung nodules, and diffuse osseous metastases. 3. Subcentimeter lesion in the right upper lobe bronchus with diffuse abnormal right upper lobe bronchial wall soft tissue thickening.  07/15/2017 No evidence of pulmonary emboli.  Attenuation of the pulmonary arterial branches is noted related to hilar adenopathy. Increase in the size of the central right hilar mass with increase in mediastinal and hilar adenopathy when compared with the recent exam. New right upper lobe collapse secondary to bronchial occlusion.Additionally some patchy infiltrative changes noted in the lower lobe likely related to the acute infiltrate. Diffuse bony metastatic disease with evidence of pathologic fracture involving the left tenth rib lesion.  Pathology 07/10/2017 Surgical Pathology  CASE: [id number]  PATIENT: Tyqwan Depuy  Surgical Pathology Report  DIAGNOSIS:  A. LYTIC BONE LESION, RIGHT ILIAC CREST; CT-GUIDED BIOPSY:  - METASTATIC CARCINOMA, PAX-8 POSITIVE, COMPATIBLE WITH METASTATIC CLEAR  CELL RENAL CELL CARCINOMA.    Surgical Pathology 07/24/2017 CASE: [id number]  SPECIMEN SUBMITTED:  A. Lung, right upper lobe, endobronchial biopsy  DIAGNOSIS:  A. LUNG, RIGHT UPPER LOBE; ENDOBRONCHIAL BIOPSY:  - METASTATIC CARCINOMA CONSISTENT WITH CLEAR CELL RENAL CELL CARCINOMA  07/24/2017 Cytology - Non PAP  CASE: [id number]  SPECIMEN SUBMITTED:  A. Lung, right upper lobe; bronchoscopy  brushing  DIAGNOSIS:  A. LUNG, RIGHT UPPER LOBE; BRONCHOSCOPY BRUSHING:  - POSITIVE FOR MALIGNANCY.  - METASTATIC CARCINOMA CONSISTENT WITH CLEAR CELL RENAL CELL CARCINOMA  ASSESSMENT/PLAN Cancer Staging Renal cell carcinoma of right kidney [facility]) Staging form: Kidney, AJCC 8th Edition - Clinical stage from 07/14/2017: Stage IV (cT3a, cNX, pM1) - Signed by Earlie Server, MD on 07/15/2017 - Pathologic: No stage assigned - Unsigned  1. Hemoptysis   2. Metastatic renal cell carcinoma, unspecified laterality ([place])   3. Metastasis to bone [facility])    # Discussed with pulm Dr.Simonds. His lung metastasis is very vascular and hemoptysis is likely due to rupture of small vessel. I explained to patient that if he cough up a large amount of bright red blood or if he goes into respiratory stress, he should call EMS right away and go to ER for evaluation. Currently he only coughs small amount of blood and has stable hemoglobin, no respiratory stress, will close monitor him.   # s/p cycle 1. Tolerate well. # Xgeva every 28 days for metastatic bone disease, discussed with patient about side effects including jaw necrosis and he is willing to proceed. Patient currently denies any ongoing dental work or dental complaints. Advise patient to obtain dental evaluation. He voices understanding.   #Conitnue current regimen. MS contin to 63m BID dispense 14 tabs and refilled percocet Percocet 10/325 Q 6 hour PRN dispense 40 tabs. . He reports no constipation.  # following radiation oncology for palliative RT for symptom control.  # smoke cessation discussed in details. He is only smoking 5 cigarettes daily. Encourage him to continue weaning down smoking.    No orders of the defined types were placed in this encounter.  Follow-up on in 1 week, on 08/13/2017, on day 1 of cycle 2 immunotherapy.   All questions were answered. The patient knows to call the clinic with any problems, questions or concerns..Earlie Server MD   08/07/2017 2:55 PM

## 2017-08-07 NOTE — Telephone Encounter (Signed)
Patient came by scheduling to say that he has been coughing up Blood.  S/W Dr Tasia Catchings, patient was added for follow up with Dr Tasia Catchings @ [facility] with labs for today. Patient is aware of appointments.

## 2017-08-08 ENCOUNTER — Ambulatory Visit: Payer: Self-pay | Admitting: Oncology

## 2017-08-08 ENCOUNTER — Ambulatory Visit
Admission: RE | Admit: 2017-08-08 | Discharge: 2017-08-08 | Disposition: A | Discharge status: Home / Self Care | Payer: Medicaid Other | Source: Ambulatory Visit | Attending: Radiation Oncology | Admitting: Radiation Oncology

## 2017-08-08 DIAGNOSIS — Z510 Encounter for antineoplastic radiation therapy: Secondary | ICD-10-CM | POA: Diagnosis not present

## 2017-08-08 NOTE — Progress Notes (Deleted)
Symptom Management Consult note [facility]  Telephone:([phone] [phone]  Patient Care Team: Earlie Server, MD as PCP - General (Oncology)   Name of the patient: Larry Watkins  [id number]  03/05/1961   Date of visit: 08/08/17  Diagnosis- Renal cell carcinoma of right kidney   Chief complaint/ Reason for visit- Allergic reaction to Morphine  Heme/Onc history: Larry Watkins 56 y.o. male with 40 pack year smoking histroy is here for evaluation of abnormal image finding. Patient has had right shoulder pain  three months after reaching below a bar to grab a glass. Patient is not able to raise his right arm over shoulder level.  Also having left flank pain and 2 weeks ago he has had pink urine for a few days and resolved after taking a course of amoxicillin. He is otherwise healthy and does not take any medication.  Patient has 40 pack year smoking history.    Images:  06/26/2017 CT scan showed a concerning right kidney mass with possible metastatic disease with thoracic and abdominal lymphadenopathy, lung nodules, and diffuse osseous metastases. 07/15/2017 CT PE protocol for evaluation of shortness of breath or chest pain showed no PE, however right lung collapse and rapid progression of lung adenopathy.   Pathology:     07/10/2017 07/24/2017 A. Lung, right upper lobe, endobronchial biopsy DIAGNOSIS: A. LUNG, RIGHT UPPER LOBE; ENDOBRONCHIAL BIOPSY: - METASTATIC CARCINOMA CONSISTENT WITH CLEAR CELL RENAL CELL CARCINOMA    Bronchoscopy on 07/24/2017 and biopsy revealed metastatic renal cell cancer.   Treatment started on 1st line treatment with nivolumab and ipilimumab on 07/22/2017.     Also started on palliative RT to lung and involved bone mets.   Discussed with pulm Dr.Simonds. His lung metastasis is very vascular and hemoptysis is likely due to rupture of small vessel. I explained to patient that if he cough up a large amount of bright red blood or if  he goes into respiratory stress, he should call EMS right away and go to ER for evaluation. Currently he only coughs small amount of blood and has stable hemoglobin, no respiratory stress, will close monitor him.   Xgeva every 28 days for metastatic bone disease, discussed with patient about side effects including jaw necrosis and he is willing to proceed. Patient currently denies any ongoing dental work or dental complaints. Advise patient to obtain dental evaluation. He voices understanding.     Interval history-  PATIENTSEX@ denies any neurologic complaints. She has no recent fevers or illnesses. She denies any easy bleeding or bruising. She has a good appetite and denies weight loss. She has no chest pain. She denies any nausea, vomiting, constipation, or diarrhea. She has no urinary complaints. Patient offers no further specific complaints today.  ECOG FS:{CHL ONC TF:[id number]}  Review of systems- ROS   Current treatment- cycle 1 Nivolumab and Ipilimumab  No Known Allergies   Past Medical History:  Diagnosis Date  . Bone metastasis ([place]) 07/17/2017  . Bone metastasis ([place]) 07/17/2017  . Metastatic renal cell carcinoma [facility])      Past Surgical History:  Procedure Laterality Date  . FLEXIBLE BRONCHOSCOPY N/A 07/24/2017   Procedure: FLEXIBLE BRONCHOSCOPY;  Surgeon: [name], MD;  Location: [facility] ORS;  Service: Pulmonary;  Laterality: N/A;  . HERNIA REPAIR    . left middle amputation      with reattachment    Social History   Social History  . Marital status: Single    Spouse  name: N/A  . Number of children: N/A  . Years of education: N/A   Occupational History  . Not on file.   Social History Main Topics  . Smoking status: Current Every Day Smoker    Packs/day: 0.25    Years: 38.00  . Smokeless tobacco: Never Used  . Alcohol use No  . Drug use: No  . Sexual activity: Not on file   Other Topics Concern  . Not on file   Social History Narrative  . No  narrative on file    Family History  Problem Relation Age of Onset  . Cancer Paternal Uncle        brain     Current Outpatient Prescriptions:  .  morphine (MS CONTIN) 30 MG 12 hr tablet, Take 1 tablet (30 mg total) by mouth every 12 (twelve) hours., Disp: 14 tablet, Rfl: 0 .  oxyCODONE-acetaminophen (PERCOCET) 10-325 MG tablet, Take 1 tablet by mouth every 6 (six) hours as needed for pain., Disp: 40 tablet, Rfl: 0  Physical exam: There were no vitals filed for this visit. Physical Exam   CMP Latest Ref Rng & Units 08/06/2017  Glucose 65 - 99 mg/dL 92  BUN 6 - 20 mg/dL 14  Creatinine 0.61 - 1.24 mg/dL 1.05  Sodium 135 - 145 mmol/L 137  Potassium 3.5 - 5.1 mmol/L 4.5  Chloride 101 - 111 mmol/L 102  CO2 22 - 32 mmol/L 26  Calcium 8.9 - 10.3 mg/dL 9.0  Total Protein 6.5 - 8.1 g/dL 7.1  Total Bilirubin 0.3 - 1.2 mg/dL 0.2(L)  Alkaline Phos 38 - 126 U/L 156(H)  AST 15 - 41 U/L 14(L)  ALT 17 - 63 U/L 15(L)   CBC Latest Ref Rng & Units 08/07/2017  WBC 3.8 - 10.6 K/uL 6.9  Hemoglobin 13.0 - 18.0 g/dL 14.5  Hematocrit 40.0 - 52.0 % 41.1  Platelets 150 - 440 K/uL 282    No images are attached to the encounter.  Ct Angio Chest Pe W Or Wo Contrast  Result Date: 07/15/2017 CLINICAL DATA:  Chest pain and shortness Breath EXAM: CT ANGIOGRAPHY CHEST WITH CONTRAST TECHNIQUE: Multidetector CT imaging of the chest was performed using the standard protocol during bolus administration of intravenous contrast. Multiplanar CT image reconstructions and MIPs were obtained to evaluate the vascular anatomy. CONTRAST:  75 mL Isovue 370. COMPARISON:  06/26/2017 FINDINGS: Cardiovascular: Thoracic aorta is within normal limits without aneurysmal dilatation or dissection. The pulmonary artery is well visualized without filling defect to suggest pulmonary embolism. There is attenuation of the pulmonary arterial branches bilaterally related to hilar adenopathy. Mediastinum/Nodes: The thoracic inlet is within  normal limits. Mediastinal and hilar adenopathy is identified which has increased in the interval from the prior exam. Index right peritracheal lymph node on image number 77 of series 5 measures 2.5 cm increased from 1.8 cm. The central right hilar mass has increased in size now measuring 4.8 x 4.5 cm. Subcarinal lymph node canal measures 2 cm increased from 1.6 cm. Lungs/Pleura: The right upper lobe bronchus is occluded with collapse of almost the entire right upper lobe. Stable nodule is noted in the right lower lobe best seen on image number 47 of series 6. Some ground-glass densities are identified in the right lower lobe likely representing some acute or early infiltrate. Stable subpleural nodule is noted posteriorly best seen on image number 26 of series 6. Upper Abdomen: Visualized upper abdomen is within normal limits. Musculoskeletal: Multiple areas of bony metastatic disease are  noted. These include the left tenth rib proximally with changes of pathologic fracture. Additionally a enlarging T9 lesion is seen as well as an expansile lesion of the right scapula, C7, left scapula, bilateral clavicular heads and bilateral glenoid. Review of the MIP images confirms the above findings. IMPRESSION: No evidence of pulmonary emboli. Attenuation of the pulmonary arterial branches is noted related to hilar adenopathy. Increase in the size of the central right hilar mass with increase in mediastinal and hilar adenopathy when compared with the recent exam. New right upper lobe collapse secondary to bronchial occlusion. Additionally some patchy infiltrative changes noted in the lower lobe likely related to the acute infiltrate. Diffuse bony metastatic disease with evidence of pathologic fracture involving the left tenth rib lesion. Electronically Signed   By: Inez Catalina M.D.   On: 07/15/2017 11:41   Ct Biopsy  Result Date: 07/10/2017 INDICATION: 56 year old male with a heterogeneous mass arising from the lower pole  the right kidney and multifocal of lytic osseous lesions concerning for a primary renal or urothelial malignancy with osseous metastatic disease. He presents today for biopsy of the lytic lesion in the right iliac crest. The right scapular lesion was considered, however there is still significant high density within the scapular lesion consistent with residual bone fragments. This can significantly inhibit pathologic analysis. EXAM: CT BIOPSY MEDICATIONS: None. ANESTHESIA/SEDATION: 100 mcg fentanyl FLUOROSCOPY TIME:  None COMPLICATIONS: None immediate. PROCEDURE: Informed written consent was obtained from the patient after a thorough discussion of the procedural risks, benefits and alternatives. All questions were addressed. A timeout was performed prior to the initiation of the procedure. A planning axial CT scan was performed. The lytic lesion in the right iliac crest was easily identified. A suitable skin entry site was selected and marked. The region was sterilely prepped and draped in standard fashion using chlorhexidine skin prep. Local anesthesia was attained by infiltration with 1% lidocaine. A small dermatotomy was made. Under intermittent CT guidance, a 17 gauge introducer needle was advanced into the margin of the lytic lesion. Multiple 18 gauge core biopsies were then obtained. Of note, the biopsy specimens were scant and highly fragmented. The specimens were placed in formalin and delivered to pathology for further analysis. The patient tolerated the procedure well. IMPRESSION: Technically successful CT-guided core biopsy of lytic lesion in the right iliac crest. Of note, biopsy specimens were fragile, fragmented and relatively scant. Numerous biopsy passes were made throughout the lytic lesion. Electronically Signed   By: Jacqulynn Cadet M.D.   On: 07/10/2017 17:10   Dg Shoulder Left  Result Date: 07/18/2017 CLINICAL DATA:  Patient reports he rolled over while laying on the couch today and felt a  "pop" in his left shoulder. Reports severe pain to anterior and superior surface of left shoulder. Reports previous injuries to left shoulder but no previous surgeries. EXAM: LEFT SHOULDER - 2+ VIEW COMPARISON:  None. FINDINGS: There is no fracture or dislocation. There is mild osteoarthritis of the left glenohumeral joint. There are mild degenerative changes of the acromioclavicular joint. IMPRESSION: 1. Mild osteoarthritis of the left glenohumeral joint. 2.  No acute osseous injury of the left shoulder. Electronically Signed   By: Kathreen Devoid   On: 07/18/2017 15:56     Assessment and plan- Patient is a 56 y.o. male ***  following radiation oncology for palliative RT for symptom control.   smoke cessation discussed in details. He is only smoking 5 cigarettes daily. Encourage him to continue weaning down smoking.  Conitnue current regimen. MS contin to 51m BID dispense 14 tabs and refilled percocet Percocet 10/325 Q 6 hour PRN dispense 40 tabs. . He reports no constipation.    Visit Diagnosis No diagnosis found.  Patient expressed understanding and was in agreement with this plan. He also understands that He can call clinic at any time with any questions, concerns, or complaints.    JMarisue HumbleCChevy Chase Ambulatory Center L Pat AIreland Army Community HospitalPager- [phone]/06/2017 9:31 AM

## 2017-08-10 ENCOUNTER — Encounter: Payer: Self-pay | Admitting: Emergency Medicine

## 2017-08-10 ENCOUNTER — Emergency Department: Payer: Medicaid Other

## 2017-08-10 ENCOUNTER — Emergency Department
Admission: EM | Admit: 2017-08-10 | Discharge: 2017-08-10 | Disposition: A | Discharge status: Home / Self Care | Payer: Medicaid Other | Attending: Emergency Medicine | Admitting: Emergency Medicine

## 2017-08-10 ENCOUNTER — Encounter: Payer: Self-pay | Admitting: Oncology

## 2017-08-10 DIAGNOSIS — F17210 Nicotine dependence, cigarettes, uncomplicated: Secondary | ICD-10-CM | POA: Insufficient documentation

## 2017-08-10 DIAGNOSIS — M25552 Pain in left hip: Secondary | ICD-10-CM | POA: Diagnosis not present

## 2017-08-10 DIAGNOSIS — C7951 Secondary malignant neoplasm of bone: Secondary | ICD-10-CM

## 2017-08-10 DIAGNOSIS — G893 Neoplasm related pain (acute) (chronic): Secondary | ICD-10-CM | POA: Diagnosis not present

## 2017-08-10 LAB — CBC WITH DIFFERENTIAL/PLATELET
BASOS ABS: 0.1 10*3/uL (ref 0–0.1)
BASOS PCT: 1 %
EOS PCT: 2 %
Eosinophils Absolute: 0.2 10*3/uL (ref 0–0.7)
HEMATOCRIT: 40.6 % (ref 40.0–52.0)
Hemoglobin: 14.3 g/dL (ref 13.0–18.0)
LYMPHS PCT: 34 %
Lymphs Abs: 2.8 10*3/uL (ref 1.0–3.6)
MCH: 30.9 pg (ref 26.0–34.0)
MCHC: 35.3 g/dL (ref 32.0–36.0)
MCV: 87.6 fL (ref 80.0–100.0)
MONO ABS: 0.9 10*3/uL (ref 0.2–1.0)
MONOS PCT: 11 %
NEUTROS ABS: 4.3 10*3/uL (ref 1.4–6.5)
Neutrophils Relative %: 52 %
Platelets: 251 10*3/uL (ref 150–440)
RBC: 4.64 MIL/uL (ref 4.40–5.90)
RDW: 13.3 % (ref 11.5–14.5)
WBC: 8.2 10*3/uL (ref 3.8–10.6)

## 2017-08-10 LAB — CK: Total CK: 62 U/L (ref 49–397)

## 2017-08-10 LAB — BASIC METABOLIC PANEL
ANION GAP: 9 (ref 5–15)
BUN: 19 mg/dL (ref 6–20)
CALCIUM: 8.6 mg/dL — AB (ref 8.9–10.3)
CO2: 23 mmol/L (ref 22–32)
CREATININE: 0.96 mg/dL (ref 0.61–1.24)
Chloride: 104 mmol/L (ref 101–111)
GFR calc Af Amer: >60 mL/min (ref >60)
GLUCOSE: 99 mg/dL (ref 65–99)
Potassium: 3.9 mmol/L (ref 3.5–5.1)
Sodium: 136 mmol/L (ref 135–145)

## 2017-08-10 MED ORDER — MIDAZOLAM HCL 5 MG/5ML IJ SOLN
INTRAMUSCULAR | Status: AC
  Filled 2017-08-10: qty 5

## 2017-08-10 MED ORDER — HYDROMORPHONE HCL 1 MG/ML IJ SOLN
1.0000 mg | Freq: Once | INTRAMUSCULAR | Status: AC
  Administered 2017-08-10: 1 mg via INTRAVENOUS
  Filled 2017-08-10: qty 1

## 2017-08-10 MED ORDER — OXYCODONE-ACETAMINOPHEN 5-325 MG PO TABS
1.0000 | ORAL_TABLET | ORAL | Status: AC
  Administered 2017-08-10: 1 via ORAL

## 2017-08-10 MED ORDER — MIDAZOLAM HCL 2 MG/2ML IJ SOLN
2.0000 mg | Freq: Once | INTRAMUSCULAR | Status: AC
Start: 2017-08-10 — End: 2017-08-10
  Administered 2017-08-10: 2 mg via INTRAVENOUS

## 2017-08-10 MED ORDER — OXYCODONE-ACETAMINOPHEN 5-325 MG PO TABS
ORAL_TABLET | ORAL | Status: AC
  Filled 2017-08-10: qty 1

## 2017-08-10 NOTE — ED Notes (Signed)
Pt tolerated ambulation with front-wheel walker. Pt really does not want to stay to get pain under control. Per visitor, pt is not taking the MS Contin as prescribed. Pt insists that he is. Visitor states she believes he is only taking it at night because he does not want to be asleep all day. Educated patient on appropriate prescribed medication and pain management. Pt verbalizes understanding.

## 2017-08-10 NOTE — ED Provider Notes (Signed)
[facility] Emergency Department Provider Note   ____________________________________________   None    (approximate)  I have reviewed the triage vital signs and the nursing notes.   HISTORY  Chief Complaint Hip Pain    HPI Larry Watkins is a 56 y.o. male who presents to the ED from home with a chief complaint of nontraumatic left hip pain. Patient has a history of renal cell carcinoma metastatic to bones. States he usually has pain in his right hip but since yesterday he started having severe pain to his left hip. Denies fall/injury/trauma. Has been taking his morphine and Percocet at home without improvement. Denies recent fever, chills, chest pain, shortness of breath, abdominal pain, nausea, vomiting. nothing makes his pain better. Movement makes his pain worse.   Past Medical History:  Diagnosis Date  . Bone metastasis ([place]) 07/17/2017  . Bone metastasis ([place]) 07/17/2017  . Metastatic renal cell carcinoma [facility])     Patient Active Problem List   Diagnosis Date Noted  . Goals of care, counseling/discussion 08/01/2017  . Metastatic renal cell carcinoma ([place]) 07/17/2017  . Bone metastasis ([place]) 07/17/2017  . Collapse of right lung 07/15/2017  . Renal cell carcinoma of right kidney ([place]) 07/14/2017    Past Surgical History:  Procedure Laterality Date  . FLEXIBLE BRONCHOSCOPY N/A 07/24/2017   Procedure: FLEXIBLE BRONCHOSCOPY;  Surgeon: [name], MD;  Location: [facility] ORS;  Service: Pulmonary;  Laterality: N/A;  . HERNIA REPAIR    . left middle amputation      with reattachment    Prior to Admission medications   Medication Sig Start Date End Date Taking? Authorizing Provider  morphine (MS CONTIN) 30 MG 12 hr tablet Take 1 tablet (30 mg total) by mouth every 12 (twelve) hours. 08/06/17  Yes Earlie Server, MD  oxyCODONE-acetaminophen (PERCOCET) 10-325 MG tablet Take 1 tablet by mouth every 6 (six) hours as needed for pain. 08/06/17  Yes Earlie Server,  MD    Allergies Patient has no known allergies.  Family History  Problem Relation Age of Onset  . Cancer Paternal Uncle        brain    Social History Social History  Substance Use Topics  . Smoking status: Current Every Day Smoker    Packs/day: 0.25    Years: 38.00  . Smokeless tobacco: Never Used  . Alcohol use No    Review of Systems  Constitutional: No fever/chills. Eyes: No visual changes. ENT: No sore throat. Cardiovascular: Denies chest pain. Respiratory: Denies shortness of breath. Gastrointestinal: No abdominal pain.  No nausea, no vomiting.  No diarrhea.  No constipation. Genitourinary: Negative for dysuria. Musculoskeletal: positive for left hip pain. Negative for back pain. Skin: Negative for rash. Neurological: Negative for headaches, focal weakness or numbness.   ____________________________________________   PHYSICAL EXAM:  VITAL SIGNS: ED Triage Vitals  Enc Vitals Group     BP 08/10/17 0248 (!) 146/90     Pulse Rate 08/10/17 0248 91     Resp 08/10/17 0248 20     Temp 08/10/17 0248 98.3 F (36.8 C)     Temp Source 08/10/17 0248 Oral     SpO2 08/10/17 0248 98 %     Weight 08/10/17 0243 241 lb (109.3 kg)     Height 08/10/17 0243 _0  (1.702 m)     Head Circumference --      Peak Flow --      Pain Score 08/10/17 0243 10  Pain Loc --      Pain Edu? --      Excl. in [place]? --     Constitutional: Alert and oriented. Well appearing and in moderate acute distress. Eyes: Conjunctivae are normal. PERRL. EOMI. Head: Atraumatic. Nose: No congestion/rhinnorhea. Mouth/Throat: Mucous membranes are moist.  Oropharynx non-erythematous. Neck: No stridor.  No cervical spine tenderness to palpation. Cardiovascular: Normal rate, regular rhythm. Grossly normal heart sounds.  Good peripheral circulation. Respiratory: Normal respiratory effort.  No retractions. Lungs CTAB. Gastrointestinal: Soft and nontender. No distention. No abdominal bruits. No CVA  tenderness. Musculoskeletal: Left posterior hip tender to palpation. Limited range of motion secondary to pain. Palpable muscle spasms. No pedal edema.  No joint effusions. Neurologic:  Normal speech and language. No gross focal neurologic deficits are appreciated.  Skin:  Skin is warm, dry and intact. No rash noted. Psychiatric: Mood and affect are normal. Speech and behavior are normal.  ____________________________________________   LABS (all labs ordered are listed, but only abnormal results are displayed)  Labs Reviewed  BASIC METABOLIC PANEL - Abnormal; Notable for the following:       Result Value   Calcium 8.6 (*)    All other components within normal limits  CBC WITH DIFFERENTIAL/PLATELET  CK   ____________________________________________  EKG  none ____________________________________________  RADIOLOGY  Ct Hip Left Wo Contrast  Result Date: 08/10/2017 CLINICAL DATA:  Renal cancer with bone metastasis. New onset severe pain to the left hip. EXAM: CT OF THE LEFT HIP WITHOUT CONTRAST TECHNIQUE: Multidetector CT imaging of the left hip was performed according to the standard protocol. Multiplanar CT image reconstructions were also generated. COMPARISON:  Left hip radiographs 08/10/2017 FINDINGS: Bones/Joint/Cartilage Poorly defined lucent lesions demonstrated in the greater trochanteric and intratrochanteric region of the left hip as well as in the left inferior pubic ramus and left acetabulum. These are consistent with the patient's known history of metastatic renal cell carcinoma. No pathologic fracture or dislocation is identified. Ligaments Suboptimally assessed by CT. Muscles and Tendons No intramuscular mass or hematoma identified. Soft tissues Visualized pelvic organs appear intact. Lymph nodes in the left groin and left pelvic regions are not pathologically enlarged. IMPRESSION: Lucent bone lesions in the left hip and left acetabulum consistent with known metastatic  disease. No evidence of pathologic fracture. Electronically Signed   By: Lucienne Capers M.D.   On: 08/10/2017 05:23   Dg Hip Unilat With Pelvis 2-3 Views Left  Result Date: 08/10/2017 CLINICAL DATA:  Left hip pain. History of cancer with bone metastasis. EXAM: DG HIP (WITH OR WITHOUT PELVIS) 2-3V LEFT COMPARISON:  CT abdomen pelvis 06/26/2017 FINDINGS: Lytic lesion involving the proximal femur shaft/low intertrochanteric region was better characterized on CT. There is mild thinning of the lateral cortex. No evidence of pathologic fracture. The left inferior pubic ramus fracture on CT is not well demonstrated radiographically. Femoral head is seated in the acetabulum. Flank lesion involving the right iliac bone as seen on prior CT. IMPRESSION: Lytic lesion in the proximal left femoral shaft/low intertrochanteric region, as seen on prior CT. Mild thinning of the lateral femoral cortex. No evidence pathologic fracture. Left inferior pubic ramus fracture on CT not well seen radiographically. Lytic lesion of the right iliac bone again seen. Electronically Signed   By: Jeb Levering M.D.   On: 08/10/2017 03:41    ____________________________________________   PROCEDURES  Procedure(s) performed: None  Procedures  Critical Care performed: No  ____________________________________________   INITIAL IMPRESSION / ASSESSMENT AND PLAN /  ED COURSE  Pertinent labs & imaging results that were available during my care of the patient were reviewed by me and considered in my medical decision making (see chart for details).  56 year old male with renal cell carcinoma metastatic to bones who presents with nontraumatic left hip pain. X-rays grossly negative for acute fracture but unable to visualize pelvis. Will proceed to CT scan.  Clinical Course as of Aug 11 743  Sun Aug 10, 2017  3817 Minimal relief after IV Dilaudid. Will try muscle relaxer and obtain CT hip.  [JS]  [id number] Updated patient and spouse  on CT imaging results. Patient still appears uncomfortable after Versed and additional dose of IV Dilaudid. Discussed with patient and spouse hospital admission for intractable pain. Patient would much rather prefer to be discharged home. Will ambulate him with a walker and see how he does.  [JS]  [id number] Care transferred to Dr. Jacqualine Code pending ambulation trial.  [JS]    Clinical Course User Index [JS] Paulette Blanch, MD     ____________________________________________   FINAL CLINICAL IMPRESSION(S) / ED DIAGNOSES  Final diagnoses:  Left hip pain  Pain from bone metastases ([place])      NEW MEDICATIONS STARTED DURING THIS VISIT:  New Prescriptions   No medications on file     Note:  This document was prepared using Dragon voice recognition software and may include unintentional dictation errors.    Paulette Blanch, MD 08/10/17 [phone]

## 2017-08-10 NOTE — ED Provider Notes (Signed)
Patient ambulating with stable but slightly antalgic gait using walker. Reports his pain is improving, but it is starting to return some now in both hips. He is fully awake and alert with his wife at the bedside. His pain he reports is from his cancer, this is managed by Dr. Tasia Catchings. He is requesting 1 more Percocet tablet for a leaves, I discussed with him and think this is reasonable. He will be calling Dr. Tasia Catchings tomorrow. His wife is driving. He is not driving today.  Vitals:   08/10/17 0505 08/10/17 0721  BP: (!) 159/95 (!) 145/83  Pulse: 84 79  Resp: 18 18  Temp:    SpO2: 93% 96%      Delman Kitten, MD 08/10/17 [phone]

## 2017-08-10 NOTE — ED Triage Notes (Signed)
Patient has kidney cancer that has metastases to his bones. Patient states that about an hour ago he started having severe pain to his left hip. Patient states that he took percocet and morphine at home with no improvement.

## 2017-08-10 NOTE — Discharge Instructions (Signed)
Continue pain regimen as directed by your doctor. Return to the ER for worsening symptoms, persistent vomiting, difficulty breathing or other concerns.

## 2017-08-10 NOTE — ED Notes (Signed)
AAOx3.  Skin warm and dry. NAD.  Ambulates independently with walker.

## 2017-08-11 ENCOUNTER — Encounter
Admission: RE | Admit: 2017-08-11 | Discharge: 2017-08-11 | Disposition: A | Discharge status: Home / Self Care | Payer: Medicaid Other | Source: Ambulatory Visit | Attending: Radiation Oncology | Admitting: Radiation Oncology

## 2017-08-11 ENCOUNTER — Encounter: Payer: Self-pay | Admitting: Oncology

## 2017-08-11 ENCOUNTER — Ambulatory Visit
Admission: RE | Admit: 2017-08-11 | Discharge: 2017-08-11 | Disposition: A | Discharge status: Home / Self Care | Payer: Medicaid Other | Source: Ambulatory Visit | Attending: Radiation Oncology | Admitting: Radiation Oncology

## 2017-08-11 ENCOUNTER — Inpatient Hospital Stay (HOSPITAL_BASED_OUTPATIENT_CLINIC_OR_DEPARTMENT_OTHER): Payer: Medicaid Other | Admitting: Oncology

## 2017-08-11 VITALS — BP 138/78 | HR 80 | Temp 98.1°F

## 2017-08-11 DIAGNOSIS — F17210 Nicotine dependence, cigarettes, uncomplicated: Secondary | ICD-10-CM

## 2017-08-11 DIAGNOSIS — R109 Unspecified abdominal pain: Secondary | ICD-10-CM | POA: Diagnosis not present

## 2017-08-11 DIAGNOSIS — G893 Neoplasm related pain (acute) (chronic): Secondary | ICD-10-CM | POA: Diagnosis not present

## 2017-08-11 DIAGNOSIS — C641 Malignant neoplasm of right kidney, except renal pelvis: Secondary | ICD-10-CM | POA: Diagnosis not present

## 2017-08-11 DIAGNOSIS — C7801 Secondary malignant neoplasm of right lung: Secondary | ICD-10-CM | POA: Diagnosis not present

## 2017-08-11 DIAGNOSIS — C649 Malignant neoplasm of unspecified kidney, except renal pelvis: Secondary | ICD-10-CM | POA: Insufficient documentation

## 2017-08-11 DIAGNOSIS — R05 Cough: Secondary | ICD-10-CM | POA: Diagnosis not present

## 2017-08-11 DIAGNOSIS — M25511 Pain in right shoulder: Secondary | ICD-10-CM | POA: Diagnosis not present

## 2017-08-11 DIAGNOSIS — C7951 Secondary malignant neoplasm of bone: Secondary | ICD-10-CM | POA: Diagnosis not present

## 2017-08-11 DIAGNOSIS — M791 Myalgia: Secondary | ICD-10-CM | POA: Diagnosis not present

## 2017-08-11 DIAGNOSIS — Z510 Encounter for antineoplastic radiation therapy: Secondary | ICD-10-CM | POA: Diagnosis not present

## 2017-08-11 DIAGNOSIS — Z5112 Encounter for antineoplastic immunotherapy: Secondary | ICD-10-CM | POA: Diagnosis not present

## 2017-08-11 DIAGNOSIS — R0602 Shortness of breath: Secondary | ICD-10-CM

## 2017-08-11 MED ORDER — MORPHINE SULFATE ER 15 MG PO TBCR: 15.0000 mg | EXTENDED_RELEASE_TABLET | Freq: Two times a day (BID) | ORAL | 0 refills | Status: DC

## 2017-08-11 MED ORDER — GABAPENTIN 300 MG PO CAPS: 300.0000 mg | ORAL_CAPSULE | Freq: Three times a day (TID) | ORAL | 0 refills | Status: DC

## 2017-08-11 MED ORDER — TECHNETIUM TC 99M MEDRONATE IV KIT
25.0000 | PACK | Freq: Once | INTRAVENOUS | Status: AC | PRN
  Administered 2017-08-11: 22.58 via INTRAVENOUS

## 2017-08-11 MED ORDER — OXYCODONE-ACETAMINOPHEN 10-325 MG PO TABS: 1.0000 | ORAL_TABLET | Freq: Four times a day (QID) | ORAL | 0 refills | Status: DC | PRN

## 2017-08-11 NOTE — Progress Notes (Signed)
Patient here today for a acute visit for uncontrolled pain.  Pain is in left hip today, 7 out of 10.

## 2017-08-11 NOTE — Progress Notes (Signed)
[facility] follow up visit  Date of visit: 08/11/17 CHIEF COMPLAINTS/PURPOSE OF CONSULTATION: Evaluation before cycle 1 Nivolumab and Ipilimumab  HISTORY OF PRESENTING ILLNESS: 1[name] 56 y.o. male with 40 pack year smoking histroy is here for evaluation of abnormal image finding. Patient has had right shoulder pain  three months after reaching below a bar to grab a glass. Patient is not able to raise his right arm over shoulder level.  Also having left flank pain and 2 weeks ago he has had pink urine for a few days and resolved after taking a course of amoxicillin. He is otherwise healthy and does not take any medication.  Patient has 40 pack year smoking history.    2 Images:  06/26/2017 CT scan showed a concerning right kidney mass with possible metastatic disease with thoracic and abdominal lymphadenopathy, lung nodules, and diffuse osseous metastases. 07/15/2017 CT PE protocol for evaluation of shortness of breath or chest pain showed no PE, however right lung collapse and rapid progression of lung adenopathy.   3 Pathology:     07/10/2017 07/24/2017 A. Lung, right upper lobe, endobronchial biopsy DIAGNOSIS: A. LUNG, RIGHT UPPER LOBE; ENDOBRONCHIAL BIOPSY: - METASTATIC CARCINOMA CONSISTENT WITH CLEAR CELL RENAL CELL CARCINOMA    Bronchoscopy on 07/24/2017 and biopsy revealed metastatic renal cell cancer.   4 Treatment started on 1st line treatment with nivolumab and ipilimumab on 07/22/2017.    Also started on palliative RT to lung and involved bone mets,       INTERVAL HISTORY Patient is accompanied with his significant other to clinic for follow up after his ED visit this past weekend due to uncontrolled hip pain. CT hip showed lucent bone lesions in the left hip and left acetabulum consistent with known metastatic disease. No evidence of pathological fracture.  He has bone scan today and was planned to have simulation this Thursday for RT to his bone mets.   Patient says that he takes MS contin 69m BID and he has almost ran out of his percocet due to the uncontrolled pain. I dispense 40 tablets on 08/06/2017 and he only has 2 tablets. He appears taking 7-8 tablets on average per day due to the stabbing pain.    Review of Systems  Constitutional: Negative.   HENT:  Negative.   Eyes: Negative.   Respiratory: Positive for shortness of breath.        Chest pain with deep breathing.   Cardiovascular: Negative.   Gastrointestinal: Negative.   Endocrine: Negative.   Musculoskeletal: Positive for arthralgias and flank pain.       Right hip pain.  Skin: Negative.   Neurological: Negative.   Hematological: Negative.   Psychiatric/Behavioral: Negative.     MEDICAL HISTORY: Past Medical History:  Diagnosis Date  . [place]/17/[place]  . [place] metastasis ([facility] [place] 07/17/2017  . Metastatic renal cell carcinoma ([facility]     SURGICAL HISTORY: Past Surgical History:  Procedure Laterality Date  . FLEXIBLE BRONCHOSCOPY N/A 07/24/2017   Procedure: FLEXIBLE BRONCHOSCOPY;  Surgeon: SWilhelmina Mcardle MD;  Location: [facility] ORS;  Service: Pulmonary;  Laterality: N/A;  . HERNIA REPAIR    . left middle amputation      with reattachment    SOCIAL HISTORY: Social History   Social History  . Marital status: Single    Spouse name: N/A  . Number of children: N/A  . Years of education: N/A   Occupational History  . Not on file.  Social History Main Topics  . Smoking status: Current Every Day Smoker    Packs/day: 0.25    Years: 38.00  . Smokeless tobacco: Never Used  . Alcohol use No  . Drug use: No  . Sexual activity: Not on file   Other Topics Concern  . Not on file   Social History Narrative  . No narrative on file    FAMILY HISTORY Family History  Problem Relation Age of Onset  . Cancer Paternal Uncle        brain    ALLERGIES:  has No Known Allergies.  MEDICATIONS:  Current Outpatient Prescriptions  Medication Sig  Dispense Refill  . morphine (MS CONTIN) 30 MG 12 hr tablet Take 1 tablet (30 mg total) by mouth every 12 (twelve) hours. 14 tablet 0  . oxyCODONE-acetaminophen (PERCOCET) 10-325 MG tablet Take 1 tablet by mouth every 6 (six) hours as needed for pain. 40 tablet 0   No current facility-administered medications for this visit.    Vitals:   08/11/17 1041  BP: 138/78  Pulse: 80  Temp: 98.1 F (36.7 C)   Physical Exam ECOG 1 GENERAL: No distress, well nourished.  SKIN:  No rashes or significant lesions  HEAD: Normocephalic, No masses, lesions, tenderness or abnormalities  EYES: Conjunctiva are pink, non icteric ENT: External ears normal ,lips , buccal mucosa, and tongue normal and mucous membranes are moist  LYMPH: No palpable cervical and axillary lymphadenopathy  LUNGS: Clear to auscultation, no crackles or wheezes HEART: Regular rate & rhythm, no murmurs, no gallops, S1 normal and S2 normal  ABDOMEN: Abdomen soft, non-tender, normal bowel sounds, I did not appreciate any  masses or organomegaly  MUSCULOSKELETAL: No CVA tenderness and no tenderness on percussion of the back or rib cage. Right scapular blade palpable mass. Right hip motion limited due to pain. Full examination deferred.  EXTREMITIES: No edema, no skin discoloration or tenderness NEURO: Alert & oriented, no focal motor/sensory deficits.  LABORATORY DATA: I have personally reviewed the data as listed: CBC    Component Value Date/Time   WBC 8.2 08/10/2017 0259   RBC 4.64 08/10/2017 0259   HGB 14.3 08/10/2017 0259   HCT 40.6 08/10/2017 0259   PLT 251 08/10/2017 0259   MCV 87.6 08/10/2017 0259   MCH 30.9 08/10/2017 0259   MCHC 35.3 08/10/2017 0259   RDW 13.3 08/10/2017 0259   LYMPHSABS 2.8 08/10/2017 0259   MONOABS 0.9 08/10/2017 0259   EOSABS 0.2 08/10/2017 0259   BASOSABS 0.1 08/10/2017 0259   CMP Latest Ref Rng & Units 08/10/2017 08/06/2017 07/30/2017  Glucose 65 - 99 mg/dL 99 92 104(H)  BUN 6 - 20 mg/dL _0 Creatinine 0.61 - 1.24 mg/dL 0.96 1.05 0.99  Sodium 135 - 145 mmol/L 136 137 136  Potassium 3.5 - 5.1 mmol/L 3.9 4.5 4.9  Chloride 101 - 111 mmol/L 104 102 99(L)  CO2 22 - 32 mmol/L _1 Calcium 8.9 - 10.3 mg/dL 8.6(L) 9.0 9.2  Total Protein 6.5 - 8.1 g/dL - 7.1 7.7  Total Bilirubin 0.3 - 1.2 mg/dL - 0.2(L) 0.4  Alkaline Phos 38 - 126 U/L - 156(H) 148(H)  AST 15 - 41 U/L - 14(L) 17  ALT 17 - 63 U/L - 15(L) 18    RADIOGRAPHIC STUDIES: I have personally reviewed the radiological images as listed and agree with the findings in the report CT chest abdomen pelvis 06/26/2017  IMPRESSION: 1. 7 cm right lower pole  renal mass. This may reflect urothelial carcinoma given involvement of the collecting system versus renal cell carcinoma. 2. Metastatic disease with thoracic and abdominal lymphadenopathy, lung nodules, and diffuse osseous metastases. 3. Subcentimeter lesion in the right upper lobe bronchus with diffuse abnormal right upper lobe bronchial wall soft tissue thickening.  07/15/2017 No evidence of pulmonary emboli. Attenuation of the pulmonary arterial branches is noted related to hilar adenopathy. Increase in the size of the central right hilar mass with increase in mediastinal and hilar adenopathy when compared with the recent exam. New right upper lobe collapse secondary to bronchial occlusion.Additionally some patchy infiltrative changes noted in the lower lobe likely related to the acute infiltrate. Diffuse bony metastatic disease with evidence of pathologic fracture involving the left tenth rib lesion.  Pathology 07/10/2017 Surgical Pathology  CASE: [id number]  PATIENT: Larry Watkins  Surgical Pathology Report  DIAGNOSIS:  A. LYTIC BONE LESION, RIGHT ILIAC CREST; CT-GUIDED BIOPSY:  - METASTATIC CARCINOMA, PAX-8 POSITIVE, COMPATIBLE WITH METASTATIC CLEAR  CELL RENAL CELL CARCINOMA.    Surgical Pathology 07/24/2017 CASE: [id number]  SPECIMEN SUBMITTED:  A. Lung,  right upper lobe, endobronchial biopsy  DIAGNOSIS:  A. LUNG, RIGHT UPPER LOBE; ENDOBRONCHIAL BIOPSY:  - METASTATIC CARCINOMA CONSISTENT WITH CLEAR CELL RENAL CELL CARCINOMA  07/24/2017 Cytology - Non PAP  CASE: [id number]  SPECIMEN SUBMITTED:  A. Lung, right upper lobe; bronchoscopy brushing  DIAGNOSIS:  A. LUNG, RIGHT UPPER LOBE; BRONCHOSCOPY BRUSHING:  - POSITIVE FOR MALIGNANCY.  - METASTATIC CARCINOMA CONSISTENT WITH CLEAR CELL RENAL CELL CARCINOMA  ASSESSMENT/PLAN Cancer Staging Renal cell carcinoma of right kidney [facility]) Staging form: Kidney, AJCC 8th Edition - Clinical stage from 07/14/2017: Stage IV (cT3a, cNX, pM1) - Signed by Earlie Server, MD on 07/15/2017 - Pathologic: No stage assigned - Unsigned  1. Neoplasm related pain   2. Metastatic renal cell carcinoma, unspecified laterality ([place])   3. Metastasis to bone [facility])    # s/p cycle 1. Tolerate well. # Xgeva every 28 days for metastatic bone disease, discussed with patient about side effects including jaw necrosis and he is willing to proceed. Patient currently denies any ongoing dental work or dental complaints. Advise patient to obtain dental evaluation. He voices understanding.   #increase pain regimen to  MS contin to 45 mg BID dispense 20 tabs and refilled percocet Percocet 10/325 Q 6 hour PRN dispense 40 tabs. . He reports no constipation or drowsiness. Again I emphasize that he should follow the instruction and not to take extra pain pills. I will see him again this week and will reevaluate his pain. Also start Neurontin 355m TID. # following radiation oncology for palliative RT for symptom control.  # smoke cessation discussed in details. He is only smoking 5 cigarettes daily. Encourage him to continue weaning down smoking.    No orders of the defined types were placed in this encounter.  Follow-up on in 1 week, on 08/13/2017, on day 1 of cycle 2 immunotherapy.   All questions were answered. The patient knows to call  the clinic with any problems, questions or concerns..Earlie Server MD  08/11/2017 10:10 AM

## 2017-08-12 ENCOUNTER — Ambulatory Visit
Admission: RE | Admit: 2017-08-12 | Discharge: 2017-08-12 | Disposition: A | Discharge status: Home / Self Care | Payer: Medicaid Other | Source: Ambulatory Visit | Attending: Radiation Oncology | Admitting: Radiation Oncology

## 2017-08-12 ENCOUNTER — Telehealth: Payer: Self-pay | Admitting: *Deleted

## 2017-08-12 ENCOUNTER — Ambulatory Visit: Admit: 2017-08-12 | Discharge: 2017-08-12 | Admitting: Urology

## 2017-08-12 DIAGNOSIS — Z510 Encounter for antineoplastic radiation therapy: Secondary | ICD-10-CM | POA: Diagnosis not present

## 2017-08-12 DIAGNOSIS — C649 Malignant neoplasm of unspecified kidney, except renal pelvis: Principal | ICD-10-CM

## 2017-08-12 NOTE — Telephone Encounter (Signed)
I attempted to call  patient and got voice mail. He was given prescription for #40  Tabs of Oxycodone yesterdayI called Ana in [facility] and she said that he told her that pharmacy would not fill prescription because it is too early.  Patient then returned my call and I explained to him that we cannot make the pharmacy fill prescription early due to government regulations and the fact that he is using more medicine that ordered. He said he did not want to talk to me anymore and thanked me for my time.

## 2017-08-12 NOTE — Telephone Encounter (Signed)
-----   Message from Elouise Munroe sent at 08/12/2017  9:30 AM EDT ----- Regarding: RX Contact: [phone] Pt needs refill on Oxycodone- Dr Tasia Catchings stated that shed give 2 pills to last until RX ran out tomorrow...  Please call pt or wife

## 2017-08-13 ENCOUNTER — Encounter: Payer: Self-pay | Admitting: Oncology

## 2017-08-13 ENCOUNTER — Inpatient Hospital Stay: Payer: Medicaid Other

## 2017-08-13 ENCOUNTER — Inpatient Hospital Stay (HOSPITAL_BASED_OUTPATIENT_CLINIC_OR_DEPARTMENT_OTHER): Payer: Medicaid Other | Admitting: Oncology

## 2017-08-13 ENCOUNTER — Ambulatory Visit
Admission: RE | Admit: 2017-08-13 | Discharge: 2017-08-13 | Disposition: A | Discharge status: Home / Self Care | Payer: Medicaid Other | Source: Ambulatory Visit | Attending: Radiation Oncology | Admitting: Radiation Oncology

## 2017-08-13 ENCOUNTER — Ambulatory Visit: Admit: 2017-08-13 | Discharge: 2017-08-13 | Disposition: A | Discharge status: Home / Self Care

## 2017-08-13 VITALS — BP 133/84 | HR 83 | Resp 18

## 2017-08-13 VITALS — BP 126/72 | HR 96 | Temp 98.2°F | Wt 239.1 lb

## 2017-08-13 DIAGNOSIS — C7801 Secondary malignant neoplasm of right lung: Secondary | ICD-10-CM | POA: Diagnosis not present

## 2017-08-13 DIAGNOSIS — C649 Malignant neoplasm of unspecified kidney, except renal pelvis: Secondary | ICD-10-CM

## 2017-08-13 DIAGNOSIS — R05 Cough: Secondary | ICD-10-CM

## 2017-08-13 DIAGNOSIS — R0602 Shortness of breath: Secondary | ICD-10-CM

## 2017-08-13 DIAGNOSIS — R109 Unspecified abdominal pain: Secondary | ICD-10-CM

## 2017-08-13 DIAGNOSIS — C799 Secondary malignant neoplasm of unspecified site: Secondary | ICD-10-CM

## 2017-08-13 DIAGNOSIS — G893 Neoplasm related pain (acute) (chronic): Secondary | ICD-10-CM | POA: Diagnosis not present

## 2017-08-13 DIAGNOSIS — Z510 Encounter for antineoplastic radiation therapy: Secondary | ICD-10-CM | POA: Diagnosis not present

## 2017-08-13 DIAGNOSIS — M791 Myalgia: Secondary | ICD-10-CM | POA: Diagnosis not present

## 2017-08-13 DIAGNOSIS — Z5112 Encounter for antineoplastic immunotherapy: Secondary | ICD-10-CM | POA: Diagnosis not present

## 2017-08-13 DIAGNOSIS — M25511 Pain in right shoulder: Secondary | ICD-10-CM | POA: Diagnosis not present

## 2017-08-13 DIAGNOSIS — C7951 Secondary malignant neoplasm of bone: Secondary | ICD-10-CM

## 2017-08-13 DIAGNOSIS — J9811 Atelectasis: Secondary | ICD-10-CM

## 2017-08-13 DIAGNOSIS — C641 Malignant neoplasm of right kidney, except renal pelvis: Secondary | ICD-10-CM

## 2017-08-13 DIAGNOSIS — F17210 Nicotine dependence, cigarettes, uncomplicated: Secondary | ICD-10-CM | POA: Diagnosis not present

## 2017-08-13 DIAGNOSIS — R042 Hemoptysis: Secondary | ICD-10-CM

## 2017-08-13 LAB — CBC WITH DIFFERENTIAL/PLATELET
BASOS ABS: 0.1 10*3/uL (ref 0–0.1)
Basophils Relative: 1 %
Eosinophils Absolute: 0.1 10*3/uL (ref 0–0.7)
Eosinophils Relative: 1 %
HEMATOCRIT: 40.5 % (ref 40.0–52.0)
Hemoglobin: 14.3 g/dL (ref 13.0–18.0)
LYMPHS ABS: 1.3 10*3/uL (ref 1.0–3.6)
Lymphocytes Relative: 19 %
MCH: 31 pg (ref 26.0–34.0)
MCHC: 35.4 g/dL (ref 32.0–36.0)
MCV: 87.6 fL (ref 80.0–100.0)
MONO ABS: 0.5 10*3/uL (ref 0.2–1.0)
MONOS PCT: 7 %
NEUTROS PCT: 72 %
Neutro Abs: 4.9 10*3/uL (ref 1.4–6.5)
Platelets: 246 10*3/uL (ref 150–440)
RBC: 4.62 MIL/uL (ref 4.40–5.90)
RDW: 13.1 % (ref 11.5–14.5)
WBC: 6.9 10*3/uL (ref 3.8–10.6)

## 2017-08-13 LAB — COMPREHENSIVE METABOLIC PANEL
ALK PHOS: 150 U/L — AB (ref 38–126)
ALT: 15 U/L — AB (ref 17–63)
AST: 15 U/L (ref 15–41)
Albumin: 3.5 g/dL (ref 3.5–5.0)
Anion gap: 7 (ref 5–15)
BILIRUBIN TOTAL: 0.6 mg/dL (ref 0.3–1.2)
BUN: 14 mg/dL (ref 6–20)
CO2: 28 mmol/L (ref 22–32)
CREATININE: 1.07 mg/dL (ref 0.61–1.24)
Calcium: 9 mg/dL (ref 8.9–10.3)
Chloride: 101 mmol/L (ref 101–111)
GFR calc Af Amer: >60 mL/min (ref >60)
Glucose, Bld: 127 mg/dL — ABNORMAL HIGH (ref 65–99)
Potassium: 4.3 mmol/L (ref 3.5–5.1)
Sodium: 136 mmol/L (ref 135–145)
TOTAL PROTEIN: 7.4 g/dL (ref 6.5–8.1)

## 2017-08-13 LAB — TSH: TSH: 1.981 u[IU]/mL (ref 0.350–4.500)

## 2017-08-13 MED ORDER — SODIUM CHLORIDE 0.9 % IV SOLN
Freq: Once | INTRAVENOUS | Status: AC
  Administered 2017-08-13: 10:00:00 via INTRAVENOUS
  Filled 2017-08-13: qty 1000

## 2017-08-13 MED ORDER — SODIUM CHLORIDE 0.9 % IV SOLN
100.0000 mg | Freq: Once | INTRAVENOUS | Status: AC
  Administered 2017-08-13: 100 mg via INTRAVENOUS
  Filled 2017-08-13: qty 20

## 2017-08-13 MED ORDER — OXYCODONE-ACETAMINOPHEN 5-325 MG PO TABS
1.0000 | ORAL_TABLET | Freq: Once | ORAL | Status: AC
  Administered 2017-08-13: 1 via ORAL
  Filled 2017-08-13: qty 1

## 2017-08-13 MED ORDER — DENOSUMAB 120 MG/1.7ML ~~LOC~~ SOLN
120.0000 mg | Freq: Once | SUBCUTANEOUS | Status: AC
  Administered 2017-08-13: 120 mg via SUBCUTANEOUS
  Filled 2017-08-13: qty 1.7

## 2017-08-13 MED ORDER — SODIUM CHLORIDE 0.9 % IV SOLN
340.0000 mg | Freq: Once | INTRAVENOUS | Status: AC
  Administered 2017-08-13: 340 mg via INTRAVENOUS
  Filled 2017-08-13: qty 24

## 2017-08-13 NOTE — Progress Notes (Signed)
[facility] up visit  Date of visit: 08/13/17 CHIEF COMPLAINTS/PURPOSE OF CONSULTATION: Evaluation before cycle 1 Nivolumab and Ipilimumab  HISTORY OF PRESENTING ILLNESS: 1[name] 56 y.o. male with 40 pack year smoking histroy is here for evaluation of abnormal image finding. Patient has had right shoulder pain  three months after reaching below a bar to grab a glass. Patient is not able to raise his right arm over shoulder level.  Also having left flank pain and 2 weeks ago he has had pink urine for a few days and resolved after taking a course of amoxicillin. He is otherwise healthy and does not take any medication.  Patient has 40 pack year smoking history.    2 Images:  06/26/2017 CT scan showed a concerning right kidney mass with possible metastatic disease with thoracic and abdominal lymphadenopathy, lung nodules, and diffuse osseous metastases. 07/15/2017 CT PE protocol for evaluation of shortness of breath or chest pain showed no PE, however right lung collapse and rapid progression of lung adenopathy.  08/10/2017 ED visit due to uncontrolled pain: CT hip showed lucent bone lesions in the left hip and left acetabulum consistent with known metastatic disease. No evidence of pathological fracture.  08/11/2017 Bone scan: FINDINGS:Numerous areas of abnormal bony uptake noted in the skull, bilateral scapulae, sternum, cervical and thoracic spine, posterior left tenth rib, and right iliac bone compatible with metastatic disease. Degenerative uptake in the ankles and feet. Soft tissue activity unremarkable. IMPRESSION:Extensive osseous metastatic disease as above.  3 Pathology:     07/10/2017 07/24/2017 A. Lung, right upper lobe, endobronchial biopsy DIAGNOSIS: A. LUNG, RIGHT UPPER LOBE; ENDOBRONCHIAL BIOPSY: - METASTATIC CARCINOMA CONSISTENT WITH CLEAR CELL RENAL CELL CARCINOMA    Bronchoscopy on 07/24/2017 and biopsy revealed metastatic renal cell cancer.   4  Treatment started on 1st line treatment with nivolumab and ipilimumab on 07/22/2017.    Also started on palliative RT to lung and involved bone mets     5 Pain medication:  Patient finished one month supply of percocet in about one week. So his pain medication Rx are being dispensed weekly and he gets one week supply at one time. Multiple discussion about importance of taking pain medication as instructed.  Patient says that he takes MS contin 81m BID and he has almost ran out of his percocet due to the uncontrolled hip pain. I dispense 40 tablets on 08/06/2017 and he only has 2 tablets left. He admits taking 7-8 tablets on average per day due to the stabbing pain. MS contin was increased to 466mBID. Refilled his percocet 40 tablets on 08/11/2017 ans also started him on Neurontin.   INTERVAL HISTORY Patient is accompanied with his significant other to clinic for evaluation prior to cycle 2 Nivolumab and ipilimumab.  He is getting palliative RT to his lung and will be started for RT to bone mets. Reports SOB is better.     Review of Systems  Constitutional: Negative.   HENT:  Negative.   Eyes: Negative.   Respiratory: Negative.        Chest pain with deep breathing.   Cardiovascular: Negative.   Gastrointestinal: Negative.   Endocrine: Negative.   Musculoskeletal: Positive for arthralgias and flank pain.       Right hip pain.  Skin: Negative.   Neurological: Negative.   Hematological: Negative.   Psychiatric/Behavioral: Negative.     MEDICAL HISTORY: Past Medical History:  Diagnosis Date  . [place]  .  Bone metastasis ([place]) 07/17/2017  . Metastatic renal cell carcinoma ([place])     SURGICAL HISTORY: Past Surgical History:  Procedure Laterality Date  . FLEXIBLE BRONCHOSCOPY N/A 07/24/2017   Procedure: FLEXIBLE BRONCHOSCOPY;  Surgeon: [name], MD;  Location: [facility] ORS;  Service: Pulmonary;  Laterality: N/A;  . HERNIA REPAIR    . left middle amputation       with reattachment    SOCIAL HISTORY: Social History   Social History  . Marital status: Single    Spouse name: N/A  . Number of children: N/A  . Years of education: N/A   Occupational History  . Not on file.   Social History Main Topics  . Smoking status: Current Every Day Smoker    Packs/day: 0.25    Years: 38.00  . Smokeless tobacco: Never Used  . Alcohol use No  . Drug use: No  . Sexual activity: Not on file   Other Topics Concern  . Not on file   Social History Narrative  . No narrative on file    FAMILY HISTORY Family History  Problem Relation Age of Onset  . Cancer Paternal Uncle        brain    ALLERGIES:  has No Known Allergies.  MEDICATIONS:  Current Outpatient Prescriptions  Medication Sig Dispense Refill  . gabapentin (NEURONTIN) 300 MG capsule Take 1 capsule (300 mg total) by mouth 3 (three) times daily. 90 capsule 0  . morphine (MS CONTIN) 15 MG 12 hr tablet Take 1 tablet (15 mg total) by mouth every 12 (twelve) hours. 20 tablet 0  . morphine (MS CONTIN) 30 MG 12 hr tablet Take 1 tablet (30 mg total) by mouth every 12 (twelve) hours. 14 tablet 0  . oxyCODONE-acetaminophen (PERCOCET) 10-325 MG tablet Take 1 tablet by mouth every 6 (six) hours as needed for pain. 40 tablet 0   No current facility-administered medications for this visit.    There were no vitals filed for this visit. Physical Exam ECOG 1 GENERAL: No distress, well nourished.  SKIN:  No rashes or significant lesions  HEAD: Normocephalic, No masses, lesions, tenderness or abnormalities  EYES: Conjunctiva are pink, non icteric ENT: External ears normal ,lips , buccal mucosa, and tongue normal and mucous membranes are moist  LYMPH: No palpable cervical and axillary lymphadenopathy  LUNGS: Clear to auscultation, no crackles or wheezes HEART: Regular rate & rhythm, no murmurs, no gallops, S1 normal and S2 normal  ABDOMEN: Abdomen soft, non-tender, normal bowel sounds, I did not  appreciate any  masses or organomegaly  MUSCULOSKELETAL: No CVA tenderness and no tenderness on percussion of the back or rib cage. Right scapular blade palpable mass. Right hip motion limited due to pain. Full examination deferred.  EXTREMITIES: No edema, no skin discoloration or tenderness NEURO: Alert & oriented, no focal motor/sensory deficits.  LABORATORY DATA: I have personally reviewed the data as listed: CBC    Component Value Date/Time   WBC 6.9 08/13/2017 0825   RBC 4.62 08/13/2017 0825   HGB 14.3 08/13/2017 0825   HCT 40.5 08/13/2017 0825   PLT 246 08/13/2017 0825   MCV 87.6 08/13/2017 0825   MCH 31.0 08/13/2017 0825   MCHC 35.4 08/13/2017 0825   RDW 13.1 08/13/2017 0825   LYMPHSABS 1.3 08/13/2017 0825   MONOABS 0.5 08/13/2017 0825   EOSABS 0.1 08/13/2017 0825   BASOSABS 0.1 08/13/2017 0825   CMP Latest Ref Rng & Units 08/10/2017 08/06/2017 07/30/2017  Glucose 65 - 99  mg/dL 99 92 104(H)  BUN 6 - 20 mg/dL _0 Creatinine 0.61 - 1.24 mg/dL 0.96 1.05 0.99  Sodium 135 - 145 mmol/L 136 137 136  Potassium 3.5 - 5.1 mmol/L 3.9 4.5 4.9  Chloride 101 - 111 mmol/L 104 102 99(L)  CO2 22 - 32 mmol/L _1 Calcium 8.9 - 10.3 mg/dL 8.6(L) 9.0 9.2  Total Protein 6.5 - 8.1 g/dL - 7.1 7.7  Total Bilirubin 0.3 - 1.2 mg/dL - 0.2(L) 0.4  Alkaline Phos 38 - 126 U/L - 156(H) 148(H)  AST 15 - 41 U/L - 14(L) 17  ALT 17 - 63 U/L - 15(L) 18    RADIOGRAPHIC STUDIES: I have personally reviewed the radiological images as listed and agree with the findings in the report CT chest abdomen pelvis 06/26/2017  IMPRESSION: 1. 7 cm right lower pole renal mass. This may reflect urothelial carcinoma given involvement of the collecting system versus renal cell carcinoma. 2. Metastatic disease with thoracic and abdominal lymphadenopathy, lung nodules, and diffuse osseous metastases. 3. Subcentimeter lesion in the right upper lobe bronchus with diffuse abnormal right upper lobe bronchial wall  soft tissue thickening.  07/15/2017 No evidence of pulmonary emboli. Attenuation of the pulmonary arterial branches is noted related to hilar adenopathy. Increase in the size of the central right hilar mass with increase in mediastinal and hilar adenopathy when compared with the recent exam. New right upper lobe collapse secondary to bronchial occlusion.Additionally some patchy infiltrative changes noted in the lower lobe likely related to the acute infiltrate. Diffuse bony metastatic disease with evidence of pathologic fracture involving the left tenth rib lesion.  Pathology 07/10/2017 Surgical Pathology  CASE: [id number]  PATIENT: Larry Watkins  Surgical Pathology Report  DIAGNOSIS:  A. LYTIC BONE LESION, RIGHT ILIAC CREST; CT-GUIDED BIOPSY:  - METASTATIC CARCINOMA, PAX-8 POSITIVE, COMPATIBLE WITH METASTATIC CLEAR  CELL RENAL CELL CARCINOMA.    Surgical Pathology 07/24/2017 CASE: [id number]  SPECIMEN SUBMITTED:  A. Lung, right upper lobe, endobronchial biopsy  DIAGNOSIS:  A. LUNG, RIGHT UPPER LOBE; ENDOBRONCHIAL BIOPSY:  - METASTATIC CARCINOMA CONSISTENT WITH CLEAR CELL RENAL CELL CARCINOMA  07/24/2017 Cytology - Non PAP  CASE: [id number]  SPECIMEN SUBMITTED:  A. Lung, right upper lobe; bronchoscopy brushing  DIAGNOSIS:  A. LUNG, RIGHT UPPER LOBE; BRONCHOSCOPY BRUSHING:  - POSITIVE FOR MALIGNANCY.  - METASTATIC CARCINOMA CONSISTENT WITH CLEAR CELL RENAL CELL CARCINOMA  ASSESSMENT/PLAN Cancer Staging Renal cell carcinoma of right kidney [facility]) Staging form: Kidney, AJCC 8th Edition - Clinical stage from 07/14/2017: Stage IV (cT3a, cNX, pM1) - Signed by Earlie Server, MD on 07/15/2017 - Pathologic: No stage assigned - Unsigned  1. Metastatic renal cell carcinoma, unspecified laterality ([place])   2. Metastasis to bone ([place])   3. Neoplasm related pain   4. Hemoptysis   5. Collapse of right lung   6. Encounter for antineoplastic immunotherapy    # s/p cycle 1. Tolerate well.  OK to proceed to cycle 2 immunotherapy # Xgeva every 28 days for metastatic bone disease, discussed with patient about side effects including jaw necrosis and he is willing to proceed. Patient currently denies any ongoing dental work or dental complaints. He was advised to follow up with dentist but has not yet. Will give first dose of Xgeva   #continue current pain regimen to  MS contin to 45 mg BID (08/11/2017 dispensed 20 tabs), 08/11/2017 refilled percocet Percocet 10/325 Q 6 hour PRN dispense 40 tabs. started Neurontin 331m TID. Discussed again about  not to take pain medication more frequently than instructed. He voices understanding. Increased MS contin helps with the pain but still not well controlled.  His pharmacy would not filled his new percocet Rx until tomorrow. Will give him one oral dose of percocet during treatment today.    # following radiation oncology for palliative RT for symptom control.  # smoke cessation discussed in details. He is only smoking 5 cigarettes daily. Encourage him to continue weaning down smoking.   No orders of the defined types were placed in this encounter.  Follow-up on in 1 week, on 08/20/2017 to access pain control.  All questions were answered. The patient knows to call the clinic with any problems, questions or concerns.Earlie Server, MD  08/13/2017 8:34 AM

## 2017-08-13 NOTE — Progress Notes (Signed)
Patient here today for follow up.   

## 2017-08-14 ENCOUNTER — Ambulatory Visit
Admission: RE | Admit: 2017-08-14 | Discharge: 2017-08-14 | Disposition: A | Discharge status: Home / Self Care | Payer: Medicaid Other | Source: Ambulatory Visit | Attending: Radiation Oncology | Admitting: Radiation Oncology

## 2017-08-14 DIAGNOSIS — Z510 Encounter for antineoplastic radiation therapy: Secondary | ICD-10-CM | POA: Diagnosis not present

## 2017-08-14 DIAGNOSIS — C649 Malignant neoplasm of unspecified kidney, except renal pelvis: Principal | ICD-10-CM

## 2017-08-15 ENCOUNTER — Ambulatory Visit
Admission: RE | Admit: 2017-08-15 | Discharge: 2017-08-15 | Disposition: A | Discharge status: Home / Self Care | Payer: Medicaid Other | Source: Ambulatory Visit | Attending: Radiation Oncology | Admitting: Radiation Oncology

## 2017-08-15 DIAGNOSIS — Z510 Encounter for antineoplastic radiation therapy: Secondary | ICD-10-CM | POA: Diagnosis not present

## 2017-08-18 ENCOUNTER — Ambulatory Visit
Admission: RE | Admit: 2017-08-18 | Discharge: 2017-08-18 | Disposition: A | Discharge status: Home / Self Care | Payer: Medicaid Other | Source: Ambulatory Visit | Attending: Radiation Oncology | Admitting: Radiation Oncology

## 2017-08-18 DIAGNOSIS — Z510 Encounter for antineoplastic radiation therapy: Secondary | ICD-10-CM | POA: Diagnosis not present

## 2017-08-19 ENCOUNTER — Ambulatory Visit
Admission: RE | Admit: 2017-08-19 | Discharge: 2017-08-19 | Disposition: A | Discharge status: Home / Self Care | Payer: Medicaid Other | Source: Ambulatory Visit | Attending: Radiation Oncology | Admitting: Radiation Oncology

## 2017-08-19 DIAGNOSIS — Z510 Encounter for antineoplastic radiation therapy: Secondary | ICD-10-CM | POA: Diagnosis not present

## 2017-08-20 ENCOUNTER — Ambulatory Visit
Admission: RE | Admit: 2017-08-20 | Discharge: 2017-08-20 | Disposition: A | Discharge status: Home / Self Care | Payer: Medicaid Other | Source: Ambulatory Visit | Attending: Radiation Oncology | Admitting: Radiation Oncology

## 2017-08-20 ENCOUNTER — Inpatient Hospital Stay: Payer: Medicaid Other

## 2017-08-20 ENCOUNTER — Inpatient Hospital Stay: Payer: Medicaid Other | Admitting: Oncology

## 2017-08-20 DIAGNOSIS — Z510 Encounter for antineoplastic radiation therapy: Secondary | ICD-10-CM | POA: Diagnosis not present

## 2017-08-21 ENCOUNTER — Ambulatory Visit
Admission: RE | Admit: 2017-08-21 | Discharge: 2017-08-21 | Disposition: A | Discharge status: Home / Self Care | Payer: Medicaid Other | Source: Ambulatory Visit | Attending: Radiation Oncology | Admitting: Radiation Oncology

## 2017-08-21 ENCOUNTER — Inpatient Hospital Stay (HOSPITAL_BASED_OUTPATIENT_CLINIC_OR_DEPARTMENT_OTHER): Payer: Medicaid Other | Admitting: Oncology

## 2017-08-21 ENCOUNTER — Inpatient Hospital Stay: Payer: Medicaid Other

## 2017-08-21 ENCOUNTER — Encounter: Payer: Self-pay | Admitting: Oncology

## 2017-08-21 VITALS — BP 128/83 | HR 81 | Temp 98.6°F | Wt 238.2 lb

## 2017-08-21 DIAGNOSIS — F17210 Nicotine dependence, cigarettes, uncomplicated: Secondary | ICD-10-CM

## 2017-08-21 DIAGNOSIS — C7951 Secondary malignant neoplasm of bone: Secondary | ICD-10-CM | POA: Diagnosis not present

## 2017-08-21 DIAGNOSIS — C7801 Secondary malignant neoplasm of right lung: Secondary | ICD-10-CM | POA: Diagnosis not present

## 2017-08-21 DIAGNOSIS — M791 Myalgia: Secondary | ICD-10-CM

## 2017-08-21 DIAGNOSIS — C641 Malignant neoplasm of right kidney, except renal pelvis: Secondary | ICD-10-CM

## 2017-08-21 DIAGNOSIS — R109 Unspecified abdominal pain: Secondary | ICD-10-CM | POA: Diagnosis not present

## 2017-08-21 DIAGNOSIS — J9811 Atelectasis: Secondary | ICD-10-CM

## 2017-08-21 DIAGNOSIS — C649 Malignant neoplasm of unspecified kidney, except renal pelvis: Secondary | ICD-10-CM

## 2017-08-21 DIAGNOSIS — R0602 Shortness of breath: Secondary | ICD-10-CM | POA: Diagnosis not present

## 2017-08-21 DIAGNOSIS — Z510 Encounter for antineoplastic radiation therapy: Secondary | ICD-10-CM | POA: Diagnosis not present

## 2017-08-21 DIAGNOSIS — Z720 Tobacco use: Secondary | ICD-10-CM

## 2017-08-21 DIAGNOSIS — G893 Neoplasm related pain (acute) (chronic): Secondary | ICD-10-CM

## 2017-08-21 DIAGNOSIS — Z5112 Encounter for antineoplastic immunotherapy: Secondary | ICD-10-CM | POA: Diagnosis not present

## 2017-08-21 DIAGNOSIS — R05 Cough: Secondary | ICD-10-CM

## 2017-08-21 DIAGNOSIS — M25511 Pain in right shoulder: Secondary | ICD-10-CM

## 2017-08-21 LAB — CBC WITH DIFFERENTIAL/PLATELET
BASOS PCT: 1 %
Basophils Absolute: 0.1 10*3/uL (ref 0–0.1)
Eosinophils Absolute: 0.1 10*3/uL (ref 0–0.7)
Eosinophils Relative: 3 %
HEMATOCRIT: 38.4 % — AB (ref 40.0–52.0)
Hemoglobin: 13.5 g/dL (ref 13.0–18.0)
Lymphocytes Relative: 21 %
Lymphs Abs: 1 10*3/uL (ref 1.0–3.6)
MCH: 30.4 pg (ref 26.0–34.0)
MCHC: 35.1 g/dL (ref 32.0–36.0)
MCV: 86.6 fL (ref 80.0–100.0)
MONO ABS: 0.6 10*3/uL (ref 0.2–1.0)
MONOS PCT: 12 %
NEUTROS ABS: 3.1 10*3/uL (ref 1.4–6.5)
Neutrophils Relative %: 63 %
Platelets: 277 10*3/uL (ref 150–440)
RBC: 4.43 MIL/uL (ref 4.40–5.90)
RDW: 13 % (ref 11.5–14.5)
WBC: 4.9 10*3/uL (ref 3.8–10.6)

## 2017-08-21 LAB — COMPREHENSIVE METABOLIC PANEL
ALT: 15 U/L — ABNORMAL LOW (ref 17–63)
ANION GAP: 6 (ref 5–15)
AST: 15 U/L (ref 15–41)
Albumin: 3.5 g/dL (ref 3.5–5.0)
Alkaline Phosphatase: 134 U/L — ABNORMAL HIGH (ref 38–126)
BILIRUBIN TOTAL: 0.2 mg/dL — AB (ref 0.3–1.2)
BUN: 14 mg/dL (ref 6–20)
CALCIUM: 8.9 mg/dL (ref 8.9–10.3)
CHLORIDE: 103 mmol/L (ref 101–111)
CO2: 27 mmol/L (ref 22–32)
Creatinine, Ser: 0.85 mg/dL (ref 0.61–1.24)
Glucose, Bld: 102 mg/dL — ABNORMAL HIGH (ref 65–99)
POTASSIUM: 4.5 mmol/L (ref 3.5–5.1)
Sodium: 136 mmol/L (ref 135–145)
Total Protein: 7.4 g/dL (ref 6.5–8.1)

## 2017-08-21 LAB — TSH: TSH: 0.878 u[IU]/mL (ref 0.350–4.500)

## 2017-08-21 MED ORDER — OXYCODONE-ACETAMINOPHEN 10-325 MG PO TABS: 1.0000 | ORAL_TABLET | Freq: Four times a day (QID) | ORAL | 0 refills | Status: DC | PRN

## 2017-08-21 MED ORDER — MORPHINE SULFATE ER 30 MG PO TBCR: 30.0000 mg | EXTENDED_RELEASE_TABLET | Freq: Two times a day (BID) | ORAL | 0 refills | Status: DC

## 2017-08-21 NOTE — Progress Notes (Signed)
[facility] follow up visit  Date of visit: 08/21/17 CHIEF COMPLAINTS/PURPOSE OF CONSULTATION: Evaluation before cycle 1 Nivolumab and Ipilimumab  HISTORY OF PRESENTING ILLNESS: 1[name] 56 y.o. male with 40 pack year smoking histroy is here for evaluation of abnormal image finding. Patient has had right shoulder pain  three months after reaching below a bar to grab a glass. Patient is not able to raise his right arm over shoulder level.  Also having left flank pain and 2 weeks ago he has had pink urine for a few days and resolved after taking a course of amoxicillin. He is otherwise healthy and does not take any medication.  Patient has 40 pack year smoking history.    2 Images:  06/26/2017 CT scan showed a concerning right kidney mass with possible metastatic disease with thoracic and abdominal lymphadenopathy, lung nodules, and diffuse osseous metastases. 07/15/2017 CT PE protocol for evaluation of shortness of breath or chest pain showed no PE, however right lung collapse and rapid progression of lung adenopathy.  08/10/2017 ED visit due to uncontrolled pain: CT hip showed lucent bone lesions in the left hip and left acetabulum consistent with known metastatic disease. No evidence of pathological fracture.  08/11/2017 Bone scan: FINDINGS:Numerous areas of abnormal bony uptake noted in the skull, bilateral scapulae, sternum, cervical and thoracic spine, posterior left tenth rib, and right iliac bone compatible with metastatic disease. Degenerative uptake in the ankles and feet. Soft tissue activity unremarkable. IMPRESSION:Extensive osseous metastatic disease as above.  3 Pathology:     07/10/2017 07/24/2017 A. Lung, right upper lobe, endobronchial biopsy DIAGNOSIS: A. LUNG, RIGHT UPPER LOBE; ENDOBRONCHIAL BIOPSY: - METASTATIC CARCINOMA CONSISTENT WITH CLEAR CELL RENAL CELL CARCINOMA    Bronchoscopy on 07/24/2017 and biopsy revealed metastatic renal cell cancer.   4  Treatment started on 1st line treatment with nivolumab and ipilimumab on 07/22/2017.    Also started on palliative RT to lung and involved bone mets     5 Pain medication:  Patient finished one month supply of percocet in about one week. So his pain medication Rx are being dispensed weekly and he gets one week supply at one time. Multiple discussion about importance of taking pain medication as instructed.  Patient says that he takes MS contin 20m BID and he has almost ran out of his percocet due to the uncontrolled hip pain. I dispense 40 tablets on 08/06/2017 and he only has 2 tablets left. He admits taking 7-8 tablets on average per day due to the stabbing pain. MS contin was increased to 485mBID. Refilled his percocet 40 tablets on 08/11/2017 ans also started him on Neurontin.   INTERVAL HISTORY Patient is accompanied with his significant other to clinic for evaluation pain control.  He is getting palliative RT. Reports SOB is better. Pain is better controlled but not fully controlled. I gave him Rx of MS contin 4517mID last visit, for 10 days supply.  He told me today that he actually takes MS contin 71m52m the morning, and MS contin 30mg50m, then 3-4 percocet as needed. Pain level is 6.      Review of Systems  Constitutional: Negative.   HENT:  Negative.   Eyes: Negative.   Respiratory: Negative.        Chest pain with deep breathing.   Cardiovascular: Negative.   Gastrointestinal: Negative.   Endocrine: Negative.   Musculoskeletal: Positive for arthralgias and flank pain.       Right  hip pain.  Skin: Negative.   Neurological: Negative.   Hematological: Negative.   Psychiatric/Behavioral: Negative.     MEDICAL HISTORY: Past Medical History:  Diagnosis Date  . Bone metastasis ([place]) 07/17/2017  . Bone metastasis ([place]) 07/17/2017  . Metastatic renal cell carcinoma ([place])     SURGICAL HISTORY: Past Surgical History:  Procedure Laterality Date  . FLEXIBLE BRONCHOSCOPY N/A  07/24/2017   Procedure: FLEXIBLE BRONCHOSCOPY;  Surgeon: [name], MD;  Location: [facility] ORS;  Service: Pulmonary;  Laterality: N/A;  . HERNIA REPAIR    . left middle amputation      with reattachment    SOCIAL HISTORY: Social History   Social History  . Marital status: Single    Spouse name: N/A  . Number of children: N/A  . Years of education: N/A   Occupational History  . Not on file.   Social History Main Topics  . Smoking status: Current Every Day Smoker    Packs/day: 0.25    Years: 38.00  . Smokeless tobacco: Never Used  . Alcohol use No  . Drug use: No  . Sexual activity: Not on file   Other Topics Concern  . Not on file   Social History Narrative  . No narrative on file    FAMILY HISTORY Family History  Problem Relation Age of Onset  . Cancer Paternal Uncle        brain    ALLERGIES:  has No Known Allergies.  MEDICATIONS:  Current Outpatient Prescriptions  Medication Sig Dispense Refill  . gabapentin (NEURONTIN) 300 MG capsule Take 1 capsule (300 mg total) by mouth 3 (three) times daily. 90 capsule 0  . morphine (MS CONTIN) 15 MG 12 hr tablet Take 1 tablet (15 mg total) by mouth every 12 (twelve) hours. 20 tablet 0  . morphine (MS CONTIN) 30 MG 12 hr tablet Take 1 tablet (30 mg total) by mouth every 12 (twelve) hours. 14 tablet 0  . oxyCODONE-acetaminophen (PERCOCET) 10-325 MG tablet Take 1 tablet by mouth every 6 (six) hours as needed for pain. 40 tablet 0   No current facility-administered medications for this visit.    There were no vitals filed for this visit. Physical Exam ECOG 1 GENERAL: No distress, well nourished.  SKIN:  No rashes or significant lesions  HEAD: Normocephalic, No masses, lesions, tenderness or abnormalities  EYES: Conjunctiva are pink, non icteric ENT: External ears normal ,lips , buccal mucosa, and tongue normal and mucous membranes are moist  LYMPH: No palpable cervical and axillary lymphadenopathy  LUNGS: Clear to  auscultation, no crackles or wheezes HEART: Regular rate & rhythm, no murmurs, no gallops, S1 normal and S2 normal  ABDOMEN: Abdomen soft, non-tender, normal bowel sounds, I did not appreciate any  masses or organomegaly  MUSCULOSKELETAL: No CVA tenderness and no tenderness on percussion of the back or rib cage. Right scapular blade palpable mass. Right hip motion limited due to pain. Full examination deferred.  EXTREMITIES: No edema, no skin discoloration or tenderness NEURO: Alert & oriented, no focal motor/sensory deficits.  LABORATORY DATA: I have personally reviewed the data as listed: CBC    Component Value Date/Time   WBC 4.9 08/21/2017 1330   RBC 4.43 08/21/2017 1330   HGB 13.5 08/21/2017 1330   HCT 38.4 (L) 08/21/2017 1330   PLT 277 08/21/2017 1330   MCV 86.6 08/21/2017 1330   MCH 30.4 08/21/2017 1330   MCHC 35.1 08/21/2017 1330   RDW 13.0 08/21/2017 1330  LYMPHSABS 1.0 08/21/2017 1330   MONOABS 0.6 08/21/2017 1330   EOSABS 0.1 08/21/2017 1330   BASOSABS 0.1 08/21/2017 1330   CMP Latest Ref Rng & Units 08/13/2017 08/10/2017 08/06/2017  Glucose 65 - 99 mg/dL 127(H) 99 92  BUN 6 - 20 mg/dL _0 Creatinine 0.61 - 1.24 mg/dL 1.07 0.96 1.05  Sodium 135 - 145 mmol/L 136 136 137  Potassium 3.5 - 5.1 mmol/L 4.3 3.9 4.5  Chloride 101 - 111 mmol/L 101 104 102  CO2 22 - 32 mmol/L _1 Calcium 8.9 - 10.3 mg/dL 9.0 8.6(L) 9.0  Total Protein 6.5 - 8.1 g/dL 7.4 - 7.1  Total Bilirubin 0.3 - 1.2 mg/dL 0.6 - 0.2(L)  Alkaline Phos 38 - 126 U/L 150(H) - 156(H)  AST 15 - 41 U/L 15 - 14(L)  ALT 17 - 63 U/L 15(L) - 15(L)    RADIOGRAPHIC STUDIES: I have personally reviewed the radiological images as listed and agree with the findings in the report CT chest abdomen pelvis 06/26/2017  IMPRESSION: 1. 7 cm right lower pole renal mass. This may reflect urothelial carcinoma given involvement of the collecting system versus renal cell carcinoma. 2. Metastatic disease with thoracic and  abdominal lymphadenopathy, lung nodules, and diffuse osseous metastases. 3. Subcentimeter lesion in the right upper lobe bronchus with diffuse abnormal right upper lobe bronchial wall soft tissue thickening.  07/15/2017 No evidence of pulmonary emboli. Attenuation of the pulmonary arterial branches is noted related to hilar adenopathy. Increase in the size of the central right hilar mass with increase in mediastinal and hilar adenopathy when compared with the recent exam. New right upper lobe collapse secondary to bronchial occlusion.Additionally some patchy infiltrative changes noted in the lower lobe likely related to the acute infiltrate. Diffuse bony metastatic disease with evidence of pathologic fracture involving the left tenth rib lesion.  Pathology 07/10/2017 Surgical Pathology  CASE: [id number]  PATIENT: Travers Bartee  Surgical Pathology Report  DIAGNOSIS:  A. LYTIC BONE LESION, RIGHT ILIAC CREST; CT-GUIDED BIOPSY:  - METASTATIC CARCINOMA, PAX-8 POSITIVE, COMPATIBLE WITH METASTATIC CLEAR  CELL RENAL CELL CARCINOMA.    Surgical Pathology 07/24/2017 CASE: [id number]  SPECIMEN SUBMITTED:  A. Lung, right upper lobe, endobronchial biopsy  DIAGNOSIS:  A. LUNG, RIGHT UPPER LOBE; ENDOBRONCHIAL BIOPSY:  - METASTATIC CARCINOMA CONSISTENT WITH CLEAR CELL RENAL CELL CARCINOMA  07/24/2017 Cytology - Non PAP  CASE: [id number]  SPECIMEN SUBMITTED:  A. Lung, right upper lobe; bronchoscopy brushing  DIAGNOSIS:  A. LUNG, RIGHT UPPER LOBE; BRONCHOSCOPY BRUSHING:  - POSITIVE FOR MALIGNANCY.  - METASTATIC CARCINOMA CONSISTENT WITH CLEAR CELL RENAL CELL CARCINOMA  ASSESSMENT/PLAN Cancer Staging Renal cell carcinoma of right kidney [facility]) Staging form: Kidney, AJCC 8th Edition - Clinical stage from 07/14/2017: Stage IV (cT3a, cNX, pM1) - Signed by Earlie Server, MD on 07/15/2017 - Pathologic: No stage assigned - Unsigned  1. Metastatic renal cell carcinoma, unspecified laterality ([place])    2. Metastasis to bone ([place])   3. Collapse of right lung   4. Tobacco abuse   5. Neoplasm related pain    # s/p cycle 2 . Tolerate well. OK to proceed to cycle 2 immunotherapy # Xgeva every 28 days for metastatic bone disease, s/p  first dose of Xgeva   #Discussed about how he takes pain medication. Refilled  MS contin to 30 mg BID (08/21/2017 dispensed 28 tabs), 08/21/2017 refilled percocet Percocet 10/325 Q 6 hour PRN dispense 56 tabs. Continue Neurontin 365m TID. He understands that he  should not take pain medication more often than instructed. He understands.   # following radiation oncology for palliative RT for symptom control.  # smoke cessation discussed in details. He is only smoking 5 cigarettes daily. Encourage him to continue weaning down smoking.   No orders of the defined types were placed in this encounter.  Follow-up on in 2 weeks, on Day 1 of cycle 3 immunotherapy. All questions were answered. The patient knows to call the clinic with any problems, questions or concerns.Earlie Server, MD  08/21/2017 1:40 PM

## 2017-08-21 NOTE — Progress Notes (Signed)
Patient here today for follow up.  Patient states he has "something going on in my shoulders that doesn't hurt but feels like something is going on and at one point a doctor told me there was something there"

## 2017-08-22 ENCOUNTER — Ambulatory Visit
Admission: RE | Admit: 2017-08-22 | Discharge: 2017-08-22 | Disposition: A | Discharge status: Home / Self Care | Payer: Medicaid Other | Source: Ambulatory Visit | Attending: Radiation Oncology | Admitting: Radiation Oncology

## 2017-08-22 DIAGNOSIS — Z510 Encounter for antineoplastic radiation therapy: Secondary | ICD-10-CM | POA: Diagnosis not present

## 2017-08-25 ENCOUNTER — Ambulatory Visit
Admission: RE | Admit: 2017-08-25 | Discharge: 2017-08-25 | Disposition: A | Discharge status: Home / Self Care | Payer: Medicaid Other | Source: Ambulatory Visit | Attending: Radiation Oncology | Admitting: Radiation Oncology

## 2017-08-25 DIAGNOSIS — Z510 Encounter for antineoplastic radiation therapy: Secondary | ICD-10-CM | POA: Diagnosis not present

## 2017-08-26 ENCOUNTER — Ambulatory Visit
Admission: RE | Admit: 2017-08-26 | Discharge: 2017-08-26 | Disposition: A | Discharge status: Home / Self Care | Payer: Medicaid Other | Source: Ambulatory Visit | Attending: Radiation Oncology | Admitting: Radiation Oncology

## 2017-08-26 ENCOUNTER — Other Ambulatory Visit: Payer: Self-pay | Admitting: *Deleted

## 2017-08-26 DIAGNOSIS — Z510 Encounter for antineoplastic radiation therapy: Secondary | ICD-10-CM | POA: Diagnosis not present

## 2017-08-27 ENCOUNTER — Ambulatory Visit
Admission: RE | Admit: 2017-08-27 | Discharge: 2017-08-27 | Disposition: A | Discharge status: Home / Self Care | Payer: Medicaid Other | Source: Ambulatory Visit | Attending: Radiation Oncology | Admitting: Radiation Oncology

## 2017-08-27 DIAGNOSIS — Z510 Encounter for antineoplastic radiation therapy: Secondary | ICD-10-CM | POA: Diagnosis not present

## 2017-08-28 ENCOUNTER — Ambulatory Visit
Admission: RE | Admit: 2017-08-28 | Discharge: 2017-08-28 | Disposition: A | Discharge status: Home / Self Care | Payer: Medicaid Other | Source: Ambulatory Visit | Attending: Radiation Oncology | Admitting: Radiation Oncology

## 2017-08-28 DIAGNOSIS — Z510 Encounter for antineoplastic radiation therapy: Secondary | ICD-10-CM | POA: Diagnosis not present

## 2017-08-29 ENCOUNTER — Ambulatory Visit
Admission: RE | Admit: 2017-08-29 | Discharge: 2017-08-29 | Disposition: A | Discharge status: Home / Self Care | Payer: Medicaid Other | Source: Ambulatory Visit | Attending: Radiation Oncology | Admitting: Radiation Oncology

## 2017-08-29 DIAGNOSIS — Z510 Encounter for antineoplastic radiation therapy: Secondary | ICD-10-CM | POA: Diagnosis not present

## 2017-09-01 ENCOUNTER — Ambulatory Visit
Admission: RE | Admit: 2017-09-01 | Discharge: 2017-09-01 | Disposition: A | Discharge status: Home / Self Care | Payer: Medicaid Other | Source: Ambulatory Visit | Attending: Radiation Oncology | Admitting: Radiation Oncology

## 2017-09-01 DIAGNOSIS — Z510 Encounter for antineoplastic radiation therapy: Secondary | ICD-10-CM | POA: Diagnosis not present

## 2017-09-02 ENCOUNTER — Ambulatory Visit
Admission: RE | Admit: 2017-09-02 | Discharge: 2017-09-02 | Disposition: A | Discharge status: Home / Self Care | Payer: Medicaid Other | Source: Ambulatory Visit | Attending: Radiation Oncology | Admitting: Radiation Oncology

## 2017-09-02 DIAGNOSIS — Z510 Encounter for antineoplastic radiation therapy: Secondary | ICD-10-CM | POA: Diagnosis not present

## 2017-09-03 ENCOUNTER — Ambulatory Visit
Admission: RE | Admit: 2017-09-03 | Discharge: 2017-09-03 | Disposition: A | Discharge status: Home / Self Care | Payer: Medicaid Other | Source: Ambulatory Visit | Attending: Radiation Oncology | Admitting: Radiation Oncology

## 2017-09-03 ENCOUNTER — Encounter: Payer: Self-pay | Admitting: Oncology

## 2017-09-03 ENCOUNTER — Inpatient Hospital Stay: Payer: Medicaid Other

## 2017-09-03 ENCOUNTER — Inpatient Hospital Stay: Payer: Medicaid Other | Attending: Oncology | Admitting: Oncology

## 2017-09-03 VITALS — BP 118/77 | HR 83 | Temp 98.0°F | Wt 235.2 lb

## 2017-09-03 DIAGNOSIS — J209 Acute bronchitis, unspecified: Secondary | ICD-10-CM | POA: Diagnosis not present

## 2017-09-03 DIAGNOSIS — R1031 Right lower quadrant pain: Secondary | ICD-10-CM

## 2017-09-03 DIAGNOSIS — G893 Neoplasm related pain (acute) (chronic): Secondary | ICD-10-CM | POA: Diagnosis not present

## 2017-09-03 DIAGNOSIS — R918 Other nonspecific abnormal finding of lung field: Secondary | ICD-10-CM | POA: Insufficient documentation

## 2017-09-03 DIAGNOSIS — C649 Malignant neoplasm of unspecified kidney, except renal pelvis: Secondary | ICD-10-CM

## 2017-09-03 DIAGNOSIS — M25551 Pain in right hip: Secondary | ICD-10-CM | POA: Diagnosis not present

## 2017-09-03 DIAGNOSIS — R5383 Other fatigue: Secondary | ICD-10-CM | POA: Insufficient documentation

## 2017-09-03 DIAGNOSIS — F17210 Nicotine dependence, cigarettes, uncomplicated: Secondary | ICD-10-CM | POA: Diagnosis not present

## 2017-09-03 DIAGNOSIS — K5900 Constipation, unspecified: Secondary | ICD-10-CM | POA: Diagnosis not present

## 2017-09-03 DIAGNOSIS — C7931 Secondary malignant neoplasm of brain: Secondary | ICD-10-CM | POA: Diagnosis not present

## 2017-09-03 DIAGNOSIS — Z5112 Encounter for antineoplastic immunotherapy: Secondary | ICD-10-CM

## 2017-09-03 DIAGNOSIS — C778 Secondary and unspecified malignant neoplasm of lymph nodes of multiple regions: Secondary | ICD-10-CM | POA: Diagnosis not present

## 2017-09-03 DIAGNOSIS — Z510 Encounter for antineoplastic radiation therapy: Secondary | ICD-10-CM | POA: Diagnosis not present

## 2017-09-03 DIAGNOSIS — R109 Unspecified abdominal pain: Secondary | ICD-10-CM | POA: Diagnosis not present

## 2017-09-03 DIAGNOSIS — C641 Malignant neoplasm of right kidney, except renal pelvis: Secondary | ICD-10-CM | POA: Diagnosis not present

## 2017-09-03 DIAGNOSIS — C7951 Secondary malignant neoplasm of bone: Secondary | ICD-10-CM

## 2017-09-03 LAB — CBC WITH DIFFERENTIAL/PLATELET
BASOS ABS: 0 10*3/uL (ref 0–0.1)
BASOS PCT: 1 %
EOS PCT: 3 %
Eosinophils Absolute: 0.1 10*3/uL (ref 0–0.7)
HCT: 37.9 % — ABNORMAL LOW (ref 40.0–52.0)
Hemoglobin: 13.2 g/dL (ref 13.0–18.0)
Lymphocytes Relative: 12 %
Lymphs Abs: 0.5 10*3/uL — ABNORMAL LOW (ref 1.0–3.6)
MCH: 30.2 pg (ref 26.0–34.0)
MCHC: 34.8 g/dL (ref 32.0–36.0)
MCV: 86.8 fL (ref 80.0–100.0)
MONO ABS: 0.6 10*3/uL (ref 0.2–1.0)
Monocytes Relative: 14 %
Neutro Abs: 2.8 10*3/uL (ref 1.4–6.5)
Neutrophils Relative %: 70 %
PLATELETS: 186 10*3/uL (ref 150–440)
RBC: 4.37 MIL/uL — ABNORMAL LOW (ref 4.40–5.90)
RDW: 13.1 % (ref 11.5–14.5)
WBC: 4 10*3/uL (ref 3.8–10.6)

## 2017-09-03 LAB — COMPREHENSIVE METABOLIC PANEL
ALT: 14 U/L — ABNORMAL LOW (ref 17–63)
AST: 15 U/L (ref 15–41)
Albumin: 3.4 g/dL — ABNORMAL LOW (ref 3.5–5.0)
Alkaline Phosphatase: 113 U/L (ref 38–126)
Anion gap: 7 (ref 5–15)
BUN: 13 mg/dL (ref 6–20)
CHLORIDE: 101 mmol/L (ref 101–111)
CO2: 27 mmol/L (ref 22–32)
Calcium: 8.7 mg/dL — ABNORMAL LOW (ref 8.9–10.3)
Creatinine, Ser: 0.68 mg/dL (ref 0.61–1.24)
GFR calc Af Amer: >60 mL/min (ref >60)
Glucose, Bld: 90 mg/dL (ref 65–99)
POTASSIUM: 4.6 mmol/L (ref 3.5–5.1)
Sodium: 135 mmol/L (ref 135–145)
Total Bilirubin: 0.4 mg/dL (ref 0.3–1.2)
Total Protein: 7.1 g/dL (ref 6.5–8.1)

## 2017-09-03 LAB — TSH: TSH: 1.249 u[IU]/mL (ref 0.350–4.500)

## 2017-09-03 MED ORDER — SODIUM CHLORIDE 0.9 % IV SOLN
340.0000 mg | Freq: Once | INTRAVENOUS | Status: AC
  Administered 2017-09-03: 340 mg via INTRAVENOUS
  Filled 2017-09-03: qty 24

## 2017-09-03 MED ORDER — SODIUM CHLORIDE 0.9 % IV SOLN
100.0000 mg | Freq: Once | INTRAVENOUS | Status: AC
  Administered 2017-09-03: 100 mg via INTRAVENOUS
  Filled 2017-09-03: qty 20

## 2017-09-03 MED ORDER — SODIUM CHLORIDE 0.9 % IV SOLN
Freq: Once | INTRAVENOUS | Status: AC
  Administered 2017-09-03: 11:00:00 via INTRAVENOUS
  Filled 2017-09-03: qty 1000

## 2017-09-03 MED ORDER — MORPHINE SULFATE ER 30 MG PO TBCR: 30.0000 mg | EXTENDED_RELEASE_TABLET | Freq: Two times a day (BID) | ORAL | 0 refills | Status: DC

## 2017-09-03 MED ORDER — OXYCODONE-ACETAMINOPHEN 10-325 MG PO TABS: 1.0000 | ORAL_TABLET | Freq: Two times a day (BID) | ORAL | 0 refills | Status: DC | PRN

## 2017-09-03 NOTE — Progress Notes (Signed)
[facility] follow up visit  Date of visit: 09/03/17 REASON FOR VISIT Follow up for Evaluation before cycle 2 Nivolumab and Ipilimumab  HISTORY OF PRESENT DISEASE/ PERTINENT ONCOLOGY HISTORY 1[name] is  56 y.o. male with 40 pack year smoking histroy is here for evaluation of abnormal image finding. Patient has had right shoulder pain  three months after reaching below a bar to grab a glass. Patient is not able to raise his right arm over shoulder level.  Also having left flank pain associated with pink urine for a few days and resolved after taking a course of amoxicillin. Patient has 40 pack year smoking history.    2 Images:  06/26/2017 CT scan showed a concerning right kidney mass with possible metastatic disease with thoracic and abdominal lymphadenopathy, lung nodules, and diffuse osseous metastases. 07/15/2017 CT PE protocol for evaluation of shortness of breath or chest pain showed no PE, however right lung collapse and rapid progression of lung adenopathy.  08/10/2017 ED visit due to uncontrolled pain: CT hip showed lucent bone lesions in the left hip and left acetabulum consistent with known metastatic disease. No evidence of pathological fracture.  08/11/2017 Bone scan: FINDINGS:Numerous areas of abnormal bony uptake noted in the skull, bilateral scapulae, sternum, cervical and thoracic spine, posterior left tenth rib, and right iliac bone compatible with metastatic disease. Degenerative uptake in the ankles and feet. Soft tissue activity unremarkable. IMPRESSION:Extensive osseous metastatic disease as above.  3 Pathology:     07/10/2017 07/24/2017 A. Lung, right upper lobe, endobronchial biopsy DIAGNOSIS: A. LUNG, RIGHT UPPER LOBE; ENDOBRONCHIAL BIOPSY: - METASTATIC CARCINOMA CONSISTENT WITH CLEAR CELL RENAL CELL CARCINOMA    Bronchoscopy on 07/24/2017 and biopsy revealed metastatic renal cell cancer.   4 Treatment started on 1st line treatment with nivolumab and  ipilimumab on 07/22/2017.    Also started on palliative RT to lung and involved bone mets     5 Pain medication:  Patient finished one month supply of percocet in about one week. So his pain medication Rx are being dispensed weekly and he gets one week supply at one time. Multiple discussion about importance of taking pain medication as instructed.   Larry Watkins 56 y.o.  male with above history who presents for evaluation prior to cycle 3 immunotherapy treatment.  Problems and complaints are listed below: Neoplasm related fatigue:he still feel fatigue, not at his baseline yet.  Neoplasm related pain scale: His pain, mainly located on his right scapula, right hip and anterior chest has improved. He still feels vague pain which he points at right lower quadrant abdomen, intermittent, 8/10, sharp.  He takes MS contin 40m BID and percocet every 6 hours PRN with symptom improvement. He tells me that he usually need 3 percocet per day.  SOB: improved a lot. No additional episode of hemoptysis.  Opioid associated constipation: denies any constipation.  Palliative RT: he is getting to the end of the treatment.   Patient was on the phone talking to someone and RN overheard that he said "I am right now at doctor's office for the RX, get the money ready".      Review of Systems  Constitutional: Negative.   HENT:  Negative.   Eyes: Negative.   Respiratory: Negative.   Cardiovascular: Negative.   Gastrointestinal: Positive for abdominal pain.       Right lower quadrant  Endocrine: Negative.   Genitourinary: Negative.    Musculoskeletal: Negative.  Right hip pain.  Skin: Negative.   Neurological: Negative.   Hematological: Negative.   Psychiatric/Behavioral: Negative.     MEDICAL HISTORY: Past Medical History:  Diagnosis Date  . Bone metastasis ([place]) 07/17/2017  . Bone metastasis ([place]) 07/17/2017  . Metastatic renal cell carcinoma ([place])     SURGICAL HISTORY: Past Surgical  History:  Procedure Laterality Date  . FLEXIBLE BRONCHOSCOPY N/A 07/24/2017   Procedure: FLEXIBLE BRONCHOSCOPY;  Surgeon: [name], MD;  Location: [facility] ORS;  Service: Pulmonary;  Laterality: N/A;  . HERNIA REPAIR    . left middle amputation      with reattachment    SOCIAL HISTORY: Social History   Social History  . Marital status: Single    Spouse name: N/A  . Number of children: N/A  . Years of education: N/A   Occupational History  . Not on file.   Social History Main Topics  . Smoking status: Current Every Day Smoker    Packs/day: 0.25    Years: 38.00  . Smokeless tobacco: Never Used  . Alcohol use No  . Drug use: No  . Sexual activity: Not on file   Other Topics Concern  . Not on file   Social History Narrative  . No narrative on file    FAMILY HISTORY Family History  Problem Relation Age of Onset  . Cancer Paternal Uncle        brain    ALLERGIES:  has No Known Allergies.  MEDICATIONS:  Current Outpatient Prescriptions  Medication Sig Dispense Refill  . gabapentin (NEURONTIN) 300 MG capsule Take 1 capsule (300 mg total) by mouth 3 (three) times daily. 90 capsule 0  . morphine (MS CONTIN) 30 MG 12 hr tablet Take 1 tablet (30 mg total) by mouth every 12 (twelve) hours. 28 tablet 0  . oxyCODONE-acetaminophen (PERCOCET) 10-325 MG tablet Take 1 tablet by mouth every 6 (six) hours as needed for pain. 56 tablet 0   No current facility-administered medications for this visit.    Vitals:   09/03/17 0953  BP: 118/77  Pulse: 83  Temp: 98 F (36.7 C)   Filed Weights   09/03/17 0953  Weight: 235 lb 3 oz (106.7 kg)   Physical Exam ECOG 0 GENERAL: No distress, well nourished.  SKIN:  No rashes or significant lesions  HEAD: Normocephalic, No masses, lesions, tenderness or abnormalities  EYES: Conjunctiva are pink, non icteric ENT: External ears normal ,lips , buccal mucosa, and tongue normal and mucous membranes are moist  LYMPH: No palpable  cervical and axillary lymphadenopathy  LUNGS: Clear to auscultation, no crackles or wheezes HEART: Regular rate & rhythm, no murmurs, no gallops, S1 normal and S2 normal  ABDOMEN: Abdomen soft, non-tender, normal bowel sounds, I did not appreciate any  masses or organomegaly  MUSCULOSKELETAL: No CVA tenderness and no tenderness on percussion of the back or rib cage. Right scapular blade palpable mass.   EXTREMITIES: No edema, no skin discoloration or tenderness NEURO: Alert & oriented, no focal motor/sensory deficits.  LABORATORY DATA: I have personally reviewed the data as listed: CBC    Component Value Date/Time   WBC 4.0 09/03/2017 0911   RBC 4.37 (L) 09/03/2017 0911   HGB 13.2 09/03/2017 0911   HCT 37.9 (L) 09/03/2017 0911   PLT 186 09/03/2017 0911   MCV 86.8 09/03/2017 0911   MCH 30.2 09/03/2017 0911   MCHC 34.8 09/03/2017 0911   RDW 13.1 09/03/2017 0911   LYMPHSABS 0.5 (L) 09/03/2017 0911  MONOABS 0.6 09/03/2017 0911   EOSABS 0.1 09/03/2017 0911   BASOSABS 0.0 09/03/2017 0911   CMP Latest Ref Rng & Units 09/03/2017 08/21/2017 08/13/2017  Glucose 65 - 99 mg/dL 90 102(H) 127(H)  BUN 6 - 20 mg/dL _0 Creatinine 0.61 - 1.24 mg/dL 0.68 0.85 1.07  Sodium 135 - 145 mmol/L 135 136 136  Potassium 3.5 - 5.1 mmol/L 4.6 4.5 4.3  Chloride 101 - 111 mmol/L 101 103 101  CO2 22 - 32 mmol/L _1 Calcium 8.9 - 10.3 mg/dL 8.7(L) 8.9 9.0  Total Protein 6.5 - 8.1 g/dL 7.1 7.4 7.4  Total Bilirubin 0.3 - 1.2 mg/dL 0.4 0.2(L) 0.6  Alkaline Phos 38 - 126 U/L 113 134(H) 150(H)  AST 15 - 41 U/L _2 ALT 17 - 63 U/L 14(L) 15(L) 15(L)    RADIOGRAPHIC STUDIES: I have personally reviewed the radiological images as listed and agree with the findings in the report CT chest abdomen pelvis 06/26/2017  IMPRESSION: 1. 7 cm right lower pole renal mass. This may reflect urothelial carcinoma given involvement of the collecting system versus renal cell carcinoma. 2. Metastatic disease  with thoracic and abdominal lymphadenopathy, lung nodules, and diffuse osseous metastases. 3. Subcentimeter lesion in the right upper lobe bronchus with diffuse abnormal right upper lobe bronchial wall soft tissue thickening.  07/15/2017 No evidence of pulmonary emboli. Attenuation of the pulmonary arterial branches is noted related to hilar adenopathy. Increase in the size of the central right hilar mass with increase in mediastinal and hilar adenopathy when compared with the recent exam. New right upper lobe collapse secondary to bronchial occlusion.Additionally some patchy infiltrative changes noted in the lower lobe likely related to the acute infiltrate. Diffuse bony metastatic disease with evidence of pathologic fracture involving the left tenth rib lesion.  Pathology 07/10/2017 Surgical Pathology  CASE: [id number]  PATIENT: Deloris Tumbleson  Surgical Pathology Report  DIAGNOSIS:  A. LYTIC BONE LESION, RIGHT ILIAC CREST; CT-GUIDED BIOPSY:  - METASTATIC CARCINOMA, PAX-8 POSITIVE, COMPATIBLE WITH METASTATIC CLEAR  CELL RENAL CELL CARCINOMA.    Surgical Pathology 07/24/2017 CASE: [id number]  SPECIMEN SUBMITTED:  A. Lung, right upper lobe, endobronchial biopsy  DIAGNOSIS:  A. LUNG, RIGHT UPPER LOBE; ENDOBRONCHIAL BIOPSY:  - METASTATIC CARCINOMA CONSISTENT WITH CLEAR CELL RENAL CELL CARCINOMA  07/24/2017 Cytology - Non PAP  CASE: [id number]  SPECIMEN SUBMITTED:  A. Lung, right upper lobe; bronchoscopy brushing  DIAGNOSIS:  A. LUNG, RIGHT UPPER LOBE; BRONCHOSCOPY BRUSHING:  - POSITIVE FOR MALIGNANCY.  - METASTATIC CARCINOMA CONSISTENT WITH CLEAR CELL RENAL CELL CARCINOMA  ASSESSMENT/PLAN Cancer Staging Renal cell carcinoma of right kidney [facility]) Staging form: Kidney, AJCC 8th Edition - Clinical stage from 07/14/2017: Stage IV (cT3a, cNX, pM1) - Signed by Earlie Server, MD on 07/15/2017 - Pathologic: No stage assigned - Unsigned  1. Metastatic renal cell carcinoma, unspecified  laterality ([place])   2. Encounter for antineoplastic immunotherapy   3. Metastasis to bone ([place])   4. Neoplasm related pain    1/2, OK to proceed to cycle 3 immunotherapy. Plan repeat images after 4 cycles.  3 Xgeva every 28 days for metastatic bone disease, s/p  first dose of Xgeva 08/13/2017   4 Discussed about how he takes pain medication. He will finish his RT today and clinically his pain appears to be better. There is no palpable tenderness. Right lower quadrant pain is vague. I refilled  MS contin to 30 mg BID (09/03/2017 dispensed 28 tabs), decrease percocet  Percocet 10/325 to Q12 hour PRN dispense 28 tabs. Continue Neurontin 357m TID. He understands that he should not take pain medication more often than instructed. He understands.    Follow-up on in 3 weeks, on Day 1 of cycle 4  Immunotherapy.  All questions were answered. The patient knows to call the clinic with any problems, questions or concerns..Earlie Server MD  09/03/2017 10:11 AM

## 2017-09-03 NOTE — Progress Notes (Signed)
Patient here today for follow up.  Patient states no new concerns today  

## 2017-09-08 ENCOUNTER — Telehealth: Payer: Self-pay | Admitting: Oncology

## 2017-09-08 ENCOUNTER — Emergency Department
Admission: EM | Admit: 2017-09-08 | Discharge: 2017-09-08 | Disposition: A | Discharge status: Home / Self Care | Payer: Medicaid Other | Attending: Emergency Medicine | Admitting: Emergency Medicine

## 2017-09-08 ENCOUNTER — Emergency Department: Payer: Medicaid Other

## 2017-09-08 DIAGNOSIS — Z79899 Other long term (current) drug therapy: Secondary | ICD-10-CM | POA: Diagnosis not present

## 2017-09-08 DIAGNOSIS — C7951 Secondary malignant neoplasm of bone: Secondary | ICD-10-CM | POA: Diagnosis not present

## 2017-09-08 DIAGNOSIS — J189 Pneumonia, unspecified organism: Secondary | ICD-10-CM

## 2017-09-08 DIAGNOSIS — J181 Lobar pneumonia, unspecified organism: Secondary | ICD-10-CM | POA: Diagnosis not present

## 2017-09-08 DIAGNOSIS — C649 Malignant neoplasm of unspecified kidney, except renal pelvis: Secondary | ICD-10-CM | POA: Diagnosis not present

## 2017-09-08 DIAGNOSIS — F17210 Nicotine dependence, cigarettes, uncomplicated: Secondary | ICD-10-CM | POA: Diagnosis not present

## 2017-09-08 DIAGNOSIS — R509 Fever, unspecified: Secondary | ICD-10-CM | POA: Diagnosis present

## 2017-09-08 LAB — CBC WITH DIFFERENTIAL/PLATELET
Basophils Absolute: 0 10*3/uL (ref 0–0.1)
Basophils Relative: 1 %
EOS PCT: 4 %
Eosinophils Absolute: 0.2 10*3/uL (ref 0–0.7)
HCT: 40.2 % (ref 40.0–52.0)
HEMOGLOBIN: 13.7 g/dL (ref 13.0–18.0)
LYMPHS ABS: 0.5 10*3/uL — AB (ref 1.0–3.6)
LYMPHS PCT: 11 %
MCH: 29.4 pg (ref 26.0–34.0)
MCHC: 34 g/dL (ref 32.0–36.0)
MCV: 86.3 fL (ref 80.0–100.0)
MONOS PCT: 16 %
Monocytes Absolute: 0.7 10*3/uL (ref 0.2–1.0)
NEUTROS PCT: 68 %
Neutro Abs: 2.9 10*3/uL (ref 1.4–6.5)
Platelets: 184 10*3/uL (ref 150–440)
RBC: 4.66 MIL/uL (ref 4.40–5.90)
RDW: 13.3 % (ref 11.5–14.5)
WBC: 4.3 10*3/uL (ref 3.8–10.6)

## 2017-09-08 LAB — COMPREHENSIVE METABOLIC PANEL
ALBUMIN: 3.4 g/dL — AB (ref 3.5–5.0)
ALK PHOS: 108 U/L (ref 38–126)
ALT: 12 U/L — AB (ref 17–63)
AST: 16 U/L (ref 15–41)
Anion gap: 8 (ref 5–15)
BUN: 12 mg/dL (ref 6–20)
CALCIUM: 9.2 mg/dL (ref 8.9–10.3)
CHLORIDE: 102 mmol/L (ref 101–111)
CO2: 27 mmol/L (ref 22–32)
CREATININE: 0.8 mg/dL (ref 0.61–1.24)
GFR calc non Af Amer: >60 mL/min (ref >60)
GLUCOSE: 118 mg/dL — AB (ref 65–99)
Potassium: 4.1 mmol/L (ref 3.5–5.1)
SODIUM: 137 mmol/L (ref 135–145)
Total Bilirubin: 0.3 mg/dL (ref 0.3–1.2)
Total Protein: 7.3 g/dL (ref 6.5–8.1)

## 2017-09-08 LAB — PROCALCITONIN: Procalcitonin: <0.1 ng/mL

## 2017-09-08 LAB — TROPONIN I: Troponin I: <0.03 ng/mL (ref <0.03)

## 2017-09-08 LAB — LACTIC ACID, PLASMA: LACTIC ACID, VENOUS: 0.9 mmol/L (ref 0.5–1.9)

## 2017-09-08 MED ORDER — ONDANSETRON 4 MG PO TBDP: 4.0000 mg | ORAL_TABLET | Freq: Three times a day (TID) | ORAL | 0 refills | Status: DC | PRN

## 2017-09-08 MED ORDER — AZITHROMYCIN 250 MG PO TABS: ORAL_TABLET | ORAL | 0 refills | Status: AC

## 2017-09-08 MED ORDER — AMOXICILLIN 500 MG PO TABS: 500.0000 mg | ORAL_TABLET | Freq: Two times a day (BID) | ORAL | 0 refills | Status: AC

## 2017-09-08 MED ORDER — ONDANSETRON 4 MG PO TBDP
4.0000 mg | ORAL_TABLET | Freq: Once | ORAL | Status: AC
  Administered 2017-09-08: 4 mg via ORAL

## 2017-09-08 MED ORDER — ONDANSETRON 4 MG PO TBDP
ORAL_TABLET | ORAL | Status: AC
  Filled 2017-09-08: qty 1

## 2017-09-08 NOTE — ED Provider Notes (Signed)
[facility] Emergency Department Provider Note  ____________________________________________   First MD Initiated Contact with Patient 09/08/17 2305     (approximate)  I have reviewed the triage vital signs and the nursing notes.   HISTORY  Chief Complaint Fever   HPI Larry Watkins is a 56 y.o. male who self presents to the emergency department with 2 hours of subjective fever and chills. He has had somewhat of a productive cough for the past day or so and active of white sputum. He is also had brief episodes of cramping abdominal discomfort moderate diffuse abdominal and he has vomited once. He denies diarrhea. He has stage IV kidney cancer last had chemotherapy 6 days ago. He came to the emergency department today even though he feels well because he was told with his history of cancer he has to come in any time he feels warm.   Past Medical History:  Diagnosis Date  . Bone metastasis ([place]) 07/17/2017  . Bone metastasis ([place]) 07/17/2017  . Metastatic renal cell carcinoma [facility])     Patient Active Problem List   Diagnosis Date Noted  . Goals of care, counseling/discussion 08/01/2017  . Metastatic renal cell carcinoma ([place]) 07/17/2017  . Bone metastasis ([place]) 07/17/2017  . Collapse of right lung 07/15/2017  . Renal cell carcinoma of right kidney ([place]) 07/14/2017    Past Surgical History:  Procedure Laterality Date  . FLEXIBLE BRONCHOSCOPY N/A 07/24/2017   Procedure: FLEXIBLE BRONCHOSCOPY;  Surgeon: [name], MD;  Location: [facility] ORS;  Service: Pulmonary;  Laterality: N/A;  . HERNIA REPAIR    . left middle amputation      with reattachment    Prior to Admission medications   Medication Sig Start Date End Date Taking? Authorizing Provider  gabapentin (NEURONTIN) 300 MG capsule Take 1 capsule (300 mg total) by mouth 3 (three) times daily. 08/11/17  Yes Earlie Server, MD  morphine (MS CONTIN) 30 MG 12 hr tablet Take 1 tablet (30 mg total) by mouth  every 12 (twelve) hours. 09/03/17  Yes Earlie Server, MD  oxyCODONE-acetaminophen (PERCOCET) 10-325 MG tablet Take 1 tablet by mouth every 12 (twelve) hours as needed for pain. 09/03/17  Yes Earlie Server, MD  amoxicillin (AMOXIL) 500 MG tablet Take 1 tablet (500 mg total) by mouth 2 (two) times daily. 09/08/17 09/15/17  Darel Hong, MD  azithromycin (ZITHROMAX Z-PAK) 250 MG tablet Take 2 tablets (500 mg) on  Day 1,  followed by 1 tablet (250 mg) once daily on Days 2 through 5. 09/08/17 09/13/17  Darel Hong, MD    Allergies Patient has no known allergies.  Family History  Problem Relation Age of Onset  . Cancer Paternal Uncle        brain    Social History Social History  Substance Use Topics  . Smoking status: Current Every Day Smoker    Packs/day: 0.25    Years: 38.00  . Smokeless tobacco: Never Used  . Alcohol use No    Review of Systems Constitutional: positive for fevers and chills Eyes: No visual changes. ENT: No sore throat. Cardiovascular: positive for chest pain. Respiratory: Denies shortness of breath. Gastrointestinal: positive for abdominal pain.  Positive for nausea, positive for vomiting.  No diarrhea.  No constipation. Genitourinary: Negative for dysuria. Musculoskeletal: Negative for back pain. Skin: Negative for rash. Neurological: Negative for headaches, focal weakness or numbness.   ____________________________________________   PHYSICAL EXAM:  VITAL SIGNS: ED Triage Vitals  Enc Vitals Group  BP 09/08/17 2052 119/68     Pulse Rate 09/08/17 2052 (!) 104     Resp 09/08/17 2052 20     Temp 09/08/17 2052 99 F (37.2 C)     Temp Source 09/08/17 2052 Oral     SpO2 09/08/17 2052 96 %     Weight 09/08/17 2053 238 lb (108 kg)     Height 09/08/17 2053 _0  (1.702 m)     Head Circumference --      Peak Flow --      Pain Score 09/08/17 2052 10     Pain Loc --      Pain Edu? --      Excl. in [place]? --     Constitutional: alert and oriented 4 very  well-appearing nontoxic no diaphoresis speaks in full clear sentences Eyes: PERRL EOMI. Head: Atraumatic. Nose: No congestion/rhinnorhea. Mouth/Throat: No trismus Neck: No stridor.   Cardiovascular: Normal rate, regular rhythm. Grossly normal heart sounds.  Good peripheral circulation. Respiratory: Normal respiratory effort.  No retractions. rhonchi left upper lobe remainder of lungs are clear and moving good air Gastrointestinal: soft nontender Musculoskeletal: No lower extremity edema   Neurologic:  Normal speech and language. No gross focal neurologic deficits are appreciated. Skin:  Skin is warm, dry and intact. No rash noted. Psychiatric: Mood and affect are normal. Speech and behavior are normal.    ____________________________________________   DIFFERENTIAL includes but not limited to  neutropenic fever, bacteremia, urinary tract infection, pneumonia ____________________________________________   LABS (all labs ordered are listed, but only abnormal results are displayed)  Labs Reviewed  COMPREHENSIVE METABOLIC PANEL - Abnormal; Notable for the following:       Result Value   Glucose, Bld 118 (*)    Albumin 3.4 (*)    ALT 12 (*)    All other components within normal limits  CBC WITH DIFFERENTIAL/PLATELET - Abnormal; Notable for the following:    Lymphs Abs 0.5 (*)    All other components within normal limits  CULTURE, BLOOD (ROUTINE X 2)  CULTURE, BLOOD (ROUTINE X 2)  URINE CULTURE  LACTIC ACID, PLASMA  TROPONIN I  PROCALCITONIN  LACTIC ACID, PLASMA  URINALYSIS, COMPLETE (UACMP) WITH MICROSCOPIC     __________________________________________  EKG   ____________________________________________  RADIOLOGY  Chest x-ray reviewed by me shows no acute disease and improving metastatic disease ____________________________________________   PROCEDURES  Procedure(s) performed: no  Procedures  Critical Care performed: no  Observation:  no ____________________________________________   INITIAL IMPRESSION / ASSESSMENT AND PLAN / ED COURSE  Pertinent labs & imaging results that were available during my care of the patient were reviewed by me and considered in my medical decision making (see chart for details).  The patient is very well-appearing with normal vital signs and is afebrile. He does report some increasing productive cough and was chest x-ray is normal he does have rhonchi isolated in his left upper lobe which is concerning for possible pneumonia. Blood cultures drawn and I will cover him with azithromycin and amoxicillin. He does not want to stay in the hospital any longer.  He has follow-up in one week. Strict return precautions were given and the patient verbally understands and agrees with the plan.      ____________________________________________   FINAL CLINICAL IMPRESSION(S) / ED DIAGNOSES  Final diagnoses:  Community acquired pneumonia of left upper lobe of lung ([place])      NEW MEDICATIONS STARTED DURING THIS VISIT:  New Prescriptions   AMOXICILLIN (AMOXIL) 500  MG TABLET    Take 1 tablet (500 mg total) by mouth 2 (two) times daily.   AZITHROMYCIN (ZITHROMAX Z-PAK) 250 MG TABLET    Take 2 tablets (500 mg) on  Day 1,  followed by 1 tablet (250 mg) once daily on Days 2 through 5.     Note:  This document was prepared using Dragon voice recognition software and may include unintentional dictation errors.     Darel Hong, MD 09/08/17 2322

## 2017-09-08 NOTE — ED Notes (Signed)
This RN to pt room. Pt states he wants to leave because he is tired of waiting. Asked pt if he was sure he wanted to go home and not get seen by the MD. Pt stated yes. I asked pt if he would allow me 20 seconds to speak with the md. Pt agreed. [place] notified and he is going in to see pt.

## 2017-09-08 NOTE — ED Notes (Signed)
ED Provider at bedside. 

## 2017-09-08 NOTE — ED Notes (Signed)
Family at bedside. 

## 2017-09-08 NOTE — Discharge Instructions (Signed)
Please take both of your antibiotics as prescribed and return to the emergency department for any concerns such as worsening fever, if you cannot breathe, for worsening shortness of breath, or for any other concerns whatsoever. Please follow-up with your Oncologist and your PMD as scheduled.  It was a pleasure to take care of you today, and thank you for coming to our emergency department.  If you have any questions or concerns before leaving please ask the nurse to grab me and I'm more than happy to go through your aftercare instructions again.  If you were prescribed any opioid pain medication today such as Norco, Vicodin, Percocet, morphine, hydrocodone, or oxycodone please make sure you do not drive when you are taking this medication as it can alter your ability to drive safely.  If you have any concerns once you are home that you are not improving or are in fact getting worse before you can make it to your follow-up appointment, please do not hesitate to call 911 and come back for further evaluation.  Darel Hong, MD  Results for orders placed or performed during the hospital encounter of 09/08/17  Lactic acid, plasma  Result Value Ref Range   Lactic Acid, Venous 0.9 0.5 - 1.9 mmol/L  Comprehensive metabolic panel  Result Value Ref Range   Sodium 137 135 - 145 mmol/L   Potassium 4.1 3.5 - 5.1 mmol/L   Chloride 102 101 - 111 mmol/L   CO2 27 22 - 32 mmol/L   Glucose, Bld 118 (H) 65 - 99 mg/dL   BUN 12 6 - 20 mg/dL   Creatinine, Ser 0.80 0.61 - 1.24 mg/dL   Calcium 9.2 8.9 - 10.3 mg/dL   Total Protein 7.3 6.5 - 8.1 g/dL   Albumin 3.4 (L) 3.5 - 5.0 g/dL   AST 16 15 - 41 U/L   ALT 12 (L) 17 - 63 U/L   Alkaline Phosphatase 108 38 - 126 U/L   Total Bilirubin 0.3 0.3 - 1.2 mg/dL   GFR calc non Af Amer >60 >60 mL/min   GFR calc Af Amer >60 >60 mL/min   Anion gap 8 5 - 15  Troponin I  Result Value Ref Range   Troponin I <0.03 <0.03 ng/mL  CBC WITH DIFFERENTIAL  Result Value Ref  Range   WBC 4.3 3.8 - 10.6 K/uL   RBC 4.66 4.40 - 5.90 MIL/uL   Hemoglobin 13.7 13.0 - 18.0 g/dL   HCT 40.2 40.0 - 52.0 %   MCV 86.3 80.0 - 100.0 fL   MCH 29.4 26.0 - 34.0 pg   MCHC 34.0 32.0 - 36.0 g/dL   RDW 13.3 11.5 - 14.5 %   Platelets 184 150 - 440 K/uL   Neutrophils Relative % 68 %   Neutro Abs 2.9 1.4 - 6.5 K/uL   Lymphocytes Relative 11 %   Lymphs Abs 0.5 (L) 1.0 - 3.6 K/uL   Monocytes Relative 16 %   Monocytes Absolute 0.7 0.2 - 1.0 K/uL   Eosinophils Relative 4 %   Eosinophils Absolute 0.2 0 - 0.7 K/uL   Basophils Relative 1 %   Basophils Absolute 0.0 0 - 0.1 K/uL  Procalcitonin  Result Value Ref Range   Procalcitonin <0.10 ng/mL   Nm Bone Scan Whole Body  Result Date: 08/11/2017 CLINICAL DATA:  Renal cell cancer. EXAM: NUCLEAR MEDICINE WHOLE BODY BONE SCAN TECHNIQUE: Whole body anterior and posterior images were obtained approximately 3 hours after intravenous injection of radiopharmaceutical. RADIOPHARMACEUTICALS:  22.6 mCi Technetium-2m MDP IV COMPARISON:  CT chest, abdomen and pelvis of 06/26/2017 FINDINGS: Numerous areas of abnormal bony uptake noted in the skull, bilateral scapulae, sternum, cervical and thoracic spine, posterior left tenth rib, and right iliac bone compatible with metastatic disease. Degenerative uptake in the ankles and feet. Soft tissue activity unremarkable. IMPRESSION: Extensive osseous metastatic disease as above. Electronically Signed   By: Rolm Baptise M.D.   On: 08/11/2017 15:26   Ct Hip Left Wo Contrast  Result Date: 08/10/2017 CLINICAL DATA:  Renal cancer with bone metastasis. New onset severe pain to the left hip. EXAM: CT OF THE LEFT HIP WITHOUT CONTRAST TECHNIQUE: Multidetector CT imaging of the left hip was performed according to the standard protocol. Multiplanar CT image reconstructions were also generated. COMPARISON:  Left hip radiographs 08/10/2017 FINDINGS: Bones/Joint/Cartilage Poorly defined lucent lesions demonstrated in the  greater trochanteric and intratrochanteric region of the left hip as well as in the left inferior pubic ramus and left acetabulum. These are consistent with the patient's known history of metastatic renal cell carcinoma. No pathologic fracture or dislocation is identified. Ligaments Suboptimally assessed by CT. Muscles and Tendons No intramuscular mass or hematoma identified. Soft tissues Visualized pelvic organs appear intact. Lymph nodes in the left groin and left pelvic regions are not pathologically enlarged. IMPRESSION: Lucent bone lesions in the left hip and left acetabulum consistent with known metastatic disease. No evidence of pathologic fracture. Electronically Signed   By: Lucienne Capers M.D.   On: 08/10/2017 05:23   Dg Chest Port 1 View  Result Date: 09/08/2017 CLINICAL DATA:  Chills and fever.  Nausea. EXAM: PORTABLE CHEST 1 VIEW COMPARISON:  Bone scan 08/11/2017, CT 07/15/2017 FINDINGS: Mildly prominent hilar and mediastinal contours on the right, significantly improved from 07/15/2017. The lungs are clear. The pulmonary vasculature is normal. No pleural effusion. Skeletal metastatic lesions are visible at the right scapular body and left coracoid-glenoid. IMPRESSION: 1. No acute cardiopulmonary findings. Reduced size of the right hilar mass with improved right lung aeration. 2. Known skeletal metastatic disease. Electronically Signed   By: Andreas Newport M.D.   On: 09/08/2017 22:08   Dg Hip Unilat With Pelvis 2-3 Views Left  Result Date: 08/10/2017 CLINICAL DATA:  Left hip pain. History of cancer with bone metastasis. EXAM: DG HIP (WITH OR WITHOUT PELVIS) 2-3V LEFT COMPARISON:  CT abdomen pelvis 06/26/2017 FINDINGS: Lytic lesion involving the proximal femur shaft/low intertrochanteric region was better characterized on CT. There is mild thinning of the lateral cortex. No evidence of pathologic fracture. The left inferior pubic ramus fracture on CT is not well demonstrated radiographically.  Femoral head is seated in the acetabulum. Flank lesion involving the right iliac bone as seen on prior CT. IMPRESSION: Lytic lesion in the proximal left femoral shaft/low intertrochanteric region, as seen on prior CT. Mild thinning of the lateral femoral cortex. No evidence pathologic fracture. Left inferior pubic ramus fracture on CT not well seen radiographically. Lytic lesion of the right iliac bone again seen. Electronically Signed   By: Jeb Levering M.D.   On: 08/10/2017 03:41

## 2017-09-08 NOTE — ED Triage Notes (Signed)
Pt reports stage 4 kidney cancer with mets, pt states that approx an hour ago he started with chills, vomiting, nausea and fever, pt states that he has been instructed to come to the Er if he ever has these symptoms, pt reports bone pai in his rt hip and leg

## 2017-09-08 NOTE — Telephone Encounter (Signed)
Patient's significant other Marlowe Kays called answering service and left a message saying that patient had chills, nausea/vomiting and now is in ED, has not been seen yet.  Called Marlowe Kays twice and went to voice mail. Left message. Called ER and talked Triage RN lisa. Patient is in Triage and is currently getting in the room waiting for evaluation.

## 2017-09-08 NOTE — ED Notes (Signed)
Pt to the er for n/v. Pt states he had felt ok today but after dinner he coughed and had an episode of severe vomiting. Pt states he has a fever, and is still nauseated. Pt is currently being tx for cancer. Pain is in the right hip and leg that is chronic. Pt has not taken his pain meds for tonight as he was nauseated.

## 2017-09-13 LAB — CULTURE, BLOOD (ROUTINE X 2)
Culture: NO GROWTH
Culture: NO GROWTH
SPECIAL REQUESTS: ADEQUATE
SPECIAL REQUESTS: ADEQUATE

## 2017-09-15 ENCOUNTER — Ambulatory Visit
Admission: RE | Admit: 2017-09-15 | Discharge: 2017-09-15 | Disposition: A | Discharge status: Home / Self Care | Payer: Medicaid Other | Source: Ambulatory Visit | Attending: Radiation Oncology | Admitting: Radiation Oncology

## 2017-09-15 VITALS — BP 116/77 | HR 98 | Temp 100.3°F | Resp 20 | Wt 233.1 lb

## 2017-09-15 DIAGNOSIS — Z510 Encounter for antineoplastic radiation therapy: Secondary | ICD-10-CM | POA: Diagnosis not present

## 2017-09-15 DIAGNOSIS — C7951 Secondary malignant neoplasm of bone: Secondary | ICD-10-CM

## 2017-09-15 NOTE — Progress Notes (Signed)
Radiation Oncology Follow up Note  Name: Larry Watkins   Date:   09/15/2017 MRN:  [id number] DOB: 1961-03-11    This 56 y.o. 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  REFERRING PROVIDER: Earlie Server, MD  HPI: Patient is a 56 year old male with stage IV renal cell carcinoma with both lung bone and lung metastasis. He is completed palliative radiation therapy to his right hip as well as a course of palliative radiation therapy. To his right lung. He states the pain is not much improved in his right hip. He's specifically denies cough hemoptysis or chest tightness.  COMPLICATIONS OF TREATMENT: none  FOLLOW UP COMPLIANCE: keeps appointments   PHYSICAL EXAM:  BP 116/77   Pulse 98   Temp 100.3 F (37.9 C)   Resp 20   Wt 233 lb 2.2 oz (105.7 kg)   BMI 36.51 kg/m  Wheelchair-bound male in NAD. Range of motion of his right hip does not elicit pain lungs are clear to A&P. Well-developed well-nourished patient in NAD. HEENT reveals PERLA, EOMI, discs not visualized.  Oral cavity is clear. No oral mucosal lesions are identified. Neck is clear without evidence of cervical or supraclavicular adenopathy. Lungs are clear to A&P. Cardiac examination is essentially unremarkable with regular rate and rhythm without murmur rub or thrill. Abdomen is benign with no organomegaly or masses noted. Motor sensory and DTR levels are equal and symmetric in the upper and lower extremities. Cranial nerves II through XII are grossly intact. Proprioception is intact. No peripheral adenopathy or edema is identified. No motor or sensory levels are noted. Crude visual fields are within normal range.  RADIOLOGY RESULTS: Bone scan is reviewed again showing no new areas that I would be inclined to palliate.  PLAN: At this time I'm to turn follow-up care over to medical oncology. I see no  other sites this point necessitating palliative radiation therapy. I would be happy to review review the patient again for palliative treatment should symptoms worsen. Patient and family know to call with any concerns. He is currently receiving immunotherapy and tolerating that well.  I would like to take this opportunity to thank you for allowing me to participate in the care of your patient.Armstead Peaks., MD

## 2017-09-17 ENCOUNTER — Other Ambulatory Visit: Payer: Self-pay | Admitting: Oncology

## 2017-09-17 DIAGNOSIS — C649 Malignant neoplasm of unspecified kidney, except renal pelvis: Secondary | ICD-10-CM

## 2017-09-17 MED ORDER — OXYCODONE-ACETAMINOPHEN 10-325 MG PO TABS: 1.0000 | ORAL_TABLET | Freq: Two times a day (BID) | ORAL | 0 refills | Status: DC | PRN

## 2017-09-17 MED ORDER — MORPHINE SULFATE ER 30 MG PO TBCR: 30.0000 mg | EXTENDED_RELEASE_TABLET | Freq: Two times a day (BID) | ORAL | 0 refills | Status: DC

## 2017-09-17 MED ORDER — GABAPENTIN 300 MG PO CAPS: 300.0000 mg | ORAL_CAPSULE | Freq: Three times a day (TID) | ORAL | 0 refills | Status: DC

## 2017-09-17 NOTE — Progress Notes (Signed)
09/17/2017 Refilled MS contin 30mg  BID dispense 28 tablets.  Refilled Percocet 10/325 Q12h PRN dispense 28 tablets. Refilled Gabapentin 300mg  TID dispense 90 tablets.

## 2017-09-24 ENCOUNTER — Inpatient Hospital Stay: Payer: Medicaid Other

## 2017-09-24 ENCOUNTER — Telehealth: Payer: Self-pay | Admitting: *Deleted

## 2017-09-24 ENCOUNTER — Inpatient Hospital Stay (HOSPITAL_BASED_OUTPATIENT_CLINIC_OR_DEPARTMENT_OTHER): Payer: Medicaid Other | Admitting: Internal Medicine

## 2017-09-24 VITALS — BP 119/78 | HR 87 | Temp 97.0°F | Resp 14 | Wt 228.8 lb

## 2017-09-24 DIAGNOSIS — C649 Malignant neoplasm of unspecified kidney, except renal pelvis: Secondary | ICD-10-CM

## 2017-09-24 DIAGNOSIS — R5383 Other fatigue: Secondary | ICD-10-CM | POA: Diagnosis not present

## 2017-09-24 DIAGNOSIS — C778 Secondary and unspecified malignant neoplasm of lymph nodes of multiple regions: Secondary | ICD-10-CM | POA: Diagnosis not present

## 2017-09-24 DIAGNOSIS — C641 Malignant neoplasm of right kidney, except renal pelvis: Secondary | ICD-10-CM

## 2017-09-24 DIAGNOSIS — Z5112 Encounter for antineoplastic immunotherapy: Secondary | ICD-10-CM | POA: Diagnosis not present

## 2017-09-24 DIAGNOSIS — R1031 Right lower quadrant pain: Secondary | ICD-10-CM

## 2017-09-24 DIAGNOSIS — M25551 Pain in right hip: Secondary | ICD-10-CM

## 2017-09-24 DIAGNOSIS — G893 Neoplasm related pain (acute) (chronic): Secondary | ICD-10-CM

## 2017-09-24 DIAGNOSIS — R109 Unspecified abdominal pain: Secondary | ICD-10-CM

## 2017-09-24 DIAGNOSIS — R2681 Unsteadiness on feet: Secondary | ICD-10-CM

## 2017-09-24 DIAGNOSIS — C7931 Secondary malignant neoplasm of brain: Secondary | ICD-10-CM | POA: Diagnosis not present

## 2017-09-24 DIAGNOSIS — R918 Other nonspecific abnormal finding of lung field: Secondary | ICD-10-CM

## 2017-09-24 DIAGNOSIS — J209 Acute bronchitis, unspecified: Secondary | ICD-10-CM | POA: Diagnosis not present

## 2017-09-24 DIAGNOSIS — F17210 Nicotine dependence, cigarettes, uncomplicated: Secondary | ICD-10-CM

## 2017-09-24 DIAGNOSIS — K5900 Constipation, unspecified: Secondary | ICD-10-CM

## 2017-09-24 LAB — CBC WITH DIFFERENTIAL/PLATELET
BASOS ABS: 0 10*3/uL (ref 0–0.1)
BASOS PCT: 1 %
EOS ABS: 0.1 10*3/uL (ref 0–0.7)
Eosinophils Relative: 4 %
HCT: 38 % — ABNORMAL LOW (ref 40.0–52.0)
HEMOGLOBIN: 13.1 g/dL (ref 13.0–18.0)
Lymphocytes Relative: 17 %
Lymphs Abs: 0.7 10*3/uL — ABNORMAL LOW (ref 1.0–3.6)
MCH: 30.2 pg (ref 26.0–34.0)
MCHC: 34.4 g/dL (ref 32.0–36.0)
MCV: 87.6 fL (ref 80.0–100.0)
MONOS PCT: 12 %
Monocytes Absolute: 0.5 10*3/uL (ref 0.2–1.0)
NEUTROS ABS: 2.6 10*3/uL (ref 1.4–6.5)
NEUTROS PCT: 66 %
Platelets: 263 10*3/uL (ref 150–440)
RBC: 4.33 MIL/uL — AB (ref 4.40–5.90)
RDW: 14.1 % (ref 11.5–14.5)
WBC: 3.9 10*3/uL (ref 3.8–10.6)

## 2017-09-24 LAB — COMPREHENSIVE METABOLIC PANEL
ALK PHOS: 97 U/L (ref 38–126)
ALT: 14 U/L — ABNORMAL LOW (ref 17–63)
ANION GAP: 8 (ref 5–15)
AST: 16 U/L (ref 15–41)
Albumin: 3.2 g/dL — ABNORMAL LOW (ref 3.5–5.0)
BUN: 12 mg/dL (ref 6–20)
CALCIUM: 8.8 mg/dL — AB (ref 8.9–10.3)
CO2: 26 mmol/L (ref 22–32)
Chloride: 104 mmol/L (ref 101–111)
Creatinine, Ser: 0.9 mg/dL (ref 0.61–1.24)
GFR calc Af Amer: >60 mL/min (ref >60)
GFR calc non Af Amer: >60 mL/min (ref >60)
GLUCOSE: 117 mg/dL — AB (ref 65–99)
Potassium: 4.2 mmol/L (ref 3.5–5.1)
SODIUM: 138 mmol/L (ref 135–145)
Total Bilirubin: 0.5 mg/dL (ref 0.3–1.2)
Total Protein: 6.9 g/dL (ref 6.5–8.1)

## 2017-09-24 LAB — TSH: TSH: 1.672 u[IU]/mL (ref 0.350–4.500)

## 2017-09-24 LAB — CORTISOL: Cortisol, Plasma: 9.5 ug/dL

## 2017-09-24 MED ORDER — SODIUM CHLORIDE 0.9 % IV SOLN
340.0000 mg | Freq: Once | INTRAVENOUS | Status: AC
  Administered 2017-09-24: 340 mg via INTRAVENOUS
  Filled 2017-09-24: qty 34

## 2017-09-24 MED ORDER — SODIUM CHLORIDE 0.9 % IV SOLN
Freq: Once | INTRAVENOUS | Status: AC
  Administered 2017-09-24: 11:00:00 via INTRAVENOUS
  Filled 2017-09-24: qty 1000

## 2017-09-24 MED ORDER — OXYCODONE-ACETAMINOPHEN 10-325 MG PO TABS: 1.0000 | ORAL_TABLET | Freq: Three times a day (TID) | ORAL | 0 refills | Status: DC | PRN

## 2017-09-24 MED ORDER — SODIUM CHLORIDE 0.9 % IV SOLN
100.0000 mg | Freq: Once | INTRAVENOUS | Status: AC
  Administered 2017-09-24: 100 mg via INTRAVENOUS
  Filled 2017-09-24: qty 20

## 2017-09-24 MED ORDER — MORPHINE SULFATE ER 30 MG PO TBCR: 30.0000 mg | EXTENDED_RELEASE_TABLET | Freq: Three times a day (TID) | ORAL | 0 refills | Status: DC

## 2017-09-24 NOTE — Telephone Encounter (Signed)
I spoke with Caryl Pina at [facility] and she states that they have prescription ready to be picked up.

## 2017-09-24 NOTE — Progress Notes (Signed)
[facility] NOTE  Patient Care Team: Earlie Server, MD as PCP - General (Oncology)  CHIEF COMPLAINTS/PURPOSE OF CONSULTATION:  Metastatic renal cell carcinoma     Renal cell carcinoma of right kidney ([place])   07/14/2017 Initial Diagnosis    Renal cell carcinoma of right kidney [facility])     This is my first interaction with the patient as patient's primary oncologist has been Dr.Yu. I reviewed the patient's prior charts/pertinent labs/imaging in detail.    HISTORY OF PRESENTING ILLNESS:  Larry Watkins 56 y.o.  male with a history of metastatic renal cell carcinoma currently on dual immunotherapy- opdivo + ipi is here for follow-up.  He was recently seen in the emergency room for shortness of breath cough-Told to have "bronchitis" this seems to be improving.  Patient complains of pain in his right hip. He states his current pain regimen is not helping the pain. He also complains of moderate fatigue; denies any headaches. States that he has been sleeping "all day". Denies any nausea vomiting. Appetite is fair. He denies any diarrhea. Mild to moderate constipation.  ROS: A complete 10 point review of system is done which is negative except mentioned above in history of present illness  MEDICAL HISTORY:  Past Medical History:  Diagnosis Date  . Bone metastasis ([place]) 07/17/2017  . Bone metastasis ([place]) 07/17/2017  . Metastatic renal cell carcinoma ([place])     SURGICAL HISTORY: Past Surgical History:  Procedure Laterality Date  . FLEXIBLE BRONCHOSCOPY N/A 07/24/2017   Procedure: FLEXIBLE BRONCHOSCOPY;  Surgeon: [name], MD;  Location: [facility] ORS;  Service: Pulmonary;  Laterality: N/A;  . HERNIA REPAIR    . left middle amputation      with reattachment    SOCIAL HISTORY: Social History   Social History  . Marital status: Single    Spouse name: N/A  . Number of children: N/A  . Years of education: N/A   Occupational History  . Not on file.   Social  History Main Topics  . Smoking status: Current Every Day Smoker    Packs/day: 0.25    Years: 38.00  . Smokeless tobacco: Never Used  . Alcohol use No  . Drug use: No  . Sexual activity: Not on file   Other Topics Concern  . Not on file   Social History Narrative  . No narrative on file    FAMILY HISTORY: Family History  Problem Relation Age of Onset  . Cancer Paternal Uncle        brain    ALLERGIES:  has No Known Allergies.  MEDICATIONS:  Current Outpatient Prescriptions  Medication Sig Dispense Refill  . gabapentin (NEURONTIN) 300 MG capsule Take 1 capsule (300 mg total) by mouth 3 (three) times daily. 90 capsule 0  . morphine (MS CONTIN) 30 MG 12 hr tablet Take 1 tablet (30 mg total) by mouth every 8 (eight) hours. 21 tablet 0  . ondansetron (ZOFRAN ODT) 4 MG disintegrating tablet Take 1 tablet (4 mg total) by mouth every 8 (eight) hours as needed for nausea or vomiting. 20 tablet 0  . oxyCODONE-acetaminophen (PERCOCET) 10-325 MG tablet Take 1 tablet by mouth every 8 (eight) hours as needed for pain. 21 tablet 0   No current facility-administered medications for this visit.       Marland Kitchen  PHYSICAL EXAMINATION: ECOG PERFORMANCE STATUS: 2 - Symptomatic, <50% confined to bed  Vitals:   09/24/17 0935  BP: 119/78  Pulse: 87  Resp:  14  Temp: (!) 97 F (36.1 C)   Filed Weights   09/24/17 0935  Weight: 228 lb 12.8 oz (103.8 kg)    GENERAL: Well-nourished well-developed; Alert, no distress and comfortable.   Accompanied by family. He is a wheelchair. EYES: no pallor or icterus OROPHARYNX: no thrush or ulceration; good dentition  NECK: supple, no masses felt LYMPH:  no palpable lymphadenopathy in the cervical, axillary or inguinal regions LUNGS: clear to auscultation and  No wheeze or crackles HEART/CVS: regular rate & rhythm and no murmurs; No lower extremity edema ABDOMEN: abdomen soft, non-tender and normal bowel sounds Musculoskeletal:no cyanosis of digits and no  clubbing  PSYCH: alert & oriented x 3 with fluent speech NEURO: no focal motor/sensory deficits SKIN:  no rashes or significant lesions  LABORATORY DATA:  I have reviewed the data as listed Lab Results  Component Value Date   WBC 3.9 09/24/2017   HGB 13.1 09/24/2017   HCT 38.0 (L) 09/24/2017   MCV 87.6 09/24/2017   PLT 263 09/24/2017    Recent Labs  09/03/17 0911 09/08/17 2154 09/24/17 0850  NA 135 137 138  K 4.6 4.1 4.2  CL 101 102 104  CO2 _0 GLUCOSE 90 118* 117*  BUN _1 CREATININE 0.68 0.80 0.90  CALCIUM 8.7* 9.2 8.8*  GFRNONAA >60 >60 >60  GFRAA >60 >60 >60  PROT 7.1 7.3 6.9  ALBUMIN 3.4* 3.4* 3.2*  AST _2 ALT 14* 12* 14*  ALKPHOS 113 108 97  BILITOT 0.4 0.3 0.5    RADIOGRAPHIC STUDIES: I have personally reviewed the radiological images as listed and agreed with the findings in the report. Dg Chest Port 1 View  Result Date: 09/08/2017 CLINICAL DATA:  Chills and fever.  Nausea. EXAM: PORTABLE CHEST 1 VIEW COMPARISON:  Bone scan 08/11/2017, CT 07/15/2017 FINDINGS: Mildly prominent hilar and mediastinal contours on the right, significantly improved from 07/15/2017. The lungs are clear. The pulmonary vasculature is normal. No pleural effusion. Skeletal metastatic lesions are visible at the right scapular body and left coracoid-glenoid. IMPRESSION: 1. No acute cardiopulmonary findings. Reduced size of the right hilar mass with improved right lung aeration. 2. Known skeletal metastatic disease. Electronically Signed   By: Andreas Newport M.D.   On: 09/08/2017 22:08    ASSESSMENT & PLAN:   Renal cell carcinoma of right kidney ([place]) # Metastatic kidney cancer- proceed with cycle # 4 of Nivo+Ipi today. He seems to be tolerating fairly well-without any major side effects- except moderate fatigue [C discussion below] No obvious clinical signs of progression.   # Fatigue-? Immunotherapy side effect; clinically less likely. I suspect narcotics. Check  TSH/cortisol; scan MRI- brain ASAP.   # Pain-poorly controlled- increase MS contin TID; perccocet 10 every 8 hours prn. Patient was given a prescription worth for 1 week. Patient has run out of his pain medication a week prior to his scheduled refill. As per clinic staff- there is concern for diversion of narcotics. If this continues to be a problem I think fentanyl patch might be reasonable long-acting option; with strict recommendations regarding pain regimen.   # CT scan prior-scheduled for November 7th; follow up in 3 weeks/labs/ Dr.Yu.   All questions were answered. The patient knows to call the clinic with any problems, questions or concerns.    Cammie Sickle, MD 09/24/2017 5:08 PM

## 2017-09-24 NOTE — Telephone Encounter (Signed)
They thought it was to go to [facility]

## 2017-09-24 NOTE — Telephone Encounter (Signed)
Gabpentin prescription not at pharmacy as discussed at visit today

## 2017-09-24 NOTE — Assessment & Plan Note (Addendum)
#   Metastatic kidney cancer- proceed with cycle # 4 of Nivo+Ipi today. He seems to be tolerating fairly well-without any major side effects- except moderate fatigue [C discussion below] No obvious clinical signs of progression.   # Fatigue-? Immunotherapy side effect; clinically less likely. I suspect narcotics. Check TSH/cortisol; scan MRI- brain ASAP.   # Pain-poorly controlled- increase MS contin TID; perccocet 10 every 8 hours prn. Patient was given a prescription worth for 1 week. Patient has run out of his pain medication a week prior to his scheduled refill. As per clinic staff- there is concern for diversion of narcotics. If this continues to be a problem I think fentanyl patch might be reasonable long-acting option; with strict recommendations regarding pain regimen.   # CT scan prior-scheduled for November 7th; follow up in 3 weeks/labs/ Dr.Yu.

## 2017-09-25 ENCOUNTER — Telehealth: Payer: Self-pay | Admitting: *Deleted

## 2017-09-25 MED ORDER — GABAPENTIN 300 MG PO CAPS: 300.0000 mg | ORAL_CAPSULE | Freq: Three times a day (TID) | ORAL | 0 refills | Status: DC

## 2017-09-25 NOTE — Telephone Encounter (Signed)
[facility] did not get prescription from [place] for Gabapentin.

## 2017-10-01 ENCOUNTER — Other Ambulatory Visit: Payer: Self-pay | Admitting: *Deleted

## 2017-10-01 MED ORDER — MORPHINE SULFATE ER 30 MG PO TBCR: 30.0000 mg | EXTENDED_RELEASE_TABLET | Freq: Three times a day (TID) | ORAL | 0 refills | Status: DC

## 2017-10-01 MED ORDER — OXYCODONE-ACETAMINOPHEN 10-325 MG PO TABS: 1.0000 | ORAL_TABLET | Freq: Three times a day (TID) | ORAL | 0 refills | Status: DC | PRN

## 2017-10-07 ENCOUNTER — Other Ambulatory Visit: Payer: Self-pay | Admitting: *Deleted

## 2017-10-07 MED ORDER — OXYCODONE-ACETAMINOPHEN 10-325 MG PO TABS: 1.0000 | ORAL_TABLET | Freq: Three times a day (TID) | ORAL | 0 refills | Status: DC | PRN

## 2017-10-07 MED ORDER — MORPHINE SULFATE ER 30 MG PO TBCR: 30.0000 mg | EXTENDED_RELEASE_TABLET | Freq: Three times a day (TID) | ORAL | 0 refills | Status: DC

## 2017-10-08 ENCOUNTER — Encounter
Admission: RE | Admit: 2017-10-08 | Discharge: 2017-10-08 | Disposition: A | Discharge status: Home / Self Care | Payer: Medicaid Other | Source: Ambulatory Visit | Attending: Oncology | Admitting: Oncology

## 2017-10-08 ENCOUNTER — Other Ambulatory Visit: Payer: Self-pay | Admitting: Oncology

## 2017-10-08 ENCOUNTER — Ambulatory Visit
Admission: RE | Admit: 2017-10-08 | Discharge: 2017-10-08 | Disposition: A | Discharge status: Home / Self Care | Payer: Medicaid Other | Source: Ambulatory Visit | Attending: Oncology | Admitting: Oncology

## 2017-10-08 DIAGNOSIS — C649 Malignant neoplasm of unspecified kidney, except renal pelvis: Secondary | ICD-10-CM

## 2017-10-08 DIAGNOSIS — R918 Other nonspecific abnormal finding of lung field: Secondary | ICD-10-CM | POA: Diagnosis not present

## 2017-10-08 DIAGNOSIS — I517 Cardiomegaly: Secondary | ICD-10-CM | POA: Diagnosis not present

## 2017-10-08 DIAGNOSIS — R599 Enlarged lymph nodes, unspecified: Secondary | ICD-10-CM | POA: Insufficient documentation

## 2017-10-08 MED ORDER — IOPAMIDOL (ISOVUE-300) INJECTION 61%
100.0000 mL | Freq: Once | INTRAVENOUS | Status: AC | PRN
  Administered 2017-10-08: 100 mL via INTRAVENOUS

## 2017-10-08 MED ORDER — TECHNETIUM TC 99M MEDRONATE IV KIT
24.1000 | PACK | Freq: Once | INTRAVENOUS | Status: AC | PRN
  Administered 2017-10-08: 24.1 via INTRAVENOUS

## 2017-10-09 ENCOUNTER — Ambulatory Visit: Payer: Self-pay | Admitting: Radiation Oncology

## 2017-10-10 ENCOUNTER — Telehealth: Payer: Self-pay | Admitting: *Deleted

## 2017-10-10 NOTE — Telephone Encounter (Signed)
Patient called in regards to his upcoming appt. Message was left. Call was returned. Patient is aware of his appt for Lab/MD/Infusion on 10/15/17. Also a reminder letter was mailed out.

## 2017-10-15 ENCOUNTER — Other Ambulatory Visit: Payer: Self-pay

## 2017-10-15 ENCOUNTER — Telehealth: Payer: Self-pay | Admitting: Oncology

## 2017-10-15 ENCOUNTER — Inpatient Hospital Stay: Payer: Medicaid Other | Attending: Oncology

## 2017-10-15 ENCOUNTER — Inpatient Hospital Stay: Payer: Medicaid Other

## 2017-10-15 ENCOUNTER — Encounter: Payer: Self-pay | Admitting: Oncology

## 2017-10-15 ENCOUNTER — Inpatient Hospital Stay (HOSPITAL_BASED_OUTPATIENT_CLINIC_OR_DEPARTMENT_OTHER): Payer: Medicaid Other | Admitting: Oncology

## 2017-10-15 VITALS — BP 130/84 | HR 91 | Temp 96.5°F | Resp 18 | Wt 221.4 lb

## 2017-10-15 DIAGNOSIS — C7801 Secondary malignant neoplasm of right lung: Secondary | ICD-10-CM | POA: Diagnosis not present

## 2017-10-15 DIAGNOSIS — C641 Malignant neoplasm of right kidney, except renal pelvis: Secondary | ICD-10-CM

## 2017-10-15 DIAGNOSIS — G893 Neoplasm related pain (acute) (chronic): Secondary | ICD-10-CM | POA: Diagnosis not present

## 2017-10-15 DIAGNOSIS — F17210 Nicotine dependence, cigarettes, uncomplicated: Secondary | ICD-10-CM

## 2017-10-15 DIAGNOSIS — K921 Melena: Secondary | ICD-10-CM

## 2017-10-15 DIAGNOSIS — Z5112 Encounter for antineoplastic immunotherapy: Secondary | ICD-10-CM

## 2017-10-15 DIAGNOSIS — R109 Unspecified abdominal pain: Secondary | ICD-10-CM | POA: Insufficient documentation

## 2017-10-15 DIAGNOSIS — M25511 Pain in right shoulder: Secondary | ICD-10-CM

## 2017-10-15 DIAGNOSIS — C7951 Secondary malignant neoplasm of bone: Secondary | ICD-10-CM | POA: Diagnosis not present

## 2017-10-15 DIAGNOSIS — Z79899 Other long term (current) drug therapy: Secondary | ICD-10-CM

## 2017-10-15 DIAGNOSIS — C649 Malignant neoplasm of unspecified kidney, except renal pelvis: Secondary | ICD-10-CM

## 2017-10-15 LAB — CBC WITH DIFFERENTIAL/PLATELET
BASOS ABS: 0 10*3/uL (ref 0–0.1)
BASOS PCT: 1 %
EOS ABS: 0.1 10*3/uL (ref 0–0.7)
Eosinophils Relative: 2 %
HEMATOCRIT: 39.7 % — AB (ref 40.0–52.0)
HEMOGLOBIN: 13.6 g/dL (ref 13.0–18.0)
Lymphocytes Relative: 24 %
Lymphs Abs: 1 10*3/uL (ref 1.0–3.6)
MCH: 29.5 pg (ref 26.0–34.0)
MCHC: 34.1 g/dL (ref 32.0–36.0)
MCV: 86.5 fL (ref 80.0–100.0)
Monocytes Absolute: 0.3 10*3/uL (ref 0.2–1.0)
Monocytes Relative: 8 %
NEUTROS ABS: 2.8 10*3/uL (ref 1.4–6.5)
NEUTROS PCT: 65 %
PLATELETS: 260 10*3/uL (ref 150–440)
RBC: 4.59 MIL/uL (ref 4.40–5.90)
RDW: 14.3 % (ref 11.5–14.5)
WBC: 4.3 10*3/uL (ref 3.8–10.6)

## 2017-10-15 LAB — COMPREHENSIVE METABOLIC PANEL
ALBUMIN: 3.5 g/dL (ref 3.5–5.0)
ALK PHOS: 118 U/L (ref 38–126)
ALT: 19 U/L (ref 17–63)
ANION GAP: 8 (ref 5–15)
AST: 21 U/L (ref 15–41)
BUN: 11 mg/dL (ref 6–20)
CALCIUM: 8.9 mg/dL (ref 8.9–10.3)
CHLORIDE: 105 mmol/L (ref 101–111)
CO2: 25 mmol/L (ref 22–32)
CREATININE: 0.88 mg/dL (ref 0.61–1.24)
GFR calc non Af Amer: >60 mL/min (ref >60)
GLUCOSE: 150 mg/dL — AB (ref 65–99)
Potassium: 3.9 mmol/L (ref 3.5–5.1)
SODIUM: 138 mmol/L (ref 135–145)
TOTAL PROTEIN: 7.2 g/dL (ref 6.5–8.1)
Total Bilirubin: 0.3 mg/dL (ref 0.3–1.2)

## 2017-10-15 LAB — FERRITIN: Ferritin: 214 ng/mL (ref 24–336)

## 2017-10-15 LAB — IRON AND TIBC
IRON: 29 ug/dL — AB (ref 45–182)
SATURATION RATIOS: 14 % — AB (ref 17.9–39.5)
TIBC: 207 ug/dL — AB (ref 250–450)
UIBC: 178 ug/dL

## 2017-10-15 LAB — TSH: TSH: 1.507 u[IU]/mL (ref 0.350–4.500)

## 2017-10-15 MED ORDER — ONDANSETRON 4 MG PO TBDP: 4.0000 mg | ORAL_TABLET | Freq: Three times a day (TID) | ORAL | 0 refills | Status: DC | PRN

## 2017-10-15 MED ORDER — SODIUM CHLORIDE 0.9 % IV SOLN
INTRAVENOUS | Status: DC
  Administered 2017-10-15: 11:00:00 via INTRAVENOUS
  Filled 2017-10-15: qty 1000

## 2017-10-15 MED ORDER — SODIUM CHLORIDE 0.9 % IV SOLN
240.0000 mg | Freq: Once | INTRAVENOUS | Status: AC
  Administered 2017-10-15: 240 mg via INTRAVENOUS
  Filled 2017-10-15: qty 24

## 2017-10-15 MED ORDER — OXYCODONE-ACETAMINOPHEN 10-325 MG PO TABS: 1.0000 | ORAL_TABLET | Freq: Two times a day (BID) | ORAL | 0 refills | Status: DC | PRN

## 2017-10-15 MED ORDER — MORPHINE SULFATE ER 30 MG PO TBCR: 30.0000 mg | EXTENDED_RELEASE_TABLET | Freq: Three times a day (TID) | ORAL | 0 refills | Status: DC

## 2017-10-15 NOTE — Progress Notes (Signed)
Here for follow up  See follow up area for additional  info

## 2017-10-15 NOTE — Telephone Encounter (Signed)
Lab/MD/Opdivo schd mid day due to MD availability. Rad/Onc appt schd.

## 2017-10-15 NOTE — Progress Notes (Signed)
[facility] follow up visit  Date of visit: 10/15/17 REASON FOR VISIT Follow up for treatment of metastatic renal cell cancer.  HISTORY OF PRESENT DISEASE/ PERTINENT ONCOLOGY HISTORY 1[name] is  56 y.o. male with 40 pack year smoking histroy is here for evaluation of abnormal image finding. Patient has had right shoulder pain  three months after reaching below a bar to grab a glass. Patient is not able to raise his right arm over shoulder level.  Also having left flank pain associated with pink urine for a few days and resolved after taking a course of amoxicillin. Patient has 40 pack year smoking history.    2 Images:  06/26/2017 CT scan showed a concerning right kidney mass with possible metastatic disease with thoracic and abdominal lymphadenopathy, lung nodules, and diffuse osseous metastases. 07/15/2017 CT PE protocol for evaluation of shortness of breath or chest pain showed no PE, however right lung collapse and rapid progression of lung adenopathy.  08/10/2017 ED visit due to uncontrolled pain: CT hip showed lucent bone lesions in the left hip and left acetabulum consistent with known metastatic disease. No evidence of pathological fracture.  08/11/2017 Bone scan: FINDINGS:Numerous areas of abnormal bony uptake noted in the skull, bilateral scapulae, sternum, cervical and thoracic spine, posterior left tenth rib, and right iliac bone compatible with metastatic disease. Degenerative uptake in the ankles and feet. Soft tissue activity unremarkable. IMPRESSION:Extensive osseous metastatic disease as above.   3 Pathology:     07/10/2017 07/24/2017 A. Lung, right upper lobe, endobronchial biopsy DIAGNOSIS: A. LUNG, RIGHT UPPER LOBE; ENDOBRONCHIAL BIOPSY: - METASTATIC CARCINOMA CONSISTENT WITH CLEAR CELL RENAL CELL CARCINOMA    Bronchoscopy on 07/24/2017 and biopsy revealed metastatic renal cell cancer.   4 Treatment 07/22/2017. nivolumab and ipilimumab, s/p 4 cycles.  S/p  palliative RT to lung and involved bone mets     5 Pain medication:  Patient finished one month supply of percocet in about one week. So his pain medication Rx are being dispensed weekly and he gets one week supply at one time. Multiple discussion about importance of taking pain medication as instructed.  During one clinic visit, patient was on the phone talking to someone and RN overheard that he said "I am right now at [place] office for the RX, get the money ready".   Larry Watkins 56 y.o.  male with above history who presents for evaluation prior to cycle 1 maintenance Nivolumab, .  Problems and complaints are listed below: Neoplasm related fatigue:at base line.   Neoplasm related pain scale: chronic pain has improved. New right shoulder pain.   SOB: resolved.  Report to have bright red blood in the stool, never had colonoscopy.    Review of Systems  Constitutional: Positive for unexpected weight change. Negative for appetite change.  HENT:   Negative for hearing loss.   Eyes: Negative for eye problems.  Respiratory: Negative for chest tightness and shortness of breath.   Cardiovascular: Negative for chest pain.  Gastrointestinal: Negative for abdominal pain.       Right lower quadrant  Endocrine: Negative for hot flashes.  Genitourinary: Negative for dysuria.   Musculoskeletal: Positive for arthralgias. Negative for back pain.       Right hip pain.  Skin: Negative for itching and rash.  Neurological: Negative for dizziness.  Hematological: Negative for adenopathy.  Psychiatric/Behavioral: The patient is not nervous/anxious.     MEDICAL HISTORY: Past Medical History:  Diagnosis Date  . Bone  metastasis ([place]) 07/17/2017  . Bone metastasis ([place]) 07/17/2017  . Metastatic renal cell carcinoma ([place])     SURGICAL HISTORY: Past Surgical History:  Procedure Laterality Date  . HERNIA REPAIR    . left middle amputation      with reattachment    SOCIAL HISTORY: Social  History   Socioeconomic History  . Marital status: Single    Spouse name: Not on file  . Number of children: Not on file  . Years of education: Not on file  . Highest education level: Not on file  Social Needs  . Financial resource strain: Not on file  . Food insecurity - worry: Not on file  . Food insecurity - inability: Not on file  . Transportation needs - medical: Not on file  . Transportation needs - non-medical: Not on file  Occupational History  . Not on file  Tobacco Use  . Smoking status: Current Every Day Smoker    Packs/day: 0.25    Years: 38.00    Pack years: 9.50  . Smokeless tobacco: Never Used  Substance and Sexual Activity  . Alcohol use: No  . Drug use: No  . Sexual activity: Not on file  Other Topics Concern  . Not on file  Social History Narrative  . Not on file    FAMILY HISTORY Family History  Problem Relation Age of Onset  . Cancer Paternal Uncle        brain    ALLERGIES:  has No Known Allergies.  MEDICATIONS:  Current Outpatient Medications  Medication Sig Dispense Refill  . gabapentin (NEURONTIN) 300 MG capsule Take 1 capsule (300 mg total) by mouth 3 (three) times daily. 90 capsule 0  . morphine (MS CONTIN) 30 MG 12 hr tablet Take 1 tablet (30 mg total) every 8 (eight) hours by mouth. 21 tablet 0  . ondansetron (ZOFRAN ODT) 4 MG disintegrating tablet Take 1 tablet (4 mg total) every 8 (eight) hours as needed by mouth for nausea or vomiting. 20 tablet 0  . oxyCODONE-acetaminophen (PERCOCET) 10-325 MG tablet Take 1 tablet every 12 (twelve) hours as needed by mouth for pain. 14 tablet 0   No current facility-administered medications for this visit.    Vitals:   10/15/17 0907  BP: 130/84  Pulse: 91  Resp: 18  Temp: (!) 96.5 F (35.8 C)   Filed Weights   10/15/17 0907  Weight: 221 lb 6.4 oz (100.4 kg)   Physical Exam  Constitutional: He appears well-developed. He appears distressed.  HENT:  Head: Normocephalic.  Eyes: Pupils are  equal, round, and reactive to light.  Neck: Neck supple.  Cardiovascular: Normal rate, regular rhythm and normal heart sounds.  Pulmonary/Chest: Effort normal and breath sounds normal.  Abdominal: Soft. Bowel sounds are normal.    LABORATORY DATA: I have personally reviewed the data as listed: CBC    Component Value Date/Time   WBC 4.3 10/15/2017 0855   RBC 4.59 10/15/2017 0855   HGB 13.6 10/15/2017 0855   HCT 39.7 (L) 10/15/2017 0855   PLT 260 10/15/2017 0855   MCV 86.5 10/15/2017 0855   MCH 29.5 10/15/2017 0855   MCHC 34.1 10/15/2017 0855   RDW 14.3 10/15/2017 0855   LYMPHSABS 1.0 10/15/2017 0855   MONOABS 0.3 10/15/2017 0855   EOSABS 0.1 10/15/2017 0855   BASOSABS 0.0 10/15/2017 0855   CMP Latest Ref Rng & Units 10/15/2017 09/24/2017 09/08/2017  Glucose 65 - 99 mg/dL 150(H) 117(H) 118(H)  BUN 6 - 20  mg/dL 11 12 12   Creatinine 0.61 - 1.24 mg/dL 0.88 0.90 0.80  Sodium 135 - 145 mmol/L 138 138 137  Potassium 3.5 - 5.1 mmol/L 3.9 4.2 4.1  Chloride 101 - 111 mmol/L 105 104 102  CO2 22 - 32 mmol/L 25 26 27   Calcium 8.9 - 10.3 mg/dL 8.9 8.8(L) 9.2  Total Protein 6.5 - 8.1 g/dL 7.2 6.9 7.3  Total Bilirubin 0.3 - 1.2 mg/dL 0.3 0.5 0.3  Alkaline Phos 38 - 126 U/L 118 97 108  AST 15 - 41 U/L 21 16 16   ALT 17 - 63 U/L 19 14(L) 12(L)    RADIOGRAPHIC STUDIES: I have personally reviewed the radiological images as listed and agree with the findings in the report CT chest abdomen pelvis 06/26/2017  IMPRESSION: 1. 7 cm right lower pole renal mass. This may reflect urothelial carcinoma given involvement of the collecting system versus renal cell carcinoma. 2. Metastatic disease with thoracic and abdominal lymphadenopathy, lung nodules, and diffuse osseous metastases. 3. Subcentimeter lesion in the right upper lobe bronchus with diffuse abnormal right upper lobe bronchial wall soft tissue thickening.  07/15/2017 No evidence of pulmonary emboli. Attenuation of the pulmonary  arterial branches is noted related to hilar adenopathy. Increase in the size of the central right hilar mass with increase in mediastinal and hilar adenopathy when compared with the recent exam. New right upper lobe collapse secondary to bronchial occlusion.Additionally some patchy infiltrative changes noted in the lower lobe likely related to the acute infiltrate. Diffuse bony metastatic disease with evidence of pathologic fracture involving the left tenth rib lesion.  Pathology 07/10/2017 Surgical Pathology  CASE: [id number]  PATIENT: Larry Watkins  Surgical Pathology Report  DIAGNOSIS:  A. LYTIC BONE LESION, RIGHT ILIAC CREST; CT-GUIDED BIOPSY:  - METASTATIC CARCINOMA, PAX-8 POSITIVE, COMPATIBLE WITH METASTATIC CLEAR  CELL RENAL CELL CARCINOMA.    Surgical Pathology 07/24/2017 CASE: [id number]  SPECIMEN SUBMITTED:  A. Lung, right upper lobe, endobronchial biopsy  DIAGNOSIS:  A. LUNG, RIGHT UPPER LOBE; ENDOBRONCHIAL BIOPSY:  - METASTATIC CARCINOMA CONSISTENT WITH CLEAR CELL RENAL CELL CARCINOMA  07/24/2017 Cytology - Non PAP  CASE: [id number]  SPECIMEN SUBMITTED:  A. Lung, right upper lobe; bronchoscopy brushing  DIAGNOSIS:  A. LUNG, RIGHT UPPER LOBE; BRONCHOSCOPY BRUSHING:  - POSITIVE FOR MALIGNANCY.  - METASTATIC CARCINOMA CONSISTENT WITH CLEAR CELL RENAL CELL CARCINOMA  ASSESSMENT/PLAN Cancer Staging Renal cell carcinoma of right kidney [facility]) Staging form: Kidney, AJCC 8th Edition - Clinical stage from 07/14/2017: Stage IV (cT3a, cNX, pM1) - Signed by Earlie Server, MD on 07/15/2017 - Pathologic: No stage assigned - Unsigned  1. Renal cell carcinoma of right kidney ([place])   2. Encounter for antineoplastic immunotherapy   3. Metastasis to bone ([place])   4. Blood in stool    1/2, OK to proceed to cycle 1 single agent nivolumab maintenance,  s/p  first dose of Xgeva 08/13/2017.   # Refilled MS contin 30mg  BID and decreases pain medication to percocet 10-325 Q12h PRN, 1 week  supply.  # check stool occult. Refer to GI for colonoscopy # Follow up with Dr.Chrystal for palliative radiation of his right iliac crest lesion and T12, which showed increase size on recent scan.  Follow-up on in 2 weeks, Lab/MD/nivolumab/Zometa.   All questions were answered. The patient knows to call the clinic with any problems, questions or concerns.Earlie Server, MD  10/15/2017 8:02 PM

## 2017-10-16 ENCOUNTER — Telehealth: Payer: Self-pay | Admitting: Oncology

## 2017-10-16 NOTE — Telephone Encounter (Signed)
Xgeva added to 11828/18 visit, per 10/16/17 schd msg/Julie.

## 2017-10-20 ENCOUNTER — Ambulatory Visit: Payer: Self-pay | Admitting: Radiation Oncology

## 2017-10-21 ENCOUNTER — Other Ambulatory Visit: Payer: Self-pay | Admitting: Oncology

## 2017-10-21 MED ORDER — MORPHINE SULFATE ER 30 MG PO TBCR: 30.0000 mg | EXTENDED_RELEASE_TABLET | Freq: Two times a day (BID) | ORAL | 0 refills | Status: DC

## 2017-10-21 MED ORDER — OXYCODONE-ACETAMINOPHEN 10-325 MG PO TABS: 1.0000 | ORAL_TABLET | Freq: Two times a day (BID) | ORAL | 0 refills | Status: DC | PRN

## 2017-10-22 ENCOUNTER — Ambulatory Visit: Payer: Self-pay

## 2017-10-28 NOTE — Progress Notes (Signed)
[facility] follow up visit  Date of visit: 10/29/17 REASON FOR VISIT Follow up for treatment of metastatic renal cell cancer.  HISTORY OF PRESENT DISEASE/ PERTINENT ONCOLOGY HISTORY 1Jeffery E Stocks is  56 y.o. male with 40 pack year smoking histroy is here for evaluation of abnormal image finding. Patient has had right shoulder pain  three months after reaching below a bar to grab a glass. Patient is not able to raise his right arm over shoulder level.  Also having left flank pain associated with pink urine for a few days and resolved after taking a course of amoxicillin. Patient has 40 pack year smoking history.    2 Images:  06/26/2017 CT scan showed a concerning right kidney mass with possible metastatic disease with thoracic and abdominal lymphadenopathy, lung nodules, and diffuse osseous metastases. 07/15/2017 CT PE protocol for evaluation of shortness of breath or chest pain showed no PE, however right lung collapse and rapid progression of lung adenopathy.  08/10/2017 ED visit due to uncontrolled pain: CT hip showed lucent bone lesions in the left hip and left acetabulum consistent with known metastatic disease. No evidence of pathological fracture.  08/11/2017 Bone scan: FINDINGS:Numerous areas of abnormal bony uptake noted in the skull, bilateral scapulae, sternum, cervical and thoracic spine, posterior left tenth rib, and right iliac bone compatible with metastatic disease. Degenerative uptake in the ankles and feet. Soft tissue activity unremarkable. IMPRESSION:Extensive osseous metastatic disease as above.   3 Pathology:     07/10/2017 07/24/2017 A. Lung, right upper lobe, endobronchial biopsy DIAGNOSIS: A. LUNG, RIGHT UPPER LOBE; ENDOBRONCHIAL BIOPSY: - METASTATIC CARCINOMA CONSISTENT WITH CLEAR CELL RENAL CELL CARCINOMA    Bronchoscopy on 07/24/2017 and biopsy revealed metastatic renal cell cancer.   4 Treatment 07/22/2017. nivolumab and ipilimumab, s/p 4 cycles.  S/p  palliative RT to lung and involved bone mets     5 Pain medication:  Patient finished one month supply of percocet in about one week. So his pain medication Rx are being dispensed weekly and he gets one week supply at one time. Multiple discussion about importance of taking pain medication as instructed.  During one clinic visit, patient was on the phone talking to someone and RN overheard that he said "I am right now at [place] office for the RX, get the money ready".   MORIO WIDEN 56 y.o.  male with above history who presents for evaluation prior to  maintenance Nivolumab Q14 days, .  Problems and complaints are listed below: He continues to have pain which mostly on his hip and lower extremities.  he takes pain medication as needed and request a refill of his weekly narcotics supplied. He has a productive cough. Denies fever or chills. Denies shortness of breath. Denies diarrhea. He has followed up with radiation oncologist Dr. Baruch Gouty.    Review of Systems  Constitutional: Negative for appetite change and unexpected weight change.  Eyes: Negative for eye problems.  Respiratory: Positive for cough. Negative for chest tightness, hemoptysis and shortness of breath.   Cardiovascular: Negative for chest pain.  Gastrointestinal: Negative for abdominal pain and diarrhea.       Right lower quadrant  Endocrine: Negative for hot flashes.  Genitourinary: Negative for dysuria.   Musculoskeletal: Positive for arthralgias. Negative for back pain.       Right hip pain.  Skin: Negative for itching and rash.  Neurological: Negative for dizziness.  Hematological: Negative for adenopathy.  Psychiatric/Behavioral: The patient is not nervous/anxious.  MEDICAL HISTORY: Past Medical History:  Diagnosis Date  . Bone metastasis ([place]) 07/17/2017  . Bone metastasis ([place]) 07/17/2017  . Metastatic renal cell carcinoma ([place])     SURGICAL HISTORY: Past Surgical History:  Procedure Laterality Date   . FLEXIBLE BRONCHOSCOPY N/A 07/24/2017   Procedure: FLEXIBLE BRONCHOSCOPY;  Surgeon: [name], MD;  Location: [facility] ORS;  Service: Pulmonary;  Laterality: N/A;  . HERNIA REPAIR    . left middle amputation      with reattachment    SOCIAL HISTORY: Social History   Socioeconomic History  . Marital status: Single    Spouse name: Not on file  . Number of children: Not on file  . Years of education: Not on file  . Highest education level: Not on file  Social Needs  . Financial resource strain: Not on file  . Food insecurity - worry: Not on file  . Food insecurity - inability: Not on file  . Transportation needs - medical: Not on file  . Transportation needs - non-medical: Not on file  Occupational History  . Not on file  Tobacco Use  . Smoking status: Current Every Day Smoker    Packs/day: 0.25    Years: 38.00    Pack years: 9.50  . Smokeless tobacco: Never Used  Substance and Sexual Activity  . Alcohol use: No  . Drug use: No  . Sexual activity: Not on file  Other Topics Concern  . Not on file  Social History Narrative  . Not on file    FAMILY HISTORY Family History  Problem Relation Age of Onset  . Cancer Paternal Uncle        brain    ALLERGIES:  has No Known Allergies.  MEDICATIONS:  Current Outpatient Medications  Medication Sig Dispense Refill  . gabapentin (NEURONTIN) 300 MG capsule Take 1 capsule (300 mg total) by mouth 3 (three) times daily. 90 capsule 0  . morphine (MS CONTIN) 30 MG 12 hr tablet Take 1 tablet (30 mg total) by mouth every 12 (twelve) hours. 14 tablet 0  . ondansetron (ZOFRAN ODT) 4 MG disintegrating tablet Take 1 tablet (4 mg total) every 8 (eight) hours as needed by mouth for nausea or vomiting. 20 tablet 0  . oxyCODONE-acetaminophen (PERCOCET) 10-325 MG tablet Take 1 tablet by mouth every 12 (twelve) hours as needed for pain. 14 tablet 0   No current facility-administered medications for this visit.    Vitals:   10/29/17 1058   BP: 125/74  Pulse: 94  Resp: 20  Temp: (!) 96.3 F (35.7 C)   Filed Weights   10/29/17 1050 10/29/17 1058  Weight: 220 lb (99.8 kg) 220 lb (99.8 kg)   Physical Exam  Constitutional: He is oriented to person, place, and time. He appears well-developed. No distress.  HENT:  Head: Normocephalic.  Eyes: EOM are normal. Pupils are equal, round, and reactive to light.  Neck: Neck supple.  Cardiovascular: Normal rate, regular rhythm and normal heart sounds.  Pulmonary/Chest: Effort normal and breath sounds normal. No respiratory distress.  Abdominal: Soft. Bowel sounds are normal. He exhibits no distension.  Musculoskeletal: Normal range of motion. He exhibits no deformity.  Neurological: He is alert and oriented to person, place, and time.  Skin: Skin is warm and dry.  Psychiatric: He has a normal mood and affect.    LABORATORY DATA: I have personally reviewed the data as listed: CBC    Component Value Date/Time   WBC 4.3 10/15/2017 0855  RBC 4.59 10/15/2017 0855   HGB 13.6 10/15/2017 0855   HCT 39.7 (L) 10/15/2017 0855   PLT 260 10/15/2017 0855   MCV 86.5 10/15/2017 0855   MCH 29.5 10/15/2017 0855   MCHC 34.1 10/15/2017 0855   RDW 14.3 10/15/2017 0855   LYMPHSABS 1.0 10/15/2017 0855   MONOABS 0.3 10/15/2017 0855   EOSABS 0.1 10/15/2017 0855   BASOSABS 0.0 10/15/2017 0855   CMP Latest Ref Rng & Units 10/15/2017 09/24/2017 09/08/2017  Glucose 65 - 99 mg/dL 150(H) 117(H) 118(H)  BUN 6 - 20 mg/dL _0 Creatinine 0.61 - 1.24 mg/dL 0.88 0.90 0.80  Sodium 135 - 145 mmol/L 138 138 137  Potassium 3.5 - 5.1 mmol/L 3.9 4.2 4.1  Chloride 101 - 111 mmol/L 105 104 102  CO2 22 - 32 mmol/L _1 Calcium 8.9 - 10.3 mg/dL 8.9 8.8(L) 9.2  Total Protein 6.5 - 8.1 g/dL 7.2 6.9 7.3  Total Bilirubin 0.3 - 1.2 mg/dL 0.3 0.5 0.3  Alkaline Phos 38 - 126 U/L 118 97 108  AST 15 - 41 U/L _2 ALT 17 - 63 U/L 19 14(L) 12(L)    RADIOGRAPHIC STUDIES: I have personally reviewed  the radiological images as listed and agree with the findings in the report CT chest abdomen pelvis 06/26/2017  IMPRESSION: 1. 7 cm right lower pole renal mass. This may reflect urothelial carcinoma given involvement of the collecting system versus renal cell carcinoma. 2. Metastatic disease with thoracic and abdominal lymphadenopathy, lung nodules, and diffuse osseous metastases. 3. Subcentimeter lesion in the right upper lobe bronchus with diffuse abnormal right upper lobe bronchial wall soft tissue thickening.  07/15/2017 No evidence of pulmonary emboli. Attenuation of the pulmonary arterial branches is noted related to hilar adenopathy. Increase in the size of the central right hilar mass with increase in mediastinal and hilar adenopathy when compared with the recent exam. New right upper lobe collapse secondary to bronchial occlusion.Additionally some patchy infiltrative changes noted in the lower lobe likely related to the acute infiltrate. Diffuse bony metastatic disease with evidence of pathologic fracture involving the left tenth rib lesion.  Pathology 07/10/2017 Surgical Pathology  CASE: [id number]  PATIENT: Connie Pickar  Surgical Pathology Report  DIAGNOSIS:  A. LYTIC BONE LESION, RIGHT ILIAC CREST; CT-GUIDED BIOPSY:  - METASTATIC CARCINOMA, PAX-8 POSITIVE, COMPATIBLE WITH METASTATIC CLEAR  CELL RENAL CELL CARCINOMA.    Surgical Pathology 07/24/2017 CASE: [id number]  SPECIMEN SUBMITTED:  A. Lung, right upper lobe, endobronchial biopsy  DIAGNOSIS:  A. LUNG, RIGHT UPPER LOBE; ENDOBRONCHIAL BIOPSY:  - METASTATIC CARCINOMA CONSISTENT WITH CLEAR CELL RENAL CELL CARCINOMA  07/24/2017 Cytology - Non PAP  CASE: [id number]  SPECIMEN SUBMITTED:  A. Lung, right upper lobe; bronchoscopy brushing  DIAGNOSIS:  A. LUNG, RIGHT UPPER LOBE; BRONCHOSCOPY BRUSHING:  - POSITIVE FOR MALIGNANCY.  - METASTATIC CARCINOMA CONSISTENT WITH CLEAR CELL RENAL CELL  CARCINOMA  ASSESSMENT/PLAN Cancer Staging Renal cell carcinoma of right kidney [facility]) Staging form: Kidney, AJCC 8th Edition - Clinical stage from 07/14/2017: Stage IV (cT3a, cNX, pM1) - Signed by Earlie Server, MD on 07/15/2017 - Pathologic: No stage assigned - Unsigned  1. Renal cell carcinoma of right kidney ([place])   2. Encounter for antineoplastic immunotherapy   3. Metastasis to bone ([place])   4. Neoplasm related pain   5. Cough    #, OK to proceed to  nivolumab Q14 Days maintenance,  s/p  first dose of Xgeva 08/13/2017. Proceed Xgeva Q28 days.   #  Refilled MS contin 73m BID and decreases pain medication to percocet 10-325 Q12h PRN, 1 week supply.  # check stool occult. Refer to GI for colonoscopy. Stool occult was  ordered and patient hasn't submitted.  # Follow up with Dr.Chrystal for palliative radiation of his right iliac crest lesion and T12, which showed increase size on recent scan. Discussed with Dr. CBaruch Gouty most of his skeletal lesions appear slightly more active, probably due to transition from lytic to sclerotic appearance, likely treatment response. Will not need to be retreated at this point. We will obtain x-ray of the left femur to assess if any pathological fracture.  # Cough: trial of OTC Mucinex. If persistent, will obtain chest images to rule out pneumonitis.  Follow-up on in 2 weeks, Lab/MD/nivolumab All questions were answered. The patient knows to call the clinic with any problems, questions or concerns..Earlie Server MD  10/28/2017 10:47 PM

## 2017-10-29 ENCOUNTER — Inpatient Hospital Stay: Payer: Medicaid Other

## 2017-10-29 ENCOUNTER — Inpatient Hospital Stay (HOSPITAL_BASED_OUTPATIENT_CLINIC_OR_DEPARTMENT_OTHER): Payer: Medicaid Other | Admitting: Oncology

## 2017-10-29 ENCOUNTER — Encounter: Payer: Self-pay | Admitting: Oncology

## 2017-10-29 ENCOUNTER — Ambulatory Visit: Payer: Self-pay | Admitting: Radiation Oncology

## 2017-10-29 ENCOUNTER — Ambulatory Visit
Admission: RE | Admit: 2017-10-29 | Discharge: 2017-10-29 | Disposition: A | Discharge status: Home / Self Care | Payer: Medicaid Other | Source: Ambulatory Visit | Attending: Radiation Oncology | Admitting: Radiation Oncology

## 2017-10-29 ENCOUNTER — Encounter: Payer: Self-pay | Admitting: Radiation Oncology

## 2017-10-29 ENCOUNTER — Other Ambulatory Visit: Payer: Self-pay

## 2017-10-29 VITALS — BP 125/74 | HR 94 | Temp 96.3°F | Resp 20 | Wt 220.9 lb

## 2017-10-29 VITALS — BP 125/74 | HR 94 | Temp 96.3°F | Resp 20 | Wt 220.0 lb

## 2017-10-29 DIAGNOSIS — C641 Malignant neoplasm of right kidney, except renal pelvis: Secondary | ICD-10-CM | POA: Diagnosis not present

## 2017-10-29 DIAGNOSIS — C7951 Secondary malignant neoplasm of bone: Secondary | ICD-10-CM

## 2017-10-29 DIAGNOSIS — C649 Malignant neoplasm of unspecified kidney, except renal pelvis: Secondary | ICD-10-CM | POA: Insufficient documentation

## 2017-10-29 DIAGNOSIS — C7801 Secondary malignant neoplasm of right lung: Secondary | ICD-10-CM | POA: Diagnosis not present

## 2017-10-29 DIAGNOSIS — Z923 Personal history of irradiation: Secondary | ICD-10-CM | POA: Diagnosis not present

## 2017-10-29 DIAGNOSIS — C799 Secondary malignant neoplasm of unspecified site: Secondary | ICD-10-CM

## 2017-10-29 DIAGNOSIS — R109 Unspecified abdominal pain: Secondary | ICD-10-CM

## 2017-10-29 DIAGNOSIS — K921 Melena: Secondary | ICD-10-CM | POA: Diagnosis not present

## 2017-10-29 DIAGNOSIS — Z5112 Encounter for antineoplastic immunotherapy: Secondary | ICD-10-CM

## 2017-10-29 DIAGNOSIS — Z79899 Other long term (current) drug therapy: Secondary | ICD-10-CM

## 2017-10-29 DIAGNOSIS — M25511 Pain in right shoulder: Secondary | ICD-10-CM

## 2017-10-29 DIAGNOSIS — R059 Cough, unspecified: Secondary | ICD-10-CM

## 2017-10-29 DIAGNOSIS — F17210 Nicotine dependence, cigarettes, uncomplicated: Secondary | ICD-10-CM

## 2017-10-29 DIAGNOSIS — G893 Neoplasm related pain (acute) (chronic): Secondary | ICD-10-CM

## 2017-10-29 DIAGNOSIS — R05 Cough: Secondary | ICD-10-CM

## 2017-10-29 LAB — COMPREHENSIVE METABOLIC PANEL
ALBUMIN: 3.6 g/dL (ref 3.5–5.0)
ALK PHOS: 101 U/L (ref 38–126)
ALT: 15 U/L — ABNORMAL LOW (ref 17–63)
ANION GAP: 8 (ref 5–15)
AST: 13 U/L — ABNORMAL LOW (ref 15–41)
BILIRUBIN TOTAL: 0.3 mg/dL (ref 0.3–1.2)
BUN: 14 mg/dL (ref 6–20)
CALCIUM: 9 mg/dL (ref 8.9–10.3)
CO2: 28 mmol/L (ref 22–32)
Chloride: 102 mmol/L (ref 101–111)
Creatinine, Ser: 0.87 mg/dL (ref 0.61–1.24)
GFR calc Af Amer: >60 mL/min (ref >60)
GLUCOSE: 99 mg/dL (ref 65–99)
POTASSIUM: 4.5 mmol/L (ref 3.5–5.1)
Sodium: 138 mmol/L (ref 135–145)
Total Protein: 7.5 g/dL (ref 6.5–8.1)

## 2017-10-29 LAB — CBC WITH DIFFERENTIAL/PLATELET
Basophils Absolute: 0 10*3/uL (ref 0–0.1)
Basophils Relative: 1 %
Eosinophils Absolute: 0.1 10*3/uL (ref 0–0.7)
Eosinophils Relative: 2 %
HEMATOCRIT: 40.3 % (ref 40.0–52.0)
HEMOGLOBIN: 13.4 g/dL (ref 13.0–18.0)
Lymphocytes Relative: 23 %
Lymphs Abs: 1.4 10*3/uL (ref 1.0–3.6)
MCH: 29 pg (ref 26.0–34.0)
MCHC: 33.4 g/dL (ref 32.0–36.0)
MCV: 86.9 fL (ref 80.0–100.0)
Monocytes Absolute: 0.7 10*3/uL (ref 0.2–1.0)
Monocytes Relative: 11 %
NEUTROS ABS: 3.8 10*3/uL (ref 1.4–6.5)
Neutrophils Relative %: 63 %
Platelets: 289 10*3/uL (ref 150–440)
RBC: 4.63 MIL/uL (ref 4.40–5.90)
RDW: 14.1 % (ref 11.5–14.5)
WBC: 6 10*3/uL (ref 3.8–10.6)

## 2017-10-29 LAB — TSH: TSH: 1.013 u[IU]/mL (ref 0.350–4.500)

## 2017-10-29 MED ORDER — SODIUM CHLORIDE 0.9 % IV SOLN
Freq: Once | INTRAVENOUS | Status: AC
  Administered 2017-10-29: 12:00:00 via INTRAVENOUS
  Filled 2017-10-29: qty 1000

## 2017-10-29 MED ORDER — DENOSUMAB 120 MG/1.7ML ~~LOC~~ SOLN
120.0000 mg | Freq: Once | SUBCUTANEOUS | Status: AC
  Administered 2017-10-29: 120 mg via SUBCUTANEOUS
  Filled 2017-10-29: qty 1.7

## 2017-10-29 MED ORDER — MORPHINE SULFATE ER 30 MG PO TBCR: 30.0000 mg | EXTENDED_RELEASE_TABLET | Freq: Two times a day (BID) | ORAL | 0 refills | Status: DC

## 2017-10-29 MED ORDER — NIVOLUMAB CHEMO INJECTION 100 MG/10ML
240.0000 mg | Freq: Once | INTRAVENOUS | Status: AC
  Administered 2017-10-29: 240 mg via INTRAVENOUS
  Filled 2017-10-29: qty 24

## 2017-10-29 MED ORDER — OXYCODONE-ACETAMINOPHEN 10-325 MG PO TABS: 1.0000 | ORAL_TABLET | Freq: Two times a day (BID) | ORAL | 0 refills | Status: DC | PRN

## 2017-10-29 NOTE — Progress Notes (Signed)
Patient here for follow up with labs and treatment today. He states that he has been dealing with a productive cough for several weeks and would like something prescribed for this. He is complaining of right sided hip pain and is requesting refills on his morphine and oxycodone.

## 2017-10-29 NOTE — Progress Notes (Signed)
Radiation Oncology Follow up Note  Name: Larry Watkins   Date:   10/29/2017 MRN:  [id number] DOB: February 07, 1961    This 56 y.o. 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 He continues to have pain in his right hip in the area we've previously irradiated.. Recent bone scan shows previous lesions in his right hip and pelvis appear slightly more active although this is probably transitioning from previous lytic to current sclerotic appearance which could be a side effect of treatment. There is a new lesion present in the midshaft of the left femur. Patient is currently onmaintenance Nivolumab. He is tolerating that medication well.    COMPLICATIONS OF TREATMENT: none  FOLLOW UP COMPLIANCE: keeps appointments   PHYSICAL EXAM:  BP 125/74   Pulse 94   Temp (!) 96.3 F (35.7 C)   Resp 20   Wt 220 lb 14.4 oz (100.2 kg)   BMI 34.60 kg/m  Wheelchair-bound male in NAD. Range of motion his lower extremities does not elicit pain deep palpation of his right iliac crest does not elicit pain. Deep palpation of his spine does not elicit pain. Well-developed well-nourished patient in NAD. HEENT reveals PERLA, EOMI, discs not visualized.  Oral cavity is clear. No oral mucosal lesions are identified. Neck is clear without evidence of cervical or supraclavicular adenopathy. Lungs are clear to A&P. Cardiac examination is essentially unremarkable with regular rate and rhythm without murmur rub or thrill. Abdomen is benign with no organomegaly or masses noted. Motor sensory and DTR levels are equal and symmetric in the upper and lower extremities. Cranial nerves II through XII are grossly intact.  Proprioception is intact. No peripheral adenopathy or edema is identified. No motor or sensory levels are noted. Crude visual fields are within normal range.  RADIOLOGY RESULTS: Bone scans are reviewed and compatible with the above-stated findings  PLAN: At the present time I believe he is stable is far as pain is concerned. I've asked medical oncology today to do a plain film of his left femur possibly rule out cortical involvement of this new lesion. If there is cortical evidence of destruction I would offer single fraction hypo-fractionated course of palliative treatment to that area. Otherwise I would continue to observe and continue withmaintenance Nivolumab. Patient will see medical oncology today. I will follow up with the patient based on medical oncology's recommendations.  I would like to take this opportunity to thank you for allowing me to participate in the care of your patient.Armstead Peaks., MD

## 2017-10-31 ENCOUNTER — Ambulatory Visit: Payer: Self-pay

## 2017-11-04 ENCOUNTER — Other Ambulatory Visit: Payer: Self-pay | Admitting: *Deleted

## 2017-11-04 MED ORDER — OXYCODONE-ACETAMINOPHEN 10-325 MG PO TABS: 1.0000 | ORAL_TABLET | Freq: Two times a day (BID) | ORAL | 0 refills | Status: DC | PRN

## 2017-11-04 MED ORDER — MORPHINE SULFATE ER 30 MG PO TBCR: 30.0000 mg | EXTENDED_RELEASE_TABLET | Freq: Two times a day (BID) | ORAL | 0 refills | Status: DC

## 2017-11-04 NOTE — Telephone Encounter (Signed)
Patient left vm requesting refill of pain medication, Percocet and MS Contin, patient states he will pick up the Rx tomorrow morning.  Per Dr Tasia Catchings okay to refill.  Rx's printed and ready for signature.

## 2017-11-05 NOTE — Telephone Encounter (Signed)
Rx signed by Dr Tasia Catchings.  Rx picked up by pt today at 9:51am.

## 2017-11-06 ENCOUNTER — Telehealth: Payer: Self-pay

## 2017-11-06 NOTE — Telephone Encounter (Signed)
Nutrition Assessment   Reason for Assessment:   Patient identified on Malnutrition Screening report for weight loss and poor appetite  ASSESSMENT:  56 year old male with metastatic renal cell carcinoma.  Past medical history reviewed.  Patient currently on opdivo.  Spoke with patient via phone this am.  Patient reports he tries to eat throughout the day but the amount of food is less than what he use to eat.  Reports he has cough that MD says is normal and if he gets to coughing will throw up due to coughing spells.  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  Does not like ensure/boost drinks.   Nutrition Focused Physical Exam: deferred  Medications: reviewed  Labs: reviewed  Anthropometrics:   Height: 67 inches Weight: 220 lb 14.4 oz UBW: 250 lb per patient, unsure when at that weight Noted 04/09/17 at 242 lb BMI: 34   NUTRITION DIAGNOSIS: Inadequate oral intake related to cancer and cancer related treatments as evidenced by weight loss and decreased intake   INTERVENTION:   Offered nutrition appointment and patient declined.   Offered to send information via mail on ways to increase calories and protein and patient declined at this time.  Contact information provided and patient to contact me if needed    MONITORING, EVALUATION, GOAL: Patient will consume adequate calories and protein to meet nutritional needs   NEXT VISIT: patient to contact  Krislyn Donnan B. Zenia Resides, [place], [place] Registered Dietitian [phone] (pager)

## 2017-11-11 NOTE — Progress Notes (Signed)
[facility] follow up visit  Date of visit: 11/11/17 REASON FOR VISIT Follow up for treatment of metastatic renal cell cancer.  HISTORY OF PRESENT DISEASE/ PERTINENT ONCOLOGY HISTORY 1[name] is  56 y.o. male with 40 pack year smoking histroy is here for evaluation of abnormal image finding. Patient has had right shoulder pain  three months after reaching below a bar to grab a glass. Patient is not able to raise his right arm over shoulder level.  Also having left flank pain associated with pink urine for a few days and resolved after taking a course of amoxicillin. Patient has 40 pack year smoking history.    2 Images:  06/26/2017 CT scan showed a concerning right kidney mass with possible metastatic disease with thoracic and abdominal lymphadenopathy, lung nodules, and diffuse osseous metastases. 07/15/2017 CT PE protocol for evaluation of shortness of breath or chest pain showed no PE, however right lung collapse and rapid progression of lung adenopathy.  08/10/2017 ED visit due to uncontrolled pain: CT hip showed lucent bone lesions in the left hip and left acetabulum consistent with known metastatic disease. No evidence of pathological fracture.  08/11/2017 Bone scan: FINDINGS:Numerous areas of abnormal bony uptake noted in the skull, bilateral scapulae, sternum, cervical and thoracic spine, posterior left tenth rib, and right iliac bone compatible with metastatic disease. Degenerative uptake in the ankles and feet. Soft tissue activity unremarkable. IMPRESSION:Extensive osseous metastatic disease as above.   3 Pathology:     07/10/2017 07/24/2017 A. Lung, right upper lobe, endobronchial biopsy DIAGNOSIS: A. LUNG, RIGHT UPPER LOBE; ENDOBRONCHIAL BIOPSY: - METASTATIC CARCINOMA CONSISTENT WITH CLEAR CELL RENAL CELL CARCINOMA    Bronchoscopy on 07/24/2017 and biopsy revealed metastatic renal cell cancer.   4 Treatment 07/22/2017. nivolumab and ipilimumab, s/p 4 cycles.  S/p  palliative RT to lung and involved bone mets     5 Pain medication:  Patient finished one month supply of percocet in about one week. So his pain medication Rx are being dispensed weekly and he gets one week supply at one time. Multiple discussion about importance of taking pain medication as instructed.  During one clinic visit, patient was on the phone talking to someone and RN overheard that he said "I am right now at [place] office for the RX, get the money ready".    INTERVAL HISTORY  Larry Watkins 56 y.o.  male with above history who presents for evaluation prior to  maintenance Nivolumab Q14 days, .  Problems and complaints are listed below: Patient reports intermittent cough,  with clear sputum, worse at night. He continued to have right hip pain. He tells me that he takes MS Contin as scheduled, and Percocet usually1 or 2 during the day as needed.  Review of Systems  Constitutional: Negative for appetite change, chills and unexpected weight change.  HENT:   Negative for lump/mass.   Eyes: Negative for eye problems and icterus.  Respiratory: Positive for cough. Negative for chest tightness, hemoptysis and shortness of breath.   Cardiovascular: Negative for chest pain and leg swelling.  Gastrointestinal: Negative for abdominal distention, abdominal pain and diarrhea.       Right lower quadrant  Endocrine: Negative for hot flashes.  Genitourinary: Negative for difficulty urinating and dysuria.   Musculoskeletal: Positive for arthralgias. Negative for back pain, flank pain and gait problem.       Right hip pain.  Skin: Negative for itching and rash.  Neurological: Negative for dizziness and gait  problem.  Hematological: Negative for adenopathy. Does not bruise/bleed easily.  Psychiatric/Behavioral: Negative for confusion. The patient is not nervous/anxious.     MEDICAL HISTORY: Past Medical History:  Diagnosis Date  . Bone metastasis ([place]) 07/17/2017  . Bone metastasis ([place])  07/17/2017  . Metastatic renal cell carcinoma ([place])     SURGICAL HISTORY: Past Surgical History:  Procedure Laterality Date  . FLEXIBLE BRONCHOSCOPY N/A 07/24/2017   Procedure: FLEXIBLE BRONCHOSCOPY;  Surgeon: [name], MD;  Location: [facility] ORS;  Service: Pulmonary;  Laterality: N/A;  . HERNIA REPAIR    . left middle amputation      with reattachment    SOCIAL HISTORY: Social History   Socioeconomic History  . Marital status: Single    Spouse name: Not on file  . Number of children: Not on file  . Years of education: Not on file  . Highest education level: Not on file  Social Needs  . Financial resource strain: Not on file  . Food insecurity - worry: Not on file  . Food insecurity - inability: Not on file  . Transportation needs - medical: Not on file  . Transportation needs - non-medical: Not on file  Occupational History  . Not on file  Tobacco Use  . Smoking status: Current Every Day Smoker    Packs/day: 0.25    Years: 38.00    Pack years: 9.50  . Smokeless tobacco: Never Used  Substance and Sexual Activity  . Alcohol use: No  . Drug use: No  . Sexual activity: Not on file  Other Topics Concern  . Not on file  Social History Narrative  . Not on file    FAMILY HISTORY Family History  Problem Relation Age of Onset  . Cancer Paternal Uncle        brain    ALLERGIES:  has No Known Allergies.  MEDICATIONS:  Current Outpatient Medications  Medication Sig Dispense Refill  . gabapentin (NEURONTIN) 300 MG capsule Take 1 capsule (300 mg total) by mouth 3 (three) times daily. (Patient taking differently: Take 600 mg by mouth 2 (two) times daily. ) 90 capsule 0  . morphine (MS CONTIN) 30 MG 12 hr tablet Take 1 tablet (30 mg total) by mouth every 12 (twelve) hours for 14 days. 14 tablet 0  . ondansetron (ZOFRAN ODT) 4 MG disintegrating tablet Take 1 tablet (4 mg total) every 8 (eight) hours as needed by mouth for nausea or vomiting. 20 tablet 0  .  oxyCODONE-acetaminophen (PERCOCET) 10-325 MG tablet Take 1 tablet by mouth every 12 (twelve) hours as needed for up to 14 days for pain. 14 tablet 0   No current facility-administered medications for this visit.    There were no vitals filed for this visit. There were no vitals filed for this visit. Physical Exam  Constitutional: He is oriented to person, place, and time. He appears well-developed. No distress.  HENT:  Head: Normocephalic and atraumatic.  Eyes: EOM are normal. Pupils are equal, round, and reactive to light. No scleral icterus.  Neck: Neck supple. No JVD present.  Cardiovascular: Normal rate, regular rhythm and normal heart sounds.  No murmur heard. Pulmonary/Chest: Effort normal. No respiratory distress. He has wheezes.  Abdominal: Soft. Bowel sounds are normal. He exhibits no distension. There is no tenderness.  Musculoskeletal: Normal range of motion. He exhibits no tenderness or deformity.  Neurological: He is alert and oriented to person, place, and time. No cranial nerve deficit.  Skin: Skin is warm  and dry. No rash noted. No erythema.  Psychiatric: He has a normal mood and affect.    LABORATORY DATA: I have personally reviewed the data as listed: CBC    Component Value Date/Time   WBC 6.0 10/29/2017 0945   RBC 4.63 10/29/2017 0945   HGB 13.4 10/29/2017 0945   HCT 40.3 10/29/2017 0945   PLT 289 10/29/2017 0945   MCV 86.9 10/29/2017 0945   MCH 29.0 10/29/2017 0945   MCHC 33.4 10/29/2017 0945   RDW 14.1 10/29/2017 0945   LYMPHSABS 1.4 10/29/2017 0945   MONOABS 0.7 10/29/2017 0945   EOSABS 0.1 10/29/2017 0945   BASOSABS 0.0 10/29/2017 0945   CMP Latest Ref Rng & Units 10/29/2017 10/15/2017 09/24/2017  Glucose 65 - 99 mg/dL 99 150(H) 117(H)  BUN 6 - 20 mg/dL _0 Creatinine 0.61 - 1.24 mg/dL 0.87 0.88 0.90  Sodium 135 - 145 mmol/L 138 138 138  Potassium 3.5 - 5.1 mmol/L 4.5 3.9 4.2  Chloride 101 - 111 mmol/L 102 105 104  CO2 22 - 32 mmol/L _1 Calcium 8.9 - 10.3 mg/dL 9.0 8.9 8.8(L)  Total Protein 6.5 - 8.1 g/dL 7.5 7.2 6.9  Total Bilirubin 0.3 - 1.2 mg/dL 0.3 0.3 0.5  Alkaline Phos 38 - 126 U/L 101 118 97  AST 15 - 41 U/L 13(L) 21 16  ALT 17 - 63 U/L 15(L) 19 14(L)    RADIOGRAPHIC STUDIES: I have personally reviewed the radiological images as listed and agree with the findings in the report CT chest abdomen pelvis 06/26/2017  IMPRESSION: 1. 7 cm right lower pole renal mass. This may reflect urothelial carcinoma given involvement of the collecting system versus renal cell carcinoma. 2. Metastatic disease with thoracic and abdominal lymphadenopathy, lung nodules, and diffuse osseous metastases. 3. Subcentimeter lesion in the right upper lobe bronchus with diffuse abnormal right upper lobe bronchial wall soft tissue thickening.  07/15/2017 No evidence of pulmonary emboli. Attenuation of the pulmonary arterial branches is noted related to hilar adenopathy. Increase in the size of the central right hilar mass with increase in mediastinal and hilar adenopathy when compared with the recent exam. New right upper lobe collapse secondary to bronchial occlusion.Additionally some patchy infiltrative changes noted in the lower lobe likely related to the acute infiltrate. Diffuse bony metastatic disease with evidence of pathologic fracture involving the left tenth rib lesion.  Pathology 07/10/2017 Surgical Pathology  CASE: [id number]  PATIENT: Larry Watkins  Surgical Pathology Report  DIAGNOSIS:  A. LYTIC BONE LESION, RIGHT ILIAC CREST; CT-GUIDED BIOPSY:  - METASTATIC CARCINOMA, PAX-8 POSITIVE, COMPATIBLE WITH METASTATIC CLEAR  CELL RENAL CELL CARCINOMA.    Surgical Pathology 07/24/2017 CASE: [id number]  SPECIMEN SUBMITTED:  A. Lung, right upper lobe, endobronchial biopsy  DIAGNOSIS:  A. LUNG, RIGHT UPPER LOBE; ENDOBRONCHIAL BIOPSY:  - METASTATIC CARCINOMA CONSISTENT WITH CLEAR CELL RENAL CELL CARCINOMA  07/24/2017  Cytology - Non PAP  CASE: [id number]  SPECIMEN SUBMITTED:  A. Lung, right upper lobe; bronchoscopy brushing  DIAGNOSIS:  A. LUNG, RIGHT UPPER LOBE; BRONCHOSCOPY BRUSHING:  - POSITIVE FOR MALIGNANCY.  - METASTATIC CARCINOMA CONSISTENT WITH CLEAR CELL RENAL CELL CARCINOMA  ASSESSMENT/PLAN Cancer Staging Renal cell carcinoma of right kidney [facility]) Staging form: Kidney, AJCC 8th Edition - Clinical stage from 07/14/2017: Stage IV (cT3a, cNX, pM1) - Signed by Earlie Server, MD on 07/15/2017 - Pathologic: No stage assigned - Unsigned  1. Renal cell carcinoma of right kidney ([place])   2. Metastatic renal cell  carcinoma, unspecified laterality ([place])   3. Bone metastasis ([place])   4. Encounter for antineoplastic immunotherapy   5. Cough    #,proceed with nivolumab every 14 days maintenance.,  Continue Xgeva Q28 days, next dose due on December 28..   # patient asked if he can get 2 weeks supply has holidays coming he does not want to come every week to pick up his pain medication. He understands that he should usethe medication only when needed and as instructed. He understands that there will not be another refill if he used up his pain medication earlier than the time for refill. I refilled 2 weeks supply of MS contin 98m BID and percocet 10-325 Q12h PRN,   # check stool occult. He was previously refer to GI for colonoscopy. Stool occult was  ordered and patient hasn't submitted.  # obtain x-ray of the left femur to assess if any pathological fracture. Consider MRI right hip  # Cough/wheezing: chest X ray, Tessalon for cough.   chest X ray showed possible right upper lobe and lower lobe pneumonia, will give him a course of Z-PAK and a course of steroid taper dose pak.   Ventolin inhaler PRN for wheezing. If not improving, will repeat CT scan.   Follow-up on in 1 week to evaluate cough,  2 weeks, Lab/MD/nivolumab All questions were answered. The patient knows to call the clinic with any problems,  questions or concerns..Earlie Server MD  11/11/2017 11:59 AM

## 2017-11-12 ENCOUNTER — Inpatient Hospital Stay (HOSPITAL_BASED_OUTPATIENT_CLINIC_OR_DEPARTMENT_OTHER): Payer: Medicaid Other | Admitting: Oncology

## 2017-11-12 ENCOUNTER — Encounter: Payer: Self-pay | Admitting: Oncology

## 2017-11-12 ENCOUNTER — Ambulatory Visit
Admission: RE | Admit: 2017-11-12 | Discharge: 2017-11-12 | Disposition: A | Discharge status: Home / Self Care | Payer: Medicaid Other | Source: Ambulatory Visit | Attending: Oncology | Admitting: Oncology

## 2017-11-12 ENCOUNTER — Inpatient Hospital Stay: Payer: Medicaid Other | Attending: Oncology

## 2017-11-12 ENCOUNTER — Inpatient Hospital Stay: Payer: Medicaid Other

## 2017-11-12 VITALS — BP 117/81 | HR 80 | Temp 97.9°F | Resp 16 | Wt 218.0 lb

## 2017-11-12 DIAGNOSIS — Z923 Personal history of irradiation: Secondary | ICD-10-CM | POA: Insufficient documentation

## 2017-11-12 DIAGNOSIS — C641 Malignant neoplasm of right kidney, except renal pelvis: Secondary | ICD-10-CM

## 2017-11-12 DIAGNOSIS — Z5112 Encounter for antineoplastic immunotherapy: Secondary | ICD-10-CM

## 2017-11-12 DIAGNOSIS — J189 Pneumonia, unspecified organism: Secondary | ICD-10-CM | POA: Diagnosis not present

## 2017-11-12 DIAGNOSIS — C7951 Secondary malignant neoplasm of bone: Secondary | ICD-10-CM

## 2017-11-12 DIAGNOSIS — F17210 Nicotine dependence, cigarettes, uncomplicated: Secondary | ICD-10-CM

## 2017-11-12 DIAGNOSIS — C649 Malignant neoplasm of unspecified kidney, except renal pelvis: Secondary | ICD-10-CM

## 2017-11-12 DIAGNOSIS — R059 Cough, unspecified: Secondary | ICD-10-CM

## 2017-11-12 DIAGNOSIS — R05 Cough: Secondary | ICD-10-CM | POA: Insufficient documentation

## 2017-11-12 LAB — COMPREHENSIVE METABOLIC PANEL
ALK PHOS: 101 U/L (ref 38–126)
ALT: 19 U/L (ref 17–63)
AST: 15 U/L (ref 15–41)
Albumin: 3.8 g/dL (ref 3.5–5.0)
Anion gap: 8 (ref 5–15)
BUN: 16 mg/dL (ref 6–20)
CALCIUM: 9.3 mg/dL (ref 8.9–10.3)
CHLORIDE: 102 mmol/L (ref 101–111)
CO2: 27 mmol/L (ref 22–32)
CREATININE: 0.85 mg/dL (ref 0.61–1.24)
GFR calc Af Amer: >60 mL/min (ref >60)
GFR calc non Af Amer: >60 mL/min (ref >60)
Glucose, Bld: 101 mg/dL — ABNORMAL HIGH (ref 65–99)
Potassium: 4.7 mmol/L (ref 3.5–5.1)
SODIUM: 137 mmol/L (ref 135–145)
Total Bilirubin: 0.3 mg/dL (ref 0.3–1.2)
Total Protein: 7.7 g/dL (ref 6.5–8.1)

## 2017-11-12 LAB — CBC WITH DIFFERENTIAL/PLATELET
Basophils Absolute: 0 10*3/uL (ref 0–0.1)
Basophils Relative: 1 %
EOS ABS: 0.1 10*3/uL (ref 0–0.7)
EOS PCT: 2 %
HCT: 41.6 % (ref 40.0–52.0)
HEMOGLOBIN: 13.8 g/dL (ref 13.0–18.0)
LYMPHS ABS: 1.5 10*3/uL (ref 1.0–3.6)
Lymphocytes Relative: 28 %
MCH: 28.7 pg (ref 26.0–34.0)
MCHC: 33 g/dL (ref 32.0–36.0)
MCV: 86.9 fL (ref 80.0–100.0)
MONO ABS: 0.6 10*3/uL (ref 0.2–1.0)
MONOS PCT: 11 %
Neutro Abs: 3.3 10*3/uL (ref 1.4–6.5)
Neutrophils Relative %: 60 %
PLATELETS: 315 10*3/uL (ref 150–440)
RBC: 4.79 MIL/uL (ref 4.40–5.90)
RDW: 14.6 % — AB (ref 11.5–14.5)
WBC: 5.5 10*3/uL (ref 3.8–10.6)

## 2017-11-12 LAB — TSH: TSH: 1.293 u[IU]/mL (ref 0.350–4.500)

## 2017-11-12 MED ORDER — SODIUM CHLORIDE 0.9 % IV SOLN
240.0000 mg | Freq: Once | INTRAVENOUS | Status: AC
  Administered 2017-11-12: 240 mg via INTRAVENOUS
  Filled 2017-11-12: qty 24

## 2017-11-12 MED ORDER — SODIUM CHLORIDE 0.9 % IV SOLN
INTRAVENOUS | Status: DC
  Administered 2017-11-12: 12:00:00 via INTRAVENOUS
  Filled 2017-11-12: qty 1000

## 2017-11-12 MED ORDER — BENZONATATE 200 MG PO CAPS: 200.0000 mg | ORAL_CAPSULE | Freq: Three times a day (TID) | ORAL | 0 refills | Status: DC | PRN

## 2017-11-12 MED ORDER — MORPHINE SULFATE ER 30 MG PO TBCR: 30.0000 mg | EXTENDED_RELEASE_TABLET | Freq: Two times a day (BID) | ORAL | 0 refills | Status: DC

## 2017-11-12 MED ORDER — ALBUTEROL SULFATE HFA 108 (90 BASE) MCG/ACT IN AERS: 2.0000 | INHALATION_SPRAY | Freq: Four times a day (QID) | RESPIRATORY_TRACT | 2 refills | Status: DC | PRN

## 2017-11-12 MED ORDER — OXYCODONE-ACETAMINOPHEN 10-325 MG PO TABS: 1.0000 | ORAL_TABLET | Freq: Two times a day (BID) | ORAL | 0 refills | Status: DC | PRN

## 2017-11-12 NOTE — Progress Notes (Signed)
Patient here for follow up with labs and treatment with Opdivo today. He states that he continues to have pain, but it is stable at this time.

## 2017-11-13 ENCOUNTER — Encounter: Payer: Self-pay | Admitting: Oncology

## 2017-11-13 MED ORDER — AZITHROMYCIN 250 MG PO TABS: ORAL_TABLET | ORAL | 0 refills | Status: DC

## 2017-11-13 MED ORDER — PREDNISONE 10 MG (21) PO TBPK: ORAL_TABLET | Freq: Every day | ORAL | 0 refills | Status: DC

## 2017-11-16 NOTE — Progress Notes (Signed)
[facility] follow up visit  Date of visit: 11/16/17 REASON FOR VISIT Follow up for treatment of metastatic renal cell cancer.  HISTORY OF PRESENT DISEASE/ PERTINENT ONCOLOGY HISTORY 1[name] is  56 y.o. male with 40 pack year smoking histroy is here for evaluation of abnormal image finding. Patient has had right shoulder pain  three months after reaching below a bar to grab a glass. Patient is not able to raise his right arm over shoulder level.  Also having left flank pain associated with pink urine for a few days and resolved after taking a course of amoxicillin. Patient has 40 pack year smoking history.    2 Images:  06/26/2017 CT scan showed a concerning right kidney mass with possible metastatic disease with thoracic and abdominal lymphadenopathy, lung nodules, and diffuse osseous metastases. 07/15/2017 CT PE protocol for evaluation of shortness of breath or chest pain showed no PE, however right lung collapse and rapid progression of lung adenopathy.  08/10/2017 ED visit due to uncontrolled pain: CT hip showed lucent bone lesions in the left hip and left acetabulum consistent with known metastatic disease. No evidence of pathological fracture.  08/11/2017 Bone scan: FINDINGS:Numerous areas of abnormal bony uptake noted in the skull, bilateral scapulae, sternum, cervical and thoracic spine, posterior left tenth rib, and right iliac bone compatible with metastatic disease. Degenerative uptake in the ankles and feet. Soft tissue activity unremarkable. IMPRESSION:Extensive osseous metastatic disease as above.   3 Pathology:     07/10/2017 07/24/2017 A. Lung, right upper lobe, endobronchial biopsy DIAGNOSIS: A. LUNG, RIGHT UPPER LOBE; ENDOBRONCHIAL BIOPSY: - METASTATIC CARCINOMA CONSISTENT WITH CLEAR CELL RENAL CELL CARCINOMA    Bronchoscopy on 07/24/2017 and biopsy revealed metastatic renal cell cancer.   4 Treatment 07/22/2017. nivolumab and ipilimumab, s/p 4 cycles.  S/p  palliative RT to lung and involved bone mets     5 Pain medication:  Patient finished one month supply of percocet in about one week. So his pain medication Rx are being dispensed weekly and he gets one week supply at one time. Multiple discussion about importance of taking pain medication as instructed.  During one clinic visit, patient was on the phone talking to someone and RN overheard that he said "I am right now at [place] office for the RX, get the money ready".    INTERVAL HISTORY  Larry Watkins 56 y.o.  male with above history who presents for reevaluation of cough. Patient had Chest x-ray done last week which showed worsening right upper lobe and middle lobe pneumonia. Patient was prescribed albuterol inhaler PRN , Dosepak, a course of 5 days of azithromycin, Tessalon. Patient presents today for follow-up. Patient reports coughing has not improved, persistently waking him up during the night. Cough is productive with yellowish sputum. Denies any fever or chills. Patient reports feeling persistent right hip pain radiating to the groin. He takes pain medications as instructed.   Review of Systems  Constitutional: Negative for appetite change, chills, fever and unexpected weight change.  Eyes: Negative for blurred vision.  Respiratory: Positive for cough and sputum production. Negative for hemoptysis, chest tightness and shortness of breath.   Cardiovascular: Negative for chest pain.  Gastrointestinal: Negative for abdominal distention, abdominal pain and diarrhea.       Right lower quadrant  Genitourinary: Negative for difficulty urinating, dysuria and flank pain.  Musculoskeletal: Positive for arthralgias and joint pain. Negative for back pain, gait problem and neck pain.  Right hip pain.  Skin: Negative for itching and rash.  Neurological: Negative for dizziness.  Endo/Heme/Allergies: Negative for adenopathy. Does not bruise/bleed easily.  Psychiatric/Behavioral: Negative  for confusion. The patient is not nervous/anxious.      MEDICAL HISTORY: Past Medical History:  Diagnosis Date  . Bone metastasis ([place]) 07/17/2017  . Bone metastasis ([place]) 07/17/2017  . Metastatic renal cell carcinoma ([place])     SURGICAL HISTORY: Past Surgical History:  Procedure Laterality Date  . FLEXIBLE BRONCHOSCOPY N/A 07/24/2017   Procedure: FLEXIBLE BRONCHOSCOPY;  Surgeon: [name], MD;  Location: [facility] ORS;  Service: Pulmonary;  Laterality: N/A;  . HERNIA REPAIR    . left middle amputation      with reattachment    SOCIAL HISTORY: Social History   Socioeconomic History  . Marital status: Single    Spouse name: Not on file  . Number of children: Not on file  . Years of education: Not on file  . Highest education level: Not on file  Social Needs  . Financial resource strain: Not on file  . Food insecurity - worry: Not on file  . Food insecurity - inability: Not on file  . Transportation needs - medical: Not on file  . Transportation needs - non-medical: Not on file  Occupational History  . Not on file  Tobacco Use  . Smoking status: Current Every Day Smoker    Packs/day: 0.25    Years: 38.00    Pack years: 9.50  . Smokeless tobacco: Never Used  Substance and Sexual Activity  . Alcohol use: No  . Drug use: No  . Sexual activity: Not on file  Other Topics Concern  . Not on file  Social History Narrative  . Not on file    FAMILY HISTORY Family History  Problem Relation Age of Onset  . Cancer Paternal Uncle        brain    ALLERGIES:  has No Known Allergies.  MEDICATIONS:  Current Outpatient Medications  Medication Sig Dispense Refill  . albuterol (PROVENTIL HFA;VENTOLIN HFA) 108 (90 Base) MCG/ACT inhaler Inhale 2 puffs into the lungs every 6 (six) hours as needed for wheezing or shortness of breath. 1 Inhaler 2  . azithromycin (ZITHROMAX) 250 MG tablet Take 2 tabs on day 1, then 1 tab daily for 4 days. 6 each 0  . benzonatate (TESSALON) 200  MG capsule Take 1 capsule (200 mg total) by mouth 3 (three) times daily as needed for cough. 30 capsule 0  . gabapentin (NEURONTIN) 300 MG capsule Take 1 capsule (300 mg total) by mouth 3 (three) times daily. (Patient taking differently: Take 600 mg by mouth 2 (two) times daily. ) 90 capsule 0  . morphine (MS CONTIN) 30 MG 12 hr tablet Take 1 tablet (30 mg total) by mouth every 12 (twelve) hours for 14 days. 28 tablet 0  . ondansetron (ZOFRAN ODT) 4 MG disintegrating tablet Take 1 tablet (4 mg total) every 8 (eight) hours as needed by mouth for nausea or vomiting. 20 tablet 0  . oxyCODONE-acetaminophen (PERCOCET) 10-325 MG tablet Take 1 tablet by mouth every 12 (twelve) hours as needed for up to 14 days for pain. 28 tablet 0  . predniSONE (STERAPRED UNI-PAK 21 TAB) 10 MG (21) TBPK tablet Take by mouth daily. 21 tablet 0   No current facility-administered medications for this visit.    Vitals:   11/17/17 0922  BP: 111/75  Pulse: 77  Temp: (!) 97.2 F (36.2 C)  SpO2: 98%   Filed Weights   11/17/17 8099  Weight: 219 lb 2 oz (99.4 kg)   Physical Exam  Constitutional: He is oriented to person, place, and time. He appears well-developed. No distress.  HENT:  Head: Normocephalic and atraumatic.  Mouth/Throat: No oropharyngeal exudate.  Eyes: EOM are normal. Pupils are equal, round, and reactive to light. No scleral icterus.  Neck: Neck supple. No JVD present. No tracheal deviation present.  Cardiovascular: Normal rate, regular rhythm and normal heart sounds.  No murmur heard. Pulmonary/Chest: Effort normal. No stridor. No respiratory distress. He has wheezes.  Abdominal: Soft. Bowel sounds are normal. He exhibits no distension. There is no tenderness. There is no guarding.  Musculoskeletal: Normal range of motion. He exhibits no tenderness or deformity.  Lymphadenopathy:    He has no cervical adenopathy.  Neurological: He is alert and oriented to person, place, and time. No cranial nerve  deficit. Coordination normal.  Skin: Skin is warm and dry. No rash noted. No erythema.  Psychiatric: He has a normal mood and affect.    LABORATORY DATA: I have personally reviewed the data as listed: CBC    Component Value Date/Time   WBC 5.5 11/12/2017 1105   RBC 4.79 11/12/2017 1105   HGB 13.8 11/12/2017 1105   HCT 41.6 11/12/2017 1105   PLT 315 11/12/2017 1105   MCV 86.9 11/12/2017 1105   MCH 28.7 11/12/2017 1105   MCHC 33.0 11/12/2017 1105   RDW 14.6 (H) 11/12/2017 1105   LYMPHSABS 1.5 11/12/2017 1105   MONOABS 0.6 11/12/2017 1105   EOSABS 0.1 11/12/2017 1105   BASOSABS 0.0 11/12/2017 1105   CMP Latest Ref Rng & Units 11/12/2017 10/29/2017 10/15/2017  Glucose 65 - 99 mg/dL 101(H) 99 150(H)  BUN 6 - 20 mg/dL _0 Creatinine 0.61 - 1.24 mg/dL 0.85 0.87 0.88  Sodium 135 - 145 mmol/L 137 138 138  Potassium 3.5 - 5.1 mmol/L 4.7 4.5 3.9  Chloride 101 - 111 mmol/L 102 102 105  CO2 22 - 32 mmol/L _1 Calcium 8.9 - 10.3 mg/dL 9.3 9.0 8.9  Total Protein 6.5 - 8.1 g/dL 7.7 7.5 7.2  Total Bilirubin 0.3 - 1.2 mg/dL 0.3 0.3 0.3  Alkaline Phos 38 - 126 U/L 101 101 118  AST 15 - 41 U/L 15 13(L) 21  ALT 17 - 63 U/L 19 15(L) 19    RADIOGRAPHIC STUDIES: I have personally reviewed the radiological images as listed and agree with the findings in the report CT chest abdomen pelvis 06/26/2017  IMPRESSION: 1. 7 cm right lower pole renal mass. This may reflect urothelial carcinoma given involvement of the collecting system versus renal cell carcinoma. 2. Metastatic disease with thoracic and abdominal lymphadenopathy, lung nodules, and diffuse osseous metastases. 3. Subcentimeter lesion in the right upper lobe bronchus with diffuse abnormal right upper lobe bronchial wall soft tissue thickening.  07/15/2017 No evidence of pulmonary emboli. Attenuation of the pulmonary arterial branches is noted related to hilar adenopathy. Increase in the size of the central right hilar  mass with increase in mediastinal and hilar adenopathy when compared with the recent exam. New right upper lobe collapse secondary to bronchial occlusion.Additionally some patchy infiltrative changes noted in the lower lobe likely related to the acute infiltrate. Diffuse bony metastatic disease with evidence of pathologic fracture involving the left tenth rib lesion.  Pathology 07/10/2017 Surgical Pathology  CASE: [id number]  PATIENT: Kham Dai  Surgical Pathology Report  DIAGNOSIS:  A.  LYTIC BONE LESION, RIGHT ILIAC CREST; CT-GUIDED BIOPSY:  - METASTATIC CARCINOMA, PAX-8 POSITIVE, COMPATIBLE WITH METASTATIC CLEAR  CELL RENAL CELL CARCINOMA.    Surgical Pathology 07/24/2017 CASE: [id number]  SPECIMEN SUBMITTED:  A. Lung, right upper lobe, endobronchial biopsy  DIAGNOSIS:  A. LUNG, RIGHT UPPER LOBE; ENDOBRONCHIAL BIOPSY:  - METASTATIC CARCINOMA CONSISTENT WITH CLEAR CELL RENAL CELL CARCINOMA  07/24/2017 Cytology - Non PAP  CASE: [id number]  SPECIMEN SUBMITTED:  A. Lung, right upper lobe; bronchoscopy brushing  DIAGNOSIS:  A. LUNG, RIGHT UPPER LOBE; BRONCHOSCOPY BRUSHING:  - POSITIVE FOR MALIGNANCY.  - METASTATIC CARCINOMA CONSISTENT WITH CLEAR CELL RENAL CELL CARCINOMA  ASSESSMENT/PLAN Cancer Staging Renal cell carcinoma of right kidney [facility]) Staging form: Kidney, AJCC 8th Edition - Clinical stage from 07/14/2017: Stage IV (cT3a, cNX, pM1) - Signed by Earlie Server, MD on 07/15/2017 - Pathologic: No stage assigned - Unsigned  1. Renal cell carcinoma of right kidney ([place])   2. Bone metastasis ([place])   3. Renal cell carcinoma of right kidney metastatic to other site ([place])   4. Cough   5. Pneumonia of right lung due to infectious organism, unspecified part of lung    # Chest x-ray showed worsening right upper lobe and middle lobe pneumonia, symptoms persist after antibiotics course. We will obtain CT scan for further characterization. Patient is on immunotherapy, pneumonitis  need to be ruled out.  # Persistent right hip pain, obtain MRI right hip to further characterize if there are any pathologic fractures or neoplasm and related problems.   Follow in 1 week prior to next treatment  All questions were answered. The patient knows to call the clinic with any problems, questions or concerns.Earlie Server, MD  11/16/2017 7:54 PM

## 2017-11-17 ENCOUNTER — Encounter: Payer: Self-pay | Admitting: Oncology

## 2017-11-17 ENCOUNTER — Other Ambulatory Visit: Payer: Self-pay

## 2017-11-17 ENCOUNTER — Inpatient Hospital Stay (HOSPITAL_BASED_OUTPATIENT_CLINIC_OR_DEPARTMENT_OTHER): Payer: Medicaid Other | Admitting: Oncology

## 2017-11-17 VITALS — BP 111/75 | HR 77 | Temp 97.2°F | Wt 219.1 lb

## 2017-11-17 DIAGNOSIS — J189 Pneumonia, unspecified organism: Secondary | ICD-10-CM | POA: Diagnosis not present

## 2017-11-17 DIAGNOSIS — R059 Cough, unspecified: Secondary | ICD-10-CM

## 2017-11-17 DIAGNOSIS — C641 Malignant neoplasm of right kidney, except renal pelvis: Secondary | ICD-10-CM | POA: Diagnosis not present

## 2017-11-17 DIAGNOSIS — Z923 Personal history of irradiation: Secondary | ICD-10-CM | POA: Diagnosis not present

## 2017-11-17 DIAGNOSIS — C7951 Secondary malignant neoplasm of bone: Secondary | ICD-10-CM | POA: Diagnosis not present

## 2017-11-17 DIAGNOSIS — R05 Cough: Secondary | ICD-10-CM | POA: Diagnosis not present

## 2017-11-17 DIAGNOSIS — Z5112 Encounter for antineoplastic immunotherapy: Secondary | ICD-10-CM | POA: Diagnosis not present

## 2017-11-17 DIAGNOSIS — F17210 Nicotine dependence, cigarettes, uncomplicated: Secondary | ICD-10-CM | POA: Diagnosis not present

## 2017-11-17 DIAGNOSIS — C799 Secondary malignant neoplasm of unspecified site: Secondary | ICD-10-CM

## 2017-11-17 NOTE — Progress Notes (Signed)
Patient here today for follow up.   

## 2017-11-19 ENCOUNTER — Ambulatory Visit: Admission: RE | Admit: 2017-11-19 | Payer: Medicaid Other | Source: Ambulatory Visit

## 2017-11-19 ENCOUNTER — Telehealth: Payer: Self-pay | Admitting: *Deleted

## 2017-11-19 NOTE — Telephone Encounter (Signed)
Called Patient he is aware of wait time on 11/26/17 For his Infusion. He was ok with it.

## 2017-11-22 ENCOUNTER — Ambulatory Visit: Payer: Medicaid Other

## 2017-11-25 NOTE — Progress Notes (Signed)
[facility] follow up visit  Date of visit: 11/25/17 REASON FOR VISIT Follow up for treatment of metastatic renal cell cancer.  HISTORY OF PRESENT DISEASE/ PERTINENT ONCOLOGY HISTORY 1[name] is  56 y.o. male with 40 pack year smoking histroy is here for evaluation of abnormal image finding. Patient has had right shoulder pain  three months after reaching below a bar to grab a glass. Patient is not able to raise his right arm over shoulder level.  Also having left flank pain associated with pink urine for a few days and resolved after taking a course of amoxicillin. Patient has 40 pack year smoking history.    2 Images:  06/26/2017 CT scan showed a concerning right kidney mass with possible metastatic disease with thoracic and abdominal lymphadenopathy, lung nodules, and diffuse osseous metastases. 07/15/2017 CT PE protocol for evaluation of shortness of breath or chest pain showed no PE, however right lung collapse and rapid progression of lung adenopathy.  08/10/2017 ED visit due to uncontrolled pain: CT hip showed lucent bone lesions in the left hip and left acetabulum consistent with known metastatic disease. No evidence of pathological fracture.  08/11/2017 Bone scan: FINDINGS:Numerous areas of abnormal bony uptake noted in the skull, bilateral scapulae, sternum, cervical and thoracic spine, posterior left tenth rib, and right iliac bone compatible with metastatic disease. Degenerative uptake in the ankles and feet. Soft tissue activity unremarkable. IMPRESSION:Extensive osseous metastatic disease as above.   3 Pathology:     07/10/2017 07/24/2017 A. Lung, right upper lobe, endobronchial biopsy DIAGNOSIS: A. LUNG, RIGHT UPPER LOBE; ENDOBRONCHIAL BIOPSY: - METASTATIC CARCINOMA CONSISTENT WITH CLEAR CELL RENAL CELL CARCINOMA    Bronchoscopy on 07/24/2017 and biopsy revealed metastatic renal cell cancer.   4 Treatment 07/22/2017. nivolumab and ipilimumab, s/p 4 cycles.  S/p  palliative RT to lung and involved bone mets     5 Pain medication:  Patient finished one month supply of percocet in about one week. So his pain medication Rx are being dispensed weekly and he gets one week supply at one time. Multiple discussion about importance of taking pain medication as instructed.  During one clinic visit, patient was on the phone talking to someone and RN overheard that he said "I am right now at [place] office for the RX, get the money ready".   #Patient had chest x-ray done 2 weeks ago which showed worsening right upper lobe middle lobe pneumonia, he was prescribed albuterol inhaler when necessary. As well as Dosepak and also a course of 5 days of azithromycin. Tessalon.  INTERVAL HISTORY  Larry Watkins 56 y.o.  male with above history who presents for reevaluation of cough.  Patient reports his coughing is much better improved. He also finished his antibiotic course and steroid tapering. He has gained a couple of pounds. Eating well. He continues to complain of pain of the right hip, persistently waking him up during the night. He reports he is taking pain medication as needed and as instructed.  Review of Systems  Constitutional: Negative for chills, fever and malaise/fatigue.  HENT: Negative for tinnitus.   Eyes: Negative for blurred vision and pain.  Respiratory: Positive for cough. Negative for hemoptysis, sputum production and shortness of breath.   Cardiovascular: Negative for chest pain.  Gastrointestinal: Negative for abdominal pain, diarrhea and nausea.       Right lower quadrant  Genitourinary: Negative for dysuria and flank pain.  Musculoskeletal: Positive for joint pain. Negative for back pain  and neck pain.       Right hip pain.  Skin: Negative for itching and rash.  Neurological: Negative for dizziness and tingling.  Endo/Heme/Allergies: Does not bruise/bleed easily.  Psychiatric/Behavioral: Negative for suicidal ideas. The patient is not  nervous/anxious.      MEDICAL HISTORY: Past Medical History:  Diagnosis Date  . Bone metastasis ([place]) 07/17/2017  . Bone metastasis ([place]) 07/17/2017  . Metastatic renal cell carcinoma ([place])     SURGICAL HISTORY: Past Surgical History:  Procedure Laterality Date  . FLEXIBLE BRONCHOSCOPY N/A 07/24/2017   Procedure: FLEXIBLE BRONCHOSCOPY;  Surgeon: [name], MD;  Location: [facility] ORS;  Service: Pulmonary;  Laterality: N/A;  . HERNIA REPAIR    . left middle amputation      with reattachment    SOCIAL HISTORY: Social History   Socioeconomic History  . Marital status: Single    Spouse name: Not on file  . Number of children: Not on file  . Years of education: Not on file  . Highest education level: Not on file  Social Needs  . Financial resource strain: Not on file  . Food insecurity - worry: Not on file  . Food insecurity - inability: Not on file  . Transportation needs - medical: Not on file  . Transportation needs - non-medical: Not on file  Occupational History  . Not on file  Tobacco Use  . Smoking status: Current Every Day Smoker    Packs/day: 0.25    Years: 38.00    Pack years: 9.50  . Smokeless tobacco: Never Used  Substance and Sexual Activity  . Alcohol use: No  . Drug use: No  . Sexual activity: Not on file  Other Topics Concern  . Not on file  Social History Narrative  . Not on file    FAMILY HISTORY Family History  Problem Relation Age of Onset  . Cancer Paternal Uncle        brain    ALLERGIES:  has No Known Allergies.  MEDICATIONS:  Current Outpatient Medications  Medication Sig Dispense Refill  . gabapentin (NEURONTIN) 300 MG capsule Take 1 capsule (300 mg total) by mouth 3 (three) times daily. (Patient taking differently: Take 600 mg by mouth 2 (two) times daily. ) 90 capsule 0  . morphine (MS CONTIN) 30 MG 12 hr tablet Take 1 tablet (30 mg total) by mouth every 12 (twelve) hours for 14 days. 28 tablet 0  . ondansetron (ZOFRAN ODT) 4  MG disintegrating tablet Take 1 tablet (4 mg total) by mouth every 8 (eight) hours as needed for nausea or vomiting. 20 tablet 0  . oxyCODONE-acetaminophen (PERCOCET) 10-325 MG tablet Take 1 tablet by mouth every 12 (twelve) hours as needed for up to 14 days for pain. 28 tablet 0  . albuterol (PROVENTIL HFA;VENTOLIN HFA) 108 (90 Base) MCG/ACT inhaler Inhale 2 puffs into the lungs every 6 (six) hours as needed for wheezing or shortness of breath. (Patient not taking: Reported on 11/26/2017) 1 Inhaler 2  . benzonatate (TESSALON) 200 MG capsule Take 1 capsule (200 mg total) by mouth 3 (three) times daily as needed for cough. (Patient not taking: Reported on 11/26/2017) 30 capsule 0   No current facility-administered medications for this visit.    There were no vitals filed for this visit. There were no vitals filed for this visit. Physical Exam  Constitutional: He is oriented to person, place, and time. He appears well-developed. No distress.  HENT:  Head: Normocephalic and atraumatic.  Mouth/Throat: No oropharyngeal exudate.  Eyes: EOM are normal. Pupils are equal, round, and reactive to light. Left eye exhibits no discharge. No scleral icterus.  Neck: Neck supple. No JVD present. No tracheal deviation present. No thyromegaly present.  Cardiovascular: Normal rate, regular rhythm and normal heart sounds. Exam reveals no friction rub.  No murmur heard. Pulmonary/Chest: Effort normal. No stridor. No respiratory distress. He has no wheezes. He has no rales.  Abdominal: Soft. Bowel sounds are normal. He exhibits no distension and no mass. There is no tenderness. There is no guarding.  Musculoskeletal: Normal range of motion. He exhibits no edema, tenderness or deformity.  Lymphadenopathy:    He has no cervical adenopathy.  Neurological: He is alert and oriented to person, place, and time. He displays normal reflexes. No cranial nerve deficit. Coordination normal.  Skin: Skin is warm and dry. No rash  noted. No erythema. No pallor.  Psychiatric: He has a normal mood and affect.    LABORATORY DATA: I have personally reviewed the data as listed: CBC    Component Value Date/Time   WBC 5.5 11/12/2017 1105   RBC 4.79 11/12/2017 1105   HGB 13.8 11/12/2017 1105   HCT 41.6 11/12/2017 1105   PLT 315 11/12/2017 1105   MCV 86.9 11/12/2017 1105   MCH 28.7 11/12/2017 1105   MCHC 33.0 11/12/2017 1105   RDW 14.6 (H) 11/12/2017 1105   LYMPHSABS 1.5 11/12/2017 1105   MONOABS 0.6 11/12/2017 1105   EOSABS 0.1 11/12/2017 1105   BASOSABS 0.0 11/12/2017 1105   CMP Latest Ref Rng & Units 11/12/2017 10/29/2017 10/15/2017  Glucose 65 - 99 mg/dL 101(H) 99 150(H)  BUN 6 - 20 mg/dL _0 Creatinine 0.61 - 1.24 mg/dL 0.85 0.87 0.88  Sodium 135 - 145 mmol/L 137 138 138  Potassium 3.5 - 5.1 mmol/L 4.7 4.5 3.9  Chloride 101 - 111 mmol/L 102 102 105  CO2 22 - 32 mmol/L _1 Calcium 8.9 - 10.3 mg/dL 9.3 9.0 8.9  Total Protein 6.5 - 8.1 g/dL 7.7 7.5 7.2  Total Bilirubin 0.3 - 1.2 mg/dL 0.3 0.3 0.3  Alkaline Phos 38 - 126 U/L 101 101 118  AST 15 - 41 U/L 15 13(L) 21  ALT 17 - 63 U/L 19 15(L) 19    RADIOGRAPHIC STUDIES: I have personally reviewed the radiological images as listed and agree with the findings in the report CT chest abdomen pelvis 06/26/2017  IMPRESSION: 1. 7 cm right lower pole renal mass. This may reflect urothelial carcinoma given involvement of the collecting system versus renal cell carcinoma. 2. Metastatic disease with thoracic and abdominal lymphadenopathy, lung nodules, and diffuse osseous metastases. 3. Subcentimeter lesion in the right upper lobe bronchus with diffuse abnormal right upper lobe bronchial wall soft tissue thickening.  07/15/2017 No evidence of pulmonary emboli. Attenuation of the pulmonary arterial branches is noted related to hilar adenopathy. Increase in the size of the central right hilar mass with increase in mediastinal and hilar adenopathy  when compared with the recent exam. New right upper lobe collapse secondary to bronchial occlusion.Additionally some patchy infiltrative changes noted in the lower lobe likely related to the acute infiltrate. Diffuse bony metastatic disease with evidence of pathologic fracture involving the left tenth rib lesion.  Pathology 07/10/2017 Surgical Pathology  CASE: [id number]  PATIENT: Dontavious Nappi  Surgical Pathology Report  DIAGNOSIS:  A. LYTIC BONE LESION, RIGHT ILIAC CREST; CT-GUIDED BIOPSY:  - METASTATIC CARCINOMA, PAX-8 POSITIVE, COMPATIBLE WITH METASTATIC  CLEAR  CELL RENAL CELL CARCINOMA.    Surgical Pathology 07/24/2017 CASE: [id number]  SPECIMEN SUBMITTED:  A. Lung, right upper lobe, endobronchial biopsy  DIAGNOSIS:  A. LUNG, RIGHT UPPER LOBE; ENDOBRONCHIAL BIOPSY:  - METASTATIC CARCINOMA CONSISTENT WITH CLEAR CELL RENAL CELL CARCINOMA  07/24/2017 Cytology - Non PAP  CASE: [id number]  SPECIMEN SUBMITTED:  A. Lung, right upper lobe; bronchoscopy brushing  DIAGNOSIS:  A. LUNG, RIGHT UPPER LOBE; BRONCHOSCOPY BRUSHING:  - POSITIVE FOR MALIGNANCY.  - METASTATIC CARCINOMA CONSISTENT WITH CLEAR CELL RENAL CELL CARCINOMA  ASSESSMENT/PLAN Cancer Staging Renal cell carcinoma of right kidney [facility]) Staging form: Kidney, AJCC 8th Edition - Clinical stage from 07/14/2017: Stage IV (cT3a, cNX, pM1) - Signed by Earlie Server, MD on 07/15/2017 - Pathologic: No stage assigned - Unsigned  1. Renal cell carcinoma of right kidney ([place])   2. Bone metastasis ([place])   3. Pneumonia of right lung due to infectious organism, unspecified part of lung    #Clinically his pneumonia has improved. Cough has been much better, however still having intermittent dry cough.  # Continue every 2 weeks optimal treatment today. He has CT chest scheduled on 1/3 # Persistent right hip pain, prescribed MRI right hip to further characterize if there are any pathologic fractures or neoplasm and related problems.  Insurance denied.   Follow in 1 week prior to next treatment  All questions were answered. The patient knows to call the clinic with any problems, questions or concerns.Earlie Server, MD  11/25/2017 9:54 PM

## 2017-11-26 ENCOUNTER — Other Ambulatory Visit: Payer: Self-pay | Admitting: *Deleted

## 2017-11-26 ENCOUNTER — Encounter: Payer: Self-pay | Admitting: Oncology

## 2017-11-26 ENCOUNTER — Telehealth: Payer: Self-pay | Admitting: *Deleted

## 2017-11-26 ENCOUNTER — Inpatient Hospital Stay: Payer: Medicaid Other

## 2017-11-26 ENCOUNTER — Other Ambulatory Visit: Payer: Self-pay

## 2017-11-26 ENCOUNTER — Inpatient Hospital Stay (HOSPITAL_BASED_OUTPATIENT_CLINIC_OR_DEPARTMENT_OTHER): Payer: Medicaid Other | Admitting: Oncology

## 2017-11-26 VITALS — BP 130/78 | HR 78 | Temp 96.9°F | Resp 20 | Wt 221.6 lb

## 2017-11-26 DIAGNOSIS — Z923 Personal history of irradiation: Secondary | ICD-10-CM | POA: Diagnosis not present

## 2017-11-26 DIAGNOSIS — C641 Malignant neoplasm of right kidney, except renal pelvis: Secondary | ICD-10-CM | POA: Diagnosis not present

## 2017-11-26 DIAGNOSIS — F17210 Nicotine dependence, cigarettes, uncomplicated: Secondary | ICD-10-CM

## 2017-11-26 DIAGNOSIS — R05 Cough: Secondary | ICD-10-CM

## 2017-11-26 DIAGNOSIS — C649 Malignant neoplasm of unspecified kidney, except renal pelvis: Secondary | ICD-10-CM

## 2017-11-26 DIAGNOSIS — C7951 Secondary malignant neoplasm of bone: Secondary | ICD-10-CM

## 2017-11-26 DIAGNOSIS — J189 Pneumonia, unspecified organism: Secondary | ICD-10-CM

## 2017-11-26 DIAGNOSIS — Z5112 Encounter for antineoplastic immunotherapy: Secondary | ICD-10-CM | POA: Diagnosis not present

## 2017-11-26 LAB — COMPREHENSIVE METABOLIC PANEL
ALBUMIN: 3.4 g/dL — AB (ref 3.5–5.0)
ALK PHOS: 96 U/L (ref 38–126)
ALT: 17 U/L (ref 17–63)
AST: 16 U/L (ref 15–41)
Anion gap: 8 (ref 5–15)
BUN: 15 mg/dL (ref 6–20)
CALCIUM: 8.9 mg/dL (ref 8.9–10.3)
CO2: 29 mmol/L (ref 22–32)
CREATININE: 0.88 mg/dL (ref 0.61–1.24)
Chloride: 100 mmol/L — ABNORMAL LOW (ref 101–111)
GFR calc Af Amer: >60 mL/min (ref >60)
GFR calc non Af Amer: >60 mL/min (ref >60)
GLUCOSE: 110 mg/dL — AB (ref 65–99)
Potassium: 4.3 mmol/L (ref 3.5–5.1)
SODIUM: 137 mmol/L (ref 135–145)
Total Bilirubin: 0.2 mg/dL — ABNORMAL LOW (ref 0.3–1.2)
Total Protein: 7.5 g/dL (ref 6.5–8.1)

## 2017-11-26 LAB — CBC WITH DIFFERENTIAL/PLATELET
BASOS PCT: 1 %
Basophils Absolute: 0 10*3/uL (ref 0–0.1)
EOS ABS: 0.1 10*3/uL (ref 0–0.7)
Eosinophils Relative: 2 %
HEMATOCRIT: 39.2 % — AB (ref 40.0–52.0)
Hemoglobin: 13.1 g/dL (ref 13.0–18.0)
Lymphocytes Relative: 24 %
Lymphs Abs: 1.2 10*3/uL (ref 1.0–3.6)
MCH: 28.8 pg (ref 26.0–34.0)
MCHC: 33.3 g/dL (ref 32.0–36.0)
MCV: 86.4 fL (ref 80.0–100.0)
MONO ABS: 0.5 10*3/uL (ref 0.2–1.0)
MONOS PCT: 10 %
Neutro Abs: 3.1 10*3/uL (ref 1.4–6.5)
Neutrophils Relative %: 63 %
Platelets: 292 10*3/uL (ref 150–440)
RBC: 4.53 MIL/uL (ref 4.40–5.90)
RDW: 14.2 % (ref 11.5–14.5)
WBC: 4.9 10*3/uL (ref 3.8–10.6)

## 2017-11-26 LAB — TSH: TSH: 1.257 u[IU]/mL (ref 0.350–4.500)

## 2017-11-26 MED ORDER — ONDANSETRON 4 MG PO TBDP: 4.0000 mg | ORAL_TABLET | Freq: Three times a day (TID) | ORAL | 0 refills | Status: DC | PRN

## 2017-11-26 MED ORDER — MORPHINE SULFATE ER 30 MG PO TBCR: 30.0000 mg | EXTENDED_RELEASE_TABLET | Freq: Two times a day (BID) | ORAL | 0 refills | Status: DC

## 2017-11-26 MED ORDER — OXYCODONE-ACETAMINOPHEN 10-325 MG PO TABS: 1.0000 | ORAL_TABLET | Freq: Two times a day (BID) | ORAL | 0 refills | Status: DC | PRN

## 2017-11-26 NOTE — Progress Notes (Signed)
Here for follow up. Feeling better. Needs zofran percocet and morphine-renewals he stated

## 2017-11-26 NOTE — Telephone Encounter (Signed)
Patient was called on 11/19/17 and was made aware of Infusion wait time due MD schedule,  he stated that it was fine and he could leave for lunch and come back. However, today he stated that he couldn't stay. Due to the fact that he was using  His mom car and he needed to leave. He asked if he could come the following  day for his Infusion because he had to leave. Per Almyra Free and Rexanne Mano to reschedule his appt. If there  Was space in Infusion, space was available in Chair 11 @ 2:00 He is aware of his Infusion time for 11/27/17.

## 2017-11-26 NOTE — Telephone Encounter (Signed)
Patient called to report that he is coming in today and will need his medications refilled

## 2017-11-26 NOTE — Telephone Encounter (Signed)
Message given to Dr Tasia Catchings

## 2017-11-27 ENCOUNTER — Inpatient Hospital Stay: Payer: Medicaid Other

## 2017-11-27 DIAGNOSIS — C799 Secondary malignant neoplasm of unspecified site: Secondary | ICD-10-CM

## 2017-11-27 DIAGNOSIS — Z5112 Encounter for antineoplastic immunotherapy: Secondary | ICD-10-CM | POA: Diagnosis not present

## 2017-11-27 DIAGNOSIS — C649 Malignant neoplasm of unspecified kidney, except renal pelvis: Secondary | ICD-10-CM

## 2017-11-27 DIAGNOSIS — C7951 Secondary malignant neoplasm of bone: Secondary | ICD-10-CM

## 2017-11-27 DIAGNOSIS — C641 Malignant neoplasm of right kidney, except renal pelvis: Secondary | ICD-10-CM

## 2017-11-27 MED ORDER — SODIUM CHLORIDE 0.9 % IV SOLN
240.0000 mg | Freq: Once | INTRAVENOUS | Status: AC
  Administered 2017-11-27: 240 mg via INTRAVENOUS
  Filled 2017-11-27: qty 24

## 2017-11-27 MED ORDER — SODIUM CHLORIDE 0.9 % IV SOLN
INTRAVENOUS | Status: DC
  Administered 2017-11-27: 14:00:00 via INTRAVENOUS
  Filled 2017-11-27: qty 1000

## 2017-11-27 MED ORDER — DENOSUMAB 120 MG/1.7ML ~~LOC~~ SOLN
120.0000 mg | Freq: Once | SUBCUTANEOUS | Status: AC
  Administered 2017-11-27: 120 mg via SUBCUTANEOUS
  Filled 2017-11-27: qty 1.7

## 2017-11-29 ENCOUNTER — Ambulatory Visit: Admission: RE | Admit: 2017-11-29 | Payer: Medicaid Other | Source: Ambulatory Visit

## 2017-12-03 ENCOUNTER — Other Ambulatory Visit: Payer: Self-pay | Admitting: *Deleted

## 2017-12-04 ENCOUNTER — Ambulatory Visit
Admission: RE | Admit: 2017-12-04 | Discharge: 2017-12-04 | Disposition: A | Discharge status: Home / Self Care | Payer: Medicaid Other | Source: Ambulatory Visit | Attending: Oncology | Admitting: Oncology

## 2017-12-04 DIAGNOSIS — I2510 Atherosclerotic heart disease of native coronary artery without angina pectoris: Secondary | ICD-10-CM

## 2017-12-04 DIAGNOSIS — R05 Cough: Secondary | ICD-10-CM

## 2017-12-04 DIAGNOSIS — R591 Generalized enlarged lymph nodes: Secondary | ICD-10-CM

## 2017-12-04 DIAGNOSIS — C801 Malignant (primary) neoplasm, unspecified: Secondary | ICD-10-CM

## 2017-12-04 DIAGNOSIS — J90 Pleural effusion, not elsewhere classified: Secondary | ICD-10-CM | POA: Insufficient documentation

## 2017-12-04 DIAGNOSIS — C7951 Secondary malignant neoplasm of bone: Secondary | ICD-10-CM | POA: Insufficient documentation

## 2017-12-04 DIAGNOSIS — M47814 Spondylosis without myelopathy or radiculopathy, thoracic region: Secondary | ICD-10-CM

## 2017-12-04 DIAGNOSIS — R059 Cough, unspecified: Secondary | ICD-10-CM

## 2017-12-04 DIAGNOSIS — R918 Other nonspecific abnormal finding of lung field: Secondary | ICD-10-CM | POA: Insufficient documentation

## 2017-12-04 MED ORDER — IOPAMIDOL (ISOVUE-300) INJECTION 61%
75.0000 mL | Freq: Once | INTRAVENOUS | Status: AC | PRN
  Administered 2017-12-04: 75 mL via INTRAVENOUS

## 2017-12-05 ENCOUNTER — Emergency Department: Payer: Medicaid Other

## 2017-12-05 ENCOUNTER — Other Ambulatory Visit: Payer: Self-pay

## 2017-12-05 ENCOUNTER — Inpatient Hospital Stay
Admission: EM | Admit: 2017-12-05 | Discharge: 2017-12-07 | DRG: 194 | Disposition: A | Discharge status: Home / Self Care | Payer: Medicaid Other | Attending: Internal Medicine | Admitting: Internal Medicine

## 2017-12-05 DIAGNOSIS — C641 Malignant neoplasm of right kidney, except renal pelvis: Secondary | ICD-10-CM | POA: Diagnosis present

## 2017-12-05 DIAGNOSIS — J189 Pneumonia, unspecified organism: Secondary | ICD-10-CM | POA: Diagnosis not present

## 2017-12-05 DIAGNOSIS — F17210 Nicotine dependence, cigarettes, uncomplicated: Secondary | ICD-10-CM

## 2017-12-05 DIAGNOSIS — C7801 Secondary malignant neoplasm of right lung: Secondary | ICD-10-CM | POA: Diagnosis present

## 2017-12-05 DIAGNOSIS — J181 Lobar pneumonia, unspecified organism: Secondary | ICD-10-CM

## 2017-12-05 DIAGNOSIS — R918 Other nonspecific abnormal finding of lung field: Secondary | ICD-10-CM | POA: Diagnosis not present

## 2017-12-05 DIAGNOSIS — F17200 Nicotine dependence, unspecified, uncomplicated: Secondary | ICD-10-CM | POA: Diagnosis present

## 2017-12-05 DIAGNOSIS — Z923 Personal history of irradiation: Secondary | ICD-10-CM | POA: Diagnosis not present

## 2017-12-05 DIAGNOSIS — J188 Other pneumonia, unspecified organism: Principal | ICD-10-CM | POA: Diagnosis present

## 2017-12-05 DIAGNOSIS — Z808 Family history of malignant neoplasm of other organs or systems: Secondary | ICD-10-CM | POA: Diagnosis not present

## 2017-12-05 DIAGNOSIS — J9801 Acute bronchospasm: Secondary | ICD-10-CM | POA: Diagnosis present

## 2017-12-05 DIAGNOSIS — Z79899 Other long term (current) drug therapy: Secondary | ICD-10-CM | POA: Diagnosis not present

## 2017-12-05 DIAGNOSIS — J9819 Other pulmonary collapse: Secondary | ICD-10-CM | POA: Diagnosis not present

## 2017-12-05 DIAGNOSIS — C772 Secondary and unspecified malignant neoplasm of intra-abdominal lymph nodes: Secondary | ICD-10-CM | POA: Diagnosis present

## 2017-12-05 DIAGNOSIS — R599 Enlarged lymph nodes, unspecified: Secondary | ICD-10-CM | POA: Diagnosis not present

## 2017-12-05 DIAGNOSIS — C7951 Secondary malignant neoplasm of bone: Secondary | ICD-10-CM | POA: Diagnosis present

## 2017-12-05 LAB — BASIC METABOLIC PANEL
Anion gap: 8 (ref 5–15)
BUN: 10 mg/dL (ref 6–20)
CALCIUM: 9.5 mg/dL (ref 8.9–10.3)
CO2: 29 mmol/L (ref 22–32)
CREATININE: 0.95 mg/dL (ref 0.61–1.24)
Chloride: 100 mmol/L — ABNORMAL LOW (ref 101–111)
GFR calc Af Amer: >60 mL/min (ref >60)
Glucose, Bld: 129 mg/dL — ABNORMAL HIGH (ref 65–99)
POTASSIUM: 4.1 mmol/L (ref 3.5–5.1)
SODIUM: 137 mmol/L (ref 135–145)

## 2017-12-05 LAB — LACTIC ACID, PLASMA
LACTIC ACID, VENOUS: 0.8 mmol/L (ref 0.5–1.9)
LACTIC ACID, VENOUS: 0.8 mmol/L (ref 0.5–1.9)

## 2017-12-05 LAB — CBC
HCT: 40 % (ref 40.0–52.0)
Hemoglobin: 13.3 g/dL (ref 13.0–18.0)
MCH: 28.6 pg (ref 26.0–34.0)
MCHC: 33.3 g/dL (ref 32.0–36.0)
MCV: 85.9 fL (ref 80.0–100.0)
PLATELETS: 338 10*3/uL (ref 150–440)
RBC: 4.66 MIL/uL (ref 4.40–5.90)
RDW: 14.5 % (ref 11.5–14.5)
WBC: 7.6 10*3/uL (ref 3.8–10.6)

## 2017-12-05 LAB — MRSA PCR SCREENING: MRSA by PCR: NEGATIVE

## 2017-12-05 LAB — TROPONIN I: Troponin I: <0.03 ng/mL (ref <0.03)

## 2017-12-05 MED ORDER — ENOXAPARIN SODIUM 40 MG/0.4ML ~~LOC~~ SOLN
40.0000 mg | SUBCUTANEOUS | Status: DC
  Administered 2017-12-05 – 2017-12-06 (×2): 40 mg via SUBCUTANEOUS
  Filled 2017-12-05 (×2): qty 0.4

## 2017-12-05 MED ORDER — ONDANSETRON HCL 4 MG/2ML IJ SOLN
4.0000 mg | Freq: Four times a day (QID) | INTRAMUSCULAR | Status: DC | PRN
  Administered 2017-12-06: 08:00:00 4 mg via INTRAVENOUS
  Filled 2017-12-05: qty 2

## 2017-12-05 MED ORDER — HYDROCODONE-ACETAMINOPHEN 5-325 MG PO TABS
1.0000 | ORAL_TABLET | ORAL | Status: DC | PRN
  Administered 2017-12-05 – 2017-12-07 (×4): 2 via ORAL
  Filled 2017-12-05 (×4): qty 2

## 2017-12-05 MED ORDER — SODIUM CHLORIDE 0.9 % IV SOLN
1000.0000 mL | Freq: Once | INTRAVENOUS | Status: AC
  Administered 2017-12-05: 1000 mL via INTRAVENOUS

## 2017-12-05 MED ORDER — NICOTINE 14 MG/24HR TD PT24: 14.0000 mg | MEDICATED_PATCH | Freq: Every day | TRANSDERMAL | Status: DC

## 2017-12-05 MED ORDER — BENZONATATE 100 MG PO CAPS: 200.0000 mg | ORAL_CAPSULE | Freq: Three times a day (TID) | ORAL | Status: DC | PRN

## 2017-12-05 MED ORDER — ACETAMINOPHEN 650 MG RE SUPP: 650.0000 mg | Freq: Four times a day (QID) | RECTAL | Status: DC | PRN

## 2017-12-05 MED ORDER — GABAPENTIN 300 MG PO CAPS
600.0000 mg | ORAL_CAPSULE | Freq: Two times a day (BID) | ORAL | Status: DC
  Administered 2017-12-05 – 2017-12-07 (×5): 600 mg via ORAL
  Filled 2017-12-05 (×5): qty 2

## 2017-12-05 MED ORDER — HYDROCODONE-ACETAMINOPHEN 5-325 MG PO TABS
1.0000 | ORAL_TABLET | Freq: Once | ORAL | Status: AC
  Administered 2017-12-05: 1 via ORAL

## 2017-12-05 MED ORDER — IPRATROPIUM-ALBUTEROL 0.5-2.5 (3) MG/3ML IN SOLN
3.0000 mL | RESPIRATORY_TRACT | Status: DC
  Administered 2017-12-05 – 2017-12-06 (×5): 3 mL via RESPIRATORY_TRACT
  Filled 2017-12-05 (×6): qty 3

## 2017-12-05 MED ORDER — METHYLPREDNISOLONE SODIUM SUCC 125 MG IJ SOLR
60.0000 mg | INTRAMUSCULAR | Status: DC
  Administered 2017-12-05: 60 mg via INTRAVENOUS
  Filled 2017-12-05: qty 2

## 2017-12-05 MED ORDER — HYDROCODONE-ACETAMINOPHEN 5-325 MG PO TABS
ORAL_TABLET | ORAL | Status: AC
  Administered 2017-12-05: 1 via ORAL
  Filled 2017-12-05: qty 1

## 2017-12-05 MED ORDER — LEVOFLOXACIN IN D5W 750 MG/150ML IV SOLN
750.0000 mg | Freq: Once | INTRAVENOUS | Status: AC
  Administered 2017-12-05: 750 mg via INTRAVENOUS
  Filled 2017-12-05: qty 150

## 2017-12-05 MED ORDER — PIPERACILLIN-TAZOBACTAM 3.375 G IVPB
3.3750 g | Freq: Three times a day (TID) | INTRAVENOUS | Status: DC
  Administered 2017-12-05 – 2017-12-07 (×6): 3.375 g via INTRAVENOUS
  Filled 2017-12-05 (×6): qty 50

## 2017-12-05 MED ORDER — MORPHINE SULFATE ER 15 MG PO TBCR
30.0000 mg | EXTENDED_RELEASE_TABLET | Freq: Two times a day (BID) | ORAL | Status: DC
  Administered 2017-12-05 – 2017-12-06 (×4): 30 mg via ORAL
  Filled 2017-12-05 (×4): qty 2

## 2017-12-05 MED ORDER — SODIUM CHLORIDE 0.9 % IV SOLN
INTRAVENOUS | Status: DC
  Administered 2017-12-05 – 2017-12-06 (×2): via INTRAVENOUS

## 2017-12-05 MED ORDER — ONDANSETRON HCL 4 MG PO TABS: 4.0000 mg | ORAL_TABLET | Freq: Four times a day (QID) | ORAL | Status: DC | PRN

## 2017-12-05 MED ORDER — ACETAMINOPHEN 325 MG PO TABS: 650.0000 mg | ORAL_TABLET | Freq: Four times a day (QID) | ORAL | Status: DC | PRN

## 2017-12-05 NOTE — Progress Notes (Signed)
Pharmacy Antibiotic Note  Larry Watkins is a 57 y.o. male admitted on 12/05/2017 with postobstructive pneumonia.  Pharmacy has been consulted for Zosyn dosing. Patient with history significant for renal cell carcinoma with metastases to lung and right hip. Patient received levofloxacin x 1 in the ED and has MRSA PCR pending.   Plan: Zosyn EI 3.375g IV Q8hr.   Height: 5\' 7"  (170.2 cm) Weight: 220 lb (99.8 kg) IBW/kg (Calculated) : 66.1  Temp (24hrs), Avg:99 F (37.2 C), Min:98.6 F (37 C), Max:99.7 F (37.6 C)  Recent Labs  Lab 12/05/17 0955 12/05/17 1110  WBC 7.6  --   CREATININE 0.95  --   LATICACIDVEN  --  0.8    Estimated Creatinine Clearance: 100.1 mL/min (by C-G formula based on SCr of 0.95 mg/dL).    No Known Allergies  Antimicrobials this admission: Levofloxaicn 1/4 x 1  Zosyn 1/4 >>   Dose adjustments this admission: N/A  Microbiology results: 1/4 BCx: pending  1/4 MRSA PCR: sent   Thank you for allowing pharmacy to be a part of this patient's care.  Simpson,Michael L 12/05/2017 2:14 PM

## 2017-12-05 NOTE — ED Triage Notes (Addendum)
Pt presents to ED via POV from home with c/o "my lung collapsed again". Pt reports being treated for lung CA with chemo at the [facility]. Pt reports SHOB, wheezing, and right-sided chest pain. Pt is A&O, in NAD; RR even, regular, and unlabored.

## 2017-12-05 NOTE — ED Triage Notes (Addendum)
FIRST NURSE NOTE-SHOB, feels like when lung has collapsed before. Declined wheelchair. Pulled next for VS/triage. No distress at this current time.

## 2017-12-05 NOTE — Plan of Care (Signed)
  Progressing Education: Knowledge of General Education information will improve 12/05/2017 1455 - Progressing by Cherylann Parr, RN Clinical Measurements: Ability to maintain a body temperature in the normal range will improve 12/05/2017 1455 - Progressing by Cherylann Parr, RN Respiratory: Ability to maintain adequate ventilation will improve 12/05/2017 1455 - Progressing by Cherylann Parr, RN Ability to maintain a clear airway will improve 12/05/2017 1455 - Progressing by Cherylann Parr, RN

## 2017-12-05 NOTE — H&P (Signed)
[facility] NAME: Larry Watkins    MR#:  [id number]  DATE OF BIRTH:  03-20-1961  DATE OF ADMISSION:  12/05/2017  PRIMARY CARE PHYSICIAN: Earlie Server, MD   REQUESTING/REFERRING PHYSICIAN: Dr. Lavonia Drafts  CHIEF COMPLAINT:   Chief Complaint  Patient presents with  . Shortness of Breath    HISTORY OF PRESENT ILLNESS:  Larry Watkins  is a 57 y.o. 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 Patient has had obstructive pneumonia with: Bronchitis secondary to metastatic cancer, had radiation done to his lungs few months ago. Was started on antibiotic for his pneumonia as outpatient. 2 days ago had a CT chest done that showed consolidation still persistent and possible occlusion of right upper lobe bronchus either postradiation changes or from metastatic disease. He has been having chills since yesterday, worsening dry cough and sharp chest pain on deep breathing especially burning. He felt like this when he had his initial bronchus occlusion. Called his cancer doctors office and was recommended to come to the emergency room. He is being admitted for postobstructive pneumonia, failed outpatient therapy. Also has significant wheezing on exam for which he is not using any inhalers or nebulizers at home. Denies any fevers or nausea or vomiting. No abdominal pain or constipation or diarrhea.  PAST MEDICAL HISTORY:   Past Medical History:  Diagnosis Date  . 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 Bone metastasis ([place]) 07/17/2017  . Metastatic renal cell carcinoma ([place])     PAST SURGICAL HISTORY:   Past Surgical History:  Procedure Laterality Date  . FLEXIBLE BRONCHOSCOPY N/A 07/24/2017   Procedure: FLEXIBLE BRONCHOSCOPY;  Surgeon: [name], MD;  Location: [facility] ORS;   Service: Pulmonary;  Laterality: N/A;  . HERNIA REPAIR    . left middle amputation      with reattachment    SOCIAL HISTORY:   Social History   Tobacco Use  . Smoking status: Current Every Day Smoker    Packs/day: 0.25    Years: 38.00    Pack years: 9.50  . Smokeless tobacco: Never Used  Substance Use Topics  . Alcohol use: Yes    Comment: 1-2 beers, mixed drinks on weekends    FAMILY HISTORY:   Family History  Problem Relation Age of Onset  . Cancer Paternal Uncle        brain    DRUG ALLERGIES:  No Known Allergies  REVIEW OF SYSTEMS:   Review of Systems  Constitutional: Positive for malaise/fatigue. Negative for chills, fever and weight loss.  HENT: Negative for ear discharge, ear pain, hearing loss, nosebleeds and tinnitus.   Eyes: Negative for blurred vision, double vision and photophobia.  Respiratory: Positive for cough, shortness of breath and wheezing. Negative for hemoptysis.   Cardiovascular: Positive for chest pain. Negative for palpitations, orthopnea and leg swelling.  Gastrointestinal: Negative for abdominal pain, constipation, diarrhea, heartburn, melena, nausea and vomiting.  Genitourinary: Negative for dysuria, frequency, hematuria and urgency.  Musculoskeletal: Negative for back pain, myalgias and neck pain.  Skin: Negative for rash.  Neurological: Negative for dizziness, tingling, tremors, sensory change, speech change, focal weakness and headaches.  Endo/Heme/Allergies: Does not bruise/bleed easily.  Psychiatric/Behavioral: Negative for depression.    MEDICATIONS AT HOME:   Prior to Admission medications   Medication Sig Start  Date End Date Taking? Authorizing Provider  benzonatate (TESSALON) 200 MG capsule Take 1 capsule (200 mg total) by mouth 3 (three) times daily as needed for cough. 11/12/17  Yes Earlie Server, MD  gabapentin (NEURONTIN) 300 MG capsule Take 1 capsule (300 mg total) by mouth 3 (three) times daily. Patient taking differently:  Take 600 mg by mouth 2 (two) times daily.  09/25/17  Yes Earlie Server, MD  ondansetron (ZOFRAN ODT) 4 MG disintegrating tablet Take 1 tablet (4 mg total) by mouth every 8 (eight) hours as needed for nausea or vomiting. 11/26/17  Yes Earlie Server, MD  albuterol (PROVENTIL HFA;VENTOLIN HFA) 108 (90 Base) MCG/ACT inhaler Inhale 2 puffs into the lungs every 6 (six) hours as needed for wheezing or shortness of breath. Patient not taking: Reported on 11/26/2017 11/12/17   Earlie Server, MD  morphine (MS CONTIN) 30 MG 12 hr tablet Take 1 tablet (30 mg total) by mouth every 12 (twelve) hours for 14 days. 11/26/17 12/10/17  Earlie Server, MD  oxyCODONE-acetaminophen (PERCOCET) 10-325 MG tablet Take 1 tablet by mouth every 12 (twelve) hours as needed for up to 14 days for pain. 11/26/17 12/10/17  Earlie Server, MD      VITAL SIGNS:  Blood pressure 128/78, pulse (!) 102, temperature 98.8 F (37.1 C), temperature source Oral, resp. rate 19, height _0  (1.702 m), weight 99.8 kg (220 lb), SpO2 95 %.  PHYSICAL EXAMINATION:   Physical Exam  GENERAL:  57 y.o.-year-old patient lying in the bed with no acute distress.  EYES: Pupils equal, round, reactive to light and accommodation. No scleral icterus. Extraocular muscles intact.  HEENT: Head atraumatic, normocephalic. Oropharynx and nasopharynx clear.  NECK:  Supple, no jugular venous distention. No thyroid enlargement, no tenderness.  LUNGS: Scattered expiratory wheezing all over the lung fields, no rales,rhonchi or crepitation. No use of accessory muscles of respiration.  CARDIOVASCULAR: S1, S2 normal. No murmurs, rubs, or gallops.  ABDOMEN: Soft, nontender, nondistended. Bowel sounds present. No organomegaly or mass.  EXTREMITIES: No pedal edema, cyanosis, or clubbing.  NEUROLOGIC: Cranial nerves II through XII are intact. Muscle strength 5/5 in all extremities. Sensation intact. Gait not checked.  PSYCHIATRIC: The patient is alert and oriented x 3.  SKIN: No obvious rash,  lesion, or ulcer.   LABORATORY PANEL:   CBC Recent Labs  Lab 12/05/17 0955  WBC 7.6  HGB 13.3  HCT 40.0  PLT 338   ------------------------------------------------------------------------------------------------------------------  Chemistries  Recent Labs  Lab 12/05/17 0955  NA 137  K 4.1  CL 100*  CO2 29  GLUCOSE 129*  BUN 10  CREATININE 0.95  CALCIUM 9.5   ------------------------------------------------------------------------------------------------------------------  Cardiac Enzymes Recent Labs  Lab 12/05/17 0955  TROPONINI <0.03   ------------------------------------------------------------------------------------------------------------------  RADIOLOGY:  Dg Chest 2 View  Result Date: 12/05/2017 CLINICAL DATA:  Previous radiation treatment. History of right upper lobe collapse. EXAM: CHEST  2 VIEW COMPARISON:  CT 12/04/2016 FINDINGS: Heart size remains normal. Chronic right upper lobe collapse as seen previously. Patchy pulmonary densities again evident in the mid and lower lungs, fell most consistent with organizing pneumonia by CT. Left lower lobe pulmonary nodule as shown at CT. No visible effusions. IMPRESSION: No change compared to the CT of yesterday. Persistent right upper lobe collapse. Areas of abnormal density in both lower lungs right more than left as described by CT. Electronically Signed   By: Nelson Chimes M.D.   On: 12/05/2017 10:16   Ct Chest W Contrast  Result Date: 12/04/2017  CLINICAL DATA:  Metastatic renal cell carcinoma. Persistent cough and chest pain for 3 months. Ongoing chemotherapy. History of palliative radiation therapy to the lung. EXAM: CT CHEST WITH CONTRAST TECHNIQUE: Multidetector CT imaging of the chest was performed during intravenous contrast administration. CONTRAST:  24m ISOVUE-300 IOPAMIDOL (ISOVUE-300) INJECTION 61% COMPARISON:  11/12/2017 chest radiograph.   10/08/2017 chest CT. FINDINGS: Cardiovascular: Normal heart size. No  significant pericardial fluid/thickening. Left anterior descending coronary atherosclerosis. Great vessels are normal in course and caliber. No central pulmonary emboli. Mediastinum/Nodes: No discrete thyroid nodules. Unremarkable esophagus. No axillary adenopathy. Stable enlarged left paratracheal nodes up to 1.6 cm (series 2/ image 53), previously 1.7 cm. Stable enlarged 1.6 cm AP window node (series 2/ image 53), previously 1.7 cm. Mildly enlarged 1.0 cm subcarinal node (series 2/ image 68), previously 1.3 cm, and mildly decreased. Right hilar adenopathy measures 1.9 cm (series 2/ image 58), previously 2.2 cm, mildly decreased. Stable enlarged 1.9 cm left infrahilar node (series 2/ image 74), previously 1.9 cm. Lungs/Pleura: No pneumothorax. Trace dependent right pleural effusion, new. No left pleural effusion. There is new occlusion of the right upper lobe bronchus. There is new sharply marginated paramediastinal consolidation in the right upper lobe with associated significant volume loss. There is patchy perilobular consolidation, ground-glass attenuation and reticulation in the right lower lobe, largely new in the interval. Most of the previously visualized more centrally located patchy right lower lobe opacities on the prior chest CT have decreased. New 9 mm anterior left lower lobe pulmonary nodule (series 3/image 97). Upper abdomen: No acute abnormality. Musculoskeletal: There is continued increased sclerosis within the numerous scattered sclerotic osseous lesions throughout the thoracic skeleton involving the sternum, thoracic spine, ribs, clavicles a shoulder girdles. No new osseous lesions are appreciated. Partially visualized stable pathologic moderate C7 vertebral compression fracture. Mild thoracic spondylosis. IMPRESSION: 1. Mediastinal and bilateral hilar adenopathy is stable to mildly decreased. 2. New sharply marginated paramediastinal consolidation and volume loss in the right upper lobe with  new occlusion of the right upper lobe bronchus, most compatible with evolving postradiation change. 3. Waxing and waning patchy perilobular right lower lobe opacities, most compatible with organizing pneumonia, cannot exclude drug toxicity. 4. New trace dependent right pleural effusion. 5. New indeterminate 9 mm left lower lobe pulmonary nodule, differential includes inflammatory or metastatic nodule. Recommend attention on follow-up short-term chest CT in 3 months. 6. Increased sclerosis throughout the widespread thoracic osseous metastases, compatible with treatment effect. 7. One vessel coronary atherosclerosis. Electronically Signed   By: JIlona SorrelM.D.   On: 12/04/2017 09:23    EKG:   Orders placed or performed during the hospital encounter of 12/05/17  . ED EKG within 10 minutes  . ED EKG within 10 minutes    IMPRESSION AND PLAN:   Larry Watkins is a 57y.o. 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  1. Postobstructive pneumonia-admitted, started on Zosyn -Pulmonary consult for occluded right upper lobe bronchus. -Oncology consult. -MRSA PCR ordered. IV fluids and monitor -Continue duo nebs for his bronchospasm. -Also added IV Solu-Medrol  2. Metastatic renal cell carcinoma-diagnosed at the end of July 2018 with a right kidney mass and also has abdominal lymphadenopathy, lung nodules and osseous metastases. -Status post radiation to his right hip and right lung. -Oncology consult for this same. Continue pain medications. -On  every 2 weeks treatment with immunotherapy  3. Tobacco use disorder-counseled on admission. Nicotine patch added.  4. DVT prophylaxis-Lovenox   All the records are reviewed and case discussed with ED provider. Management plans discussed with the patient, family and they are in  agreement.  CODE STATUS: Full code  TOTAL TIME TAKING CARE OF THIS PATIENT: 50 minutes.    Gladstone Lighter M.D on 12/05/2017 at 12:50 PM  Between 7am to 6pm - Pager - [phone]  After 6pm go to [email or web address] - password [email or web address] [facility]  Office  [phone]  CC: Primary care physician; Earlie Server, MD

## 2017-12-05 NOTE — ED Notes (Signed)
Called report to Siri Cole RN

## 2017-12-05 NOTE — ED Provider Notes (Signed)
[facility] Emergency Department Provider Note   ____________________________________________    I have reviewed the triage vital signs and the nursing notes.   HISTORY  Chief Complaint Shortness of Breath     HPI Larry Watkins is a 57 y.o. male who presents with complaints of shortness of breath and pleuritic chest pain primarily in the right of his chest.  Patient reports the symptoms started this morning.  He reports the pain is moderate to severe worse with deep inhalation.  He has not taken anything for this.  Mild shortness of breath.  Patient does have cancer followed by Dr. Tasia Catchings at the cancer center.  Had a CT scan yesterday for persistent pneumonia after being treated with antibiotics.  Denies fevers or chills at this time.  No calf pain or swelling.  History of collapsed lung secondary to blocked bronchus which responded to radiation   Past Medical History:  Diagnosis Date  . 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 Bone metastasis ([place]) 07/17/2017  . Metastatic renal cell carcinoma [facility] [address])     Patient Active Problem List   Diagnosis Date Noted  . Goals of care, counseling/discussion 08/01/2017  . Metastatic renal cell carcinoma ([place]) 07/17/2017  . 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 Collapse of right lung 07/15/2017  . Renal cell carcinoma of right kidney ([place]) 07/14/2017    Past Surgical History:  Procedure Laterality Date  . FLEXIBLE BRONCHOSCOPY N/A 07/24/2017   Procedure: FLEXIBLE BRONCHOSCOPY;  Surgeon: [name], MD;  Location: [facility] ORS;  Service: Pulmonary;  Laterality: N/A;  . HERNIA REPAIR    . left middle amputation      with reattachment    Prior to Admission medications   Medication Sig Start Date End Date Taking? Authorizing Provider  benzonatate (TESSALON) 200 MG capsule Take 1 capsule (200 mg total) by mouth 3 (three) times daily as needed for cough. 11/12/17  Yes Earlie Server, MD  gabapentin (NEURONTIN) 300 MG capsule  Take 1 capsule (300 mg total) by mouth 3 (three) times daily. Patient taking differently: Take 600 mg by mouth 2 (two) times daily.  09/25/17  Yes Earlie Server, MD  ondansetron (ZOFRAN ODT) 4 MG disintegrating tablet Take 1 tablet (4 mg total) by mouth every 8 (eight) hours as needed for nausea or vomiting. 11/26/17  Yes Earlie Server, MD  albuterol (PROVENTIL HFA;VENTOLIN HFA) 108 (90 Base) MCG/ACT inhaler Inhale 2 puffs into the lungs every 6 (six) hours as needed for wheezing or shortness of breath. Patient not taking: Reported on 11/26/2017 11/12/17   Earlie Server, MD  morphine (MS CONTIN) 30 MG 12 hr tablet Take 1 tablet (30 mg total) by mouth every 12 (twelve) hours for 14 days. 11/26/17 12/10/17  Earlie Server, MD  oxyCODONE-acetaminophen (PERCOCET) 10-325 MG tablet Take 1 tablet by mouth every 12 (twelve) hours as needed for up to 14 days for pain. 11/26/17 12/10/17  Earlie Server, MD     Allergies Patient has no known allergies.  Family History  Problem Relation Age of Onset  . Cancer Paternal Uncle        brain    Social History Social History   Tobacco Use  . Smoking status: Current Every Day Smoker    Packs/day: 0.25    Years: 38.00    Pack years: 9.50  . Smokeless tobacco: Never Used  Substance Use Topics  . Alcohol use: No  . Drug use: No    Review of Systems  Constitutional:  No fever/chills Eyes: No visual changes.  ENT: No sore throat. Cardiovascular: As above Respiratory: As above Gastrointestinal: No abdominal pain.  No nausea, no vomiting.   Genitourinary: Negative for dysuria. Musculoskeletal: Negative for back pain. Skin: Negative for rash. Neurological: Negative for headaches   ____________________________________________   PHYSICAL EXAM:  VITAL SIGNS: ED Triage Vitals  Enc Vitals Group     BP 12/05/17 0934 109/71     Pulse Rate 12/05/17 0934 (!) 106     Resp --      Temp 12/05/17 0934 98.8 F (37.1 C)     Temp Source 12/05/17 0934 Oral     SpO2 --      Weight  12/05/17 0934 99.8 kg (220 lb)     Height 12/05/17 0934 1.702 m (_0 )     Head Circumference --      Peak Flow --      Pain Score 12/05/17 0951 10     Pain Loc --      Pain Edu? --      Excl. in [place]? --     Constitutional: Alert and oriented. No acute distress.  Eyes: Conjunctivae are normal.  Nose: No congestion/rhinnorhea. Mouth/Throat: Mucous membranes are moist.   Neck:  Painless ROM Cardiovascular: Normal rate, regular rhythm.   Good peripheral circulation. Respiratory: Normal respiratory effort.  No retractions.  Decreased lung sounds right upper lung Gastrointestinal: Soft and nontender. No distention.  No CVA tenderness. Genitourinary: deferred Musculoskeletal:  Warm and well perfused Neurologic:  Normal speech and language. No gross focal neurologic deficits are appreciated.  Skin:  Skin is warm, dry and intact. No rash noted. Psychiatric: Mood and affect are normal. Speech and behavior are normal.  ____________________________________________   LABS (all labs ordered are listed, but only abnormal results are displayed)  Labs Reviewed  BASIC METABOLIC PANEL - Abnormal; Notable for the following components:      Result Value   Chloride 100 (*)    Glucose, Bld 129 (*)    All other components within normal limits  CULTURE, BLOOD (ROUTINE X 2)  CULTURE, BLOOD (ROUTINE X 2)  CBC  TROPONIN I  LACTIC ACID, PLASMA  LACTIC ACID, PLASMA   ____________________________________________  EKG  ED ECG REPORT I, Lavonia Drafts, the attending physician, personally viewed and interpreted this ECG.  Date: 12/05/2017  Rhythm: Tachycardia QRS Axis: normal Intervals: normal ST/T Wave abnormalities: normal Narrative Interpretation: Tachycardia  ____________________________________________  RADIOLOGY  Chest x-ray shows persistent right upper lobe collapse Reviewed CT scan from yesterday which showed obstructive  bronchus ____________________________________________   PROCEDURES  Procedure(s) performed: No  Procedures   Critical Care performed: No ____________________________________________   INITIAL IMPRESSION / ASSESSMENT AND PLAN / ED COURSE  Pertinent labs & imaging results that were available during my care of the patient were reviewed by me and considered in my medical decision making (see chart for details).  Patient presents with right-sided upper chest pain, CT scan shows obstructive lesion also likely pneumonia on CT.  Discussed with his oncologist Dr. Tasia Catchings who recommends IV antibiotics, admission and pulmonary consult    ____________________________________________   FINAL CLINICAL IMPRESSION(S) / ED DIAGNOSES  Final diagnoses:  Community acquired pneumonia of right middle lobe of lung ([place])  Collapsed lung        Note:  This document was prepared using Dragon voice recognition software and may include unintentional dictation errors.    Lavonia Drafts, MD 12/05/17 1148

## 2017-12-05 NOTE — ED Notes (Signed)
Pt transported to room 115

## 2017-12-05 NOTE — Consult Note (Signed)
Hematology/Oncology Consult note [facility] Telephone:([phone] [phone]  Patient Care Team: Earlie Server, MD as PCP - General (Oncology)   Name of the patient: Larry Watkins  [id number]  [birth year]   Date of visit: 12/05/17 REASON FOR COSULTATION:  Metastatic cancer History of presenting illness-  57 y.o. male with a known history of metastatic renal cell carcinoma currently on Nivolumab maintenance treatments presented to ER complaining worsening SOB, cough and right side chest pain. He has had pneumonia about 4 weeks ago and was treatment with a course of Z pak and tapering course of steroids. Symptom improved. Patient had CT chest done 2 days ago which showed occlusion of right upper lobe bronchus, either post radiation changes or metastatic disease. He reports having chills yesterday, and feeling mor SOB and also having chest pain with cough.   Review of systems- Review of Systems  Constitutional: Positive for chills. Negative for fever and weight loss.  HENT: Negative for hearing loss and tinnitus.   Eyes: Negative for blurred vision.  Respiratory: Positive for sputum production and shortness of breath.   Cardiovascular: Negative for chest pain and palpitations.  Gastrointestinal: Negative for heartburn and vomiting.  Genitourinary: Negative for dysuria and urgency.  Musculoskeletal: Negative for myalgias.  Skin: Negative for rash.  Neurological: Negative for dizziness.  Endo/Heme/Allergies: Does not bruise/bleed easily.  Psychiatric/Behavioral: Negative for depression.    No Known Allergies  Patient Active Problem List   Diagnosis Date Noted  . Pneumonia 12/05/2017  . Goals of care, counseling/discussion 08/01/2017  . Metastatic renal cell carcinoma ([place]) 07/17/2017  . Bone metastasis ([place]) 07/17/2017  . Collapse of right lung 07/15/2017  . Renal cell carcinoma of right kidney ([place]) 07/14/2017     Past Medical History:  Diagnosis  Date  . Bone metastasis ([place]) 07/17/2017  . Bone metastasis ([place]) 07/17/2017  . Metastatic renal cell carcinoma [facility])      Past Surgical History:  Procedure Laterality Date  . FLEXIBLE BRONCHOSCOPY N/A 07/24/2017   Procedure: FLEXIBLE BRONCHOSCOPY;  Surgeon: [name], MD;  Location: [facility] ORS;  Service: Pulmonary;  Laterality: N/A;  . HERNIA REPAIR    . left middle amputation      with reattachment    Social History   Socioeconomic History  . Marital status: Single    Spouse name: Not on file  . Number of children: Not on file  . Years of education: Not on file  . Highest education level: Not on file  Social Needs  . Financial resource strain: Not on file  . Food insecurity - worry: Not on file  . Food insecurity - inability: Not on file  . Transportation needs - medical: Not on file  . Transportation needs - non-medical: Not on file  Occupational History  . Not on file  Tobacco Use  . Smoking status: Current Every Day Smoker    Packs/day: 0.25    Years: 38.00    Pack years: 9.50  . Smokeless tobacco: Never Used  Substance and Sexual Activity  . Alcohol use: Yes    Comment: 1-2 beers, mixed drinks on weekends  . Drug use: No  . Sexual activity: Not on file  Other Topics Concern  . Not on file  Social History Narrative   Independent at baseline.     Family History  Problem Relation Age of Onset  . Cancer Paternal Uncle        brain     Current Facility-Administered Medications:  .  0.9 %  sodium chloride infusion, , Intravenous, Continuous, Gladstone Lighter, MD, Last Rate: 75 mL/hr at 12/05/17 1421 .  acetaminophen (TYLENOL) tablet 650 mg, 650 mg, Oral, Q6H PRN **OR** acetaminophen (TYLENOL) suppository 650 mg, 650 mg, Rectal, Q6H PRN, Gladstone Lighter, MD .  benzonatate (TESSALON) capsule 200 mg, 200 mg, Oral, TID PRN, Gladstone Lighter, MD .  enoxaparin (LOVENOX) injection 40 mg, 40 mg, Subcutaneous, Q24H, Kalisetti, Radhika, MD .  gabapentin  (NEURONTIN) capsule 600 mg, 600 mg, Oral, BID, Gladstone Lighter, MD, 600 mg at 12/05/17 1437 .  HYDROcodone-acetaminophen (NORCO/VICODIN) 5-325 MG per tablet 1-2 tablet, 1-2 tablet, Oral, Q4H PRN, Gladstone Lighter, MD .  ipratropium-albuterol (DUONEB) 0.5-2.5 (3) MG/3ML nebulizer solution 3 mL, 3 mL, Nebulization, Q4H, Kalisetti, Radhika, MD, 3 mL at 12/05/17 2008 .  methylPREDNISolone sodium succinate (SOLU-MEDROL) 125 mg/2 mL injection 60 mg, 60 mg, Intravenous, Q24H, Kalisetti, Radhika, MD, 60 mg at 12/05/17 1315 .  morphine (MS CONTIN) 12 hr tablet 30 mg, 30 mg, Oral, Q12H, Gladstone Lighter, MD, 30 mg at 12/05/17 1437 .  nicotine (NICODERM CQ - dosed in mg/24 hours) patch 14 mg, 14 mg, Transdermal, Daily, Tressia Miners, Radhika, MD .  ondansetron (ZOFRAN) tablet 4 mg, 4 mg, Oral, Q6H PRN **OR** ondansetron (ZOFRAN) injection 4 mg, 4 mg, Intravenous, Q6H PRN, Gladstone Lighter, MD .  piperacillin-tazobactam (ZOSYN) IVPB 3.375 g, 3.375 g, Intravenous, Q8H, Gladstone Lighter, MD, Stopped at 12/05/17 1737   Physical exam:  Vitals:   12/05/17 1300 12/05/17 1329 12/05/17 1330 12/05/17 1413  BP: 118/74 114/65 114/69 (!) 114/58  Pulse: (!) 102 (!) 102 (!) 101 97  Resp: 19 20 (!) 23 18  Temp:  98.6 F (37 C)  99.7 F (37.6 C)  TempSrc:  Oral  Oral  SpO2: 96% 96% 96% 95%  Weight:      Height:       GENERAL:Alert, no distress and comfortable.  EYES: no pallor or icterus OROPHARYNX: no thrush or ulceration;  NECK: supple, no masses felt LYMPH:  no palpable lymphadenopathy in the cervical, axillary or inguinal regions LUNGS: wheezes, HEART/CVS: regular rate & rhythm and no murmurs; No lower extremity edema ABDOMEN: abdomen soft, non-tender and normal bowel sounds Musculoskeletal:no cyanosis of digits and no clubbing  PSYCH: alert & oriented x 3  NEURO: no focal motor/sensory deficits SKIN:  no rashes or significant lesions     CMP Latest Ref Rng & Units 12/05/2017  Glucose 65 - 99  mg/dL 129(H)  BUN 6 - 20 mg/dL 10  Creatinine 0.61 - 1.24 mg/dL 0.95  Sodium 135 - 145 mmol/L 137  Potassium 3.5 - 5.1 mmol/L 4.1  Chloride 101 - 111 mmol/L 100(L)  CO2 22 - 32 mmol/L 29  Calcium 8.9 - 10.3 mg/dL 9.5  Total Protein 6.5 - 8.1 g/dL -  Total Bilirubin 0.3 - 1.2 mg/dL -  Alkaline Phos 38 - 126 U/L -  AST 15 - 41 U/L -  ALT 17 - 63 U/L -   CBC Latest Ref Rng & Units 12/05/2017  WBC 3.8 - 10.6 K/uL 7.6  Hemoglobin 13.0 - 18.0 g/dL 13.3  Hematocrit 40.0 - 52.0 % 40.0  Platelets 150 - 440 K/uL 338   Dg Chest 2 View  Result Date: 12/05/2017 CLINICAL DATA:  Previous radiation treatment. History of right upper lobe collapse. EXAM: CHEST  2 VIEW COMPARISON:  CT 12/04/2016 FINDINGS: Heart size remains normal. Chronic right upper lobe collapse as seen previously. Patchy pulmonary densities again evident in the mid  and lower lungs, fell most consistent with organizing pneumonia by CT. Left lower lobe pulmonary nodule as shown at CT. No visible effusions. IMPRESSION: No change compared to the CT of yesterday. Persistent right upper lobe collapse. Areas of abnormal density in both lower lungs right more than left as described by CT. Electronically Signed   By: Nelson Chimes M.D.   On: 12/05/2017 10:16   Dg Chest 2 View  Result Date: 11/12/2017 CLINICAL DATA:  Cough. Renal cell carcinoma with metastatic disease to the left femur. EXAM: CHEST  2 VIEW COMPARISON:  CT chest 10/08/2017. FINDINGS: Progressive right upper lower and middle lobe airspace disease is compatible with progressive pneumonia. Left lung is clear. There no edema or effusion. The visualized soft tissues and bony thorax are unremarkable. IMPRESSION: 1. Progressive right upper and middle lobe pneumonia. Electronically Signed   By: San Morelle M.D.   On: 11/12/2017 15:44   Ct Chest W Contrast  Result Date: 12/04/2017 CLINICAL DATA:  Metastatic renal cell carcinoma. Persistent cough and chest pain for 3 months. Ongoing  chemotherapy. History of palliative radiation therapy to the lung. EXAM: CT CHEST WITH CONTRAST TECHNIQUE: Multidetector CT imaging of the chest was performed during intravenous contrast administration. CONTRAST:  66m ISOVUE-300 IOPAMIDOL (ISOVUE-300) INJECTION 61% COMPARISON:  11/12/2017 chest radiograph.   10/08/2017 chest CT. FINDINGS: Cardiovascular: Normal heart size. No significant pericardial fluid/thickening. Left anterior descending coronary atherosclerosis. Great vessels are normal in course and caliber. No central pulmonary emboli. Mediastinum/Nodes: No discrete thyroid nodules. Unremarkable esophagus. No axillary adenopathy. Stable enlarged left paratracheal nodes up to 1.6 cm (series 2/ image 53), previously 1.7 cm. Stable enlarged 1.6 cm AP window node (series 2/ image 53), previously 1.7 cm. Mildly enlarged 1.0 cm subcarinal node (series 2/ image 68), previously 1.3 cm, and mildly decreased. Right hilar adenopathy measures 1.9 cm (series 2/ image 58), previously 2.2 cm, mildly decreased. Stable enlarged 1.9 cm left infrahilar node (series 2/ image 74), previously 1.9 cm. Lungs/Pleura: No pneumothorax. Trace dependent right pleural effusion, new. No left pleural effusion. There is new occlusion of the right upper lobe bronchus. There is new sharply marginated paramediastinal consolidation in the right upper lobe with associated significant volume loss. There is patchy perilobular consolidation, ground-glass attenuation and reticulation in the right lower lobe, largely new in the interval. Most of the previously visualized more centrally located patchy right lower lobe opacities on the prior chest CT have decreased. New 9 mm anterior left lower lobe pulmonary nodule (series 3/image 97). Upper abdomen: No acute abnormality. Musculoskeletal: There is continued increased sclerosis within the numerous scattered sclerotic osseous lesions throughout the thoracic skeleton involving the sternum, thoracic  spine, ribs, clavicles a shoulder girdles. No new osseous lesions are appreciated. Partially visualized stable pathologic moderate C7 vertebral compression fracture. Mild thoracic spondylosis. IMPRESSION: 1. Mediastinal and bilateral hilar adenopathy is stable to mildly decreased. 2. New sharply marginated paramediastinal consolidation and volume loss in the right upper lobe with new occlusion of the right upper lobe bronchus, most compatible with evolving postradiation change. 3. Waxing and waning patchy perilobular right lower lobe opacities, most compatible with organizing pneumonia, cannot exclude drug toxicity. 4. New trace dependent right pleural effusion. 5. New indeterminate 9 mm left lower lobe pulmonary nodule, differential includes inflammatory or metastatic nodule. Recommend attention on follow-up short-term chest CT in 3 months. 6. Increased sclerosis throughout the widespread thoracic osseous metastases, compatible with treatment effect. 7. One vessel coronary atherosclerosis. Electronically Signed   By: JRinaldo Ratel  Poff M.D.   On: 12/04/2017 09:23   Dg Femur Min 2 Views Left  Result Date: 11/12/2017 CLINICAL DATA:  Left femoral bone metastasis. EXAM: LEFT FEMUR 2 VIEWS COMPARISON:  Bone scan 10/08/2017.  CT 08/10/2017 FINDINGS: Goal the 2 largest areas of lytic change are in the intertrochanteric to subtrochanteric region of the left femur, measuring on the order of 2.5-3 cm in diameter. 1 cm lucency in the acetabular region. Indistinct lucency at the ischial tuberosity. No evidence of lytic lesion more distally within the left femur. IMPRESSION: Lytic lesions in the left pelvis and proximal femur as outlined above. No evidence of gross progression when compared to the CT of 08/10/2017. No pathologic fracture at this moment. Electronically Signed   By: Nelson Chimes M.D.   On: 11/12/2017 15:45    Assessment and plan- Patient is a 57 y.o. male with metastatic renal cell carcinoma s/p radiation and  currently on immunotherapy presented with SOB, cough and chest pain. CT showed stable thoracic adenopathy, right upper bronchus new occulsion, and waxing and waning patchy perilobular right lower lobe opacities.   # SOB/cough ?obstructive pneumonia due to radiation changes or progression of metastatic cancer.  Agree with trial of IV antibiotics, high dose steroids. Appreciate pulmonary input.  Immunotherapy induced pneumonitis is possible, however usually bilateral. He is on steroids.   # Metastatic renal cell carcinoma, most disease appear stable. Will hold immunotherapy, continue patient on steroids for treatment of potential radiation or immunotherapy associated pneumonitis. I can taper steroid course slowly outpatient.    Thank you for this kind referral and the opportunity to participate in the care of this patient. Dr.Brahmanday is covering the weekend.   Earlie Server, MD, PhD Hematology Oncology [facility] Pager- [phone] 12/05/2017

## 2017-12-05 NOTE — ED Notes (Signed)
Patient reports discomfort on inhaling

## 2017-12-06 LAB — BASIC METABOLIC PANEL
Anion gap: 10 (ref 5–15)
BUN: 13 mg/dL (ref 6–20)
CHLORIDE: 104 mmol/L (ref 101–111)
CO2: 23 mmol/L (ref 22–32)
Calcium: 8.5 mg/dL — ABNORMAL LOW (ref 8.9–10.3)
Creatinine, Ser: 0.72 mg/dL (ref 0.61–1.24)
GFR calc non Af Amer: >60 mL/min (ref >60)
Glucose, Bld: 154 mg/dL — ABNORMAL HIGH (ref 65–99)
POTASSIUM: 4.1 mmol/L (ref 3.5–5.1)
SODIUM: 137 mmol/L (ref 135–145)

## 2017-12-06 LAB — CBC
HEMATOCRIT: 34.7 % — AB (ref 40.0–52.0)
Hemoglobin: 11.6 g/dL — ABNORMAL LOW (ref 13.0–18.0)
MCH: 28.6 pg (ref 26.0–34.0)
MCHC: 33.3 g/dL (ref 32.0–36.0)
MCV: 85.9 fL (ref 80.0–100.0)
PLATELETS: 305 10*3/uL (ref 150–440)
RBC: 4.04 MIL/uL — AB (ref 4.40–5.90)
RDW: 14.4 % (ref 11.5–14.5)
WBC: 8.2 10*3/uL (ref 3.8–10.6)

## 2017-12-06 LAB — HIV ANTIBODY (ROUTINE TESTING W REFLEX): HIV Screen 4th Generation wRfx: NONREACTIVE

## 2017-12-06 MED ORDER — DOCUSATE SODIUM 100 MG PO CAPS
100.0000 mg | ORAL_CAPSULE | Freq: Two times a day (BID) | ORAL | Status: DC
  Administered 2017-12-06 (×2): 100 mg via ORAL
  Filled 2017-12-06 (×2): qty 1

## 2017-12-06 MED ORDER — IPRATROPIUM-ALBUTEROL 0.5-2.5 (3) MG/3ML IN SOLN
3.0000 mL | Freq: Four times a day (QID) | RESPIRATORY_TRACT | Status: DC
  Administered 2017-12-06 – 2017-12-07 (×2): 3 mL via RESPIRATORY_TRACT
  Filled 2017-12-06 (×2): qty 3

## 2017-12-06 MED ORDER — BUDESONIDE 0.5 MG/2ML IN SUSP
0.5000 mg | Freq: Two times a day (BID) | RESPIRATORY_TRACT | Status: DC
  Administered 2017-12-06 – 2017-12-07 (×2): 0.5 mg via RESPIRATORY_TRACT
  Filled 2017-12-06 (×2): qty 2

## 2017-12-06 MED ORDER — METHYLPREDNISOLONE SODIUM SUCC 40 MG IJ SOLR
40.0000 mg | Freq: Two times a day (BID) | INTRAMUSCULAR | Status: DC
  Administered 2017-12-06 (×2): 40 mg via INTRAVENOUS
  Filled 2017-12-06 (×2): qty 1

## 2017-12-06 MED ORDER — SENNA 8.6 MG PO TABS: 1.0000 | ORAL_TABLET | Freq: Every day | ORAL | Status: DC | PRN

## 2017-12-06 MED ORDER — BISACODYL 5 MG PO TBEC: 5.0000 mg | DELAYED_RELEASE_TABLET | Freq: Every day | ORAL | Status: DC | PRN

## 2017-12-06 NOTE — Progress Notes (Signed)
57 yo with Metastatic Renal cell Cancer to Lungs and Hip NEW RT SIDED UPPER LOBE OPACITY CT chest reviewed-RT upper lobe opacity, bulky adenopathy  Dx-pneumonia, post radiation pneumonitis Post obstructive process  PLAN 1.aggressive BD therapy 2.abx changed to zosyn 3.continue IV steroids 4.follow up oncology recs  Bronchoscopy may NOT help at this time as dx already established, doubt TB.  Prognosis is poor.   Will follow along.    Corrin Parker, M.D.  [facility]  Medical Director [place] Director [facility] Cardio-Pulmonary Department

## 2017-12-06 NOTE — Plan of Care (Signed)
  Progressing Education: Knowledge of General Education information will improve 12/06/2017 0452 - Progressing by Sonda Primes, RN Clinical Measurements: Ability to maintain a body temperature in the normal range will improve 12/06/2017 0452 - Progressing by Sonda Primes, RN Respiratory: Ability to maintain adequate ventilation will improve 12/06/2017 0452 - Progressing by Sonda Primes, RN Ability to maintain a clear airway will improve 12/06/2017 0452 - Progressing by Sonda Primes, RN

## 2017-12-06 NOTE — Progress Notes (Signed)
[place] [facility] [place] NAME: Larry Watkins    MR#:  [id number]  DATE OF BIRTH:  1960/12/31  SUBJECTIVE:  CHIEF COMPLAINT:   Chief Complaint  Patient presents with  . Shortness of Breath   The patient has no complaints except mild cough. REVIEW OF SYSTEMS:  Review of Systems  Constitutional: Negative for chills, fever and malaise/fatigue.  HENT: Negative for sore throat.   Eyes: Negative for blurred vision and double vision.  Respiratory: Positive for cough. Negative for hemoptysis, shortness of breath, wheezing and stridor.   Cardiovascular: Negative for chest pain, palpitations, orthopnea and leg swelling.  Gastrointestinal: Negative for abdominal pain, blood in stool, diarrhea, melena, nausea and vomiting.  Genitourinary: Negative for dysuria, flank pain and hematuria.  Musculoskeletal: Negative for back pain and joint pain.  Skin: Negative for rash.  Neurological: Negative for dizziness, sensory change, focal weakness, seizures, loss of consciousness, weakness and headaches.  Endo/Heme/Allergies: Negative for polydipsia.  Psychiatric/Behavioral: Negative for depression. The patient is not nervous/anxious.     DRUG ALLERGIES:  No Known Allergies VITALS:  Blood pressure 109/64, pulse 79, temperature 97.9 F (36.6 C), temperature source Oral, resp. rate 19, height 5\' 7"  (1.702 m), weight 220 lb (99.8 kg), SpO2 95 %. PHYSICAL EXAMINATION:  Physical Exam  Constitutional: He is oriented to person, place, and time and well-developed, well-nourished, and in no distress.  HENT:  Head: Normocephalic.  Mouth/Throat: Oropharynx is clear and moist.  Eyes: Conjunctivae and EOM are normal. Pupils are equal, round, and reactive to light. No scleral icterus.  Neck: Normal range of motion. Neck supple. No JVD present. No tracheal deviation present.  Cardiovascular: Normal rate, regular rhythm and normal heart sounds. Exam reveals no gallop.  No  murmur heard. Pulmonary/Chest: Effort normal and breath sounds normal. No respiratory distress. He has no wheezes. He has no rales.  Abdominal: Soft. Bowel sounds are normal. He exhibits no distension. There is no tenderness. There is no rebound.  Musculoskeletal: Normal range of motion. He exhibits no edema or tenderness.  Neurological: He is alert and oriented to person, place, and time. No cranial nerve deficit.  Skin: No rash noted. No erythema.  Psychiatric: Affect normal.   LABORATORY PANEL:  Male CBC Recent Labs  Lab 12/06/17 0412  WBC 8.2  HGB 11.6*  HCT 34.7*  PLT 305   ------------------------------------------------------------------------------------------------------------------ Chemistries  Recent Labs  Lab 12/06/17 0412  NA 137  K 4.1  CL 104  CO2 23  GLUCOSE 154*  BUN 13  CREATININE 0.72  CALCIUM 8.5*   RADIOLOGY:  No results found. ASSESSMENT AND PLAN:   Larry Watkins  is a 57 y.o. 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  1. Postobstructive pneumonia Bronchoscopy may NOT help at this time as dx already established per Dr. Mortimer Fries. -Continue Zosyn, IV Solu-Medrol, duo nebs for his bronchospasm.  2. Metastatic renal cell carcinoma-diagnosed at the end of July 2018 with a right kidney mass and also has abdominal lymphadenopathy, lung nodules and osseous metastases. -Status post radiation to his right hip and right lung. Per Dr. Tasia Catchings, hold immunotherapy, continue patient on steroids for treatment of potential radiation or immunotherapy associated pneumonitis. can taper steroid course slowly outpatient.   3. Tobacco use disorder Smoking cessation was counseled for 4 minutes.  Nicotine  patch.  All the records are reviewed and case discussed with Care Management/Social  Worker. Management plans discussed with the patient, family and they are in agreement.  CODE STATUS: Full Code  TOTAL TIME TAKING CARE OF THIS PATIENT: 35 minutes.   More than 50% of the time was spent in counseling/coordination of care: YES  POSSIBLE D/C IN 1 DAYS, DEPENDING ON CLINICAL CONDITION.   Demetrios Loll M.D on 12/06/2017 at 2:07 PM  Between 7am to 6pm - Pager - [phone]  After 6pm go to [email or web address] - [facility]

## 2017-12-06 NOTE — Plan of Care (Signed)
  Progressing Education: Knowledge of General Education information will improve 12/06/2017 1322 - Progressing by Cherylann Parr, RN Clinical Measurements: Ability to maintain a body temperature in the normal range will improve 12/06/2017 1322 - Progressing by Cherylann Parr, RN Respiratory: Ability to maintain adequate ventilation will improve 12/06/2017 1322 - Progressing by Cherylann Parr, RN Ability to maintain a clear airway will improve 12/06/2017 1322 - Progressing by Cherylann Parr, RN

## 2017-12-07 MED ORDER — PREDNISONE 50 MG PO TABS
50.0000 mg | ORAL_TABLET | Freq: Every day | ORAL | Status: DC
  Administered 2017-12-07: 09:00:00 50 mg via ORAL
  Filled 2017-12-07: qty 1

## 2017-12-07 MED ORDER — AMOXICILLIN-POT CLAVULANATE 875-125 MG PO TABS: 1.0000 | ORAL_TABLET | Freq: Two times a day (BID) | ORAL | 0 refills | Status: DC

## 2017-12-07 MED ORDER — AMOXICILLIN-POT CLAVULANATE 875-125 MG PO TABS
1.0000 | ORAL_TABLET | Freq: Two times a day (BID) | ORAL | Status: DC
  Administered 2017-12-07: 09:00:00 1 via ORAL
  Filled 2017-12-07: qty 1

## 2017-12-07 MED ORDER — PREDNISONE 10 MG PO TABS: 40.0000 mg | ORAL_TABLET | Freq: Every day | ORAL | 0 refills | Status: DC

## 2017-12-07 NOTE — Discharge Summary (Signed)
[facility] NAME: Larry Watkins    MR#:  [id number]  DATE OF BIRTH:  [birth year]  DATE OF ADMISSION:  12/05/2017   ADMITTING PHYSICIAN: Gladstone Lighter, MD  DATE OF DISCHARGE: 12/07/2017  9:46 AM  PRIMARY CARE PHYSICIAN: Earlie Server, MD   ADMISSION DIAGNOSIS:  Collapsed lung [J98.19] Community acquired pneumonia of right middle lobe of lung ([place]) [J18.1] DISCHARGE DIAGNOSIS:  Active Problems:   Pneumonia  SECONDARY DIAGNOSIS:   Past Medical History:  Diagnosis Date  . Bone metastasis ([place]) 07/17/2017  . Bone metastasis ([place]) 07/17/2017  . Metastatic renal cell carcinoma [facility])    HOSPITAL COURSE:   Larry Watkins a56 y.o.malewith a known history of metastatic renal cell carcinoma diagnosed about 5 months ago with metastases to lung and right hip presents to the hospital secondary to worsening shortness of breath, wheezing and cough.  1. Postobstructive pneumonia Bronchoscopy may NOT help at this time as dx already established per Dr. Mortimer Fries. Patient was treated with Zosyn, IV Solu-Medrol, duo nebs for his bronchospasm. Changed to p.o. Augmentin and prednisone.  2. Metastatic renal cell carcinoma-diagnosed at the end of July 2018 with a right kidney mass and also has abdominal lymphadenopathy, lung nodules andosseousmetastases. -Status post radiation to his right hip and right lung. Per Dr. Tasia Catchings, hold immunotherapy, continue patient on steroids for treatment of potential radiation or immunotherapy associated pneumonitis. can taper steroid course slowly outpatient.  3.Tobacco use disorder Smoking cessation was counseled for 4 minutes.  Nicotine patch. DISCHARGE CONDITIONS:  Stable, discharged to home today. CONSULTS OBTAINED:  Treatment Team:  Earlie Server, MD Flora Lipps, MD DRUG ALLERGIES:  No Known Allergies DISCHARGE MEDICATIONS:   Allergies as of 12/07/2017   No Known Allergies     Medication List    TAKE  these medications   albuterol 108 (90 Base) MCG/ACT inhaler Commonly known as:  PROVENTIL HFA;VENTOLIN HFA Inhale 2 puffs into the lungs every 6 (six) hours as needed for wheezing or shortness of breath.   amoxicillin-clavulanate 875-125 MG tablet Commonly known as:  AUGMENTIN Take 1 tablet by mouth every 12 (twelve) hours.   benzonatate 200 MG capsule Commonly known as:  TESSALON Take 1 capsule (200 mg total) by mouth 3 (three) times daily as needed for cough.   gabapentin 300 MG capsule Commonly known as:  NEURONTIN Take 1 capsule (300 mg total) by mouth 3 (three) times daily. What changed:    how much to take  when to take this   morphine 30 MG 12 hr tablet Commonly known as:  MS CONTIN Take 1 tablet (30 mg total) by mouth every 12 (twelve) hours for 14 days.   ondansetron 4 MG disintegrating tablet Commonly known as:  ZOFRAN ODT Take 1 tablet (4 mg total) by mouth every 8 (eight) hours as needed for nausea or vomiting.   oxyCODONE-acetaminophen 10-325 MG tablet Commonly known as:  PERCOCET Take 1 tablet by mouth every 12 (twelve) hours as needed for up to 14 days for pain.   predniSONE 10 MG tablet Commonly known as:  DELTASONE Take 4 tablets (40 mg total) by mouth daily with breakfast.        DISCHARGE INSTRUCTIONS:  See AVS.  If you experience worsening of your admission symptoms, develop shortness of breath, life threatening emergency, suicidal or homicidal thoughts you must seek medical attention immediately by calling 911 or calling your MD immediately  if symptoms less severe.  You Must read complete  instructions/literature along with all the possible adverse reactions/side effects for all the Medicines you take and that have been prescribed to you. Take any new Medicines after you have completely understood and accpet all the possible adverse reactions/side effects.   Please note  You were cared for by a hospitalist during your hospital stay. If you have  any questions about your discharge medications or the care you received while you were in the hospital after you are discharged, you can call the unit and asked to speak with the hospitalist on call if the hospitalist that took care of you is not available. Once you are discharged, your primary care physician will handle any further medical issues. Please note that NO REFILLS for any discharge medications will be authorized once you are discharged, as it is imperative that you return to your primary care physician (or establish a relationship with a primary care physician if you do not have one) for your aftercare needs so that they can reassess your need for medications and monitor your lab values.    On the day of Discharge:  VITAL SIGNS:  Blood pressure 106/60, pulse 72, temperature 97.9 F (36.6 C), temperature source Oral, resp. rate 16, height 5\' 7"  (1.702 m), weight 220 lb (99.8 kg), SpO2 95 %. PHYSICAL EXAMINATION:  GENERAL:  57 y.o.-year-old patient lying in the bed with no acute distress.  EYES: Pupils equal, round, reactive to light and accommodation. No scleral icterus. Extraocular muscles intact.  HEENT: Head atraumatic, normocephalic. Oropharynx and nasopharynx clear.  NECK:  Supple, no jugular venous distention. No thyroid enlargement, no tenderness.  LUNGS: Normal breath sounds bilaterally, no wheezing, rales,rhonchi or crepitation. No use of accessory muscles of respiration.  CARDIOVASCULAR: S1, S2 normal. No murmurs, rubs, or gallops.  ABDOMEN: Soft, non-tender, non-distended. Bowel sounds present. No organomegaly or mass.  EXTREMITIES: No pedal edema, cyanosis, or clubbing.  NEUROLOGIC: Cranial nerves II through XII are intact. Muscle strength 5/5 in all extremities. Sensation intact. Gait not checked.  PSYCHIATRIC: The patient is alert and oriented x 3.  SKIN: No obvious rash, lesion, or ulcer.  DATA REVIEW:   CBC Recent Labs  Lab 12/06/17 0412  WBC 8.2  HGB 11.6*  HCT  34.7*  PLT 305    Chemistries  Recent Labs  Lab 12/06/17 0412  NA 137  K 4.1  CL 104  CO2 23  GLUCOSE 154*  BUN 13  CREATININE 0.72  CALCIUM 8.5*     Microbiology Results  Results for orders placed or performed during the hospital encounter of 12/05/17  Blood culture (routine x 2)     Status: None (Preliminary result)   Collection Time: 12/05/17 11:10 AM  Result Value Ref Range Status   Specimen Description BLOOD LEFT ANTECUBITAL  Final   Special Requests   Final    BOTTLES DRAWN AEROBIC AND ANAEROBIC Blood Culture results may not be optimal due to an excessive volume of blood received in culture bottles   Culture   Final    NO GROWTH 2 DAYS Performed at [facility], [address], [place], [place] [address]    Report Status PENDING  Incomplete  Blood culture (routine x 2)     Status: None (Preliminary result)   Collection Time: 12/05/17 11:19 AM  Result Value Ref Range Status   Specimen Description BLOOD RIGHT HAND  Final   Special Requests   Final    BOTTLES DRAWN AEROBIC AND ANAEROBIC Blood Culture results may not be optimal  due to an inadequate volume of blood received in culture bottles   Culture   Final    NO GROWTH 2 DAYS Performed at [facility], [address] [place], [place] [address]    Report Status PENDING  Incomplete  MRSA PCR Screening     Status: None   Collection Time: 12/05/17  2:13 PM  Result Value Ref Range Status   MRSA by PCR NEGATIVE NEGATIVE Final    Comment:        The GeneXpert MRSA Assay (FDA approved for NASAL specimens only), is one component of a comprehensive MRSA colonization surveillance program. It is not intended to diagnose MRSA infection nor to guide or monitor treatment for MRSA infections. Performed at [facility], [address] [place], [place] [address]     RADIOLOGY:  No results found.   Management plans discussed with the patient, family and they are in agreement.  CODE  STATUS: Prior   TOTAL TIME TAKING CARE OF THIS PATIENT: 32 minutes.    Demetrios Loll M.D on 12/07/2017 at 1:21 PM  Between 7am to 6pm - Pager - [phone]  After 6pm go to [email or web address] - [employer] [facility]  Office  [phone]  CC: Primary care physician; Earlie Server, MD   Note: This dictation was prepared with Dragon dictation along with smaller phrase technology. Any transcriptional errors that result from this process are unintentional.

## 2017-12-07 NOTE — Progress Notes (Signed)
D/C orders and prescription received from Dr. Bridgett Larsson. D/C instructions reviewed w/ the patient, along w/ prescriptions for Augmentin and Prednisone. He verbalized his understanding on instructions, as well as follow-up information. Patient already has appointment made for same this coming Wednesday. Instructions signed by the patient. The patient was taken to visitor's entrance for D/C via wheelchair by [place], [place].

## 2017-12-09 NOTE — Progress Notes (Signed)
[facility] follow up visit  Date of visit: 12/09/17 REASON FOR VISIT Follow up for treatment of metastatic renal cell cancer.  HISTORY OF PRESENT DISEASE/ PERTINENT ONCOLOGY HISTORY 1[name] is  57 y.o. male with 40 pack year smoking histroy is here for evaluation of abnormal image finding. Patient has had right shoulder pain  three months after reaching below a bar to grab a glass. Patient is not able to raise his right arm over shoulder level.  Also having left flank pain associated with pink urine for a few days and resolved after taking a course of amoxicillin. Patient has 40 pack year smoking history.    2 Images:  06/26/2017 CT scan showed a concerning right kidney mass with possible metastatic disease with thoracic and abdominal lymphadenopathy, lung nodules, and diffuse osseous metastases. 07/15/2017 CT PE protocol for evaluation of shortness of breath or chest pain showed no PE, however right lung collapse and rapid progression of lung adenopathy.  08/10/2017 ED visit due to uncontrolled pain: CT hip showed lucent bone lesions in the left hip and left acetabulum consistent with known metastatic disease. No evidence of pathological fracture.  08/11/2017 Bone scan: FINDINGS:Numerous areas of abnormal bony uptake noted in the skull, bilateral scapulae, sternum, cervical and thoracic spine, posterior left tenth rib, and right iliac bone compatible with metastatic disease. Degenerative uptake in the ankles and feet. Soft tissue activity unremarkable. IMPRESSION:Extensive osseous metastatic disease as above.   3 Pathology:     07/10/2017 07/24/2017 A. Lung, right upper lobe, endobronchial biopsy DIAGNOSIS: A. LUNG, RIGHT UPPER LOBE; ENDOBRONCHIAL BIOPSY: - METASTATIC CARCINOMA CONSISTENT WITH CLEAR CELL RENAL CELL CARCINOMA    Bronchoscopy on 07/24/2017 and biopsy revealed metastatic renal cell cancer.   4 Treatment 07/22/2017. nivolumab and ipilimumab, s/p 4 cycles.  S/p  palliative RT to lung and involved bone mets     5 Pain medication:  Patient finished one month supply of percocet in about one week. So his pain medication Rx are being dispensed weekly and he gets one week supply at one time. Multiple discussion about importance of taking pain medication as instructed.  During one clinic visit, patient was on the phone talking to someone and RN overheard that he said "I am right now at [place] office for the RX, get the money ready".    INTERVAL HISTORY  Larry Watkins 57 y.o.  male with above Oncology history present for follow-up after recent hospitalization. Patient presented to emergency room last week due to chest pain, shortness of breath. Chest x-ray showed obstructive pneumonia, questionable radiation changes. Patient was given IV antibiotics. As patient has been on immunotherapy, and  Immune pneumonitis is also in the differential. Patient was given steroids in the hospital and continued on 40 mg of prednisone on discharge. Today he feels his cough, shortness of breath, chest pain has significantly improved. He no longer has any wheezing. Patient reports feeling severe right mid thigh pain. Reports the pain started yesterday when he woke up, denies any injury. The pain is sharp stabbing, 10 out of 10. He has to take long-acting and short-acting pain medications for pain relief.  Review of Systems  Constitutional: Negative for chills, fever, malaise/fatigue and weight loss.  HENT: Negative for ear pain and tinnitus.   Eyes: Negative for blurred vision and pain.  Respiratory: Positive for cough and sputum production. Negative for hemoptysis, shortness of breath and wheezing.   Cardiovascular: Negative for chest pain and palpitations.  Gastrointestinal:  Positive for heartburn. Negative for abdominal pain, diarrhea and nausea.       Right lower quadrant  Genitourinary: Negative for dysuria and flank pain.  Musculoskeletal: Positive for joint pain.  Negative for back pain and neck pain.       Right hip pain improves. Severe right mid thigh pain.  Skin: Negative for itching and rash.  Neurological: Negative for dizziness, tingling and weakness.  Endo/Heme/Allergies: Does not bruise/bleed easily.  Psychiatric/Behavioral: Negative for suicidal ideas. The patient is not nervous/anxious.      MEDICAL HISTORY: Past Medical History:  Diagnosis Date  . Bone metastasis ([place]) 07/17/2017  . Bone metastasis ([place]) 07/17/2017  . Metastatic renal cell carcinoma ([place])     SURGICAL HISTORY: Past Surgical History:  Procedure Laterality Date  . FLEXIBLE BRONCHOSCOPY N/A 07/24/2017   Procedure: FLEXIBLE BRONCHOSCOPY;  Surgeon: [name], MD;  Location: [facility] ORS;  Service: Pulmonary;  Laterality: N/A;  . HERNIA REPAIR    . left middle amputation      with reattachment    SOCIAL HISTORY: Social History   Socioeconomic History  . Marital status: Single    Spouse name: Not on file  . Number of children: Not on file  . Years of education: Not on file  . Highest education level: Not on file  Social Needs  . Financial resource strain: Not on file  . Food insecurity - worry: Not on file  . Food insecurity - inability: Not on file  . Transportation needs - medical: Not on file  . Transportation needs - non-medical: Not on file  Occupational History  . Not on file  Tobacco Use  . Smoking status: Current Every Day Smoker    Packs/day: 0.25    Years: 38.00    Pack years: 9.50  . Smokeless tobacco: Never Used  Substance and Sexual Activity  . Alcohol use: Yes    Comment: 1-2 beers, mixed drinks on weekends  . Drug use: No  . Sexual activity: Not on file  Other Topics Concern  . Not on file  Social History Narrative   Independent at baseline.    FAMILY HISTORY Family History  Problem Relation Age of Onset  . Cancer Paternal Uncle        brain    ALLERGIES:  has No Known Allergies.  MEDICATIONS:  Current Outpatient  Medications  Medication Sig Dispense Refill  . albuterol (PROVENTIL HFA;VENTOLIN HFA) 108 (90 Base) MCG/ACT inhaler Inhale 2 puffs into the lungs every 6 (six) hours as needed for wheezing or shortness of breath. (Patient not taking: Reported on 11/26/2017) 1 Inhaler 2  . amoxicillin-clavulanate (AUGMENTIN) 875-125 MG tablet Take 1 tablet by mouth every 12 (twelve) hours. 10 tablet 0  . benzonatate (TESSALON) 200 MG capsule Take 1 capsule (200 mg total) by mouth 3 (three) times daily as needed for cough. 30 capsule 0  . gabapentin (NEURONTIN) 300 MG capsule Take 1 capsule (300 mg total) by mouth 3 (three) times daily. (Patient taking differently: Take 600 mg by mouth 2 (two) times daily. ) 90 capsule 0  . morphine (MS CONTIN) 30 MG 12 hr tablet Take 1 tablet (30 mg total) by mouth every 12 (twelve) hours for 14 days. 28 tablet 0  . ondansetron (ZOFRAN ODT) 4 MG disintegrating tablet Take 1 tablet (4 mg total) by mouth every 8 (eight) hours as needed for nausea or vomiting. 20 tablet 0  . oxyCODONE-acetaminophen (PERCOCET) 10-325 MG tablet Take 1 tablet by mouth every  12 (twelve) hours as needed for up to 14 days for pain. 28 tablet 0  . predniSONE (DELTASONE) 10 MG tablet Take 4 tablets (40 mg total) by mouth daily with breakfast. 28 tablet 0   No current facility-administered medications for this visit.    Vitals:   12/10/17 1053  BP: (!) 146/90  Pulse: 89  Resp: 20  Temp: 97.6 F (36.4 C)   Filed Weights   12/10/17 1117  Weight: 219 lb (99.3 kg)   Physical Exam  Constitutional: He is oriented to person, place, and time. He appears well-developed. No distress.  HENT:  Head: Normocephalic and atraumatic.  Mouth/Throat: No oropharyngeal exudate.  Eyes: EOM are normal. Pupils are equal, round, and reactive to light. Left eye exhibits no discharge.  Neck: Neck supple. No thyromegaly present.  Cardiovascular: Normal rate, regular rhythm and normal heart sounds. Exam reveals no friction  rub.  No murmur heard. Pulmonary/Chest: Effort normal. He has no wheezes. He exhibits no tenderness.  Abdominal: Soft. Bowel sounds are normal. He exhibits no distension and no mass.  Musculoskeletal: Normal range of motion. He exhibits no edema or deformity.  Neurological: He is alert and oriented to person, place, and time. No cranial nerve deficit. Coordination normal.  Skin: Skin is warm and dry. No rash noted. No erythema.  Psychiatric: He has a normal mood and affect.    LABORATORY DATA: I have personally reviewed the data as listed: CBC    Component Value Date/Time   WBC 8.2 12/06/2017 0412   RBC 4.04 (L) 12/06/2017 0412   HGB 11.6 (L) 12/06/2017 0412   HCT 34.7 (L) 12/06/2017 0412   PLT 305 12/06/2017 0412   MCV 85.9 12/06/2017 0412   MCH 28.6 12/06/2017 0412   MCHC 33.3 12/06/2017 0412   RDW 14.4 12/06/2017 0412   LYMPHSABS 1.2 11/26/2017 1107   MONOABS 0.5 11/26/2017 1107   EOSABS 0.1 11/26/2017 1107   BASOSABS 0.0 11/26/2017 1107   CMP Latest Ref Rng & Units 12/06/2017 12/05/2017 11/26/2017  Glucose 65 - 99 mg/dL 154(H) 129(H) 110(H)  BUN 6 - 20 mg/dL _0 Creatinine 0.61 - 1.24 mg/dL 0.72 0.95 0.88  Sodium 135 - 145 mmol/L 137 137 137  Potassium 3.5 - 5.1 mmol/L 4.1 4.1 4.3  Chloride 101 - 111 mmol/L 104 100(L) 100(L)  CO2 22 - 32 mmol/L _1 Calcium 8.9 - 10.3 mg/dL 8.5(L) 9.5 8.9  Total Protein 6.5 - 8.1 g/dL - - 7.5  Total Bilirubin 0.3 - 1.2 mg/dL - - 0.2(L)  Alkaline Phos 38 - 126 U/L - - 96  AST 15 - 41 U/L - - 16  ALT 17 - 63 U/L - - 17    RADIOGRAPHIC STUDIES: I have personally reviewed the radiological images as listed and agree with the findings in the report CT chest abdomen pelvis 06/26/2017  IMPRESSION: 1. 7 cm right lower pole renal mass. This may reflect urothelial carcinoma given involvement of the collecting system versus renal cell carcinoma. 2. Metastatic disease with thoracic and abdominal lymphadenopathy, lung nodules, and  diffuse osseous metastases. 3. Subcentimeter lesion in the right upper lobe bronchus with diffuse abnormal right upper lobe bronchial wall soft tissue thickening.  07/15/2017 No evidence of pulmonary emboli. Attenuation of the pulmonary arterial branches is noted related to hilar adenopathy. Increase in the size of the central right hilar mass with increase in mediastinal and hilar adenopathy when compared with the recent exam. New right upper lobe  collapse secondary to bronchial occlusion.Additionally some patchy infiltrative changes noted in the lower lobe likely related to the acute infiltrate. Diffuse bony metastatic disease with evidence of pathologic fracture involving the left tenth rib lesion.  Pathology 07/10/2017 Surgical Pathology  CASE: [id number]  PATIENT: Nora Tieszen  Surgical Pathology Report  DIAGNOSIS:  A. LYTIC BONE LESION, RIGHT ILIAC CREST; CT-GUIDED BIOPSY:  - METASTATIC CARCINOMA, PAX-8 POSITIVE, COMPATIBLE WITH METASTATIC CLEAR  CELL RENAL CELL CARCINOMA.    Surgical Pathology 07/24/2017 CASE: [id number]  SPECIMEN SUBMITTED:  A. Lung, right upper lobe, endobronchial biopsy  DIAGNOSIS:  A. LUNG, RIGHT UPPER LOBE; ENDOBRONCHIAL BIOPSY:  - METASTATIC CARCINOMA CONSISTENT WITH CLEAR CELL RENAL CELL CARCINOMA  07/24/2017 Cytology - Non PAP  CASE: [id number]  SPECIMEN SUBMITTED:  A. Lung, right upper lobe; bronchoscopy brushing  DIAGNOSIS:  A. LUNG, RIGHT UPPER LOBE; BRONCHOSCOPY BRUSHING:  - POSITIVE FOR MALIGNANCY.  - METASTATIC CARCINOMA CONSISTENT WITH CLEAR CELL RENAL CELL CARCINOMA  ASSESSMENT/PLAN Cancer Staging Renal cell carcinoma of right kidney [facility]) Staging form: Kidney, AJCC 8th Edition - Clinical stage from 07/14/2017: Stage IV (cT3a, cNX, pM1) - Signed by Earlie Server, MD on 07/15/2017 - Pathologic: No stage assigned - Unsigned  1. Metastatic renal cell carcinoma, unspecified laterality ([place])   2. Bone metastasis ([place])   3. Pneumonitis     #Clinically his respiratory symptoms has significantly improved. We'll continue steroids with very slow taper. He previously failed a short taper course.prescription sent for prednisone 30 mg daily for 7 days. # Hold immunotherapy.  # right hip pain, pending MRI workup. # new right mid femur pain stat chest x-ray. # Refilled his long-acting MS Contin 30 mg every 12 hours, increase Percocet toevery 6 hours as needed. Dispense 2 weeks supply. Follow in 1 week for evaluation.  All questions were answered. The patient knows to call the clinic with any problems, questions or concerns.Earlie Server, MD, PhD Hematology Oncology [facility] Pager- [phone] 12/10/2017

## 2017-12-10 ENCOUNTER — Telehealth: Payer: Self-pay | Admitting: *Deleted

## 2017-12-10 ENCOUNTER — Encounter: Payer: Self-pay | Admitting: Oncology

## 2017-12-10 ENCOUNTER — Inpatient Hospital Stay: Payer: Medicaid Other

## 2017-12-10 ENCOUNTER — Inpatient Hospital Stay: Payer: Medicaid Other | Attending: Oncology

## 2017-12-10 ENCOUNTER — Inpatient Hospital Stay (HOSPITAL_BASED_OUTPATIENT_CLINIC_OR_DEPARTMENT_OTHER): Payer: Medicaid Other | Admitting: Oncology

## 2017-12-10 VITALS — BP 146/90 | HR 89 | Temp 97.6°F | Resp 20 | Wt 219.0 lb

## 2017-12-10 DIAGNOSIS — J189 Pneumonia, unspecified organism: Secondary | ICD-10-CM | POA: Diagnosis not present

## 2017-12-10 DIAGNOSIS — J984 Other disorders of lung: Secondary | ICD-10-CM

## 2017-12-10 DIAGNOSIS — C649 Malignant neoplasm of unspecified kidney, except renal pelvis: Secondary | ICD-10-CM | POA: Insufficient documentation

## 2017-12-10 DIAGNOSIS — Z720 Tobacco use: Secondary | ICD-10-CM

## 2017-12-10 DIAGNOSIS — R0602 Shortness of breath: Secondary | ICD-10-CM | POA: Insufficient documentation

## 2017-12-10 DIAGNOSIS — M25859 Other specified joint disorders, unspecified hip: Secondary | ICD-10-CM | POA: Diagnosis not present

## 2017-12-10 DIAGNOSIS — G893 Neoplasm related pain (acute) (chronic): Secondary | ICD-10-CM | POA: Insufficient documentation

## 2017-12-10 DIAGNOSIS — M25551 Pain in right hip: Secondary | ICD-10-CM

## 2017-12-10 DIAGNOSIS — R0789 Other chest pain: Secondary | ICD-10-CM | POA: Insufficient documentation

## 2017-12-10 DIAGNOSIS — Z79899 Other long term (current) drug therapy: Secondary | ICD-10-CM | POA: Diagnosis not present

## 2017-12-10 DIAGNOSIS — C7989 Secondary malignant neoplasm of other specified sites: Secondary | ICD-10-CM | POA: Diagnosis not present

## 2017-12-10 DIAGNOSIS — R319 Hematuria, unspecified: Secondary | ICD-10-CM | POA: Insufficient documentation

## 2017-12-10 DIAGNOSIS — C7951 Secondary malignant neoplasm of bone: Secondary | ICD-10-CM | POA: Insufficient documentation

## 2017-12-10 LAB — CULTURE, BLOOD (ROUTINE X 2)
CULTURE: NO GROWTH
Culture: NO GROWTH

## 2017-12-10 LAB — COMPREHENSIVE METABOLIC PANEL
ALBUMIN: 3.3 g/dL — AB (ref 3.5–5.0)
ALK PHOS: 86 U/L (ref 38–126)
ALT: 21 U/L (ref 17–63)
AST: 13 U/L — ABNORMAL LOW (ref 15–41)
Anion gap: 10 (ref 5–15)
BUN: 22 mg/dL — ABNORMAL HIGH (ref 6–20)
CO2: 26 mmol/L (ref 22–32)
CREATININE: 0.88 mg/dL (ref 0.61–1.24)
Calcium: 8.6 mg/dL — ABNORMAL LOW (ref 8.9–10.3)
Chloride: 101 mmol/L (ref 101–111)
GFR calc Af Amer: >60 mL/min (ref >60)
GFR calc non Af Amer: >60 mL/min (ref >60)
GLUCOSE: 116 mg/dL — AB (ref 65–99)
Potassium: 3.9 mmol/L (ref 3.5–5.1)
SODIUM: 137 mmol/L (ref 135–145)
Total Bilirubin: 0.5 mg/dL (ref 0.3–1.2)
Total Protein: 7.3 g/dL (ref 6.5–8.1)

## 2017-12-10 LAB — CBC WITH DIFFERENTIAL/PLATELET
BASOS PCT: 0 %
Basophils Absolute: 0 10*3/uL (ref 0–0.1)
EOS ABS: 0 10*3/uL (ref 0–0.7)
Eosinophils Relative: 1 %
HEMATOCRIT: 38.7 % — AB (ref 40.0–52.0)
Hemoglobin: 13 g/dL (ref 13.0–18.0)
Lymphocytes Relative: 17 %
Lymphs Abs: 1.3 10*3/uL (ref 1.0–3.6)
MCH: 29 pg (ref 26.0–34.0)
MCHC: 33.6 g/dL (ref 32.0–36.0)
MCV: 86 fL (ref 80.0–100.0)
MONO ABS: 0.7 10*3/uL (ref 0.2–1.0)
MONOS PCT: 10 %
NEUTROS ABS: 5.3 10*3/uL (ref 1.4–6.5)
Neutrophils Relative %: 72 %
Platelets: 427 10*3/uL (ref 150–440)
RBC: 4.5 MIL/uL (ref 4.40–5.90)
RDW: 14.7 % — AB (ref 11.5–14.5)
WBC: 7.4 10*3/uL (ref 3.8–10.6)

## 2017-12-10 LAB — TSH: TSH: 3.863 u[IU]/mL (ref 0.350–4.500)

## 2017-12-10 MED ORDER — ONDANSETRON 4 MG PO TBDP: 4.0000 mg | ORAL_TABLET | Freq: Three times a day (TID) | ORAL | 3 refills | Status: DC | PRN

## 2017-12-10 MED ORDER — MORPHINE SULFATE ER 30 MG PO TBCR: 30.0000 mg | EXTENDED_RELEASE_TABLET | Freq: Two times a day (BID) | ORAL | 0 refills | Status: DC

## 2017-12-10 MED ORDER — OXYCODONE-ACETAMINOPHEN 10-325 MG PO TABS: 1.0000 | ORAL_TABLET | Freq: Four times a day (QID) | ORAL | 0 refills | Status: DC | PRN

## 2017-12-10 MED ORDER — PREDNISONE 10 MG PO TABS: 30.0000 mg | ORAL_TABLET | Freq: Every day | ORAL | 0 refills | Status: DC

## 2017-12-10 NOTE — Telephone Encounter (Signed)
Patient left without getting a copy of his schedule Patient was called and was made aware his MRI scheduled for [facility] 12/15/17/ @ 1:00 and also to RTC in  1 week per 12/10/17 los

## 2017-12-15 ENCOUNTER — Ambulatory Visit
Admission: RE | Admit: 2017-12-15 | Discharge: 2017-12-15 | Disposition: A | Discharge status: Home / Self Care | Payer: Medicaid Other | Source: Ambulatory Visit | Attending: Oncology | Admitting: Oncology

## 2017-12-15 DIAGNOSIS — C7951 Secondary malignant neoplasm of bone: Secondary | ICD-10-CM | POA: Insufficient documentation

## 2017-12-15 DIAGNOSIS — R05 Cough: Secondary | ICD-10-CM | POA: Insufficient documentation

## 2017-12-15 DIAGNOSIS — C799 Secondary malignant neoplasm of unspecified site: Secondary | ICD-10-CM

## 2017-12-15 DIAGNOSIS — C641 Malignant neoplasm of right kidney, except renal pelvis: Secondary | ICD-10-CM | POA: Insufficient documentation

## 2017-12-15 DIAGNOSIS — R059 Cough, unspecified: Secondary | ICD-10-CM

## 2017-12-15 MED ORDER — GADOBENATE DIMEGLUMINE 529 MG/ML IV SOLN
20.0000 mL | Freq: Once | INTRAVENOUS | Status: AC | PRN
  Administered 2017-12-15: 20 mL via INTRAVENOUS

## 2017-12-16 NOTE — Progress Notes (Signed)
[facility] follow up visit  Date of visit: 12/16/17 REASON FOR VISIT Follow up for treatment of metastatic renal cell cancer.  HISTORY OF PRESENT DISEASE/ PERTINENT ONCOLOGY HISTORY 1[name] is  57 y.o. male with 40 pack year smoking histroy is here for evaluation of abnormal image finding. Patient has had right shoulder pain  three months after reaching below a bar to grab a glass. Patient is not able to raise his right arm over shoulder level.  Also having left flank pain associated with pink urine for a few days and resolved after taking a course of amoxicillin. Patient has 40 pack year smoking history.    2 Images:  06/26/2017 CT scan showed a concerning right kidney mass with possible metastatic disease with thoracic and abdominal lymphadenopathy, lung nodules, and diffuse osseous metastases. 07/15/2017 CT PE protocol for evaluation of shortness of breath or chest pain showed no PE, however right lung collapse and rapid progression of lung adenopathy.  08/10/2017 ED visit due to uncontrolled pain: CT hip showed lucent bone lesions in the left hip and left acetabulum consistent with known metastatic disease. No evidence of pathological fracture.  08/11/2017 Bone scan: FINDINGS:Numerous areas of abnormal bony uptake noted in the skull, bilateral scapulae, sternum, cervical and thoracic spine, posterior left tenth rib, and right iliac bone compatible with metastatic disease. Degenerative uptake in the ankles and feet. Soft tissue activity unremarkable. IMPRESSION:Extensive osseous metastatic disease as above.   3 Pathology:     07/10/2017 07/24/2017 A. Lung, right upper lobe, endobronchial biopsy DIAGNOSIS: A. LUNG, RIGHT UPPER LOBE; ENDOBRONCHIAL BIOPSY: - METASTATIC CARCINOMA CONSISTENT WITH CLEAR CELL RENAL CELL CARCINOMA    Bronchoscopy on 07/24/2017 and biopsy revealed metastatic renal cell cancer.   4 Treatment 07/22/2017. nivolumab and ipilimumab, s/p 4 cycles.  S/p  palliative RT to lung and involved bone mets     5 Pain medication:  Patient finished one month supply of percocet in about one week. So his pain medication Rx are being dispensed weekly and he gets one week supply at one time. Multiple discussion about importance of taking pain medication as instructed.  During one clinic visit, patient was on the phone talking to someone and RN overheard that he said "I am right now at [facility] office for the RX, get the money ready".    INTERVAL HISTORY  Larry Watkins 57 y.o.  male with above Oncology history present for follow-up for kidney cancer. Patient has been taking 30 mg of steroids for the past week. He reports that her breathing has significantly improved and now back to baseline. Denies any wheezing or shortness of breath. His biggest problem right now is persistent right femur pain. He takes long-acting narcotics twice a day and has been taking Percocet around the clock. Pain it's 10 out of 10, sharp, located at upper middle of his right femur. His appetite is good while on steroids and he has gained weight. Review of Systems  Constitutional: Negative for fever, malaise/fatigue and weight loss.  HENT: Negative for ear pain, hearing loss, nosebleeds and tinnitus.   Eyes: Negative for blurred vision, double vision and pain.  Respiratory: Negative for cough, sputum production and wheezing.   Cardiovascular: Negative for chest pain and palpitations.  Gastrointestinal: Negative for abdominal pain, diarrhea, heartburn and nausea.       Right lower quadrant  Genitourinary: Negative for dysuria, flank pain and urgency.  Musculoskeletal: Negative for back pain, joint pain and neck pain.  Right hip pain improves. Severe right mid thigh pain.  Skin: Negative for itching and rash.  Neurological: Negative for dizziness, tingling and weakness.  Endo/Heme/Allergies: Does not bruise/bleed easily.  Psychiatric/Behavioral: Negative for suicidal ideas.  The patient is not nervous/anxious.      MEDICAL HISTORY: Past Medical History:  Diagnosis Date  . Bone metastasis ([place]) 07/17/2017  . Bone metastasis ([place]) 07/17/2017  . Metastatic renal cell carcinoma ([place])     SURGICAL HISTORY: Past Surgical History:  Procedure Laterality Date  . FLEXIBLE BRONCHOSCOPY N/A 07/24/2017   Procedure: FLEXIBLE BRONCHOSCOPY;  Surgeon: [name], MD;  Location: [facility] ORS;  Service: Pulmonary;  Laterality: N/A;  . HERNIA REPAIR    . left middle amputation      with reattachment    SOCIAL HISTORY: Social History   Socioeconomic History  . Marital status: Single    Spouse name: Not on file  . Number of children: Not on file  . Years of education: Not on file  . Highest education level: Not on file  Social Needs  . Financial resource strain: Not on file  . Food insecurity - worry: Not on file  . Food insecurity - inability: Not on file  . Transportation needs - medical: Not on file  . Transportation needs - non-medical: Not on file  Occupational History  . Not on file  Tobacco Use  . Smoking status: Current Every Day Smoker    Packs/day: 0.25    Years: 38.00    Pack years: 9.50  . Smokeless tobacco: Never Used  Substance and Sexual Activity  . Alcohol use: Yes    Comment: 1-2 beers, mixed drinks on weekends  . Drug use: No  . Sexual activity: Not on file  Other Topics Concern  . Not on file  Social History Narrative   Independent at baseline.    FAMILY HISTORY Family History  Problem Relation Age of Onset  . Cancer Paternal Uncle        brain    ALLERGIES:  has No Known Allergies.  MEDICATIONS:  Current Outpatient Medications  Medication Sig Dispense Refill  . albuterol (PROVENTIL HFA;VENTOLIN HFA) 108 (90 Base) MCG/ACT inhaler Inhale 2 puffs into the lungs every 6 (six) hours as needed for wheezing or shortness of breath. 1 Inhaler 2  . amoxicillin-clavulanate (AUGMENTIN) 875-125 MG tablet Take 1 tablet by mouth every  12 (twelve) hours. 10 tablet 0  . benzonatate (TESSALON) 200 MG capsule Take 1 capsule (200 mg total) by mouth 3 (three) times daily as needed for cough. 30 capsule 0  . gabapentin (NEURONTIN) 300 MG capsule Take 1 capsule (300 mg total) by mouth 3 (three) times daily. (Patient taking differently: Take 600 mg by mouth 2 (two) times daily. ) 90 capsule 0  . morphine (MS CONTIN) 30 MG 12 hr tablet Take 1 tablet (30 mg total) by mouth every 12 (twelve) hours for 14 days. 28 tablet 0  . ondansetron (ZOFRAN ODT) 4 MG disintegrating tablet Take 1 tablet (4 mg total) by mouth every 8 (eight) hours as needed for nausea or vomiting. 20 tablet 3  . oxyCODONE-acetaminophen (PERCOCET) 10-325 MG tablet Take 1 tablet by mouth every 6 (six) hours as needed for up to 14 days for pain. 56 tablet 0  . predniSONE (DELTASONE) 10 MG tablet Take 3 tablets (30 mg total) by mouth daily with breakfast. 21 tablet 0   No current facility-administered medications for this visit.    Vitals:   12/17/17  0949  BP: 127/82  Pulse: 93  Resp: 16  Temp: 98.9 F (37.2 C)   Filed Weights   12/17/17 0949  Weight: 223 lb (101.2 kg)   Physical Exam  Constitutional: He is oriented to person, place, and time. He appears well-developed.  HENT:  Head: Normocephalic and atraumatic.  Eyes: EOM are normal. Pupils are equal, round, and reactive to light. Left eye exhibits no discharge.  Neck: Neck supple. No JVD present. No thyromegaly present.  Cardiovascular: Normal rate, regular rhythm and normal heart sounds. Exam reveals no friction rub.  No murmur heard. Pulmonary/Chest: Effort normal and breath sounds normal. No respiratory distress. He has no wheezes. He exhibits no tenderness.  Abdominal: Soft. Bowel sounds are normal. He exhibits no mass.  Musculoskeletal: Normal range of motion. He exhibits no edema.  Lymphadenopathy:    He has no cervical adenopathy.  Neurological: He is alert and oriented to person, place, and time.  No cranial nerve deficit. Coordination normal.  Skin: Skin is warm and dry. No rash noted. No erythema. No pallor.  Psychiatric: He has a normal mood and affect.    LABORATORY DATA: I have personally reviewed the data as listed: CBC    Component Value Date/Time   WBC 7.4 12/10/2017 1035   RBC 4.50 12/10/2017 1035   HGB 13.0 12/10/2017 1035   HCT 38.7 (L) 12/10/2017 1035   PLT 427 12/10/2017 1035   MCV 86.0 12/10/2017 1035   MCH 29.0 12/10/2017 1035   MCHC 33.6 12/10/2017 1035   RDW 14.7 (H) 12/10/2017 1035   LYMPHSABS 1.3 12/10/2017 1035   MONOABS 0.7 12/10/2017 1035   EOSABS 0.0 12/10/2017 1035   BASOSABS 0.0 12/10/2017 1035   CMP Latest Ref Rng & Units 12/10/2017 12/06/2017 12/05/2017  Glucose 65 - 99 mg/dL 116(H) 154(H) 129(H)  BUN 6 - 20 mg/dL 22(H) 13 10  Creatinine 0.61 - 1.24 mg/dL 0.88 0.72 0.95  Sodium 135 - 145 mmol/L 137 137 137  Potassium 3.5 - 5.1 mmol/L 3.9 4.1 4.1  Chloride 101 - 111 mmol/L 101 104 100(L)  CO2 22 - 32 mmol/L _0 Calcium 8.9 - 10.3 mg/dL 8.6(L) 8.5(L) 9.5  Total Protein 6.5 - 8.1 g/dL 7.3 - -  Total Bilirubin 0.3 - 1.2 mg/dL 0.5 - -  Alkaline Phos 38 - 126 U/L 86 - -  AST 15 - 41 U/L 13(L) - -  ALT 17 - 63 U/L 21 - -    RADIOGRAPHIC STUDIES: I have personally reviewed the radiological images as listed and agree with the findings in the report CT chest abdomen pelvis 06/26/2017  IMPRESSION: 1. 7 cm right lower pole renal mass. This may reflect urothelial carcinoma given involvement of the collecting system versus renal cell carcinoma. 2. Metastatic disease with thoracic and abdominal lymphadenopathy, lung nodules, and diffuse osseous metastases. 3. Subcentimeter lesion in the right upper lobe bronchus with diffuse abnormal right upper lobe bronchial wall soft tissue thickening.  07/15/2017 No evidence of pulmonary emboli. Attenuation of the pulmonary arterial branches is noted related to hilar adenopathy. Increase in the size of the  central right hilar mass with increase in mediastinal and hilar adenopathy when compared with the recent exam. New right upper lobe collapse secondary to bronchial occlusion.Additionally some patchy infiltrative changes noted in the lower lobe likely related to the acute infiltrate. Diffuse bony metastatic disease with evidence of pathologic fracture involving the left tenth rib lesion.  Pathology 07/10/2017 Surgical Pathology  CASE: [id number]  PATIENT: Parke Meiner  Surgical Pathology Report  DIAGNOSIS:  A. LYTIC BONE LESION, RIGHT ILIAC CREST; CT-GUIDED BIOPSY:  - METASTATIC CARCINOMA, PAX-8 POSITIVE, COMPATIBLE WITH METASTATIC CLEAR  CELL RENAL CELL CARCINOMA.    Surgical Pathology 07/24/2017 CASE: [id number]  SPECIMEN SUBMITTED:  A. Lung, right upper lobe, endobronchial biopsy  DIAGNOSIS:  A. LUNG, RIGHT UPPER LOBE; ENDOBRONCHIAL BIOPSY:  - METASTATIC CARCINOMA CONSISTENT WITH CLEAR CELL RENAL CELL CARCINOMA  07/24/2017 Cytology - Non PAP  CASE: [id number]  SPECIMEN SUBMITTED:  A. Lung, right upper lobe; bronchoscopy brushing  DIAGNOSIS:  A. LUNG, RIGHT UPPER LOBE; BRONCHOSCOPY BRUSHING:  - POSITIVE FOR MALIGNANCY.  - METASTATIC CARCINOMA CONSISTENT WITH CLEAR CELL RENAL CELL CARCINOMA  MRI 12/15/2017. Patient has multiple metastatic deposits identified. He hasn't a lesion in the inferior aspect of the base of the right femoral neck measuring 1.8 cm cranial caudal by 0.7 cm AP by approximately 1.3 cm transverse. Largest visualized lesion is in the anterior right ilium measuring 4 cm transverse by 5.2 cm AP by 4.6 cm cranial caudal. There is also a lesion in the left femur measuring 2.7 and a meter by 2.0 cm x 2.5 cm. Additional scattered lesions are identified including punctate lesion in the posterior right acetabulum and essentially in the femoral head.    ASSESSMENT/PLAN Cancer Staging Renal cell carcinoma of right kidney [facility]) Staging form: Kidney, AJCC 8th  Edition - Clinical stage from 07/14/2017: Stage IV (cT3a, cNX, pM1) - Signed by Earlie Server, MD on 07/15/2017 - Pathologic: No stage assigned - Unsigned  1. Primary malignant neoplasm of kidney with metastasis from kidney to other site, unspecified laterality ([place])   2. Bone metastasis ([place])   3. Pneumonitis   4. Neoplasm related pain    #Clinically his respiratory symptoms has significantly improved.  Will continue hold immunotherapy and slowly weaned dow  his steroids. Taper down to 20 mg daily for one week.  # right hip pain, MRI showed right femur neck lesion. Will ask patient to see Dr. Baruch Gouty for additional palliative radiation. # Refilled his long-acting MS Contin 30 mg every 12 hours, increase Percocet toevery 6 hours as needed. Dispense 2 weeks supply. # Recent CT chest and MRI hip pointing towards patient may have a progression. We'll proceed with abdomen and pelvis CT to finish staging and decide if you need to switch therapy. Follow in 1 week for evaluation.  All questions were answered. The patient knows to call the clinic with any problems, questions or concerns.Earlie Server, MD, PhD Hematology Oncology [facility] at [facility] Pager- [phone] 12/17/2017

## 2017-12-17 ENCOUNTER — Inpatient Hospital Stay: Payer: Medicaid Other

## 2017-12-17 ENCOUNTER — Inpatient Hospital Stay (HOSPITAL_BASED_OUTPATIENT_CLINIC_OR_DEPARTMENT_OTHER): Payer: Medicaid Other | Admitting: Oncology

## 2017-12-17 ENCOUNTER — Encounter: Payer: Self-pay | Admitting: Oncology

## 2017-12-17 VITALS — BP 127/82 | HR 93 | Temp 98.9°F | Resp 16 | Wt 223.0 lb

## 2017-12-17 DIAGNOSIS — C7989 Secondary malignant neoplasm of other specified sites: Secondary | ICD-10-CM

## 2017-12-17 DIAGNOSIS — Z720 Tobacco use: Secondary | ICD-10-CM

## 2017-12-17 DIAGNOSIS — C649 Malignant neoplasm of unspecified kidney, except renal pelvis: Secondary | ICD-10-CM

## 2017-12-17 DIAGNOSIS — J189 Pneumonia, unspecified organism: Secondary | ICD-10-CM

## 2017-12-17 DIAGNOSIS — C7951 Secondary malignant neoplasm of bone: Secondary | ICD-10-CM | POA: Diagnosis not present

## 2017-12-17 DIAGNOSIS — M25859 Other specified joint disorders, unspecified hip: Secondary | ICD-10-CM | POA: Diagnosis not present

## 2017-12-17 DIAGNOSIS — G893 Neoplasm related pain (acute) (chronic): Secondary | ICD-10-CM

## 2017-12-17 LAB — CBC WITH DIFFERENTIAL/PLATELET
BASOS ABS: 0 10*3/uL (ref 0–0.1)
Basophils Relative: 0 %
Eosinophils Absolute: 0.1 10*3/uL (ref 0–0.7)
Eosinophils Relative: 1 %
HCT: 38.6 % — ABNORMAL LOW (ref 40.0–52.0)
Hemoglobin: 12.7 g/dL — ABNORMAL LOW (ref 13.0–18.0)
LYMPHS PCT: 15 %
Lymphs Abs: 1.1 10*3/uL (ref 1.0–3.6)
MCH: 28.4 pg (ref 26.0–34.0)
MCHC: 33 g/dL (ref 32.0–36.0)
MCV: 86.2 fL (ref 80.0–100.0)
MONO ABS: 0.6 10*3/uL (ref 0.2–1.0)
Monocytes Relative: 8 %
Neutro Abs: 5.4 10*3/uL (ref 1.4–6.5)
Neutrophils Relative %: 76 %
PLATELETS: 286 10*3/uL (ref 150–440)
RBC: 4.48 MIL/uL (ref 4.40–5.90)
RDW: 14.6 % — AB (ref 11.5–14.5)
WBC: 7.2 10*3/uL (ref 3.8–10.6)

## 2017-12-17 LAB — COMPREHENSIVE METABOLIC PANEL
ALBUMIN: 3.1 g/dL — AB (ref 3.5–5.0)
ALK PHOS: 82 U/L (ref 38–126)
ALT: 16 U/L — ABNORMAL LOW (ref 17–63)
AST: 14 U/L — AB (ref 15–41)
Anion gap: 10 (ref 5–15)
BILIRUBIN TOTAL: 0.3 mg/dL (ref 0.3–1.2)
BUN: 15 mg/dL (ref 6–20)
CALCIUM: 8.5 mg/dL — AB (ref 8.9–10.3)
CO2: 27 mmol/L (ref 22–32)
CREATININE: 0.85 mg/dL (ref 0.61–1.24)
Chloride: 98 mmol/L — ABNORMAL LOW (ref 101–111)
GFR calc Af Amer: >60 mL/min (ref >60)
GLUCOSE: 116 mg/dL — AB (ref 65–99)
Potassium: 4.2 mmol/L (ref 3.5–5.1)
Sodium: 135 mmol/L (ref 135–145)
TOTAL PROTEIN: 6.7 g/dL (ref 6.5–8.1)

## 2017-12-17 MED ORDER — ONDANSETRON 4 MG PO TBDP: 4.0000 mg | ORAL_TABLET | Freq: Three times a day (TID) | ORAL | 3 refills | Status: AC | PRN

## 2017-12-17 MED ORDER — MORPHINE SULFATE ER 15 MG PO TBCR: 15.0000 mg | EXTENDED_RELEASE_TABLET | Freq: Two times a day (BID) | ORAL | 0 refills | Status: DC

## 2017-12-17 MED ORDER — OXYCODONE-ACETAMINOPHEN 10-325 MG PO TABS: 1.0000 | ORAL_TABLET | Freq: Four times a day (QID) | ORAL | 0 refills | Status: AC | PRN

## 2017-12-17 MED ORDER — GABAPENTIN 300 MG PO CAPS: 300.0000 mg | ORAL_CAPSULE | Freq: Three times a day (TID) | ORAL | 0 refills | Status: DC

## 2017-12-17 MED ORDER — PREDNISONE 10 MG PO TABS: 20.0000 mg | ORAL_TABLET | Freq: Every day | ORAL | 0 refills | Status: DC

## 2017-12-17 MED ORDER — ONDANSETRON 4 MG PO TBDP: 4.0000 mg | ORAL_TABLET | Freq: Three times a day (TID) | ORAL | 3 refills | Status: DC | PRN

## 2017-12-17 MED ORDER — MORPHINE SULFATE ER 30 MG PO TBCR: 30.0000 mg | EXTENDED_RELEASE_TABLET | Freq: Two times a day (BID) | ORAL | 0 refills | Status: DC

## 2017-12-19 ENCOUNTER — Other Ambulatory Visit: Payer: Self-pay | Admitting: *Deleted

## 2017-12-19 ENCOUNTER — Other Ambulatory Visit: Payer: Self-pay

## 2017-12-19 ENCOUNTER — Encounter: Payer: Self-pay | Admitting: Radiation Oncology

## 2017-12-19 ENCOUNTER — Ambulatory Visit
Admission: RE | Admit: 2017-12-19 | Discharge: 2017-12-19 | Disposition: A | Discharge status: Home / Self Care | Payer: Medicaid Other | Source: Ambulatory Visit | Attending: Radiation Oncology | Admitting: Radiation Oncology

## 2017-12-19 VITALS — BP 127/81 | HR 90 | Temp 97.0°F | Resp 20 | Wt 220.9 lb

## 2017-12-19 DIAGNOSIS — Z510 Encounter for antineoplastic radiation therapy: Secondary | ICD-10-CM | POA: Insufficient documentation

## 2017-12-19 DIAGNOSIS — C649 Malignant neoplasm of unspecified kidney, except renal pelvis: Secondary | ICD-10-CM | POA: Insufficient documentation

## 2017-12-19 DIAGNOSIS — C7951 Secondary malignant neoplasm of bone: Secondary | ICD-10-CM | POA: Insufficient documentation

## 2017-12-19 NOTE — Progress Notes (Signed)
Radiation Oncology Follow up Note OP N/A old patient new area  Name: Larry Watkins   Date:   12/19/2017 MRN:  [id number] DOB: 1961/05/07    This 57 y.o. male presents to the clinic today for evaluation of palliative radiation therapy to his right acetabulum and proximal femur.  REFERRING PROVIDER: Earlie Server, MD  HPI: Patient is a 57 year old male well known to our department having received palliative radiation therapy to his right hip pelvis and lung for stage IV renal carcinoma.. He has been having increasing pain in his right hip area we have previously treated with 3000 cGy in 10 fractions. Recent MRI scan of his right hip shows involvement of the base of the right femoral neck measuring 1.8 cm as well as lesions in the right acetabulum. He has been on immunotherapy although that is currently on hold I been asked to reevaluate him for possible palliation to his right hip again. Patient is seen today he is on long-acting narcotics. He is able to ambulate well. States he's having a throbbing pain in the right hip. Patient has excellent response to radiation in his chest with resolution of previous atelectasis.  COMPLICATIONS OF TREATMENT: none  FOLLOW UP COMPLIANCE: keeps appointments   PHYSICAL EXAM:  BP 127/81   Pulse 90   Temp (!) 97 F (36.1 C)   Resp 20   Wt 220 lb 14.4 oz (100.2 kg)   BMI 34.60 kg/m  Range of motion of his right lower extremity does not elicit pain motor sensory and DTR levels are equal and symmetric in the lower extremities bilaterally. Well-developed well-nourished patient in NAD. HEENT reveals PERLA, EOMI, discs not visualized.  Oral cavity is clear. No oral mucosal lesions are identified. Neck is clear without evidence of cervical or supraclavicular adenopathy. Lungs are clear to A&P. Cardiac examination is essentially unremarkable with regular rate and rhythm without murmur rub or thrill. Abdomen is benign with no organomegaly or masses noted. Motor sensory  and DTR levels are equal and symmetric in the upper and lower extremities. Cranial nerves II through XII are grossly intact. Proprioception is intact. No peripheral adenopathy or edema is identified. No motor or sensory levels are noted. Crude visual fields are within normal range.  RADIOLOGY RESULTS: MRI scan of his right hip is reviewed and compatible above-stated findings  PLAN: At this time I to go ahead with a single fraction of radiation therapy 800 cGy 1 to his right hip. I'll extend my field down to the midshaft of the femur as well as treat acetabulum. I have personally set up and ordered CT simulation for early next week. Risks and benefits of treatment including skin reaction fatigue all were discussed in detail with the patient. He seems to comprehend my treatment plan well.  I would like to take this opportunity to thank you for allowing me to participate in the care of your patient.Noreene Filbert, MD

## 2017-12-22 ENCOUNTER — Ambulatory Visit
Admission: RE | Admit: 2017-12-22 | Discharge: 2017-12-22 | Disposition: A | Discharge status: Home / Self Care | Payer: Medicaid Other | Source: Ambulatory Visit | Attending: Radiation Oncology | Admitting: Radiation Oncology

## 2017-12-22 DIAGNOSIS — Z510 Encounter for antineoplastic radiation therapy: Secondary | ICD-10-CM | POA: Diagnosis not present

## 2017-12-24 ENCOUNTER — Ambulatory Visit: Payer: Medicaid Other

## 2017-12-24 DIAGNOSIS — Z510 Encounter for antineoplastic radiation therapy: Secondary | ICD-10-CM | POA: Diagnosis not present

## 2017-12-24 NOTE — Progress Notes (Signed)
[facility] follow up visit  Date of visit: 12/24/17 REASON FOR VISIT Follow up for treatment of metastatic renal cell cancer.  HISTORY OF PRESENT DISEASE/ PERTINENT ONCOLOGY HISTORY 1[name] is  57 y.o. male with 40 pack year smoking histroy is here for evaluation of abnormal image finding. Patient has had right shoulder pain  three months after reaching below a bar to grab a glass. Patient is not able to raise his right arm over shoulder level.  Also having left flank pain associated with pink urine for a few days and resolved after taking a course of amoxicillin. Patient has 40 pack year smoking history.    2 Images:  06/26/2017 CT scan showed a concerning right kidney mass with possible metastatic disease with thoracic and abdominal lymphadenopathy, lung nodules, and diffuse osseous metastases. 07/15/2017 CT PE protocol for evaluation of shortness of breath or chest pain showed no PE, however right lung collapse and rapid progression of lung adenopathy.  08/10/2017 ED visit due to uncontrolled pain: CT hip showed lucent bone lesions in the left hip and left acetabulum consistent with known metastatic disease. No evidence of pathological fracture.  08/11/2017 Bone scan: FINDINGS:Numerous areas of abnormal bony uptake noted in the skull, bilateral scapulae, sternum, cervical and thoracic spine, posterior left tenth rib, and right iliac bone compatible with metastatic disease. Degenerative uptake in the ankles and feet. Soft tissue activity unremarkable. IMPRESSION:Extensive osseous metastatic disease as above.   3 Pathology:     07/10/2017 07/24/2017 A. Lung, right upper lobe, endobronchial biopsy DIAGNOSIS: A. LUNG, RIGHT UPPER LOBE; ENDOBRONCHIAL BIOPSY: - METASTATIC CARCINOMA CONSISTENT WITH CLEAR CELL RENAL CELL CARCINOMA    Bronchoscopy on 07/24/2017 and biopsy revealed metastatic renal cell cancer.   4 Treatment 07/22/2017. nivolumab and ipilimumab, s/p 4 cycles.  S/p  palliative RT to lung and involved bone mets     5 Pain medication:  Patient finished one month supply of percocet in about one week. So his pain medication Rx are being dispensed weekly and he gets one week supply at one time. Multiple discussion about importance of taking pain medication as instructed.  During one clinic visit, patient was on the phone talking to someone and RN overheard that he said "I am right now at [place] office for the RX, get the money ready".    INTERVAL HISTORY  Larry Watkins 57 y.o.  male with above Oncology history present for follow-up for kidney cancer.  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  His symptoms has improved over the past few weeks, and I have tapered his steroids down to 20 mg 1 week ago. Patient reports that he started to have wheezing again, as well as pain with deep breath.  These are similar symptoms when had lung collapse a few months ago, although to a less severe extent.  Breathing is okay.  He also had developed a new bone lesion of right femur and has been seen and evaluated by Dr. Donella Stade.  He has had additional radiation treatment to that site today.  He was given prescription of MS Contin 30 mg twice daily, plus MS Contin 15 mg twice daily.  However he told me that he is not able to pick up to 30 mg from the pharmacy as he is not due.  So he takes MS Contin 15 mg twice daily for the past week plus Percocet every 4 hours.  Pain is well controlled. Also reports seeing pinkish  urine yesterday.  Denies any dysuria, burning sensation, frequencies. Review of Systems  Constitutional: Negative for fever, malaise/fatigue and weight loss.  HENT: Negative for ear pain, hearing loss, nosebleeds and tinnitus.   Eyes: Negative for blurred vision, double vision and pain.  Respiratory: Negative for cough, sputum production and wheezing.   Cardiovascular: Negative for chest pain and palpitations.   Gastrointestinal: Negative for abdominal pain, diarrhea, heartburn and nausea.       Right lower quadrant  Genitourinary: Positive for hematuria. Negative for dysuria, flank pain and urgency.  Musculoskeletal: Negative for back pain, joint pain and neck pain.       Right hip pain improves. Severe right mid thigh pain.  Skin: Negative for itching and rash.  Neurological: Negative for dizziness, tingling and weakness.  Endo/Heme/Allergies: Does not bruise/bleed easily.  Psychiatric/Behavioral: Negative for suicidal ideas. The patient is not nervous/anxious.      MEDICAL HISTORY: Past Medical History:  Diagnosis Date  . Bone metastasis ([place]) 07/17/2017  . Bone metastasis ([place]) 07/17/2017  . Metastatic renal cell carcinoma ([place])     SURGICAL HISTORY: Past Surgical History:  Procedure Laterality Date  . FLEXIBLE BRONCHOSCOPY N/A 07/24/2017   Procedure: FLEXIBLE BRONCHOSCOPY;  Surgeon: [name], MD;  Location: [facility] ORS;  Service: Pulmonary;  Laterality: N/A;  . HERNIA REPAIR    . left middle amputation      with reattachment    SOCIAL HISTORY: Social History   Socioeconomic History  . Marital status: Single    Spouse name: Not on file  . Number of children: Not on file  . Years of education: Not on file  . Highest education level: Not on file  Social Needs  . Financial resource strain: Not on file  . Food insecurity - worry: Not on file  . Food insecurity - inability: Not on file  . Transportation needs - medical: Not on file  . Transportation needs - non-medical: Not on file  Occupational History  . Not on file  Tobacco Use  . Smoking status: Current Every Day Smoker    Packs/day: 0.25    Years: 38.00    Pack years: 9.50  . Smokeless tobacco: Never Used  Substance and Sexual Activity  . Alcohol use: Yes    Comment: 1-2 beers, mixed drinks on weekends  . Drug use: No  . Sexual activity: Not on file  Other Topics Concern  . Not on file  Social History  Narrative   Independent at baseline.    FAMILY HISTORY Family History  Problem Relation Age of Onset  . Cancer Paternal Uncle        brain    ALLERGIES:  has No Known Allergies.  MEDICATIONS:  Current Outpatient Medications  Medication Sig Dispense Refill  . albuterol (PROVENTIL HFA;VENTOLIN HFA) 108 (90 Base) MCG/ACT inhaler Inhale 2 puffs into the lungs every 6 (six) hours as needed for wheezing or shortness of breath. 1 Inhaler 2  . benzonatate (TESSALON) 200 MG capsule Take 1 capsule (200 mg total) by mouth 3 (three) times daily as needed for cough. 30 capsule 0  . gabapentin (NEURONTIN) 300 MG capsule Take 1 capsule (300 mg total) by mouth 3 (three) times daily. 90 capsule 0  . morphine (MS CONTIN) 15 MG 12 hr tablet Take 1 tablet (15 mg total) by mouth every 12 (twelve) hours. 14 tablet 0  . morphine (MS CONTIN) 30 MG 12 hr tablet Take 1 tablet (30 mg total) by mouth every 12 (twelve)  hours. 14 tablet 0  . ondansetron (ZOFRAN ODT) 4 MG disintegrating tablet Take 1 tablet (4 mg total) by mouth every 8 (eight) hours as needed for nausea or vomiting. 90 tablet 3  . oxyCODONE-acetaminophen (PERCOCET) 10-325 MG tablet Take 1 tablet by mouth every 6 (six) hours as needed for up to 14 days for pain. 56 tablet 0  . predniSONE (DELTASONE) 10 MG tablet Take 3 tablets (30 mg total) by mouth daily with breakfast. 21 tablet 0   No current facility-administered medications for this visit.    Vitals:   12/25/17 1448  BP: (!) 143/81  Pulse: 88  Temp: (!) 97 F (36.1 C)  SpO2: (!) 88%   Filed Weights   12/25/17 1448  Weight: 220 lb 2 oz (99.8 kg)   Physical Exam  Constitutional: He is oriented to person, place, and time. He appears well-developed. No distress.  HENT:  Head: Normocephalic and atraumatic.  Mouth/Throat: No oropharyngeal exudate.  Eyes: EOM are normal. Pupils are equal, round, and reactive to light. Left eye exhibits no discharge.  Neck: Neck supple. No JVD present. No  thyromegaly present.  Cardiovascular: Normal rate, regular rhythm and normal heart sounds. Exam reveals no friction rub.  No murmur heard. Pulmonary/Chest: Effort normal. No respiratory distress. He has no wheezes. He exhibits no tenderness.  Right lower lobe wheezing.  Left side clear to auscultation.  Abdominal: Soft. Bowel sounds are normal. He exhibits no mass. There is no tenderness.  Musculoskeletal: Normal range of motion. He exhibits no edema.  Neurological: He is alert and oriented to person, place, and time. He displays normal reflexes. No cranial nerve deficit. Coordination normal.  Skin: Skin is warm and dry. No erythema.  Psychiatric: He has a normal mood and affect. His behavior is normal.    LABORATORY DATA: I have personally reviewed the data as listed: CBC    Component Value Date/Time   WBC 7.2 12/17/2017 0926   RBC 4.48 12/17/2017 0926   HGB 12.7 (L) 12/17/2017 0926   HCT 38.6 (L) 12/17/2017 0926   PLT 286 12/17/2017 0926   MCV 86.2 12/17/2017 0926   MCH 28.4 12/17/2017 0926   MCHC 33.0 12/17/2017 0926   RDW 14.6 (H) 12/17/2017 0926   LYMPHSABS 1.1 12/17/2017 0926   MONOABS 0.6 12/17/2017 0926   EOSABS 0.1 12/17/2017 0926   BASOSABS 0.0 12/17/2017 0926   CMP Latest Ref Rng & Units 12/17/2017 12/10/2017 12/06/2017  Glucose 65 - 99 mg/dL 116(H) 116(H) 154(H)  BUN 6 - 20 mg/dL 15 22(H) 13  Creatinine 0.61 - 1.24 mg/dL 0.85 0.88 0.72  Sodium 135 - 145 mmol/L 135 137 137  Potassium 3.5 - 5.1 mmol/L 4.2 3.9 4.1  Chloride 101 - 111 mmol/L 98(L) 101 104  CO2 22 - 32 mmol/L _0 Calcium 8.9 - 10.3 mg/dL 8.5(L) 8.6(L) 8.5(L)  Total Protein 6.5 - 8.1 g/dL 6.7 7.3 -  Total Bilirubin 0.3 - 1.2 mg/dL 0.3 0.5 -  Alkaline Phos 38 - 126 U/L 82 86 -  AST 15 - 41 U/L 14(L) 13(L) -  ALT 17 - 63 U/L 16(L) 21 -    RADIOGRAPHIC STUDIES: I have personally reviewed the radiological images as listed and agree with the findings in the report CT chest abdomen pelvis 06/26/2017   IMPRESSION: 1. 7 cm right lower pole renal mass. This may reflect urothelial carcinoma given involvement of the collecting system versus renal cell carcinoma. 2. Metastatic disease with thoracic and abdominal lymphadenopathy,  lung nodules, and diffuse osseous metastases. 3. Subcentimeter lesion in the right upper lobe bronchus with diffuse abnormal right upper lobe bronchial wall soft tissue thickening.  07/15/2017 No evidence of pulmonary emboli. Attenuation of the pulmonary arterial branches is noted related to hilar adenopathy. Increase in the size of the central right hilar mass with increase in mediastinal and hilar adenopathy when compared with the recent exam. New right upper lobe collapse secondary to bronchial occlusion.Additionally some patchy infiltrative changes noted in the lower lobe likely related to the acute infiltrate. Diffuse bony metastatic disease with evidence of pathologic fracture involving the left tenth rib lesion.  Pathology 07/10/2017 Surgical Pathology  CASE: [phone]  PATIENT: Larry Watkins  Surgical Pathology Report  DIAGNOSIS:  A. LYTIC BONE LESION, RIGHT ILIAC CREST; CT-GUIDED BIOPSY:  - METASTATIC CARCINOMA, PAX-8 POSITIVE, COMPATIBLE WITH METASTATIC CLEAR  CELL RENAL CELL CARCINOMA.    Surgical Pathology 07/24/2017 CASE: [id number]  SPECIMEN SUBMITTED:  A. Lung, right upper lobe, endobronchial biopsy  DIAGNOSIS:  A. LUNG, RIGHT UPPER LOBE; ENDOBRONCHIAL BIOPSY:  - METASTATIC CARCINOMA CONSISTENT WITH CLEAR CELL RENAL CELL CARCINOMA  07/24/2017 Cytology - Non PAP  CASE: [id number]  SPECIMEN SUBMITTED:  A. Lung, right upper lobe; bronchoscopy brushing  DIAGNOSIS:  A. LUNG, RIGHT UPPER LOBE; BRONCHOSCOPY BRUSHING:  - POSITIVE FOR MALIGNANCY.  - METASTATIC CARCINOMA CONSISTENT WITH CLEAR CELL RENAL CELL CARCINOMA  MRI 12/15/2017. Patient has multiple metastatic deposits identified. He hasn't a lesion in the inferior aspect of the base  of the right femoral neck measuring 1.8 cm cranial caudal by 0.7 cm AP by approximately 1.3 cm transverse. Largest visualized lesion is in the anterior right ilium measuring 4 cm transverse by 5.2 cm AP by 4.6 cm cranial caudal. There is also a lesion in the left femur measuring 2.7 and a meter by 2.0 cm x 2.5 cm. Additional scattered lesions are identified including punctate lesion in the posterior right acetabulum and essentially in the femoral head.    ASSESSMENT/PLAN Cancer Staging Renal cell carcinoma of right kidney [facility]) Staging form: Kidney, AJCC 8th Edition - Clinical stage from 07/14/2017: Stage IV (cT3a, cNX, pM1) - Signed by Earlie Server, MD on 07/15/2017 - Pathologic: No stage assigned - Unsigned  1. Hematuria, unspecified type   2. Kidney cancer, primary, with metastasis from kidney to other site, unspecified laterality ([place])   3. Pneumonitis   4. Wheezing   5. Pleuritic chest pain    #Clinically, her respiratory symptom has improved while on 30 mg of steroids, however, clinically worsening respiratory symptoms after steroids being tapered down to 20 mg daily.   Will continue hold immunotherapy.  I will go back to 30 mg of prednisone daily and continue evaluate him weekly. Refilled his pro-air inhaler. #Right hip pain with MRI showing new right femur neck lesion.  Status post palliative radiation today. Continue pain regimen with MS Contin 30 mg every 12 hours, and Percocet every 4-6 hours as needed.  Will discuss any further titrate his pain regimen next week.  # Recent CT chest and MRI hip pointing towards patient may have a progression.  He has abdomen and pelvis CT scheduled tomorrow for restaging and decide whether need to switch therapy.  Given his worsening respiratory symptoms, I want to add chest CT to be done together to follow-up his radiation pneumonitis.  Follow in 1 week for reevaluation All questions were answered. The patient knows to call the clinic with any  problems, questions or concerns.Talbert Cage  Tasia Catchings, MD, PhD Hematology Oncology [facility] at [facility] Pager- [phone] 12/25/2017

## 2017-12-25 ENCOUNTER — Encounter: Payer: Self-pay | Admitting: Oncology

## 2017-12-25 ENCOUNTER — Inpatient Hospital Stay: Payer: Medicaid Other

## 2017-12-25 ENCOUNTER — Inpatient Hospital Stay (HOSPITAL_BASED_OUTPATIENT_CLINIC_OR_DEPARTMENT_OTHER): Payer: Medicaid Other | Admitting: Oncology

## 2017-12-25 ENCOUNTER — Other Ambulatory Visit: Payer: Self-pay

## 2017-12-25 ENCOUNTER — Ambulatory Visit: Payer: Medicaid Other

## 2017-12-25 ENCOUNTER — Ambulatory Visit
Admission: RE | Admit: 2017-12-25 | Discharge: 2017-12-25 | Disposition: A | Discharge status: Home / Self Care | Payer: Medicaid Other | Source: Ambulatory Visit | Attending: Radiation Oncology | Admitting: Radiation Oncology

## 2017-12-25 VITALS — BP 143/81 | HR 88 | Temp 97.0°F | Wt 220.1 lb

## 2017-12-25 DIAGNOSIS — C7951 Secondary malignant neoplasm of bone: Secondary | ICD-10-CM

## 2017-12-25 DIAGNOSIS — C649 Malignant neoplasm of unspecified kidney, except renal pelvis: Secondary | ICD-10-CM

## 2017-12-25 DIAGNOSIS — R062 Wheezing: Secondary | ICD-10-CM

## 2017-12-25 DIAGNOSIS — C641 Malignant neoplasm of right kidney, except renal pelvis: Secondary | ICD-10-CM

## 2017-12-25 DIAGNOSIS — M899 Disorder of bone, unspecified: Secondary | ICD-10-CM | POA: Diagnosis not present

## 2017-12-25 DIAGNOSIS — M25551 Pain in right hip: Secondary | ICD-10-CM

## 2017-12-25 DIAGNOSIS — C799 Secondary malignant neoplasm of unspecified site: Principal | ICD-10-CM

## 2017-12-25 DIAGNOSIS — R319 Hematuria, unspecified: Secondary | ICD-10-CM

## 2017-12-25 DIAGNOSIS — J189 Pneumonia, unspecified organism: Secondary | ICD-10-CM

## 2017-12-25 DIAGNOSIS — R0789 Other chest pain: Secondary | ICD-10-CM

## 2017-12-25 DIAGNOSIS — Z510 Encounter for antineoplastic radiation therapy: Secondary | ICD-10-CM | POA: Diagnosis not present

## 2017-12-25 DIAGNOSIS — Z720 Tobacco use: Secondary | ICD-10-CM

## 2017-12-25 DIAGNOSIS — C7989 Secondary malignant neoplasm of other specified sites: Secondary | ICD-10-CM | POA: Diagnosis not present

## 2017-12-25 DIAGNOSIS — R0781 Pleurodynia: Secondary | ICD-10-CM

## 2017-12-25 DIAGNOSIS — R0602 Shortness of breath: Secondary | ICD-10-CM

## 2017-12-25 LAB — URINALYSIS, COMPLETE (UACMP) WITH MICROSCOPIC
BACTERIA UA: NONE SEEN
Bilirubin Urine: NEGATIVE
Glucose, UA: NEGATIVE mg/dL
KETONES UR: NEGATIVE mg/dL
Leukocytes, UA: NEGATIVE
Nitrite: NEGATIVE
Protein, ur: NEGATIVE mg/dL
SQUAMOUS EPITHELIAL / LPF: NONE SEEN
Specific Gravity, Urine: 1.015 (ref 1.005–1.030)
pH: 6 (ref 5.0–8.0)

## 2017-12-25 LAB — COMPREHENSIVE METABOLIC PANEL
ALT: 18 U/L (ref 17–63)
AST: 19 U/L (ref 15–41)
Albumin: 3.5 g/dL (ref 3.5–5.0)
Alkaline Phosphatase: 93 U/L (ref 38–126)
Anion gap: 10 (ref 5–15)
BILIRUBIN TOTAL: 0.3 mg/dL (ref 0.3–1.2)
BUN: 21 mg/dL — AB (ref 6–20)
CALCIUM: 9 mg/dL (ref 8.9–10.3)
CHLORIDE: 101 mmol/L (ref 101–111)
CO2: 26 mmol/L (ref 22–32)
CREATININE: 0.88 mg/dL (ref 0.61–1.24)
Glucose, Bld: 165 mg/dL — ABNORMAL HIGH (ref 65–99)
Potassium: 4.7 mmol/L (ref 3.5–5.1)
Sodium: 137 mmol/L (ref 135–145)
TOTAL PROTEIN: 7.5 g/dL (ref 6.5–8.1)

## 2017-12-25 LAB — CBC WITH DIFFERENTIAL/PLATELET
Basophils Absolute: 0 10*3/uL (ref 0–0.1)
Basophils Relative: 1 %
EOS PCT: 0 %
Eosinophils Absolute: 0 10*3/uL (ref 0–0.7)
HEMATOCRIT: 36.7 % — AB (ref 40.0–52.0)
Hemoglobin: 12.3 g/dL — ABNORMAL LOW (ref 13.0–18.0)
LYMPHS ABS: 0.8 10*3/uL — AB (ref 1.0–3.6)
LYMPHS PCT: 10 %
MCH: 28.5 pg (ref 26.0–34.0)
MCHC: 33.6 g/dL (ref 32.0–36.0)
MCV: 84.9 fL (ref 80.0–100.0)
MONO ABS: 0.2 10*3/uL (ref 0.2–1.0)
Monocytes Relative: 3 %
NEUTROS ABS: 6.5 10*3/uL (ref 1.4–6.5)
Neutrophils Relative %: 86 %
PLATELETS: 304 10*3/uL (ref 150–440)
RBC: 4.32 MIL/uL — AB (ref 4.40–5.90)
RDW: 14.8 % — ABNORMAL HIGH (ref 11.5–14.5)
WBC: 7.5 10*3/uL (ref 3.8–10.6)

## 2017-12-25 MED ORDER — DENOSUMAB 120 MG/1.7ML ~~LOC~~ SOLN
120.0000 mg | Freq: Once | SUBCUTANEOUS | Status: AC
  Administered 2017-12-25: 120 mg via SUBCUTANEOUS
  Filled 2017-12-25: qty 1.7

## 2017-12-25 MED ORDER — PREDNISONE 10 MG PO TABS: 30.0000 mg | ORAL_TABLET | Freq: Every day | ORAL | 0 refills | Status: DC

## 2017-12-25 MED ORDER — ALBUTEROL SULFATE HFA 108 (90 BASE) MCG/ACT IN AERS: 2.0000 | INHALATION_SPRAY | Freq: Four times a day (QID) | RESPIRATORY_TRACT | 2 refills | Status: AC | PRN

## 2017-12-25 NOTE — Progress Notes (Signed)
Patient here today for follow up.   

## 2017-12-26 ENCOUNTER — Ambulatory Visit
Admission: RE | Admit: 2017-12-26 | Discharge: 2017-12-26 | Disposition: A | Discharge status: Home / Self Care | Payer: Medicaid Other | Source: Ambulatory Visit | Attending: Oncology | Admitting: Oncology

## 2017-12-26 DIAGNOSIS — I700 Atherosclerosis of aorta: Secondary | ICD-10-CM | POA: Diagnosis not present

## 2017-12-26 DIAGNOSIS — C649 Malignant neoplasm of unspecified kidney, except renal pelvis: Secondary | ICD-10-CM | POA: Diagnosis not present

## 2017-12-26 DIAGNOSIS — C7951 Secondary malignant neoplasm of bone: Secondary | ICD-10-CM | POA: Insufficient documentation

## 2017-12-26 DIAGNOSIS — R590 Localized enlarged lymph nodes: Secondary | ICD-10-CM | POA: Diagnosis not present

## 2017-12-26 DIAGNOSIS — N2889 Other specified disorders of kidney and ureter: Secondary | ICD-10-CM | POA: Insufficient documentation

## 2017-12-26 DIAGNOSIS — I2510 Atherosclerotic heart disease of native coronary artery without angina pectoris: Secondary | ICD-10-CM | POA: Insufficient documentation

## 2017-12-26 DIAGNOSIS — R918 Other nonspecific abnormal finding of lung field: Secondary | ICD-10-CM | POA: Diagnosis not present

## 2017-12-26 DIAGNOSIS — J189 Pneumonia, unspecified organism: Secondary | ICD-10-CM

## 2017-12-26 MED ORDER — IOPAMIDOL (ISOVUE-300) INJECTION 61%
100.0000 mL | Freq: Once | INTRAVENOUS | Status: AC | PRN
  Administered 2017-12-26: 100 mL via INTRAVENOUS

## 2017-12-27 LAB — URINE CULTURE: CULTURE: NO GROWTH

## 2018-01-01 ENCOUNTER — Inpatient Hospital Stay (HOSPITAL_BASED_OUTPATIENT_CLINIC_OR_DEPARTMENT_OTHER): Payer: Medicaid Other | Admitting: Oncology

## 2018-01-01 ENCOUNTER — Encounter: Payer: Self-pay | Admitting: Oncology

## 2018-01-01 VITALS — BP 121/76 | HR 106 | Temp 98.5°F | Wt 222.4 lb

## 2018-01-01 DIAGNOSIS — C649 Malignant neoplasm of unspecified kidney, except renal pelvis: Secondary | ICD-10-CM | POA: Diagnosis not present

## 2018-01-01 DIAGNOSIS — J189 Pneumonia, unspecified organism: Secondary | ICD-10-CM | POA: Diagnosis not present

## 2018-01-01 DIAGNOSIS — C799 Secondary malignant neoplasm of unspecified site: Secondary | ICD-10-CM | POA: Diagnosis not present

## 2018-01-01 DIAGNOSIS — M25551 Pain in right hip: Secondary | ICD-10-CM

## 2018-01-01 DIAGNOSIS — C641 Malignant neoplasm of right kidney, except renal pelvis: Secondary | ICD-10-CM

## 2018-01-01 DIAGNOSIS — Z720 Tobacco use: Secondary | ICD-10-CM

## 2018-01-01 DIAGNOSIS — C7989 Secondary malignant neoplasm of other specified sites: Secondary | ICD-10-CM | POA: Diagnosis not present

## 2018-01-01 DIAGNOSIS — R0602 Shortness of breath: Secondary | ICD-10-CM

## 2018-01-01 DIAGNOSIS — Z79899 Other long term (current) drug therapy: Secondary | ICD-10-CM | POA: Diagnosis not present

## 2018-01-01 DIAGNOSIS — G893 Neoplasm related pain (acute) (chronic): Secondary | ICD-10-CM | POA: Diagnosis not present

## 2018-01-01 DIAGNOSIS — C7951 Secondary malignant neoplasm of bone: Secondary | ICD-10-CM | POA: Diagnosis not present

## 2018-01-01 DIAGNOSIS — R0789 Other chest pain: Secondary | ICD-10-CM

## 2018-01-01 DIAGNOSIS — R319 Hematuria, unspecified: Secondary | ICD-10-CM

## 2018-01-01 DIAGNOSIS — J700 Acute pulmonary manifestations due to radiation: Secondary | ICD-10-CM

## 2018-01-01 MED ORDER — MORPHINE SULFATE ER 30 MG PO TBCR: 30.0000 mg | EXTENDED_RELEASE_TABLET | Freq: Two times a day (BID) | ORAL | 0 refills | Status: DC

## 2018-01-01 MED ORDER — MORPHINE SULFATE ER 15 MG PO TBCR: 15.0000 mg | EXTENDED_RELEASE_TABLET | Freq: Two times a day (BID) | ORAL | 0 refills | Status: DC

## 2018-01-01 MED ORDER — PREDNISONE 10 MG PO TABS: 30.0000 mg | ORAL_TABLET | Freq: Every day | ORAL | 0 refills | Status: DC

## 2018-01-01 MED ORDER — OMEPRAZOLE 20 MG PO CPDR: 20.0000 mg | DELAYED_RELEASE_CAPSULE | Freq: Every day | ORAL | 6 refills | Status: AC

## 2018-01-01 MED ORDER — GABAPENTIN 300 MG PO CAPS: 300.0000 mg | ORAL_CAPSULE | Freq: Three times a day (TID) | ORAL | 0 refills | Status: DC

## 2018-01-01 NOTE — Progress Notes (Signed)
[facility] follow up visit  Date of visit: 01/01/18 REASON FOR VISIT Follow up for treatment of metastatic renal cell cancer.  HISTORY OF PRESENT DISEASE/ PERTINENT ONCOLOGY HISTORY 1[name] is  57 y.o. male with 40 pack year smoking histroy is here for evaluation of abnormal image finding. Patient has had right shoulder pain  three months after reaching below a bar to grab a glass. Patient is not able to raise his right arm over shoulder level.  Also having left flank pain associated with pink urine for a few days and resolved after taking a course of amoxicillin. Patient has 40 pack year smoking history.    2 Images:  06/26/2017 CT scan showed a concerning right kidney mass with possible metastatic disease with thoracic and abdominal lymphadenopathy, lung nodules, and diffuse osseous metastases. 07/15/2017 CT PE protocol for evaluation of shortness of breath or chest pain showed no PE, however right lung collapse and rapid progression of lung adenopathy.  08/10/2017 ED visit due to uncontrolled pain: CT hip showed lucent bone lesions in the left hip and left acetabulum consistent with known metastatic disease. No evidence of pathological fracture.  08/11/2017 Bone scan: FINDINGS:Numerous areas of abnormal bony uptake noted in the skull, bilateral scapulae, sternum, cervical and thoracic spine, posterior left tenth rib, and right iliac bone compatible with metastatic disease. Degenerative uptake in the ankles and feet. Soft tissue activity unremarkable. IMPRESSION:Extensive osseous metastatic disease as above.   3 Pathology:     07/10/2017 07/24/2017 A. Lung, right upper lobe, endobronchial biopsy DIAGNOSIS: A. LUNG, RIGHT UPPER LOBE; ENDOBRONCHIAL BIOPSY: - METASTATIC CARCINOMA CONSISTENT WITH CLEAR CELL RENAL CELL CARCINOMA    Bronchoscopy on 07/24/2017 and biopsy revealed metastatic renal cell cancer.   4 Treatment S/p palliative RT to lung and involved bone  mets 07/22/2017. Started nivolumab and ipilimumab, s/p 4 cycles. Started Nivolumab maintenance from 10/15/2017 to 11/26/2017 (5 cycles)   Immunotherapy was held due to radiation pneumonitis.  He has been started on steroids with slow tapering with fluctuating of his respiratory status.  Currently on 30 mg prednisone daily.        5 Pain medication:  Patient finished one month supply of percocet in about one week. So his pain medication Rx are being dispensed weekly and he gets one week supply at one time. Multiple discussion about importance of taking pain medication as instructed.  During one clinic visit, patient was on the phone talking to someone and RN overheard that he said "I am right now at [place] office for the RX, get the money ready". Currently his pain medication prescriptions are being renewed every 2 weeks.    INTERVAL HISTORY Larry Watkins 57 y.o.  male with above Oncology history present for follow-up for kidney cancer.  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  His shortness of breath/cough has improved initially after placing on steroids, and a steroid has been tapering down to 20 mg, however started wheezing again, and as well as pleuritic chest pain.  Prednisone dose was increased back to 30 mg.  Patient reports that his shortness of breath and the cough is slightly improved after going back to 30 mg however he feels "symptoms are still there".   #He developed new right femur lesion which was treated by Dr. Donella Stade last week.  He reports that he continued to have persistent pain of his right femur after radiation, intermittent, shooting down from his thigh.  #No more episode  of pinkish urine.  UA was checked during last visit which was negative for hematuria.  Review of Systems  Constitutional: Negative for fever, malaise/fatigue and weight loss.  HENT: Negative for ear pain and nosebleeds.   Eyes: Negative for blurred vision,  double vision and pain.  Respiratory: Positive for shortness of breath. Negative for cough and sputum production.   Cardiovascular: Negative for chest pain and palpitations.  Gastrointestinal: Negative for abdominal pain, diarrhea, heartburn and nausea.  Genitourinary: Negative for dysuria, flank pain, hematuria and urgency.  Musculoskeletal: Negative for back pain, joint pain and neck pain.       Right hip pain improves.  right mid thigh pain.  10 out of 10.  Skin: Negative for itching and rash.  Neurological: Negative for dizziness, tingling and weakness.  Endo/Heme/Allergies: Does not bruise/bleed easily.  Psychiatric/Behavioral: Negative for suicidal ideas. The patient is not nervous/anxious.      MEDICAL HISTORY: Past Medical History:  Diagnosis Date  . Bone metastasis ([place]) 07/17/2017  . Bone metastasis ([place]) 07/17/2017  . Metastatic renal cell carcinoma ([place])     SURGICAL HISTORY: Past Surgical History:  Procedure Laterality Date  . FLEXIBLE BRONCHOSCOPY N/A 07/24/2017   Procedure: FLEXIBLE BRONCHOSCOPY;  Surgeon: [name], MD;  Location: [facility] ORS;  Service: Pulmonary;  Laterality: N/A;  . HERNIA REPAIR    . left middle amputation      with reattachment    SOCIAL HISTORY: Social History   Socioeconomic History  . Marital status: Single    Spouse name: Not on file  . Number of children: Not on file  . Years of education: Not on file  . Highest education level: Not on file  Social Needs  . Financial resource strain: Not on file  . Food insecurity - worry: Not on file  . Food insecurity - inability: Not on file  . Transportation needs - medical: Not on file  . Transportation needs - non-medical: Not on file  Occupational History  . Not on file  Tobacco Use  . Smoking status: Current Every Day Smoker    Packs/day: 0.25    Years: 38.00    Pack years: 9.50  . Smokeless tobacco: Never Used  Substance and Sexual Activity  . Alcohol use: Yes    Comment:  1-2 beers, mixed drinks on weekends  . Drug use: No  . Sexual activity: Not on file  Other Topics Concern  . Not on file  Social History Narrative   Independent at baseline.    FAMILY HISTORY Family History  Problem Relation Age of Onset  . Cancer Paternal Uncle        brain    ALLERGIES:  has No Known Allergies.  MEDICATIONS:  Current Outpatient Medications  Medication Sig Dispense Refill  . albuterol (PROVENTIL HFA;VENTOLIN HFA) 108 (90 Base) MCG/ACT inhaler Inhale 2 puffs into the lungs every 6 (six) hours as needed for wheezing or shortness of breath. 1 Inhaler 2  . gabapentin (NEURONTIN) 300 MG capsule Take 1 capsule (300 mg total) by mouth 3 (three) times daily. 90 capsule 0  . morphine (MS CONTIN) 15 MG 12 hr tablet Take 1 tablet (15 mg total) by mouth every 12 (twelve) hours. 10 tablet 0  . morphine (MS CONTIN) 30 MG 12 hr tablet Take 1 tablet (30 mg total) by mouth every 12 (twelve) hours. 10 tablet 0  . ondansetron (ZOFRAN ODT) 4 MG disintegrating tablet Take 1 tablet (4 mg total) by mouth every 8 (eight)  hours as needed for nausea or vomiting. 90 tablet 3  . predniSONE (DELTASONE) 10 MG tablet Take 3 tablets (30 mg total) by mouth daily with breakfast. 21 tablet 0   No current facility-administered medications for this visit.    Vitals:   01/01/18 1410  BP: 121/76  Pulse: (!) 106  Temp: 98.5 F (36.9 C)  SpO2: 95%   Filed Weights   01/01/18 1410  Weight: 222 lb 6.4 oz (100.9 kg)   Physical Exam  Constitutional: He is oriented to person, place, and time. He appears well-developed. No distress.  HENT:  Head: Normocephalic and atraumatic.  Mouth/Throat: No oropharyngeal exudate.  Eyes: EOM are normal. Pupils are equal, round, and reactive to light. Left eye exhibits no discharge.  Neck: Neck supple. No JVD present.  Cardiovascular: Normal rate, regular rhythm and normal heart sounds. Exam reveals no friction rub.  No murmur heard. Pulmonary/Chest: Effort  normal. No respiratory distress. He has no wheezes. He exhibits no tenderness.  No wheezing today, clear to auscultation bilaterally.  Abdominal: Soft. Bowel sounds are normal. He exhibits no mass. There is no tenderness.  Musculoskeletal: Normal range of motion.  Lymphadenopathy:    He has no cervical adenopathy.  Neurological: He is alert and oriented to person, place, and time. He displays normal reflexes. No cranial nerve deficit. Coordination normal.  Skin: Skin is warm and dry. No erythema.  Psychiatric: He has a normal mood and affect. His behavior is normal.    LABORATORY DATA: I have personally reviewed the data as listed: CBC    Component Value Date/Time   WBC 7.5 12/25/2017 1415   RBC 4.32 (L) 12/25/2017 1415   HGB 12.3 (L) 12/25/2017 1415   HCT 36.7 (L) 12/25/2017 1415   PLT 304 12/25/2017 1415   MCV 84.9 12/25/2017 1415   MCH 28.5 12/25/2017 1415   MCHC 33.6 12/25/2017 1415   RDW 14.8 (H) 12/25/2017 1415   LYMPHSABS 0.8 (L) 12/25/2017 1415   MONOABS 0.2 12/25/2017 1415   EOSABS 0.0 12/25/2017 1415   BASOSABS 0.0 12/25/2017 1415   CMP Latest Ref Rng & Units 12/25/2017 12/17/2017 12/10/2017  Glucose 65 - 99 mg/dL 165(H) 116(H) 116(H)  BUN 6 - 20 mg/dL 21(H) 15 22(H)  Creatinine 0.61 - 1.24 mg/dL 0.88 0.85 0.88  Sodium 135 - 145 mmol/L 137 135 137  Potassium 3.5 - 5.1 mmol/L 4.7 4.2 3.9  Chloride 101 - 111 mmol/L 101 98(L) 101  CO2 22 - 32 mmol/L _0 Calcium 8.9 - 10.3 mg/dL 9.0 8.5(L) 8.6(L)  Total Protein 6.5 - 8.1 g/dL 7.5 6.7 7.3  Total Bilirubin 0.3 - 1.2 mg/dL 0.3 0.3 0.5  Alkaline Phos 38 - 126 U/L 93 82 86  AST 15 - 41 U/L 19 14(L) 13(L)  ALT 17 - 63 U/L 18 16(L) 21    RADIOGRAPHIC STUDIES: I have personally reviewed the radiological images as listed and agree with the findings in the report MRI 12/15/2017. Patient has multiple metastatic deposits identified. He hasn't a lesion in the inferior aspect of the base of the right femoral neck measuring  1.8 cm cranial caudal by 0.7 cm AP by approximately 1.3 cm transverse. Largest visualized lesion is in the anterior right ilium measuring 4 cm transverse by 5.2 cm AP by 4.6 cm cranial caudal. There is also a lesion in the left femur measuring 2.7 and a meter by 2.0 cm x 2.5 cm. Additional scattered lesions are identified including punctate lesion in the  posterior right acetabulum and essentially in the femoral head.  CT 12/26/2017 1. Stable appearance of large mass in the lower pole of the right kidney. 2. Evolving postradiation changes in the right lung. Several enlarging and new nodular appearing areas in the right lung, as above. Whether or not these reflect evolving metastatic disease or evolving postradiation changes is uncertain. Continued attention on follow-up studies is recommended. 3. Extensive mediastinal and hilar lymphadenopathy redemonstrated,as above. Similar appearance of retroperitoneal lymphadenopathycompared to the prior study. No other new signs of metastaticdisease elsewhere in the abdomen or pelvis.  4. Widespread metastatic disease to the bones very similar to the prior examination. 5. Aortic atherosclerosis, in addition to left anterior descending coronary artery disease. Please note that although the presence of coronary artery calcium documents the presence of coronary artery disease, the severity of this disease and any potential stenosis cannot be assessed on this non-gated CT examination. Assessment for potential risk factor modification, dietary therapy or pharmacologic therapy may be warranted, if clinically indicated.   ASSESSMENT/PLAN Cancer Staging Renal cell carcinoma of right kidney [facility]) Staging form: Kidney, AJCC 8th Edition - Clinical stage from 07/14/2017: Stage IV (cT3a, cNX, pM1) - Signed by Earlie Server, MD on 07/15/2017 - Pathologic: No stage assigned - Unsigned  1. Renal cell carcinoma of right kidney metastatic to other site [facility])   2. Radiation pneumonitis  ([place])   3. Bone metastasis ([place])    #Radiation pneumonitis: clinically, her respiratory symptom has improved while on 30 mg of steroids, however, clinically worsening respiratory symptoms after steroids being tapered down to 20 mg daily.   Will continue hold immunotherapy.  I will go back to 30 mg of prednisone daily and continue evaluate him weekly. Refilled his pro-air inhaler. #Right hip pain with MRI showing new right femur neck lesion.  Status post palliative radiation.  # ongoing severe right femur pain:  Prescribed him MS Contin 45 mg every 12 hours, and Percocet every 4-6 hours as needed.    # Recent CT chest/abdomen showed new lung lesions,  and MRI hip showing new femur lesion, likely disease progression. In addition, his immunotherapy is held due to radiation pneumonitis.  Discussed with patient about disease progression and need to switch treatment plan.  Plan cabozantinib   Follow in 1 week for reevaluation All questions were answered. The patient knows to call the clinic with any problems, questions or concerns.Earlie Server, MD, PhD Hematology Oncology [facility] Pager- [phone] 01/01/2018

## 2018-01-01 NOTE — Progress Notes (Signed)
DISCONTINUE ON PATHWAY REGIMEN - Renal Cell     A cycle is every 21 days:     Nivolumab      Ipilimumab    A cycle is every 28 days:     Nivolumab   **Always confirm dose/schedule in your pharmacy ordering system**    REASON: Disease Progression PRIOR TREATMENT: RCOS41: Nivolumab 3 mg/kg + Ipilimumab 1 mg/kg q21 Days x 4 Cycles, Followed by Nivolumab 480 mg q28 Days TREATMENT RESPONSE: Progressive Disease (PD)  START ON PATHWAY REGIMEN - Renal Cell     Daily:     Cabozantinib (tablet)   **Always confirm dose/schedule in your pharmacy ordering system**    Patient Characteristics: Metastatic, Clear Cell, Second Line, No Prior Anti-Angiogenic Treatment AJCC M Category: M1 AJCC 8 Stage Grouping: IV Current evidence of distant metastases<= Yes AJCC T Category: T3a AJCC N Category: NX Does patient have oligometastatic disease<= No Would you be surprised if this patient died  in the next year<= I would be surprised if this patient died in the next year Histology: Clear Cell Line of therapy: Second Line Intent of Therapy: Non-Curative / Palliative Intent, Discussed with Patient

## 2018-01-01 NOTE — Progress Notes (Signed)
Patient c/o increase pain to Right leg

## 2018-01-02 ENCOUNTER — Telehealth: Payer: Self-pay | Admitting: Oncology

## 2018-01-02 MED ORDER — CABOZANTINIB S-MALATE 60 MG PO TABS: 60.0000 mg | ORAL_TABLET | Freq: Every day | ORAL | 3 refills | Status: DC

## 2018-01-02 NOTE — Telephone Encounter (Signed)
Oral Oncology Patient Advocate Encounter  Met patient in waiting  room to complete application for [employer] in an effort to reduce patient's out of pocket expense for Cabometyx to $0.    Application completed and faxed to [phone].   Patient assistance phone number for follow up is [phone].    Patient will be here Monday 01/05/2018 to sign paper work.  This encounter will be updated until final determination.   [place] Patient Advocate [phone] 01/02/2018 9:36 AM

## 2018-01-05 ENCOUNTER — Telehealth: Payer: Self-pay | Admitting: Pharmacist

## 2018-01-05 DIAGNOSIS — C641 Malignant neoplasm of right kidney, except renal pelvis: Secondary | ICD-10-CM

## 2018-01-05 MED ORDER — CABOZANTINIB S-MALATE 60 MG PO TABS: 60.0000 mg | ORAL_TABLET | Freq: Every day | ORAL | 3 refills | Status: AC

## 2018-01-05 NOTE — Telephone Encounter (Signed)
Oral Oncology Pharmacist Encounter  Received new prescription for Cabozantinib (Cabometyx) for the treatment of metastatic renal cancer, planned duration until disease progression or unacceptable drug toxicity.  CMP from 12/25/17 assessed, no relevant lab abnormalities. BP on 01/01/18 was within normal limits. Prescription dose and frequency assessed.   Current medication list in Epic reviewed, no DDIs with Cabometxy identified.  Prescription has been e-scribed to the [facility] for benefits analysis and approval.   Patient will be enrolled in Cabometxy Quick Start Program through [employer]. This will supply the patient with a 15 day free supply.   Oral Oncology Clinic will continue to follow for insurance authorization, copayment issues, initial counseling and start date.  Darl Pikes, PharmD, BCPS Hematology/Oncology Clinical Pharmacist [facility] [place] [facility] [phone]  01/05/2018 2:01 PM

## 2018-01-05 NOTE — Telephone Encounter (Signed)
Oral Chemotherapy Pharmacist Encounter   Met patient in lobby to have him sign [employer] enrollment forms for his cabozantinib (Cabometyx). Forms were faxed to [employer]. He is aware that if he receives medication prior to his next appointment with Dr. Tasia Catchings, he is not to start until her see her in office.  Medication education will be provided following his office visit on 01/08/18.    Darl Pikes, PharmD, BCPS Hematology/Oncology Clinical Pharmacist [facility] [phone]  01/05/2018 2:34 PM

## 2018-01-06 ENCOUNTER — Other Ambulatory Visit: Payer: Self-pay | Admitting: Oncology

## 2018-01-06 MED ORDER — OXYCODONE-ACETAMINOPHEN 10-325 MG PO TABS: 1.0000 | ORAL_TABLET | Freq: Four times a day (QID) | ORAL | 0 refills | Status: DC | PRN

## 2018-01-06 MED ORDER — MORPHINE SULFATE ER 30 MG PO TBCR: 30.0000 mg | EXTENDED_RELEASE_TABLET | Freq: Two times a day (BID) | ORAL | 0 refills | Status: DC

## 2018-01-06 MED ORDER — MORPHINE SULFATE ER 15 MG PO TBCR: 15.0000 mg | EXTENDED_RELEASE_TABLET | Freq: Two times a day (BID) | ORAL | 0 refills | Status: DC

## 2018-01-07 ENCOUNTER — Telehealth: Payer: Self-pay | Admitting: Oncology

## 2018-01-07 NOTE — Telephone Encounter (Signed)
Oral Oncology Patient Advocate Encounter  Called patient to let them know to expect a call from [place] to set up shipment for his Cabometyx. Told him if he does not get a call to please call them at [phone].  This is a 15-Day Free Trial.   [facility] Patient Advocate [phone] 01/07/2018 1:38 PM

## 2018-01-08 ENCOUNTER — Inpatient Hospital Stay: Payer: Medicaid Other | Attending: Oncology

## 2018-01-08 ENCOUNTER — Other Ambulatory Visit: Payer: Self-pay

## 2018-01-08 ENCOUNTER — Inpatient Hospital Stay (HOSPITAL_BASED_OUTPATIENT_CLINIC_OR_DEPARTMENT_OTHER): Payer: Medicaid Other | Admitting: Oncology

## 2018-01-08 ENCOUNTER — Encounter: Payer: Self-pay | Admitting: Oncology

## 2018-01-08 VITALS — BP 157/87 | HR 93 | Temp 97.7°F | Resp 12 | Ht 68.0 in | Wt 220.6 lb

## 2018-01-08 DIAGNOSIS — C7801 Secondary malignant neoplasm of right lung: Secondary | ICD-10-CM

## 2018-01-08 DIAGNOSIS — R911 Solitary pulmonary nodule: Secondary | ICD-10-CM | POA: Insufficient documentation

## 2018-01-08 DIAGNOSIS — C799 Secondary malignant neoplasm of unspecified site: Principal | ICD-10-CM

## 2018-01-08 DIAGNOSIS — J700 Acute pulmonary manifestations due to radiation: Secondary | ICD-10-CM

## 2018-01-08 DIAGNOSIS — R05 Cough: Secondary | ICD-10-CM | POA: Insufficient documentation

## 2018-01-08 DIAGNOSIS — F1410 Cocaine abuse, uncomplicated: Secondary | ICD-10-CM | POA: Insufficient documentation

## 2018-01-08 DIAGNOSIS — G893 Neoplasm related pain (acute) (chronic): Secondary | ICD-10-CM

## 2018-01-08 DIAGNOSIS — Z720 Tobacco use: Secondary | ICD-10-CM | POA: Diagnosis not present

## 2018-01-08 DIAGNOSIS — M899 Disorder of bone, unspecified: Secondary | ICD-10-CM | POA: Diagnosis not present

## 2018-01-08 DIAGNOSIS — C641 Malignant neoplasm of right kidney, except renal pelvis: Secondary | ICD-10-CM | POA: Diagnosis present

## 2018-01-08 DIAGNOSIS — F1110 Opioid abuse, uncomplicated: Secondary | ICD-10-CM | POA: Diagnosis not present

## 2018-01-08 DIAGNOSIS — C7951 Secondary malignant neoplasm of bone: Secondary | ICD-10-CM | POA: Diagnosis not present

## 2018-01-08 DIAGNOSIS — I10 Essential (primary) hypertension: Secondary | ICD-10-CM | POA: Insufficient documentation

## 2018-01-08 DIAGNOSIS — C649 Malignant neoplasm of unspecified kidney, except renal pelvis: Secondary | ICD-10-CM

## 2018-01-08 LAB — COMPREHENSIVE METABOLIC PANEL
ALT: 13 U/L — ABNORMAL LOW (ref 17–63)
ANION GAP: 9 (ref 5–15)
AST: 16 U/L (ref 15–41)
Albumin: 3.1 g/dL — ABNORMAL LOW (ref 3.5–5.0)
Alkaline Phosphatase: 81 U/L (ref 38–126)
BUN: 24 mg/dL — ABNORMAL HIGH (ref 6–20)
CHLORIDE: 101 mmol/L (ref 101–111)
CO2: 26 mmol/L (ref 22–32)
Calcium: 8.9 mg/dL (ref 8.9–10.3)
Creatinine, Ser: 0.74 mg/dL (ref 0.61–1.24)
Glucose, Bld: 150 mg/dL — ABNORMAL HIGH (ref 65–99)
POTASSIUM: 4.6 mmol/L (ref 3.5–5.1)
Sodium: 136 mmol/L (ref 135–145)
TOTAL PROTEIN: 7.3 g/dL (ref 6.5–8.1)
Total Bilirubin: 0.3 mg/dL (ref 0.3–1.2)

## 2018-01-08 LAB — CBC WITH DIFFERENTIAL/PLATELET
BASOS ABS: 0.1 10*3/uL (ref 0–0.1)
Basophils Relative: 1 %
Eosinophils Absolute: 0 10*3/uL (ref 0–0.7)
Eosinophils Relative: 0 %
HCT: 34.8 % — ABNORMAL LOW (ref 40.0–52.0)
HEMOGLOBIN: 11.5 g/dL — AB (ref 13.0–18.0)
LYMPHS ABS: 0.7 10*3/uL — AB (ref 1.0–3.6)
LYMPHS PCT: 7 %
MCH: 28 pg (ref 26.0–34.0)
MCHC: 33 g/dL (ref 32.0–36.0)
MCV: 84.8 fL (ref 80.0–100.0)
Monocytes Absolute: 0.4 10*3/uL (ref 0.2–1.0)
Monocytes Relative: 4 %
NEUTROS PCT: 88 %
Neutro Abs: 8.1 10*3/uL — ABNORMAL HIGH (ref 1.4–6.5)
Platelets: 371 10*3/uL (ref 150–440)
RBC: 4.11 MIL/uL — AB (ref 4.40–5.90)
RDW: 14.5 % (ref 11.5–14.5)
WBC: 9.2 10*3/uL (ref 3.8–10.6)

## 2018-01-08 MED ORDER — PREDNISONE 10 MG PO TABS: 30.0000 mg | ORAL_TABLET | Freq: Every day | ORAL | 0 refills | Status: DC

## 2018-01-08 NOTE — Progress Notes (Signed)
[facility] follow up visit  Date of visit: 01/08/18 REASON FOR VISIT Follow up for treatment of metastatic renal cell cancer.  HISTORY OF PRESENT DISEASE/ PERTINENT ONCOLOGY HISTORY 1[name] is  57 y.o. male with 40 pack year smoking histroy is here for evaluation of abnormal image finding. Patient has had right shoulder pain  three months after reaching below a bar to grab a glass. Patient is not able to raise his right arm over shoulder level.  Also having left flank pain associated with pink urine for a few days and resolved after taking a course of amoxicillin. Patient has 40 pack year smoking history.    2 Images:  06/26/2017 CT scan showed a concerning right kidney mass with possible metastatic disease with thoracic and abdominal lymphadenopathy, lung nodules, and diffuse osseous metastases. 07/15/2017 CT PE protocol for evaluation of shortness of breath or chest pain showed no PE, however right lung collapse and rapid progression of lung adenopathy.  08/10/2017 ED visit due to uncontrolled pain: CT hip showed lucent bone lesions in the left hip and left acetabulum consistent with known metastatic disease. No evidence of pathological fracture.  08/11/2017 Bone scan: FINDINGS:Numerous areas of abnormal bony uptake noted in the skull, bilateral scapulae, sternum, cervical and thoracic spine, posterior left tenth rib, and right iliac bone compatible with metastatic disease. Degenerative uptake in the ankles and feet. Soft tissue activity unremarkable. IMPRESSION:Extensive osseous metastatic disease as above.   3 Pathology:     07/10/2017 07/24/2017 A. Lung, right upper lobe, endobronchial biopsy DIAGNOSIS: A. LUNG, RIGHT UPPER LOBE; ENDOBRONCHIAL BIOPSY: - METASTATIC CARCINOMA CONSISTENT WITH CLEAR CELL RENAL CELL CARCINOMA    Bronchoscopy on 07/24/2017 and biopsy revealed metastatic renal cell cancer.   4 Treatment S/p palliative RT to lung and involved bone  mets 07/22/2017. Started nivolumab and ipilimumab, s/p 4 cycles. Started Nivolumab maintenance from 10/15/2017 to 11/26/2017 (5 cycles)   Immunotherapy was held due to radiation pneumonitis.  He has been started on steroids with slow tapering with fluctuating of his respiratory status.  Currently on 30 mg prednisone daily.  # He developed new right femur lesion which was treated by Dr. Donella Stade on 12/26/2017.  He reports that he continued to have persistent pain of his right femur after radiation, intermittent, shooting down from his thigh.        5 Pain medication:  Patient finished one month supply of percocet in about one week. So his pain medication Rx are being dispensed weekly and he gets one week supply at one time. Multiple discussion about importance of taking pain medication as instructed.  During one of his clinic visit, patient was on the phone talking to someone and RN overheard that he said "I am right now at [place] office for the RX, get the money ready".  Currently he has disease progression so that his pain regimen has been recently adjusted. He is on MS contin 26m BID and Percocet 10-3247mQ6 hour PRN.    INTERVAL HISTORY Larry RUELAS89.o.  male with above Oncology history present for follow-up for kidney cancer.  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  His shortness of breath/cough has improved initially after placing on steroids, and a steroid has been tapering down to 20 mg, however started wheezing again, and as well as pleuritic chest pain.  Prednisone dose was increased back to 30 mg, today he reports still feeling some wheezing and SOB, " its still  there", he says.   # He is upset about CMA Almyra Free as " she rolls her eyes up and being rude to me".   ##No more episode of pinkish urine.  UA was checked during last visit which was negative for hematuria. # Pain is better controlled with current regimen.   Review of Systems   Constitutional: Negative for fever, malaise/fatigue and weight loss.  HENT: Negative for ear pain and nosebleeds.   Eyes: Negative for blurred vision, double vision and pain.  Respiratory: Positive for shortness of breath. Negative for cough and sputum production.   Cardiovascular: Negative for chest pain and palpitations.  Gastrointestinal: Negative for abdominal pain, diarrhea, heartburn and nausea.  Genitourinary: Negative for dysuria, flank pain, hematuria and urgency.  Musculoskeletal: Negative for back pain, joint pain and neck pain.       Right hip pain improves.  right mid thigh pain is better, 4 out of 10  Skin: Negative for itching and rash.  Neurological: Negative for dizziness, tingling and weakness.  Endo/Heme/Allergies: Does not bruise/bleed easily.  Psychiatric/Behavioral: Negative for suicidal ideas. The patient is not nervous/anxious.      MEDICAL HISTORY: Past Medical History:  Diagnosis Date  . Bone metastasis ([place]) 07/17/2017  . Bone metastasis ([place]) 07/17/2017  . Metastatic renal cell carcinoma ([place])     SURGICAL HISTORY: Past Surgical History:  Procedure Laterality Date  . FLEXIBLE BRONCHOSCOPY N/A 07/24/2017   Procedure: FLEXIBLE BRONCHOSCOPY;  Surgeon: [name], MD;  Location: [facility] ORS;  Service: Pulmonary;  Laterality: N/A;  . HERNIA REPAIR    . left middle amputation      with reattachment    SOCIAL HISTORY: Social History   Socioeconomic History  . Marital status: Single    Spouse name: Not on file  . Number of children: Not on file  . Years of education: Not on file  . Highest education level: Not on file  Social Needs  . Financial resource strain: Not on file  . Food insecurity - worry: Not on file  . Food insecurity - inability: Not on file  . Transportation needs - medical: Not on file  . Transportation needs - non-medical: Not on file  Occupational History  . Not on file  Tobacco Use  . Smoking status: Current Every Day  Smoker    Packs/day: 0.25    Years: 38.00    Pack years: 9.50  . Smokeless tobacco: Never Used  Substance and Sexual Activity  . Alcohol use: Yes    Comment: 1-2 beers, mixed drinks on weekends  . Drug use: No  . Sexual activity: Not on file  Other Topics Concern  . Not on file  Social History Narrative   Independent at baseline.    FAMILY HISTORY Family History  Problem Relation Age of Onset  . Cancer Paternal Uncle        brain    ALLERGIES:  has No Known Allergies.  MEDICATIONS:  Current Outpatient Medications  Medication Sig Dispense Refill  . albuterol (PROVENTIL HFA;VENTOLIN HFA) 108 (90 Base) MCG/ACT inhaler Inhale 2 puffs into the lungs every 6 (six) hours as needed for wheezing or shortness of breath. 1 Inhaler 2  . cabozantinib S-Malate (CABOMETYX) 60 MG TABS Take 60 mg by mouth daily. 30 tablet 3  . gabapentin (NEURONTIN) 300 MG capsule Take 1 capsule (300 mg total) by mouth 3 (three) times daily. 90 capsule 0  . morphine (MS CONTIN) 15 MG 12 hr tablet Take 1 tablet (15  mg total) by mouth every 12 (twelve) hours. 28 tablet 0  . morphine (MS CONTIN) 30 MG 12 hr tablet Take 1 tablet (30 mg total) by mouth every 12 (twelve) hours. 28 tablet 0  . omeprazole (PRILOSEC) 20 MG capsule Take 1 capsule (20 mg total) by mouth daily. 30 capsule 6  . ondansetron (ZOFRAN ODT) 4 MG disintegrating tablet Take 1 tablet (4 mg total) by mouth every 8 (eight) hours as needed for nausea or vomiting. 90 tablet 3  . oxyCODONE-acetaminophen (PERCOCET) 10-325 MG tablet Take 1 tablet by mouth every 6 (six) hours as needed for pain. 112 tablet 0  . predniSONE (DELTASONE) 10 MG tablet Take 3 tablets (30 mg total) by mouth daily with breakfast. 21 tablet 0   No current facility-administered medications for this visit.    Vitals:   01/08/18 1429 01/08/18 1432  BP:  (!) 157/87  Pulse:  93  Resp: 12   Temp:  97.7 F (36.5 C)   Filed Weights   01/08/18 1429  Weight: 220 lb 9.6 oz (100.1  kg)   Physical Exam  Constitutional: He is oriented to person, place, and time. He appears well-developed. No distress.  HENT:  Head: Normocephalic and atraumatic.  Mouth/Throat: No oropharyngeal exudate.  Eyes: EOM are normal. Pupils are equal, round, and reactive to light. Left eye exhibits no discharge.  Neck: Neck supple. No JVD present.  Cardiovascular: Normal rate, regular rhythm and normal heart sounds. Exam reveals no friction rub.  No murmur heard. Pulmonary/Chest: Effort normal. No respiratory distress. He has no wheezes. He exhibits no tenderness.  minimal wheezing today on right side.  Left side clear.   Abdominal: Soft. Bowel sounds are normal. There is no tenderness.  Musculoskeletal: Normal range of motion. He exhibits no deformity.  Lymphadenopathy:    He has no cervical adenopathy.  Neurological: He is alert and oriented to person, place, and time. He displays normal reflexes. No cranial nerve deficit. Coordination normal.  Skin: Skin is warm and dry. No erythema.  Psychiatric: He has a normal mood and affect. His behavior is normal.    LABORATORY DATA: I have personally reviewed the data as listed: CBC    Component Value Date/Time   WBC 9.2 01/08/2018 1420   RBC 4.11 (L) 01/08/2018 1420   HGB 11.5 (L) 01/08/2018 1420   HCT 34.8 (L) 01/08/2018 1420   PLT 371 01/08/2018 1420   MCV 84.8 01/08/2018 1420   MCH 28.0 01/08/2018 1420   MCHC 33.0 01/08/2018 1420   RDW 14.5 01/08/2018 1420   LYMPHSABS 0.7 (L) 01/08/2018 1420   MONOABS 0.4 01/08/2018 1420   EOSABS 0.0 01/08/2018 1420   BASOSABS 0.1 01/08/2018 1420   CMP Latest Ref Rng & Units 01/08/2018 12/25/2017 12/17/2017  Glucose 65 - 99 mg/dL 150(H) 165(H) 116(H)  BUN 6 - 20 mg/dL 24(H) 21(H) 15  Creatinine 0.61 - 1.24 mg/dL 0.74 0.88 0.85  Sodium 135 - 145 mmol/L 136 137 135  Potassium 3.5 - 5.1 mmol/L 4.6 4.7 4.2  Chloride 101 - 111 mmol/L 101 101 98(L)  CO2 22 - 32 mmol/L _0 Calcium 8.9 - 10.3 mg/dL  8.9 9.0 8.5(L)  Total Protein 6.5 - 8.1 g/dL 7.3 7.5 6.7  Total Bilirubin 0.3 - 1.2 mg/dL 0.3 0.3 0.3  Alkaline Phos 38 - 126 U/L 81 93 82  AST 15 - 41 U/L 16 19 14(L)  ALT 17 - 63 U/L 13(L) 18 16(L)    RADIOGRAPHIC STUDIES:  I have personally reviewed the radiological images as listed and agree with the findings in the report MRI 12/15/2017. Patient has multiple metastatic deposits identified. He hasn't a lesion in the inferior aspect of the base of the right femoral neck measuring 1.8 cm cranial caudal by 0.7 cm AP by approximately 1.3 cm transverse. Largest visualized lesion is in the anterior right ilium measuring 4 cm transverse by 5.2 cm AP by 4.6 cm cranial caudal. There is also a lesion in the left femur measuring 2.7 and a meter by 2.0 cm x 2.5 cm. Additional scattered lesions are identified including punctate lesion in the posterior right acetabulum and essentially in the femoral head.  CT 12/26/2017 1. Stable appearance of large mass in the lower pole of the right kidney. 2. Evolving postradiation changes in the right lung. Several enlarging and new nodular appearing areas in the right lung, as above. Whether or not these reflect evolving metastatic disease or evolving postradiation changes is uncertain. Continued attention on follow-up studies is recommended. 3. Extensive mediastinal and hilar lymphadenopathy redemonstrated,as above. Similar appearance of retroperitoneal lymphadenopathycompared to the prior study. No other new signs of metastaticdisease elsewhere in the abdomen or pelvis.  4. Widespread metastatic disease to the bones very similar to the prior examination. 5. Aortic atherosclerosis, in addition to left anterior descending coronary artery disease. Please note that although the presence of coronary artery calcium documents the presence of coronary artery disease, the severity of this disease and any potential stenosis cannot be assessed on this non-gated CT examination.  Assessment for potential risk factor modification, dietary therapy or pharmacologic therapy may be warranted, if clinically indicated.   ASSESSMENT/PLAN Cancer Staging Renal cell carcinoma of right kidney [facility]) Staging form: Kidney, AJCC 8th Edition - Clinical stage from 07/14/2017: Stage IV (cT3a, cNX, pM1) - Signed by Earlie Server, MD on 07/15/2017 - Pathologic: No stage assigned - Unsigned  1. Renal cell carcinoma of right kidney metastatic to other site [facility])   2. Bone metastasis ([place])   3. Neoplasm related pain    #Radiation pneumonitis: clinically, her respiratory symptom has improved while on 30 mg of steroids, continue to have symptoms if steroid being weaned below 74m.  Continue 30 mg of prednisone daily and continue evaluate him weekly. Continue GI prophylaxis. Continue Albuterol PRN.   #Right hip pain with MRI showing new right femur neck lesion.  Status post palliative radiation.  Prescribed him MS Contin 45 mg every 12 hours, and Percocet every 4-6 hours as needed.  He has 2 weeks supply of MS contin and one months supply percocet.  # Xgeva due on 2/21.   # Recent CT chest/abdomen showed new lung lesions,  and MRI hip showing new femur lesion, likely disease progression. In addition, his immunotherapy is held due to radiation pneumonitis.  Discussed with patient about disease progression and need to switch treatment plan.  Plan Cabozantinib which will be delivered to patient's home. Rationale and side effects of Cabozantinib discussed with patient.  CBC/CMP in one week and he will be evaluated by NP for tolerability.  He will see me in 2 weeks.   All questions were answered. The patient knows to call the clinic with any problems, questions or concerns..Earlie Server MD, PhD Hematology Oncology [facility] LLCat [facility] PLLCPager- [phone]/06/2018

## 2018-01-08 NOTE — Telephone Encounter (Signed)
Oral Chemotherapy Pharmacist Encounter   [facility] Specialty and set-up medication shipment for Larry Watkins. Medication will be shipped today and arrive at his house tomorrow 2/8.  Darl Pikes, PharmD, BCPS Hematology/Oncology Clinical Pharmacist [facility] [phone]  01/08/2018 3:02 PM

## 2018-01-08 NOTE — Progress Notes (Signed)
Patient here for follow up. He states no changes since last appointment. Continues to have leg pain which responds to medication.

## 2018-01-09 NOTE — Telephone Encounter (Signed)
Oral Chemotherapy Pharmacist Encounter   Attempted to call patient to provide patient education for Cabometyx. Unable to reach patient, LVM for patient to give me a call back.  Darl Pikes, PharmD, BCPS Hematology/Oncology Clinical Pharmacist [facility] [phone]  01/09/2018 3:17 PM

## 2018-01-12 ENCOUNTER — Other Ambulatory Visit: Payer: Self-pay

## 2018-01-12 ENCOUNTER — Telehealth: Payer: Self-pay | Admitting: *Deleted

## 2018-01-12 ENCOUNTER — Telehealth: Payer: Self-pay | Admitting: Oncology

## 2018-01-12 ENCOUNTER — Encounter: Payer: Self-pay | Admitting: Nurse Practitioner

## 2018-01-12 ENCOUNTER — Inpatient Hospital Stay (HOSPITAL_BASED_OUTPATIENT_CLINIC_OR_DEPARTMENT_OTHER): Payer: Medicaid Other | Admitting: Nurse Practitioner

## 2018-01-12 ENCOUNTER — Other Ambulatory Visit: Payer: Self-pay | Admitting: Nurse Practitioner

## 2018-01-12 VITALS — BP 137/89 | HR 89 | Temp 97.4°F | Resp 16 | Ht 67.0 in | Wt 224.0 lb

## 2018-01-12 DIAGNOSIS — C641 Malignant neoplasm of right kidney, except renal pelvis: Secondary | ICD-10-CM

## 2018-01-12 DIAGNOSIS — F1410 Cocaine abuse, uncomplicated: Secondary | ICD-10-CM | POA: Diagnosis not present

## 2018-01-12 DIAGNOSIS — G893 Neoplasm related pain (acute) (chronic): Secondary | ICD-10-CM

## 2018-01-12 DIAGNOSIS — R05 Cough: Secondary | ICD-10-CM

## 2018-01-12 DIAGNOSIS — Z720 Tobacco use: Secondary | ICD-10-CM

## 2018-01-12 DIAGNOSIS — C7801 Secondary malignant neoplasm of right lung: Secondary | ICD-10-CM

## 2018-01-12 DIAGNOSIS — F1110 Opioid abuse, uncomplicated: Secondary | ICD-10-CM

## 2018-01-12 DIAGNOSIS — F17200 Nicotine dependence, unspecified, uncomplicated: Secondary | ICD-10-CM

## 2018-01-12 DIAGNOSIS — F1910 Other psychoactive substance abuse, uncomplicated: Secondary | ICD-10-CM

## 2018-01-12 DIAGNOSIS — R053 Chronic cough: Secondary | ICD-10-CM

## 2018-01-12 DIAGNOSIS — C7951 Secondary malignant neoplasm of bone: Secondary | ICD-10-CM | POA: Diagnosis not present

## 2018-01-12 LAB — URINE DRUG SCREEN, QUALITATIVE (ARMC ONLY)
Amphetamines, Ur Screen: NOT DETECTED
BARBITURATES, UR SCREEN: NOT DETECTED
Benzodiazepine, Ur Scrn: NOT DETECTED
CANNABINOID 50 NG, UR ~~LOC~~: NOT DETECTED
COCAINE METABOLITE, UR ~~LOC~~: POSITIVE — AB
MDMA (ECSTASY) UR SCREEN: NOT DETECTED
Methadone Scn, Ur: NOT DETECTED
OPIATE, UR SCREEN: POSITIVE — AB
PHENCYCLIDINE (PCP) UR S: NOT DETECTED
Tricyclic, Ur Screen: NOT DETECTED

## 2018-01-12 MED ORDER — TIOTROPIUM BROMIDE MONOHYDRATE 18 MCG IN CAPS
18.0000 ug | ORAL_CAPSULE | Freq: Every day | RESPIRATORY_TRACT | 12 refills | Status: AC
Start: 2018-01-12 — End: ?

## 2018-01-12 MED FILL — CABOMETYX 60 MG TABLET: 60 | 30 days supply | Qty: 30 | Fill #0

## 2018-01-12 NOTE — Telephone Encounter (Signed)
Oral Oncology Patient Advocate Encounter  Called [facility] pharmacy to ask them to mail patients Cabometyx out to patient. Gave them credit card information. Will be ordered and then shipped. Patient has some on hand.    [place] Patient Advocate [phone] 01/12/2018 9:14 AM

## 2018-01-12 NOTE — Progress Notes (Signed)
Symptom Management Consult note [facility]  Telephone:([phone] [phone]  Patient Care Team: Earlie Server, MD as PCP - General (Oncology)   Name of the patient: Larry Watkins  [id number]  [birth year]   Date of visit: 01/12/18  Diagnosis- Metastatic Renal Cell Carcinoma  Chief complaint/ Reason for visit- Pain & Right Leg Pain   Heme/Onc history: Patient last evaluated by primary oncologist, Dr. Tasia Catchings on, 55/7/19.  Patient initially presented with right shoulder pain and decreased mobility. He also complained of blood in his urine. Had 40 pack year history of smoking.  06/26/17 CT showed right kidney mass with thoracic and abdominal lymphadenopathy, lung nodules, and diffuse osseous metastases concerning for metastatic disease.  07/15/17- CT PE protocol for shortness of breath showed no PE but did show collapse of right lung and rapid progression of lung adenopathy.  07/22/17- Initiated nivolumab and ipilimumab on 07/22/17. Completed 4 cycles on 09/24/17.  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  08/10/17 - ED visit d/t uncontrolled pain. CT hip showed lucent bone lesions in the left hip and left acetabulum consistent with metastatic disease. No evidence of pathologic fracture.  08/11/17- bone scan showed numerous areas of abnormal bony updated noted in skull, bilateral scapulae, sternum, cervical and thoracic spine, posterior left tenth rib, and right iliac bone compatible with metastatic disease. Degenerative uptake in the ankles and feet noted.  10/15/17- Started maintenance Opdivo. Immunotherapy was held d/t  Radiation pneumonitis and he was started on steroids. Dr. Tasia Catchings has attempted tapering steroids down but has been unable to do so d/t fluctuating respiratory status.   12/26/17- Dr. Baruch Gouty evaluated lesion in right femur which patient was symptomatic from. Received one  treatment of XRT for pain.  01/12/18- Initiated Cabometyx 50 mg tablets today.   Comorbidities complicating care- Previously patient had used a month supply of Percocet in one week. Currently, Dr. Tasia Catchings prescribes pain medications one week at a time and has counseled patient regarding appropriate use of pain medications. Nursing had previously reported concerns of inappropriate use. Most recently patient was stable on MS Contin 45 mg BID and Percocet 10-325 mg q6h prn.   Interval history-patient presents to symptom management clinic today for complaints of cough and pain.  Complains of productive cough with sputum described as yellow and green.  Symptoms began  but several weeks ago".  Cough is productive with yellow-green sputum and has recently noticed blood-tinged sputum.  Also complains of wheezing and shortness of breath during cough.  Says chest is painful during coughing and waxes and wanes over time.  He states that "everything" aggravates cough and symptoms.  Does not have a history of asthma or environmental allergens.  Denies recent travel.  Has had a recent chest x-ray.  Continues to smoke.  Weight is stable.  Appetite is stable. Takes oral steroids chronically which improve his pain and breathing. Has been unable to taper. Does not take maintenance medications for COPD. Has used rescue inhaler for acute symptoms.  Does not regularly see pulmonology.  Has been assigned a PCP [facility]) but has not yet seen that provider. Saw Dr. Mortimer Fries inpatient from pulmonology. Patient states he is concerned that his 'lung has collapsed'. Was seen in ED on 12/05/17 and admitted to hospital for Community Acquired Pneumonia of right middle lobe of lung. Was treated with Zosyn, Solu-medtrol, and duonebs. He was switched to Augmentin and prednisone for discharge.  Additionally, patient reports pain right upper leg.  He describes his pain as "throbbing".  Rates pain 10/10.  He says this is the area that  was radiated for bone metastases and has been causing constant pain for past several weeks.  He states the pain is bad "all day".  Denies recent trauma or changes in activity.  States the pain only improves with pain medication.   states that movement aggravates pain.  States he was taken 3 Percocet since midnight last night.  States that pain interferes with his mobility.  He started his cabometyx this morning. Denies side effects.    ECOG FS:1 - Symptomatic but completely ambulatory  Review of systems- Review of Systems  Constitutional: Positive for malaise/fatigue. Negative for chills, fever and weight loss.  HENT: Negative.   Eyes: Negative.   Respiratory: Positive for cough, hemoptysis, sputum production, shortness of breath and wheezing.   Cardiovascular: Negative.   Gastrointestinal: Negative.   Genitourinary: Negative.   Musculoskeletal: Negative for back pain, falls, joint pain, myalgias and neck pain.       R upper leg pain  Skin: Negative.   Neurological: Positive for weakness. Negative for dizziness, tingling, tremors, sensory change, speech change, loss of consciousness and headaches.  Endo/Heme/Allergies: Negative for environmental allergies and polydipsia. Does not bruise/bleed easily.  Psychiatric/Behavioral: Positive for substance abuse (States drinking liquor w/ pain medication & endorses taking increased doses of medications). Negative for depression. The patient has insomnia. The patient is not nervous/anxious.        Restless     Current treatment- Xgeva (last 12/25/17). Last Opdivo 11/27/17  No Known Allergies   Past Medical History:  Diagnosis Date  . Bone metastasis ([place]) 07/17/2017  . Bone metastasis ([place]) 07/17/2017  . Metastatic renal cell carcinoma [facility])      Past Surgical History:  Procedure Laterality Date  . FLEXIBLE BRONCHOSCOPY N/A 07/24/2017   Procedure: FLEXIBLE BRONCHOSCOPY;  Surgeon: [name], MD;  Location: [facility] ORS;  Service:  Pulmonary;  Laterality: N/A;  . HERNIA REPAIR    . left middle amputation      with reattachment    Social History   Socioeconomic History  . Marital status: Single    Spouse name: Not on file  . Number of children: Not on file  . Years of education: Not on file  . Highest education level: Not on file  Social Needs  . Financial resource strain: Not on file  . Food insecurity - worry: Not on file  . Food insecurity - inability: Not on file  . Transportation needs - medical: Not on file  . Transportation needs - non-medical: Not on file  Occupational History  . Not on file  Tobacco Use  . Smoking status: Current Every Day Smoker    Packs/day: 0.25    Years: 38.00    Pack years: 9.50  . Smokeless tobacco: Never Used  Substance and Sexual Activity  . Alcohol use: Yes    Comment: 1-2 beers, mixed drinks on weekends  . Drug use: No  . Sexual activity: Not on file  Other Topics Concern  . Not on file  Social History Narrative   Independent at baseline.    Family History  Problem Relation Age of Onset  . Cancer Paternal Uncle        brain     Current Outpatient Medications:  .  albuterol (PROVENTIL HFA;VENTOLIN HFA) 108 (90 Base) MCG/ACT inhaler, Inhale 2 puffs into the lungs every 6 (  six) hours as needed for wheezing or shortness of breath., Disp: 1 Inhaler, Rfl: 2 .  cabozantinib S-Malate (CABOMETYX) 60 MG TABS, Take 60 mg by mouth daily., Disp: 30 tablet, Rfl: 3 .  gabapentin (NEURONTIN) 300 MG capsule, Take 1 capsule (300 mg total) by mouth 3 (three) times daily., Disp: 90 capsule, Rfl: 0 .  morphine (MS CONTIN) 15 MG 12 hr tablet, Take 1 tablet (15 mg total) by mouth every 12 (twelve) hours., Disp: 28 tablet, Rfl: 0 .  morphine (MS CONTIN) 30 MG 12 hr tablet, Take 1 tablet (30 mg total) by mouth every 12 (twelve) hours., Disp: 28 tablet, Rfl: 0 .  omeprazole (PRILOSEC) 20 MG capsule, Take 1 capsule (20 mg total) by mouth daily., Disp: 30 capsule, Rfl: 6 .   ondansetron (ZOFRAN ODT) 4 MG disintegrating tablet, Take 1 tablet (4 mg total) by mouth every 8 (eight) hours as needed for nausea or vomiting., Disp: 90 tablet, Rfl: 3 .  oxyCODONE-acetaminophen (PERCOCET) 10-325 MG tablet, Take 1 tablet by mouth every 6 (six) hours as needed for pain., Disp: 112 tablet, Rfl: 0 .  predniSONE (DELTASONE) 10 MG tablet, Take 3 tablets (30 mg total) by mouth daily with breakfast., Disp: 21 tablet, Rfl: 0  Physical exam:  Vitals:   01/12/18 1406 01/12/18 1412  BP:  137/89  Pulse:  89  Resp: 16   Temp:  (!) 97.4 F (36.3 C)  TempSrc:  Tympanic  SpO2:  96%  Weight: 224 lb (101.6 kg)   Height: _0  (1.702 m)    Physical Exam  Constitutional: He is well-developed, well-nourished, and in no distress. No distress.  Accompanied  HENT:  Head: Normocephalic.  Mouth/Throat: Oropharynx is clear and moist.  Poor dentition  Eyes: Conjunctivae are normal. Pupils are equal, round, and reactive to light. No scleral icterus.  Neck: Normal range of motion.  Cardiovascular: Normal rate, regular rhythm and intact distal pulses. Exam reveals no friction rub.  No murmur heard. Pulmonary/Chest: Effort normal. No accessory muscle usage or stridor. He has no decreased breath sounds. He has no wheezes. He has rhonchi in the right upper field, the right middle field, the right lower field, the left upper field and the left lower field. He has no rales.  Abdominal: Soft. He exhibits no distension. There is no tenderness.  Genitourinary:  Genitourinary Comments: Urine sample visualized- Dark tea colored; very concentrated appearing  Musculoskeletal:  Ambulating w/o assistive devices. Able to get onto exam table w/o assistance. Bearing full weight. Negative straight leg raise bilaterally. TTP mid right thigh. No swelling or temperature change.   Skin: Skin is warm and dry.  Psychiatric: His affect is inappropriate. He is agitated. He expresses impulsivity.     CMP Latest Ref  Rng & Units 01/08/2018  Glucose 65 - 99 mg/dL 150(H)  BUN 6 - 20 mg/dL 24(H)  Creatinine 0.61 - 1.24 mg/dL 0.74  Sodium 135 - 145 mmol/L 136  Potassium 3.5 - 5.1 mmol/L 4.6  Chloride 101 - 111 mmol/L 101  CO2 22 - 32 mmol/L 26  Calcium 8.9 - 10.3 mg/dL 8.9  Total Protein 6.5 - 8.1 g/dL 7.3  Total Bilirubin 0.3 - 1.2 mg/dL 0.3  Alkaline Phos 38 - 126 U/L 81  AST 15 - 41 U/L 16  ALT 17 - 63 U/L 13(L)   CBC Latest Ref Rng & Units 01/08/2018  WBC 3.8 - 10.6 K/uL 9.2  Hemoglobin 13.0 - 18.0 g/dL 11.5(L)  Hematocrit 40.0 - 52.0 %  34.8(L)  Platelets 150 - 440 K/uL 371    [facility] Data Reviewed 01/12/2018    Ct Chest W Contrast  Result Date: 12/26/2017 CLINICAL DATA:  57 year old male with history of metastatic renal cell carcinoma status post chemotherapy and radiation therapy. EXAM: CT CHEST, ABDOMEN, AND PELVIS WITH CONTRAST TECHNIQUE: Multidetector CT imaging of the chest, abdomen and pelvis was performed following the standard protocol during bolus administration of intravenous contrast. CONTRAST:  164m ISOVUE-300 IOPAMIDOL (ISOVUE-300) INJECTION 61% COMPARISON:  CT of the chest 12/04/2017. CT the abdomen and pelvis 10/08/2017. FINDINGS: CT CHEST FINDINGS Cardiovascular: Heart size is normal. There is no significant pericardial fluid, thickening or pericardial calcification. There is aortic atherosclerosis, as well as atherosclerosis of the great vessels of the mediastinum and the coronary arteries, including calcified atherosclerotic plaque in the left anterior descending coronary artery. Mediastinum/Nodes: Numerous enlarged mediastinal and bilateral hilar lymph nodes measuring up to 15 mm in short axis in the left hilar region and low left paratracheal nodal stations. Esophagus is unremarkable in appearance. No axillary lymphadenopathy. Lungs/Pleura: Multiple pulmonary nodules appear increased in size compared to the prior study, with many of them appearing more solid than the prior exam. The  largest of these nodules is in the posterior aspect of the right lower lobe (axial image 89 of series 4) where there is a 2.5 x 1.8 cm solid-appearing nodule which was previously similar in size but more subsolid in appearance. The largest left-sided pulmonary nodule is in the left lower lobe (axial image 115 of series 4) measuring 9 x 8 mm, similar to the prior examination. Some new nodules are also noted, most notable for a 1.4 x 1.1 cm lesion in the superior segment of the right lower lobe (axial image 79 of series 4). Extensive architectural distortion is again noted in the right upper lobe, potentially related to prior radiation therapy. No pleural effusions. Musculoskeletal: Multiple mixed lytic and sclerotic lesions are again noted throughout the visualized axial and appendicular skeleton, largest of which is in the right scapula measuring 2.9 x 3.8 cm (axial image 27 of series 2). CT ABDOMEN PELVIS FINDINGS Hepatobiliary: No suspicious cystic or solid hepatic lesions. No intra or extrahepatic biliary ductal dilatation. Gallbladder is normal in appearance. Pancreas: No pancreatic mass. No pancreatic ductal dilatation. No pancreatic or peripancreatic fluid or inflammatory changes. Spleen: Unremarkable. Adrenals/Urinary Tract: Previously noted heterogeneously enhancing mass in the lower pole of the right kidney is similar to the prior study measuring 7.1 x 6.0 x 4.8 cm. This mass demonstrates some heterogeneous calcifications along the superior aspect of the lesion. The lesion appears encapsulated within Gerota's fascia, and is separate from the right renal vein which is widely patent at this time. Subcentimeter low-attenuation lesion in the lower pole of the left kidney is too small to definitively characterize, but is statistically likely a cyst. No hydroureteronephrosis. Urinary bladder is normal in appearance. Bilateral adrenal glands are normal in appearance. Stomach/Bowel: Normal appearance of the  stomach. No pathologic dilatation of small bowel or colon. A few scattered colonic diverticulae are noted, without surrounding inflammatory changes to suggest an acute diverticulitis at this time. The appendix is not confidently identified and may be surgically absent. Regardless, there are no inflammatory changes noted adjacent to the cecum to suggest the presence of an acute appendicitis at this time. Vascular/Lymphatic: Aortic atherosclerosis, without evidence of aneurysm or dissection in the abdominal or pelvic vasculature. Multiple borderline enlarged and mildly enlarged retroperitoneal lymph nodes are noted, largest of which is  retrocaval near the right renal hilum (axial image 70 of series 2) measuring 1.6 cm in short axis. Reproductive: Prostate gland and seminal vesicles are unremarkable in appearance. Other: No significant volume of ascites.  No pneumoperitoneum. Musculoskeletal: Large lytic lesion in the right ilium measuring 4.9 x 4.3 cm, similar to the prior study. Small lytic lesion in the left inferior pubic ramus, unchanged. IMPRESSION: 1. Stable appearance of large mass in the lower pole of the right kidney. 2. Evolving postradiation changes in the right lung. Several enlarging and new nodular appearing areas in the right lung, as above. Whether or not these reflect evolving metastatic disease or evolving postradiation changes is uncertain. Continued attention on follow-up studies is recommended. 3. Extensive mediastinal and hilar lymphadenopathy redemonstrated, as above. Similar appearance of retroperitoneal lymphadenopathy compared to the prior study. No other new signs of metastatic disease elsewhere in the abdomen or pelvis. 4. Widespread metastatic disease to the bones very similar to the prior examination. 5. Aortic atherosclerosis, in addition to left anterior descending coronary artery disease. Please note that although the presence of coronary artery calcium documents the presence of  coronary artery disease, the severity of this disease and any potential stenosis cannot be assessed on this non-gated CT examination. Assessment for potential risk factor modification, dietary therapy or pharmacologic therapy may be warranted, if clinically indicated. Aortic Atherosclerosis (ICD10-I70.0). Electronically Signed   By: Vinnie Langton M.D.   On: 12/26/2017 16:30   Mr Hip Right W Wo Contrast  Result Date: 12/15/2017 CLINICAL DATA:  Chronic throbbing pain in the right hip, worse over the past week. The patient has a history of metastatic renal carcinoma. EXAM: MRI OF THE RIGHT HIP WITHOUT AND WITH CONTRAST TECHNIQUE: Multiplanar, multisequence MR imaging was performed both before and after administration of intravenous contrast. CONTRAST:  20 ml MULTIHANCE GADOBENATE DIMEGLUMINE 529 MG/ML IV SOLN COMPARISON:  Whole-body bone scan and CT chest, abdomen and pelvis 10/08/2017. FINDINGS: Bones: Multiple metastatic deposits are identified. Given right hip pain, note is made of a lesion in the inferior aspect of the base of the right femoral neck measuring 1.8 cm craniocaudal by 0.7 cm AP by approximately 1.3 cm transverse. Largest visualized lesion is in the anterior right ilium measuring 4.0 cm transverse by 5.2 cm AP by 4.6 cm craniocaudal. There is also a lesion in the left femur measuring 2.7 cm craniocaudal by 2.0 cm transverse by 2.5 cm AP. This lesion is centered just inferior to the lesser trochanter. Additional scattered lesions are identified including punctate lesions in the posterior right acetabulum and centrally in the femoral head. Articular cartilage and labrum Articular cartilage:  Preserved. Labrum:  Normal. Joint or bursal effusion Joint effusion:  None. Bursae:  No evidence of bursitis. Muscles and tendons Muscles and tendons:  Intact. Other findings Miscellaneous:  No acute abnormality. IMPRESSION: Multifocal metastatic disease as described above. There are some lesions in the right  hip and acetabulum. Largest lesion is in the right ilium. Negative for fracture. The exam is otherwise unremarkable. Electronically Signed   By: Inge Rise M.D.   On: 12/15/2017 14:21   Ct Abdomen Pelvis W Contrast  Result Date: 12/26/2017 CLINICAL DATA:  57 year old male with history of metastatic renal cell carcinoma status post chemotherapy and radiation therapy. EXAM: CT CHEST, ABDOMEN, AND PELVIS WITH CONTRAST TECHNIQUE: Multidetector CT imaging of the chest, abdomen and pelvis was performed following the standard protocol during bolus administration of intravenous contrast. CONTRAST:  128m ISOVUE-300 IOPAMIDOL (ISOVUE-300) INJECTION 61% COMPARISON:  CT of the chest 12/04/2017. CT the abdomen and pelvis 10/08/2017. FINDINGS: CT CHEST FINDINGS Cardiovascular: Heart size is normal. There is no significant pericardial fluid, thickening or pericardial calcification. There is aortic atherosclerosis, as well as atherosclerosis of the great vessels of the mediastinum and the coronary arteries, including calcified atherosclerotic plaque in the left anterior descending coronary artery. Mediastinum/Nodes: Numerous enlarged mediastinal and bilateral hilar lymph nodes measuring up to 15 mm in short axis in the left hilar region and low left paratracheal nodal stations. Esophagus is unremarkable in appearance. No axillary lymphadenopathy. Lungs/Pleura: Multiple pulmonary nodules appear increased in size compared to the prior study, with many of them appearing more solid than the prior exam. The largest of these nodules is in the posterior aspect of the right lower lobe (axial image 89 of series 4) where there is a 2.5 x 1.8 cm solid-appearing nodule which was previously similar in size but more subsolid in appearance. The largest left-sided pulmonary nodule is in the left lower lobe (axial image 115 of series 4) measuring 9 x 8 mm, similar to the prior examination. Some new nodules are also noted, most notable  for a 1.4 x 1.1 cm lesion in the superior segment of the right lower lobe (axial image 79 of series 4). Extensive architectural distortion is again noted in the right upper lobe, potentially related to prior radiation therapy. No pleural effusions. Musculoskeletal: Multiple mixed lytic and sclerotic lesions are again noted throughout the visualized axial and appendicular skeleton, largest of which is in the right scapula measuring 2.9 x 3.8 cm (axial image 27 of series 2). CT ABDOMEN PELVIS FINDINGS Hepatobiliary: No suspicious cystic or solid hepatic lesions. No intra or extrahepatic biliary ductal dilatation. Gallbladder is normal in appearance. Pancreas: No pancreatic mass. No pancreatic ductal dilatation. No pancreatic or peripancreatic fluid or inflammatory changes. Spleen: Unremarkable. Adrenals/Urinary Tract: Previously noted heterogeneously enhancing mass in the lower pole of the right kidney is similar to the prior study measuring 7.1 x 6.0 x 4.8 cm. This mass demonstrates some heterogeneous calcifications along the superior aspect of the lesion. The lesion appears encapsulated within Gerota's fascia, and is separate from the right renal vein which is widely patent at this time. Subcentimeter low-attenuation lesion in the lower pole of the left kidney is too small to definitively characterize, but is statistically likely a cyst. No hydroureteronephrosis. Urinary bladder is normal in appearance. Bilateral adrenal glands are normal in appearance. Stomach/Bowel: Normal appearance of the stomach. No pathologic dilatation of small bowel or colon. A few scattered colonic diverticulae are noted, without surrounding inflammatory changes to suggest an acute diverticulitis at this time. The appendix is not confidently identified and may be surgically absent. Regardless, there are no inflammatory changes noted adjacent to the cecum to suggest the presence of an acute appendicitis at this time. Vascular/Lymphatic:  Aortic atherosclerosis, without evidence of aneurysm or dissection in the abdominal or pelvic vasculature. Multiple borderline enlarged and mildly enlarged retroperitoneal lymph nodes are noted, largest of which is retrocaval near the right renal hilum (axial image 70 of series 2) measuring 1.6 cm in short axis. Reproductive: Prostate gland and seminal vesicles are unremarkable in appearance. Other: No significant volume of ascites.  No pneumoperitoneum. Musculoskeletal: Large lytic lesion in the right ilium measuring 4.9 x 4.3 cm, similar to the prior study. Small lytic lesion in the left inferior pubic ramus, unchanged. IMPRESSION: 1. Stable appearance of large mass in the lower pole of the right kidney. 2. Evolving postradiation changes  in the right lung. Several enlarging and new nodular appearing areas in the right lung, as above. Whether or not these reflect evolving metastatic disease or evolving postradiation changes is uncertain. Continued attention on follow-up studies is recommended. 3. Extensive mediastinal and hilar lymphadenopathy redemonstrated, as above. Similar appearance of retroperitoneal lymphadenopathy compared to the prior study. No other new signs of metastatic disease elsewhere in the abdomen or pelvis. 4. Widespread metastatic disease to the bones very similar to the prior examination. 5. Aortic atherosclerosis, in addition to left anterior descending coronary artery disease. Please note that although the presence of coronary artery calcium documents the presence of coronary artery disease, the severity of this disease and any potential stenosis cannot be assessed on this non-gated CT examination. Assessment for potential risk factor modification, dietary therapy or pharmacologic therapy may be warranted, if clinically indicated. Aortic Atherosclerosis (ICD10-I70.0). Electronically Signed   By: Vinnie Langton M.D.   On: 12/26/2017 16:30     Assessment and plan- Patient is a 57 y.o.  male who presents to [facility]om Management Clinic for cough and right leg pain.   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 States he started Cabometyx today. [facility] providing financial assistance with medication and has scheduled refill for patient to be shipped today. Has tolerated well but may be too soon to fully evaluate. Patient was able to verbalize side effects and symptoms of concern. Will check labs and have patient follow-up with Dr. Tasia Catchings as previously scheduled.   Cough- Etiology unclear but suspect underlying COPD and/or malignancy. Afebrile today, Vitals wdl. Pulse ox 96%. Will start Spiriva for COPD maintenance. Discussed mucinex and ample hydration for thinning secretions and improving cough. Discussed that patient should make appointment with PCP to manage long term. Continue albuterol for acute symptoms.   Smoking Cessation- Discussed side effects of smoking and effects given acute symptoms. Discussed options for medications and offered referral to [place] for 3 minutes. Declined patches and/or referral today.   Pain- Continues to have pain in right leg at site of known metastases. Suspect pain r/t bone mets or post-radiation inflammation. Last xgeva 12/25/17. Recommend continuing monthly xgeva. Review of [place] shows patient should have supply of pain medications. Encouraged appropriate use. Could consider topical lidocaine patch given localized nature of pain. Could also consider trial of duloxetine or up-titration of gabapentin if neuropathic pain symptoms. Patient declined these options today. Given history of substance abuse would consider pain contract and/or coordination of care with [facility]. Discussed with Dr. Tasia Catchings.   Substance Abuse- Review of chart revealed concerns for inappropriate use of pain medications. Patient stating during interview that he plans  to stop at liquor store on his way home and drinks liquor regularly. Restless/Agitated during exam. Urine drug screen collected today positive for cocaine and opiates. Urine sent for confirmation testing.    Patient did not have labs drawn today. RTC for labs tomorrow. Follow-up with Dr. Tasia Catchings as previously scheduled.    Visit Diagnosis 1. Renal cell carcinoma of right kidney ([place])   2. Chronic cough   3. Cancer associated pain   4. Substance abuse ([place])   5. Tobacco use disorder     Patient expressed understanding and was in agreement with this plan. He also understands that He can call clinic at any time with any questions, concerns, or complaints.   A total of (30) minutes of face-to-face time was spent  with this patient with greater than 50% of that time in counseling and care-coordination.  Beckey Rutter, DNP, AGNP-C [place] [phone] (office) 01/12/18 5:58 PM

## 2018-01-12 NOTE — Telephone Encounter (Signed)
Oral Chemotherapy Pharmacist Encounter  Patient Education I spoke with patient for overview of new oral chemotherapy medication: Cabometyx (cabozantinib) for the treatment of metastatic renal cell carcinoma, planned duration until disease progression or unacceptable drug toxicity.   Pt is doing well. Counseled patient on administration, dosing, side effects, monitoring, drug-food interactions, safe handling, storage, and disposal. Patient will take 60 mg by mouth daily. Take on an empty stomach.  Side effects include but not limited to: diarrhea, fatigue, hand foot syndrome, increased blood pressure. Medication handout was placed in the mail for him  Reviewed with patient importance of keeping a medication schedule and plan for any missed doses.  Mr. Flammer voiced understanding and appreciation. All questions answered. He plans to get started today 01/12/18 using his 15-days sample supply.  Provided patient with [facility] phone number. Patient knows to call the office with questions or concerns. Oral Chemotherapy Navigation Clinic will continue to follow.  Darl Pikes, PharmD, BCPS Hematology/Oncology Clinical Pharmacist [facility] [phone]  01/12/2018 9:23 AM

## 2018-01-12 NOTE — Telephone Encounter (Signed)
Patient called asking to see Dr Tasia Catchings because he has increased pain in his right lung and right leg. States he is having to take more medicine than ordered to control pain. Reports he feels his right lung has collapsed again. Appointment given and accepted for NP [place] this afternoon at 2 PM

## 2018-01-12 NOTE — Progress Notes (Signed)
Patient here for symptom managemen. Complaints of sever pain in right leg that is not being controlled by pain medication. He also reports coughing up bloody suptum for the past week.

## 2018-01-13 ENCOUNTER — Other Ambulatory Visit: Payer: Self-pay

## 2018-01-13 ENCOUNTER — Telehealth: Payer: Self-pay | Admitting: Nurse Practitioner

## 2018-01-13 ENCOUNTER — Inpatient Hospital Stay: Payer: Medicaid Other

## 2018-01-13 ENCOUNTER — Telehealth: Payer: Self-pay | Admitting: Gastroenterology

## 2018-01-13 DIAGNOSIS — G893 Neoplasm related pain (acute) (chronic): Secondary | ICD-10-CM

## 2018-01-13 DIAGNOSIS — C641 Malignant neoplasm of right kidney, except renal pelvis: Secondary | ICD-10-CM | POA: Diagnosis not present

## 2018-01-13 LAB — COMPREHENSIVE METABOLIC PANEL
ALT: 17 U/L (ref 17–63)
AST: 17 U/L (ref 15–41)
Albumin: 3.1 g/dL — ABNORMAL LOW (ref 3.5–5.0)
Alkaline Phosphatase: 86 U/L (ref 38–126)
Anion gap: 7 (ref 5–15)
BILIRUBIN TOTAL: 0.3 mg/dL (ref 0.3–1.2)
BUN: 17 mg/dL (ref 6–20)
CO2: 28 mmol/L (ref 22–32)
CREATININE: 0.81 mg/dL (ref 0.61–1.24)
Calcium: 8.7 mg/dL — ABNORMAL LOW (ref 8.9–10.3)
Chloride: 103 mmol/L (ref 101–111)
GFR calc Af Amer: >60 mL/min (ref >60)
GFR calc non Af Amer: >60 mL/min (ref >60)
GLUCOSE: 156 mg/dL — AB (ref 65–99)
POTASSIUM: 4 mmol/L (ref 3.5–5.1)
Sodium: 138 mmol/L (ref 135–145)
TOTAL PROTEIN: 6.8 g/dL (ref 6.5–8.1)

## 2018-01-13 LAB — CBC WITH DIFFERENTIAL/PLATELET
BASOS ABS: 0.1 10*3/uL (ref 0–0.1)
Basophils Relative: 1 %
Eosinophils Absolute: 0 10*3/uL (ref 0–0.7)
Eosinophils Relative: 1 %
HEMATOCRIT: 34.7 % — AB (ref 40.0–52.0)
Hemoglobin: 11.5 g/dL — ABNORMAL LOW (ref 13.0–18.0)
LYMPHS PCT: 10 %
Lymphs Abs: 0.8 10*3/uL — ABNORMAL LOW (ref 1.0–3.6)
MCH: 28 pg (ref 26.0–34.0)
MCHC: 33.1 g/dL (ref 32.0–36.0)
MCV: 84.6 fL (ref 80.0–100.0)
MONO ABS: 0.6 10*3/uL (ref 0.2–1.0)
Monocytes Relative: 8 %
NEUTROS ABS: 6.5 10*3/uL (ref 1.4–6.5)
NEUTROS PCT: 80 %
Platelets: 334 10*3/uL (ref 150–440)
RBC: 4.1 MIL/uL — AB (ref 4.40–5.90)
RDW: 14.9 % — AB (ref 11.5–14.5)
WBC: 8 10*3/uL (ref 3.8–10.6)

## 2018-01-13 NOTE — Telephone Encounter (Signed)
Patient called you back.

## 2018-01-13 NOTE — Telephone Encounter (Signed)
Called patient to share results of blood work. States he is tolerating Cabometyx well. Denies side effects other than fatigue which is not severe. Continue to monitor. Follow-up with Dr. Tasia Catchings next week as previously scheduled. Patient denies questions and thanks for call.

## 2018-01-15 ENCOUNTER — Ambulatory Visit: Payer: Medicaid Other | Admitting: Nurse Practitioner

## 2018-01-15 ENCOUNTER — Other Ambulatory Visit: Payer: Medicaid Other

## 2018-01-15 ENCOUNTER — Telehealth: Payer: Self-pay

## 2018-01-15 LAB — MISC LABCORP TEST (SEND OUT): LABCORP TEST CODE: 794370

## 2018-01-15 NOTE — Telephone Encounter (Signed)
Patient stated that he did not call.

## 2018-01-19 ENCOUNTER — Other Ambulatory Visit: Payer: Self-pay | Admitting: *Deleted

## 2018-01-19 MED ORDER — MORPHINE SULFATE ER 30 MG PO TBCR: 30.0000 mg | EXTENDED_RELEASE_TABLET | Freq: Two times a day (BID) | ORAL | 0 refills | Status: DC

## 2018-01-19 MED ORDER — MORPHINE SULFATE ER 15 MG PO TBCR: 15.0000 mg | EXTENDED_RELEASE_TABLET | Freq: Two times a day (BID) | ORAL | 0 refills | Status: AC

## 2018-01-19 NOTE — Telephone Encounter (Signed)
Received 2 phone calls from Joice today.  1st call states that pt's pain medications need to be refilled tomorrow  2nd call states that they would like for the patients records to be sent to [facility] for a second opinion.

## 2018-01-21 ENCOUNTER — Other Ambulatory Visit: Payer: Self-pay | Admitting: *Deleted

## 2018-01-21 ENCOUNTER — Telehealth: Payer: Self-pay | Admitting: Nurse Practitioner

## 2018-01-21 LAB — OPIATES,MS,WB/SP RFX
6-Acetylmorphine: NEGATIVE
Codeine: NEGATIVE ng/mL
DIHYDROCODEINE: NEGATIVE ng/mL
HYDROMORPHONE: NEGATIVE ng/mL
Hydrocodone: NEGATIVE ng/mL
Morphine: 6.2 ng/mL
Opiate Confirmation: POSITIVE

## 2018-01-21 LAB — OXYCODONES,MS,WB/SP RFX
OXYCOCONE: 31.2 ng/mL
OXYMORPHONE: NEGATIVE ng/mL
Oxycodones Confirmation: POSITIVE

## 2018-01-21 MED ORDER — GABAPENTIN 300 MG PO CAPS: 300.0000 mg | ORAL_CAPSULE | Freq: Three times a day (TID) | ORAL | 3 refills | Status: AC

## 2018-01-21 NOTE — Telephone Encounter (Signed)
Recent urine drug screen was positive for cocaine. Discussed findings with Dr. Tasia Catchings.   Called patient to discuss results of drug screen and to notify him of findings which included being positive for cocaine. He verbalized understanding and stated that he planned to transfer his care to [facility] as 'y'all aren't doing what you're supposed to be doing any hows'. Patient provided with opportunity to elaborate on his concerns but he declined.  Also clarified that [facility] does have access to Care Everywhere and would be able to see these results and that we check the [place]. Reminded him that he does have an appointment with Dr. Tasia Catchings tomorrow and that she could address any concerns he has regarding his care or treatment plan. Informed patient that based on the findings of his drug screen we would not be providing him with any controlled substances. Patient stated 'are you done?' Offered to answer any questions. Patient hung up.

## 2018-01-22 ENCOUNTER — Inpatient Hospital Stay: Payer: Medicaid Other

## 2018-01-22 ENCOUNTER — Encounter: Payer: Self-pay | Admitting: Oncology

## 2018-01-22 ENCOUNTER — Inpatient Hospital Stay (HOSPITAL_BASED_OUTPATIENT_CLINIC_OR_DEPARTMENT_OTHER): Payer: Medicaid Other | Admitting: Oncology

## 2018-01-22 VITALS — BP 180/100 | HR 93 | Temp 98.7°F | Resp 18 | Ht 67.0 in | Wt 221.0 lb

## 2018-01-22 DIAGNOSIS — C7801 Secondary malignant neoplasm of right lung: Secondary | ICD-10-CM

## 2018-01-22 DIAGNOSIS — I10 Essential (primary) hypertension: Secondary | ICD-10-CM

## 2018-01-22 DIAGNOSIS — F1410 Cocaine abuse, uncomplicated: Secondary | ICD-10-CM

## 2018-01-22 DIAGNOSIS — C7951 Secondary malignant neoplasm of bone: Secondary | ICD-10-CM

## 2018-01-22 DIAGNOSIS — C799 Secondary malignant neoplasm of unspecified site: Secondary | ICD-10-CM

## 2018-01-22 DIAGNOSIS — G893 Neoplasm related pain (acute) (chronic): Secondary | ICD-10-CM | POA: Diagnosis not present

## 2018-01-22 DIAGNOSIS — F1910 Other psychoactive substance abuse, uncomplicated: Secondary | ICD-10-CM

## 2018-01-22 DIAGNOSIS — Z720 Tobacco use: Secondary | ICD-10-CM

## 2018-01-22 DIAGNOSIS — J700 Acute pulmonary manifestations due to radiation: Secondary | ICD-10-CM

## 2018-01-22 DIAGNOSIS — C641 Malignant neoplasm of right kidney, except renal pelvis: Secondary | ICD-10-CM

## 2018-01-22 DIAGNOSIS — C649 Malignant neoplasm of unspecified kidney, except renal pelvis: Secondary | ICD-10-CM

## 2018-01-22 LAB — CBC WITH DIFFERENTIAL/PLATELET
Basophils Absolute: 0 10*3/uL (ref 0–0.1)
Basophils Relative: 1 %
Eosinophils Absolute: 0 10*3/uL (ref 0–0.7)
Eosinophils Relative: 0 %
HCT: 40.4 % (ref 40.0–52.0)
Hemoglobin: 13.6 g/dL (ref 13.0–18.0)
LYMPHS ABS: 0.6 10*3/uL — AB (ref 1.0–3.6)
LYMPHS PCT: 7 %
MCH: 28.1 pg (ref 26.0–34.0)
MCHC: 33.6 g/dL (ref 32.0–36.0)
MCV: 83.6 fL (ref 80.0–100.0)
MONOS PCT: 3 %
Monocytes Absolute: 0.2 10*3/uL (ref 0.2–1.0)
Neutro Abs: 7.1 10*3/uL — ABNORMAL HIGH (ref 1.4–6.5)
Neutrophils Relative %: 89 %
PLATELETS: 328 10*3/uL (ref 150–440)
RBC: 4.83 MIL/uL (ref 4.40–5.90)
RDW: 15.1 % — ABNORMAL HIGH (ref 11.5–14.5)
WBC: 8 10*3/uL (ref 3.8–10.6)

## 2018-01-22 LAB — COMPREHENSIVE METABOLIC PANEL
ALK PHOS: 108 U/L (ref 38–126)
ALT: 27 U/L (ref 17–63)
ANION GAP: 7 (ref 5–15)
AST: 19 U/L (ref 15–41)
Albumin: 3.4 g/dL — ABNORMAL LOW (ref 3.5–5.0)
BUN: 19 mg/dL (ref 6–20)
CALCIUM: 9.2 mg/dL (ref 8.9–10.3)
CO2: 30 mmol/L (ref 22–32)
CREATININE: 0.85 mg/dL (ref 0.61–1.24)
Chloride: 99 mmol/L — ABNORMAL LOW (ref 101–111)
Glucose, Bld: 125 mg/dL — ABNORMAL HIGH (ref 65–99)
Potassium: 5 mmol/L (ref 3.5–5.1)
SODIUM: 136 mmol/L (ref 135–145)
Total Bilirubin: 0.3 mg/dL (ref 0.3–1.2)
Total Protein: 7.7 g/dL (ref 6.5–8.1)

## 2018-01-22 LAB — DRUG SCREEN 10 W/CONF, SERUM
Amphetamines, IA: NEGATIVE ng/mL
Barbiturates, IA: NEGATIVE ug/mL
Benzodiazepines, IA: NEGATIVE ng/mL
Cocaine & Metabolite, IA: POSITIVE ng/mL
Methadone, IA: NEGATIVE ng/mL
OPIATES, IA: POSITIVE ng/mL
OXYCODONES, IA: POSITIVE ng/mL
PHENCYCLIDINE, IA: NEGATIVE ng/mL
Propoxyphene, IA: NEGATIVE ng/mL
THC(MARIJUANA) METABOLITE, IA: NEGATIVE ng/mL

## 2018-01-22 LAB — COCAINE,MS,WB/SP RFX
Benzoylecgonine: 1320 ng/mL
COCAINE CONFIRMATION: POSITIVE
Cocaine: NEGATIVE ng/mL

## 2018-01-22 MED ORDER — AMLODIPINE BESYLATE 2.5 MG PO TABS: 2.5000 mg | ORAL_TABLET | Freq: Every day | ORAL | 0 refills | Status: DC

## 2018-01-22 MED ORDER — PREDNISONE 10 MG PO TABS: 10.0000 mg | ORAL_TABLET | Freq: Every day | ORAL | 0 refills | Status: AC

## 2018-01-22 NOTE — Progress Notes (Signed)
[facility] follow up visit  Date of visit: 01/22/18 REASON FOR VISIT Follow up for treatment of metastatic renal cell cancer.  HISTORY OF PRESENT DISEASE/ PERTINENT ONCOLOGY HISTORY 1[name] is  57 y.o. male with 40 pack year smoking histroy is here for evaluation of abnormal image finding. Patient has had right shoulder pain  three months after reaching below a bar to grab a glass. Patient is not able to raise his right arm over shoulder level.  Also having left flank pain associated with pink urine for a few days and resolved after taking a course of amoxicillin. Patient has 40 pack year smoking history.    2 Images:  06/26/2017 CT scan showed a concerning right kidney mass with possible metastatic disease with thoracic and abdominal lymphadenopathy, lung nodules, and diffuse osseous metastases. 07/15/2017 CT PE protocol for evaluation of shortness of breath or chest pain showed no PE, however right lung collapse and rapid progression of lung adenopathy.  08/10/2017 ED visit due to uncontrolled pain: CT hip showed lucent bone lesions in the left hip and left acetabulum consistent with known metastatic disease. No evidence of pathological fracture.  08/11/2017 Bone scan: FINDINGS:Numerous areas of abnormal bony uptake noted in the skull, bilateral scapulae, sternum, cervical and thoracic spine, posterior left tenth rib, and right iliac bone compatible with metastatic disease. Degenerative uptake in the ankles and feet. Soft tissue activity unremarkable. IMPRESSION:Extensive osseous metastatic disease as above.   3 Pathology:     07/10/2017 07/24/2017 A. Lung, right upper lobe, endobronchial biopsy DIAGNOSIS: A. LUNG, RIGHT UPPER LOBE; ENDOBRONCHIAL BIOPSY: - METASTATIC CARCINOMA CONSISTENT WITH CLEAR CELL RENAL CELL CARCINOMA    Bronchoscopy on 07/24/2017 and biopsy revealed metastatic renal cell cancer.   4 Treatment S/p palliative RT to lung and involved bone  mets 07/22/2017. Started nivolumab and ipilimumab, s/p 4 cycles. Started Nivolumab maintenance from 10/15/2017 to 11/26/2017 (5 cycles)   Immunotherapy was held due to radiation pneumonitis.  He has been started on steroids with slow tapering with fluctuating of his respiratory status.  On tapering course of steroids, currently on 101m daily.   # He developed new right femur lesion which was treated by Dr. CDonella Stadeon 12/26/2017.  He reports that he continued to have persistent pain of his right femur after radiation, intermittent, shooting down from his thigh.  # Started on Carbozantinib on 01/08/2018  5 Pain medication:  Patient finished one month supply of percocet in about one week. So his pain medication Rx are being dispensed weekly and he gets one week supply at one time. Multiple discussion about importance of taking pain medication as instructed.  During one of his clinic visit, patient was on the phone talking to someone and RN overheard that he said "[facility] now [facility], get the money ready".  Currently he has disease progression so that his pain regimen has been recently adjusted. He is on MS contin 469mBID and Percocet 10-32537m6 hour PRN.  01/13/2018 UDS showed positive cocaine. Lauren (Cancer center NP) called him and updated his results.    INTERVAL HISTORY Larry Watkins 23o.  male with above Oncology history present for follow-up for kidney cancer. He has started on Carbozantinib about 2 weeks ago. Reports feeling fatigue and also today blood pressure is high. Denies headache, vision changes, focal weakness.  SOB has been stable, still has cough with blood tinged sputum.  # Pain is controlled with current  regimen.   Review of Systems  Constitutional: Positive for malaise/fatigue. Negative for fever and weight loss.  HENT: Negative for ear pain and nosebleeds.   Eyes: Negative for blurred vision, double vision and pain.  Respiratory: Positive for  shortness of breath. Negative for cough and sputum production.   Cardiovascular: Negative for chest pain and palpitations.  Gastrointestinal: Negative for abdominal pain, diarrhea, heartburn and nausea.  Genitourinary: Negative for dysuria, flank pain, hematuria and urgency.  Musculoskeletal: Negative for back pain, joint pain and neck pain.       Right hip pain improves.  right mid thigh pain is better, 4 out of 10  Skin: Negative for itching and rash.  Neurological: Negative for dizziness, tingling and weakness.  Endo/Heme/Allergies: Does not bruise/bleed easily.  Psychiatric/Behavioral: Negative for suicidal ideas. The patient is not nervous/anxious.      MEDICAL HISTORY: Past Medical History:  Diagnosis Date  . Bone metastasis ([place]) 07/17/2017  . Bone metastasis ([place]) 07/17/2017  . Metastatic renal cell carcinoma ([place])     SURGICAL HISTORY: Past Surgical History:  Procedure Laterality Date  . FLEXIBLE BRONCHOSCOPY N/A 07/24/2017   Procedure: FLEXIBLE BRONCHOSCOPY;  Surgeon: [name], MD;  Location: [facility] ORS;  Service: Pulmonary;  Laterality: N/A;  . HERNIA REPAIR    . left middle amputation      with reattachment    SOCIAL HISTORY: Social History   Socioeconomic History  . Marital status: Single    Spouse name: Not on file  . Number of children: Not on file  . Years of education: Not on file  . Highest education level: Not on file  Social Needs  . Financial resource strain: Not on file  . Food insecurity - worry: Not on file  . Food insecurity - inability: Not on file  . Transportation needs - medical: Not on file  . Transportation needs - non-medical: Not on file  Occupational History  . Not on file  Tobacco Use  . Smoking status: Current Every Day Smoker    Packs/day: 0.25    Years: 38.00    Pack years: 9.50  . Smokeless tobacco: Never Used  Substance and Sexual Activity  . Alcohol use: Yes    Comment: 1-2 beers, mixed drinks on weekends  . Drug  use: No  . Sexual activity: Not on file  Other Topics Concern  . Not on file  Social History Narrative   Independent at baseline.    FAMILY HISTORY Family History  Problem Relation Age of Onset  . Cancer Paternal Uncle        brain    ALLERGIES:  has No Known Allergies.  MEDICATIONS:  Current Outpatient Medications  Medication Sig Dispense Refill  . albuterol (PROVENTIL HFA;VENTOLIN HFA) 108 (90 Base) MCG/ACT inhaler Inhale 2 puffs into the lungs every 6 (six) hours as needed for wheezing or shortness of breath. 1 Inhaler 2  . cabozantinib S-Malate (CABOMETYX) 60 MG TABS Take 60 mg by mouth daily. 30 tablet 3  . gabapentin (NEURONTIN) 300 MG capsule Take 1 capsule (300 mg total) by mouth 3 (three) times daily. 90 capsule 3  . morphine (MS CONTIN) 15 MG 12 hr tablet Take 1 tablet (15 mg total) by mouth every 12 (twelve) hours. 28 tablet 0  . morphine (MS CONTIN) 30 MG 12 hr tablet Take 1 tablet (30 mg total) by mouth every 12 (twelve) hours. 28 tablet 0  . omeprazole (PRILOSEC) 20 MG capsule Take 1 capsule (20 mg total)  by mouth daily. 30 capsule 6  . ondansetron (ZOFRAN ODT) 4 MG disintegrating tablet Take 1 tablet (4 mg total) by mouth every 8 (eight) hours as needed for nausea or vomiting. 90 tablet 3  . oxyCODONE-acetaminophen (PERCOCET) 10-325 MG tablet Take 1 tablet by mouth every 6 (six) hours as needed for pain. 112 tablet 0  . predniSONE (DELTASONE) 10 MG tablet Take 1 tablet (10 mg total) by mouth daily with breakfast. 10 tablet 0  . tiotropium (SPIRIVA HANDIHALER) 18 MCG inhalation capsule Place 1 capsule (18 mcg total) into inhaler and inhale daily. 30 capsule 12  . amLODipine (NORVASC) 2.5 MG tablet Take 1 tablet (2.5 mg total) by mouth daily. 30 tablet 0   No current facility-administered medications for this visit.    Vitals:   01/22/18 1324  BP: (!) 180/100  Pulse: 93  Resp: 18  Temp: 98.7 F (37.1 C)  SpO2: 96%   Filed Weights   01/22/18 1323 01/22/18 1324   Weight: 221 lb (100.2 kg) 221 lb (100.2 kg)   Physical Exam  Constitutional: He is oriented to person, place, and time. He appears well-developed. No distress.  HENT:  Head: Normocephalic and atraumatic.  Mouth/Throat: No oropharyngeal exudate.  Eyes: EOM are normal. Pupils are equal, round, and reactive to light. Left eye exhibits no discharge.  Neck: Neck supple. No JVD present.  Cardiovascular: Normal rate, regular rhythm and normal heart sounds. Exam reveals no friction rub.  No murmur heard. Pulmonary/Chest: Effort normal. No respiratory distress. He exhibits no tenderness.  minimal wheezing today on right side.  Left side clear.   Abdominal: Soft. Bowel sounds are normal.  Musculoskeletal: Normal range of motion. He exhibits no deformity.  Lymphadenopathy:    He has no cervical adenopathy.  Neurological: He is alert and oriented to person, place, and time. He displays normal reflexes. No cranial nerve deficit. Coordination normal.  Skin: Skin is warm and dry. No erythema.  Psychiatric: He has a normal mood and affect. His behavior is normal.    LABORATORY DATA: I have personally reviewed the data as listed: CBC    Component Value Date/Time   WBC 8.0 01/22/2018 1300   RBC 4.83 01/22/2018 1300   HGB 13.6 01/22/2018 1300   HCT 40.4 01/22/2018 1300   PLT 328 01/22/2018 1300   MCV 83.6 01/22/2018 1300   MCH 28.1 01/22/2018 1300   MCHC 33.6 01/22/2018 1300   RDW 15.1 (H) 01/22/2018 1300   LYMPHSABS 0.6 (L) 01/22/2018 1300   MONOABS 0.2 01/22/2018 1300   EOSABS 0.0 01/22/2018 1300   BASOSABS 0.0 01/22/2018 1300   CMP Latest Ref Rng & Units 01/22/2018 01/13/2018 01/08/2018  Glucose 65 - 99 mg/dL 125(H) 156(H) 150(H)  BUN 6 - 20 mg/dL 19 17 24(H)  Creatinine 0.61 - 1.24 mg/dL 0.85 0.81 0.74  Sodium 135 - 145 mmol/L 136 138 136  Potassium 3.5 - 5.1 mmol/L 5.0 4.0 4.6  Chloride 101 - 111 mmol/L 99(L) 103 101  CO2 22 - 32 mmol/L _0 Calcium 8.9 - 10.3 mg/dL 9.2 8.7(L)  8.9  Total Protein 6.5 - 8.1 g/dL 7.7 6.8 7.3  Total Bilirubin 0.3 - 1.2 mg/dL 0.3 0.3 0.3  Alkaline Phos 38 - 126 U/L 108 86 81  AST 15 - 41 U/L _1 ALT 17 - 63 U/L 27 17 13(L)    RADIOGRAPHIC STUDIES: I have personally reviewed the radiological images as listed and agree with the findings in the  report MRI 12/15/2017. Patient has multiple metastatic deposits identified. He hasn't a lesion in the inferior aspect of the base of the right femoral neck measuring 1.8 cm cranial caudal by 0.7 cm AP by approximately 1.3 cm transverse. Largest visualized lesion is in the anterior right ilium measuring 4 cm transverse by 5.2 cm AP by 4.6 cm cranial caudal. There is also a lesion in the left femur measuring 2.7 and a meter by 2.0 cm x 2.5 cm. Additional scattered lesions are identified including punctate lesion in the posterior right acetabulum and essentially in the femoral head.  CT 12/26/2017 1. Stable appearance of large mass in the lower pole of the right kidney. 2. Evolving postradiation changes in the right lung. Several enlarging and new nodular appearing areas in the right lung, as above. Whether or not these reflect evolving metastatic disease or evolving postradiation changes is uncertain. Continued attention on follow-up studies is recommended. 3. Extensive mediastinal and hilar lymphadenopathy redemonstrated,as above. Similar appearance of retroperitoneal lymphadenopathycompared to the prior study. No other new signs of metastaticdisease elsewhere in the abdomen or pelvis.  4. Widespread metastatic disease to the bones very similar to the prior examination. 5. Aortic atherosclerosis, in addition to left anterior descending coronary artery disease. Please note that although the presence of coronary artery calcium documents the presence of coronary artery disease, the severity of this disease and any potential stenosis cannot be assessed on this non-gated CT examination. Assessment for  potential risk factor modification, dietary therapy or pharmacologic therapy may be warranted, if clinically indicated.   ASSESSMENT/PLAN Cancer Staging Renal cell carcinoma of right kidney [facility]) Staging form: Kidney, AJCC 8th Edition - Clinical stage from 07/14/2017: Stage IV (cT3a, cNX, pM1) - Signed by Earlie Server, MD on 07/15/2017 - Pathologic: No stage assigned - Unsigned  1. Renal cell carcinoma of right kidney ([place])   2. Substance abuse ([place])   3. Renal cell carcinoma of right kidney metastatic to other site ([place])   4. Bone metastasis ([place])   5. Neoplasm related pain   6. Radiation pneumonitis ([place])    #Radiation pneumonitis: clinically, her respiratory symptom has improved, continue tapering steroids to 51m daily x 1 week and instruct him to do 150mevery other day after that.  Continue GI prophylaxis. Continue Albuterol PRN.   #Right hip pain with MRI showing new right femur neck lesion.  Status post palliative radiation.  Continue MS Contin 45 mg every 12 hours, and Percocet every 4-6 hours as needed.  # Substance abuse: patient tells me that he usually does not do cocaine, but recently he had a friend coming over and " I had some fun". He understands that we may not refill his pain medication and will have to refer him to pain clinic. He says "I know it is bad for me, I won't do it again".   # Xgeva monthly.  # Hypertension: can be side effects of carbozantinib. I sent him a Rx of Norvasc 2.90m77maily, and instruct patient to hold carbozantinib temperorily for 3 days. Measure blood pressure at home, if BP improves with systolic BP <15<695e may restart on Carbozantinib. If still high, he should let me know and can increased his Norvasc to 90mg39mily.   CBC/CMP in one week and assessment for control of blood pressure.   # Patient wants to go to [facility] second opinion and will need to sign medical record release form.  Follow up in 1 week.  All questions were answered.  The patient  knows to call the clinic with any problems, questions or concerns.Earlie Server, MD, PhD Hematology Oncology [facility] at [facility] Pager- [phone] 01/22/2018

## 2018-01-22 NOTE — Progress Notes (Signed)
Patient c/o increase pain noted to right leg.

## 2018-01-28 ENCOUNTER — Ambulatory Visit: Payer: Medicaid Other | Admitting: Radiation Oncology

## 2018-01-29 ENCOUNTER — Encounter: Payer: Self-pay | Admitting: *Deleted

## 2018-01-29 DIAGNOSIS — F1410 Cocaine abuse, uncomplicated: Secondary | ICD-10-CM | POA: Insufficient documentation

## 2018-01-30 ENCOUNTER — Other Ambulatory Visit: Payer: Self-pay | Admitting: *Deleted

## 2018-01-30 ENCOUNTER — Other Ambulatory Visit: Payer: Self-pay

## 2018-01-30 ENCOUNTER — Inpatient Hospital Stay: Payer: Medicaid Other | Attending: Oncology

## 2018-01-30 ENCOUNTER — Encounter: Payer: Self-pay | Admitting: Oncology

## 2018-01-30 ENCOUNTER — Encounter: Payer: Self-pay | Admitting: *Deleted

## 2018-01-30 ENCOUNTER — Ambulatory Visit
Admission: RE | Admit: 2018-01-30 | Discharge: 2018-01-30 | Disposition: A | Discharge status: Home / Self Care | Payer: Medicaid Other | Source: Ambulatory Visit | Attending: Radiation Oncology | Admitting: Radiation Oncology

## 2018-01-30 ENCOUNTER — Inpatient Hospital Stay (HOSPITAL_BASED_OUTPATIENT_CLINIC_OR_DEPARTMENT_OTHER): Payer: Medicaid Other | Admitting: Oncology

## 2018-01-30 VITALS — BP 141/87 | HR 96 | Temp 97.1°F | Resp 20 | Wt 222.9 lb

## 2018-01-30 VITALS — BP 122/78 | HR 90 | Temp 98.1°F | Resp 18 | Ht 67.0 in | Wt 223.0 lb

## 2018-01-30 DIAGNOSIS — M25551 Pain in right hip: Secondary | ICD-10-CM | POA: Diagnosis not present

## 2018-01-30 DIAGNOSIS — J700 Acute pulmonary manifestations due to radiation: Secondary | ICD-10-CM

## 2018-01-30 DIAGNOSIS — F1910 Other psychoactive substance abuse, uncomplicated: Secondary | ICD-10-CM | POA: Insufficient documentation

## 2018-01-30 DIAGNOSIS — I10 Essential (primary) hypertension: Secondary | ICD-10-CM | POA: Diagnosis not present

## 2018-01-30 DIAGNOSIS — Z923 Personal history of irradiation: Secondary | ICD-10-CM | POA: Diagnosis not present

## 2018-01-30 DIAGNOSIS — G893 Neoplasm related pain (acute) (chronic): Secondary | ICD-10-CM | POA: Insufficient documentation

## 2018-01-30 DIAGNOSIS — Z720 Tobacco use: Secondary | ICD-10-CM | POA: Diagnosis not present

## 2018-01-30 DIAGNOSIS — C641 Malignant neoplasm of right kidney, except renal pelvis: Secondary | ICD-10-CM

## 2018-01-30 DIAGNOSIS — F1911 Other psychoactive substance abuse, in remission: Secondary | ICD-10-CM | POA: Insufficient documentation

## 2018-01-30 DIAGNOSIS — C649 Malignant neoplasm of unspecified kidney, except renal pelvis: Secondary | ICD-10-CM | POA: Diagnosis present

## 2018-01-30 DIAGNOSIS — C7951 Secondary malignant neoplasm of bone: Secondary | ICD-10-CM | POA: Diagnosis not present

## 2018-01-30 DIAGNOSIS — M899 Disorder of bone, unspecified: Secondary | ICD-10-CM

## 2018-01-30 LAB — CBC WITH DIFFERENTIAL/PLATELET
BASOS ABS: 0.1 10*3/uL (ref 0–0.1)
BASOS PCT: 1 %
EOS ABS: 0.1 10*3/uL (ref 0–0.7)
Eosinophils Relative: 1 %
HEMATOCRIT: 37.4 % — AB (ref 40.0–52.0)
HEMOGLOBIN: 13 g/dL (ref 13.0–18.0)
Lymphocytes Relative: 14 %
Lymphs Abs: 0.9 10*3/uL — ABNORMAL LOW (ref 1.0–3.6)
MCH: 28.8 pg (ref 26.0–34.0)
MCHC: 34.7 g/dL (ref 32.0–36.0)
MCV: 83.1 fL (ref 80.0–100.0)
Monocytes Absolute: 0.4 10*3/uL (ref 0.2–1.0)
Monocytes Relative: 7 %
NEUTROS ABS: 4.9 10*3/uL (ref 1.4–6.5)
NEUTROS PCT: 77 %
Platelets: 288 10*3/uL (ref 150–440)
RBC: 4.5 MIL/uL (ref 4.40–5.90)
RDW: 16 % — ABNORMAL HIGH (ref 11.5–14.5)
WBC: 6.3 10*3/uL (ref 3.8–10.6)

## 2018-01-30 LAB — COMPREHENSIVE METABOLIC PANEL
ALK PHOS: 104 U/L (ref 38–126)
ALT: 20 U/L (ref 17–63)
ANION GAP: 9 (ref 5–15)
AST: 20 U/L (ref 15–41)
Albumin: 3.2 g/dL — ABNORMAL LOW (ref 3.5–5.0)
BILIRUBIN TOTAL: 0.5 mg/dL (ref 0.3–1.2)
BUN: 13 mg/dL (ref 6–20)
CALCIUM: 8.7 mg/dL — AB (ref 8.9–10.3)
CO2: 28 mmol/L (ref 22–32)
CREATININE: 0.76 mg/dL (ref 0.61–1.24)
Chloride: 102 mmol/L (ref 101–111)
GFR calc Af Amer: >60 mL/min (ref >60)
GFR calc non Af Amer: >60 mL/min (ref >60)
Glucose, Bld: 115 mg/dL — ABNORMAL HIGH (ref 65–99)
Potassium: 3.9 mmol/L (ref 3.5–5.1)
SODIUM: 139 mmol/L (ref 135–145)
Total Protein: 7.3 g/dL (ref 6.5–8.1)

## 2018-01-30 NOTE — Progress Notes (Signed)
Patient continue to c/o about increase pain noted to Right leg.

## 2018-01-30 NOTE — Progress Notes (Signed)
Radiation Oncology Follow up Note  Name: Larry Watkins   Date:   01/30/2018 MRN:  [id number] DOB: 04/03/1961    This 57 y.o. male presents to the clinic today one month follow-up status post renal fraction palliative radiation therapy to his right hip for stage IV metastatic renal cell carcinoma  REFERRING PROVIDER: Earlie Server, MD  HPI: Patient is a 57 year old male now seen out 1 month having completed hypofractionated palliative ration therapy to his right hip for stage IV renal cell carcinoma. He had an MRI evidence of metastatic disease in his right hip although not a significant burden. We treated with a single course of 800 cGy. He's had no side effects although he states his pain is worsened. Patient does have history of . Substance abuse and is being monitored for his narcotic use. He is ambulating without assistance. Patient is also prior had palliative radiation therapy to his chest specifically denies cough hemoptysis or chest tightness.  COMPLICATIONS OF TREATMENT: none  FOLLOW UP COMPLIANCE: keeps appointments   PHYSICAL EXAM:  BP (!) 141/87   Pulse 96   Temp (!) 97.1 F (36.2 C)   Resp 20   Wt 222 lb 14.2 oz (101.1 kg)   BMI 34.91 kg/m  He does have some decreased strength in his right lower extremity most likely secondary pain. Range of motion does elicit pain. Well-developed well-nourished patient in NAD. HEENT reveals PERLA, EOMI, discs not visualized.  Oral cavity is clear. No oral mucosal lesions are identified. Neck is clear without evidence of cervical or supraclavicular adenopathy. Lungs are clear to A&P. Cardiac examination is essentially unremarkable with regular rate and rhythm without murmur rub or thrill. Abdomen is benign with no organomegaly or masses noted. Motor sensory and DTR levels are equal and symmetric in the upper and lower extremities. Cranial nerves II through XII are grossly intact. Proprioception is intact. No peripheral adenopathy or edema is  identified. No motor or sensory levels are noted. Crude visual fields are within normal range.  RADIOLOGY RESULTS: Previous MRI scan of his right hip reviewed I've ordered plain films of his right hip  PLAN: At this time not sure of the cause of his increased pain in his right hip. [place] by his MRI scan does not have significant tumor burden there which would cause pain of this magnitude. I ordered plain films of his right hip to rule out possibility of fracture. At this time I'll turn follow-up care over to medical oncology. I would be happy to reevaluate this patient should further palliative treatment be indicated.  I would like to take this opportunity to thank you for allowing me to participate in the care of your patient.Noreene Filbert, MD

## 2018-01-30 NOTE — Progress Notes (Signed)
[facility] follow up visit  Date of visit: 01/30/18 REASON FOR VISIT Follow up for treatment of metastatic renal cell cancer.  HISTORY OF PRESENT DISEASE/ PERTINENT ONCOLOGY HISTORY 1[name] is  57 y.o. male with 40 pack year smoking histroy is here for evaluation of abnormal image finding. Patient has had right shoulder pain  three months after reaching below a bar to grab a glass. Patient is not able to raise his right arm over shoulder level.  Also having left flank pain associated with pink urine for a few days and resolved after taking a course of amoxicillin. Patient has 40 pack year smoking history.    2 Images:  06/26/2017 CT scan showed a concerning right kidney mass with possible metastatic disease with thoracic and abdominal lymphadenopathy, lung nodules, and diffuse osseous metastases. 07/15/2017 CT PE protocol for evaluation of shortness of breath or chest pain showed no PE, however right lung collapse and rapid progression of lung adenopathy.  08/10/2017 ED visit due to uncontrolled pain: CT hip showed lucent bone lesions in the left hip and left acetabulum consistent with known metastatic disease. No evidence of pathological fracture.  08/11/2017 Bone scan: FINDINGS:Numerous areas of abnormal bony uptake noted in the skull, bilateral scapulae, sternum, cervical and thoracic spine, posterior left tenth rib, and right iliac bone compatible with metastatic disease. Degenerative uptake in the ankles and feet. Soft tissue activity unremarkable. IMPRESSION:Extensive osseous metastatic disease as above.   3 Pathology:     07/10/2017 07/24/2017 A. Lung, right upper lobe, endobronchial biopsy DIAGNOSIS: A. LUNG, RIGHT UPPER LOBE; ENDOBRONCHIAL BIOPSY: - METASTATIC CARCINOMA CONSISTENT WITH CLEAR CELL RENAL CELL CARCINOMA    Bronchoscopy on 07/24/2017 and biopsy revealed metastatic renal cell cancer.   4 Treatment S/p palliative RT to lung and involved bone  mets 07/22/2017. Started nivolumab and ipilimumab, s/p 4 cycles. Started Nivolumab maintenance from 10/15/2017 to 11/26/2017 (5 cycles)   Immunotherapy was held due to radiation pneumonitis.  He has been started on steroids with slow tapering with fluctuating of his respiratory status.  On tapering course of steroids, currently on 37m daily.   # He developed new right femur lesion which was treated by Dr. CDonella Stadeon 12/26/2017.  He reports that he continued to have persistent pain of his right femur after radiation, intermittent, shooting down from his thigh.  # Started on Carbozantinib on 01/08/2018  5 Pain medication:  Patient finished one month supply of percocet in about one week. So his pain medication Rx are being dispensed weekly and he gets one week supply at one time. Multiple discussion about importance of taking pain medication as instructed.  During one of his clinic visit, patient was on the phone talking to someone and RN overheard that he said "[place] [facility] [place] [place] [facility] [place], get the money ready".  Currently he has disease progression so that his pain regimen has been recently adjusted. He is on MS contin 459mBID and Percocet 10-3253m6 hour PRN.  01/13/2018 UDS showed positive cocaine. Lauren (Cancer center NP) called him and updated his results.    INTERVAL HISTORY Larry Watkins 5o.  male with above Oncology history present for follow-up for kidney cancer. He has started on Carbozantinib about 3 weeks ago. Blood pressure was high. He presents for follow up of blood pressure control and assessment of tolerance.  He reports having right lower extremity pain, and discussed with Radonc for possible additional RT. He ambulates  without any problem  He requests refill of his pain medication.   Review of Systems  Constitutional: Positive for malaise/fatigue. Negative for fever and weight loss.  HENT: Negative for ear pain and nosebleeds.   Eyes: Negative for blurred  vision, double vision and pain.  Respiratory: Positive for shortness of breath. Negative for cough and sputum production.   Cardiovascular: Negative for chest pain and palpitations.  Gastrointestinal: Negative for abdominal pain, diarrhea, heartburn and nausea.  Genitourinary: Negative for dysuria, flank pain, hematuria and urgency.  Musculoskeletal: Negative for back pain, joint pain and neck pain.       Right hip pain improves.  right mid thigh pain is worse.  Skin: Negative for itching and rash.  Neurological: Negative for dizziness, tingling, sensory change and weakness.  Endo/Heme/Allergies: Does not bruise/bleed easily.  Psychiatric/Behavioral: Negative for suicidal ideas. The patient is not nervous/anxious.      MEDICAL HISTORY: Past Medical History:  Diagnosis Date  . Bone metastasis ([place]) 07/17/2017  . Bone metastasis ([place]) 07/17/2017  . Metastatic renal cell carcinoma ([place])     SURGICAL HISTORY: Past Surgical History:  Procedure Laterality Date  . FLEXIBLE BRONCHOSCOPY N/A 07/24/2017   Procedure: FLEXIBLE BRONCHOSCOPY;  Surgeon: [name], MD;  Location: [facility] ORS;  Service: Pulmonary;  Laterality: N/A;  . HERNIA REPAIR    . left middle amputation      with reattachment    SOCIAL HISTORY: Social History   Socioeconomic History  . Marital status: Single    Spouse name: Not on file  . Number of children: Not on file  . Years of education: Not on file  . Highest education level: Not on file  Social Needs  . Financial resource strain: Not on file  . Food insecurity - worry: Not on file  . Food insecurity - inability: Not on file  . Transportation needs - medical: Not on file  . Transportation needs - non-medical: Not on file  Occupational History  . Not on file  Tobacco Use  . Smoking status: Current Every Day Smoker    Packs/day: 0.25    Years: 38.00    Pack years: 9.50  . Smokeless tobacco: Never Used  Substance and Sexual Activity  . Alcohol use:  Yes    Comment: 1-2 beers, mixed drinks on weekends  . Drug use: Yes    Types: "Crack" cocaine    Comment: Patient tested positive for cocaine on 01/12/18  . Sexual activity: Not on file  Other Topics Concern  . Not on file  Social History Narrative   Independent at baseline.    FAMILY HISTORY Family History  Problem Relation Age of Onset  . Cancer Paternal Uncle        brain    ALLERGIES:  has No Known Allergies.  MEDICATIONS:  Current Outpatient Medications  Medication Sig Dispense Refill  . albuterol (PROVENTIL HFA;VENTOLIN HFA) 108 (90 Base) MCG/ACT inhaler Inhale 2 puffs into the lungs every 6 (six) hours as needed for wheezing or shortness of breath. 1 Inhaler 2  . amLODipine (NORVASC) 2.5 MG tablet Take 1 tablet (2.5 mg total) by mouth daily. 30 tablet 0  . cabozantinib S-Malate (CABOMETYX) 60 MG TABS Take 60 mg by mouth daily. 30 tablet 3  . gabapentin (NEURONTIN) 300 MG capsule Take 1 capsule (300 mg total) by mouth 3 (three) times daily. 90 capsule 3  . morphine (MS CONTIN) 15 MG 12 hr tablet Take 1 tablet (15 mg total) by mouth every  12 (twelve) hours. 28 tablet 0  . morphine (MS CONTIN) 30 MG 12 hr tablet Take 1 tablet (30 mg total) by mouth every 12 (twelve) hours. 28 tablet 0  . omeprazole (PRILOSEC) 20 MG capsule Take 1 capsule (20 mg total) by mouth daily. 30 capsule 6  . ondansetron (ZOFRAN ODT) 4 MG disintegrating tablet Take 1 tablet (4 mg total) by mouth every 8 (eight) hours as needed for nausea or vomiting. 90 tablet 3  . oxyCODONE-acetaminophen (PERCOCET) 10-325 MG tablet Take 1 tablet by mouth every 6 (six) hours as needed for pain. 112 tablet 0  . predniSONE (DELTASONE) 10 MG tablet Take 1 tablet (10 mg total) by mouth daily with breakfast. 10 tablet 0  . tiotropium (SPIRIVA HANDIHALER) 18 MCG inhalation capsule Place 1 capsule (18 mcg total) into inhaler and inhale daily. 30 capsule 12   No current facility-administered medications for this visit.     Vitals:   01/30/18 1034  BP: 122/78  Pulse: 90  Resp: 18  Temp: 98.1 F (36.7 C)  SpO2: 99%   Filed Weights   01/30/18 1034  Weight: 223 lb (101.2 kg)   Physical Exam  Constitutional: He is oriented to person, place, and time. He appears well-developed. No distress.  HENT:  Head: Normocephalic and atraumatic.  Mouth/Throat: No oropharyngeal exudate.  Eyes: EOM are normal. Pupils are equal, round, and reactive to light. Left eye exhibits no discharge.  Neck: Neck supple. No JVD present.  Cardiovascular: Normal rate, regular rhythm and normal heart sounds. Exam reveals no friction rub.  No murmur heard. Pulmonary/Chest: Effort normal. No respiratory distress. He exhibits no tenderness.  minimal wheezing today on right side.  Left side clear.   Abdominal: Soft. Bowel sounds are normal.  Musculoskeletal: Normal range of motion. He exhibits no deformity.  Lymphadenopathy:    He has no cervical adenopathy.  Neurological: He is alert and oriented to person, place, and time. He displays normal reflexes. No cranial nerve deficit. Coordination normal.  Skin: Skin is warm and dry. No erythema.  Psychiatric: He has a normal mood and affect. His behavior is normal.    LABORATORY DATA: I have personally reviewed the data as listed: CBC    Component Value Date/Time   WBC 6.3 01/30/2018 0956   RBC 4.50 01/30/2018 0956   HGB 13.0 01/30/2018 0956   HCT 37.4 (L) 01/30/2018 0956   PLT 288 01/30/2018 0956   MCV 83.1 01/30/2018 0956   MCH 28.8 01/30/2018 0956   MCHC 34.7 01/30/2018 0956   RDW 16.0 (H) 01/30/2018 0956   LYMPHSABS 0.9 (L) 01/30/2018 0956   MONOABS 0.4 01/30/2018 0956   EOSABS 0.1 01/30/2018 0956   BASOSABS 0.1 01/30/2018 0956   CMP Latest Ref Rng & Units 01/30/2018 01/22/2018 01/13/2018  Glucose 65 - 99 mg/dL 115(H) 125(H) 156(H)  BUN 6 - 20 mg/dL _0 Creatinine 0.61 - 1.24 mg/dL 0.76 0.85 0.81  Sodium 135 - 145 mmol/L 139 136 138  Potassium 3.5 - 5.1 mmol/L  3.9 5.0 4.0  Chloride 101 - 111 mmol/L 102 99(L) 103  CO2 22 - 32 mmol/L _1 Calcium 8.9 - 10.3 mg/dL 8.7(L) 9.2 8.7(L)  Total Protein 6.5 - 8.1 g/dL 7.3 7.7 6.8  Total Bilirubin 0.3 - 1.2 mg/dL 0.5 0.3 0.3  Alkaline Phos 38 - 126 U/L 104 108 86  AST 15 - 41 U/L _2 ALT 17 - 63 U/L 20 27 17  RADIOGRAPHIC STUDIES: I have personally reviewed the radiological images as listed and agree with the findings in the report MRI 12/15/2017. Patient has multiple metastatic deposits identified. He hasn't a lesion in the inferior aspect of the base of the right femoral neck measuring 1.8 cm cranial caudal by 0.7 cm AP by approximately 1.3 cm transverse. Largest visualized lesion is in the anterior right ilium measuring 4 cm transverse by 5.2 cm AP by 4.6 cm cranial caudal. There is also a lesion in the left femur measuring 2.7 and a meter by 2.0 cm x 2.5 cm. Additional scattered lesions are identified including punctate lesion in the posterior right acetabulum and essentially in the femoral head.  CT 12/26/2017 1. Stable appearance of large mass in the lower pole of the right kidney. 2. Evolving postradiation changes in the right lung. Several enlarging and new nodular appearing areas in the right lung, as above. Whether or not these reflect evolving metastatic disease or evolving postradiation changes is uncertain. Continued attention on follow-up studies is recommended. 3. Extensive mediastinal and hilar lymphadenopathy redemonstrated,as above. Similar appearance of retroperitoneal lymphadenopathycompared to the prior study. No other new signs of metastaticdisease elsewhere in the abdomen or pelvis.  4. Widespread metastatic disease to the bones very similar to the prior examination. 5. Aortic atherosclerosis, in addition to left anterior descending coronary artery disease. Please note that although the presence of coronary artery calcium documents the presence of coronary artery disease, the  severity of this disease and any potential stenosis cannot be assessed on this non-gated CT examination. Assessment for potential risk factor modification, dietary therapy or pharmacologic therapy may be warranted, if clinically indicated.   ASSESSMENT/PLAN Cancer Staging Renal cell carcinoma of right kidney [facility]) Staging form: Kidney, AJCC 8th Edition - Clinical stage from 07/14/2017: Stage IV (cT3a, cNX, pM1) - Signed by Earlie Server, MD on 07/15/2017 - Pathologic: No stage assigned - Unsigned  1. Renal cell carcinoma of right kidney ([place])   # Renal cell carcinoma, continue Carbozantinib. Tolerates well except hypertension which is now controlled with Norvasc 2.66m daily.   #Radiation pneumonitis: He reports feeling well today, on 151mevery other day. Advise patient to stop taking steroids.   Continue GI prophylaxis. Continue Albuterol PRN.   #Right hip pain with MRI showing new right femur neck lesion.  Status post palliative radiation.  He has been pn  MS Contin 45 mg every 12 hours, and Percocet every 4-6 hours as needed.   # Substance abuse: He requests refilling pain medication and signed pain contract.  I ask him to give a sample of urine for UDS testing before refilling his pain medication. He declined and says" I am not ready today to be tested".   # Xgeva monthly.  CBC/CMP in one week and assessment for control of blood pressure.   # All questions were answered. The patient knows to call the clinic with any problems, questions or concerns.. Earlie ServerMD, PhD Hematology Oncology [facility]- [phone]/12/2017

## 2018-02-02 ENCOUNTER — Emergency Department: Payer: Medicaid Other

## 2018-02-02 ENCOUNTER — Observation Stay
Admission: EM | Admit: 2018-02-02 | Discharge: 2018-02-03 | Disposition: A | Discharge status: Home / Self Care | Payer: Medicaid Other | Attending: Internal Medicine | Admitting: Internal Medicine

## 2018-02-02 ENCOUNTER — Other Ambulatory Visit: Payer: Self-pay

## 2018-02-02 ENCOUNTER — Inpatient Hospital Stay: Payer: Medicaid Other

## 2018-02-02 DIAGNOSIS — M25551 Pain in right hip: Secondary | ICD-10-CM | POA: Diagnosis present

## 2018-02-02 DIAGNOSIS — F17210 Nicotine dependence, cigarettes, uncomplicated: Secondary | ICD-10-CM | POA: Diagnosis not present

## 2018-02-02 DIAGNOSIS — C7951 Secondary malignant neoplasm of bone: Secondary | ICD-10-CM | POA: Diagnosis not present

## 2018-02-02 DIAGNOSIS — M898X9 Other specified disorders of bone, unspecified site: Secondary | ICD-10-CM | POA: Diagnosis not present

## 2018-02-02 DIAGNOSIS — J449 Chronic obstructive pulmonary disease, unspecified: Secondary | ICD-10-CM | POA: Insufficient documentation

## 2018-02-02 DIAGNOSIS — R52 Pain, unspecified: Secondary | ICD-10-CM | POA: Diagnosis present

## 2018-02-02 DIAGNOSIS — G893 Neoplasm related pain (acute) (chronic): Secondary | ICD-10-CM

## 2018-02-02 DIAGNOSIS — C641 Malignant neoplasm of right kidney, except renal pelvis: Secondary | ICD-10-CM | POA: Diagnosis not present

## 2018-02-02 DIAGNOSIS — F1910 Other psychoactive substance abuse, uncomplicated: Secondary | ICD-10-CM

## 2018-02-02 DIAGNOSIS — I10 Essential (primary) hypertension: Secondary | ICD-10-CM | POA: Insufficient documentation

## 2018-02-02 DIAGNOSIS — M898X5 Other specified disorders of bone, thigh: Principal | ICD-10-CM | POA: Insufficient documentation

## 2018-02-02 DIAGNOSIS — M79651 Pain in right thigh: Secondary | ICD-10-CM

## 2018-02-02 LAB — CBC WITH DIFFERENTIAL/PLATELET
Basophils Absolute: 0.1 10*3/uL (ref 0–0.1)
Basophils Relative: 1 %
EOS PCT: 2 %
Eosinophils Absolute: 0.1 10*3/uL (ref 0–0.7)
HCT: 37.4 % — ABNORMAL LOW (ref 40.0–52.0)
Hemoglobin: 12.5 g/dL — ABNORMAL LOW (ref 13.0–18.0)
LYMPHS PCT: 14 %
Lymphs Abs: 0.9 10*3/uL — ABNORMAL LOW (ref 1.0–3.6)
MCH: 27.7 pg (ref 26.0–34.0)
MCHC: 33.5 g/dL (ref 32.0–36.0)
MCV: 82.9 fL (ref 80.0–100.0)
Monocytes Absolute: 0.4 10*3/uL (ref 0.2–1.0)
Monocytes Relative: 7 %
Neutro Abs: 4.9 10*3/uL (ref 1.4–6.5)
Neutrophils Relative %: 76 %
PLATELETS: 259 10*3/uL (ref 150–440)
RBC: 4.52 MIL/uL (ref 4.40–5.90)
RDW: 16.1 % — ABNORMAL HIGH (ref 11.5–14.5)
WBC: 6.5 10*3/uL (ref 3.8–10.6)

## 2018-02-02 LAB — COMPREHENSIVE METABOLIC PANEL WITH GFR
ALT: 18 U/L (ref 17–63)
AST: 20 U/L (ref 15–41)
Albumin: 2.8 g/dL — ABNORMAL LOW (ref 3.5–5.0)
Alkaline Phosphatase: 100 U/L (ref 38–126)
Anion gap: 10 (ref 5–15)
BUN: 16 mg/dL (ref 6–20)
CO2: 24 mmol/L (ref 22–32)
Calcium: 8.5 mg/dL — ABNORMAL LOW (ref 8.9–10.3)
Chloride: 103 mmol/L (ref 101–111)
Creatinine, Ser: 0.7 mg/dL (ref 0.61–1.24)
GFR calc Af Amer: >60 mL/min (ref >60)
GFR calc non Af Amer: >60 mL/min (ref >60)
Glucose, Bld: 151 mg/dL — ABNORMAL HIGH (ref 65–99)
Potassium: 3.5 mmol/L (ref 3.5–5.1)
Sodium: 137 mmol/L (ref 135–145)
Total Bilirubin: 0.4 mg/dL (ref 0.3–1.2)
Total Protein: 6.7 g/dL (ref 6.5–8.1)

## 2018-02-02 LAB — TSH: TSH: 3.225 u[IU]/mL (ref 0.350–4.500)

## 2018-02-02 MED ORDER — ONDANSETRON HCL 4 MG/2ML IJ SOLN
4.0000 mg | Freq: Once | INTRAMUSCULAR | Status: AC
  Administered 2018-02-02: 4 mg via INTRAVENOUS
  Filled 2018-02-02: qty 2

## 2018-02-02 MED ORDER — AMLODIPINE BESYLATE 5 MG PO TABS
2.5000 mg | ORAL_TABLET | Freq: Every day | ORAL | Status: DC
  Administered 2018-02-02 – 2018-02-03 (×2): 2.5 mg via ORAL
  Filled 2018-02-02 (×2): qty 1

## 2018-02-02 MED ORDER — ENOXAPARIN SODIUM 40 MG/0.4ML ~~LOC~~ SOLN
40.0000 mg | SUBCUTANEOUS | Status: DC
  Administered 2018-02-03: 40 mg via SUBCUTANEOUS
  Filled 2018-02-02 (×2): qty 0.4

## 2018-02-02 MED ORDER — OXYCODONE-ACETAMINOPHEN 10-325 MG PO TABS: 1.0000 | ORAL_TABLET | Freq: Four times a day (QID) | ORAL | Status: DC | PRN

## 2018-02-02 MED ORDER — HYDROMORPHONE HCL 1 MG/ML IJ SOLN
0.5000 mg | INTRAMUSCULAR | Status: DC | PRN
  Administered 2018-02-02 (×3): 0.5 mg via INTRAVENOUS
  Filled 2018-02-02 (×4): qty 1

## 2018-02-02 MED ORDER — TIOTROPIUM BROMIDE MONOHYDRATE 18 MCG IN CAPS
18.0000 ug | ORAL_CAPSULE | Freq: Every day | RESPIRATORY_TRACT | Status: DC
  Administered 2018-02-02 – 2018-02-03 (×2): 18 ug via RESPIRATORY_TRACT
  Filled 2018-02-02: qty 5

## 2018-02-02 MED ORDER — KETAMINE HCL 10 MG/ML IJ SOLN
20.0000 mg | Freq: Once | INTRAMUSCULAR | Status: AC
Start: 2018-02-02 — End: 2018-02-02
  Administered 2018-02-02: 20 mg via INTRAVENOUS
  Filled 2018-02-02: qty 1

## 2018-02-02 MED ORDER — LIDOCAINE 5 % EX PTCH
1.0000 | MEDICATED_PATCH | CUTANEOUS | Status: DC
  Administered 2018-02-02 – 2018-02-03 (×2): 1 via TRANSDERMAL
  Filled 2018-02-02 (×2): qty 1

## 2018-02-02 MED ORDER — MORPHINE SULFATE (PF) 10 MG/ML IV SOLN
10.0000 mg | Freq: Once | INTRAVENOUS | Status: AC
  Administered 2018-02-02: 10 mg via INTRAVENOUS
  Filled 2018-02-02: qty 1

## 2018-02-02 MED ORDER — ACETAMINOPHEN 325 MG PO TABS: 650.0000 mg | ORAL_TABLET | Freq: Four times a day (QID) | ORAL | Status: DC | PRN

## 2018-02-02 MED ORDER — OXYCODONE-ACETAMINOPHEN 5-325 MG PO TABS
1.0000 | ORAL_TABLET | Freq: Four times a day (QID) | ORAL | Status: DC | PRN
  Administered 2018-02-02 (×2): 1 via ORAL
  Filled 2018-02-02 (×2): qty 1

## 2018-02-02 MED ORDER — PANTOPRAZOLE SODIUM 40 MG PO TBEC
40.0000 mg | DELAYED_RELEASE_TABLET | Freq: Every day | ORAL | Status: DC
  Administered 2018-02-02 – 2018-02-03 (×2): 40 mg via ORAL
  Filled 2018-02-02 (×2): qty 1

## 2018-02-02 MED ORDER — ACETAMINOPHEN 650 MG RE SUPP: 650.0000 mg | Freq: Four times a day (QID) | RECTAL | Status: DC | PRN

## 2018-02-02 MED ORDER — DOCUSATE SODIUM 100 MG PO CAPS
100.0000 mg | ORAL_CAPSULE | Freq: Two times a day (BID) | ORAL | Status: DC
  Administered 2018-02-02 – 2018-02-03 (×2): 100 mg via ORAL
  Filled 2018-02-02 (×3): qty 1

## 2018-02-02 MED ORDER — ONDANSETRON HCL 4 MG/2ML IJ SOLN: 4.0000 mg | Freq: Four times a day (QID) | INTRAMUSCULAR | Status: DC | PRN

## 2018-02-02 MED ORDER — OXYCODONE HCL 5 MG PO TABS
5.0000 mg | ORAL_TABLET | Freq: Four times a day (QID) | ORAL | Status: DC | PRN
  Administered 2018-02-02 (×2): 5 mg via ORAL
  Filled 2018-02-02 (×2): qty 1

## 2018-02-02 MED ORDER — SODIUM CHLORIDE 0.9 % IV SOLN
INTRAVENOUS | Status: DC
  Administered 2018-02-02 – 2018-02-03 (×4): via INTRAVENOUS

## 2018-02-02 MED ORDER — CABOZANTINIB S-MALATE 60 MG PO TABS
60.0000 mg | ORAL_TABLET | Freq: Every day | ORAL | Status: DC
  Administered 2018-02-02: 23:00:00 60 mg via ORAL
  Filled 2018-02-02: qty 1

## 2018-02-02 MED ORDER — GABAPENTIN 300 MG PO CAPS
300.0000 mg | ORAL_CAPSULE | Freq: Three times a day (TID) | ORAL | Status: DC
  Administered 2018-02-02 – 2018-02-03 (×5): 300 mg via ORAL
  Filled 2018-02-02 (×5): qty 1

## 2018-02-02 MED ORDER — DOCUSATE SODIUM 100 MG PO CAPS
100.0000 mg | ORAL_CAPSULE | Freq: Two times a day (BID) | ORAL | Status: DC
  Administered 2018-02-02: 09:00:00 100 mg via ORAL

## 2018-02-02 MED ORDER — MORPHINE SULFATE ER 15 MG PO TBCR
15.0000 mg | EXTENDED_RELEASE_TABLET | Freq: Two times a day (BID) | ORAL | Status: DC
  Administered 2018-02-02 – 2018-02-03 (×3): 15 mg via ORAL
  Filled 2018-02-02 (×3): qty 1

## 2018-02-02 MED ORDER — ONDANSETRON HCL 4 MG PO TABS: 4.0000 mg | ORAL_TABLET | Freq: Four times a day (QID) | ORAL | Status: DC | PRN

## 2018-02-02 MED ORDER — ALBUTEROL SULFATE (2.5 MG/3ML) 0.083% IN NEBU: 2.5000 mg | INHALATION_SOLUTION | RESPIRATORY_TRACT | Status: DC | PRN

## 2018-02-02 MED ORDER — OXYCODONE HCL 5 MG PO TABS
10.0000 mg | ORAL_TABLET | ORAL | Status: DC | PRN
  Administered 2018-02-02 – 2018-02-03 (×5): 10 mg via ORAL
  Filled 2018-02-02 (×5): qty 2

## 2018-02-02 MED ORDER — POLYETHYLENE GLYCOL 3350 17 G PO PACK
17.0000 g | PACK | Freq: Every day | ORAL | Status: DC
  Administered 2018-02-02: 17 g via ORAL
  Filled 2018-02-02 (×2): qty 1

## 2018-02-02 MED ORDER — HYDROMORPHONE HCL 1 MG/ML IJ SOLN
1.0000 mg | INTRAMUSCULAR | Status: DC | PRN
  Administered 2018-02-02 – 2018-02-03 (×3): 1 mg via INTRAVENOUS
  Filled 2018-02-02 (×3): qty 1

## 2018-02-02 MED ORDER — KETOROLAC TROMETHAMINE 30 MG/ML IJ SOLN
15.0000 mg | Freq: Once | INTRAMUSCULAR | Status: AC
  Administered 2018-02-02: 15 mg via INTRAVENOUS
  Filled 2018-02-02: qty 1

## 2018-02-02 MED ORDER — PREDNISONE 10 MG PO TABS
10.0000 mg | ORAL_TABLET | Freq: Every day | ORAL | Status: DC
  Administered 2018-02-02 – 2018-02-03 (×2): 10 mg via ORAL
  Filled 2018-02-02 (×2): qty 1

## 2018-02-02 MED ORDER — MORPHINE SULFATE ER 30 MG PO TBCR
30.0000 mg | EXTENDED_RELEASE_TABLET | Freq: Two times a day (BID) | ORAL | Status: DC
  Administered 2018-02-02 – 2018-02-03 (×3): 30 mg via ORAL
  Filled 2018-02-02 (×3): qty 1

## 2018-02-02 NOTE — ED Notes (Signed)
AAOx3.  Skin warm and dry.  NAD 

## 2018-02-02 NOTE — Progress Notes (Signed)
PT Cancellation Note  Patient Details Name: Larry Watkins MRN: [id number] DOB: [birth year]   Cancelled Treatment:    Reason Eval/Treat Not Completed: PT screened, no needs identified, will sign off  Pt demonstrated BUE and BLE AROM WNL and strength at least 3/5. Pt demonstrated transfers and ambulation 100 ft independently with increased gait speed (pt required vc's to decreased gait speed for safety d/t IV pole).  Attempted to trial SPC use to decrease pain in R hip (pt used for approximately 5 feet before handing it back to therapist). Pt appearing impulsive and not receptive to attempted PT session.  Pt not interested in any DME at this time (has RW at home if needed). Pt reported 7/10 R hip pain end of session (pt reporting pain increased with transfers and ambulation).  RN notified of pain.  Rodderick Holtzer Mondrian-Pardue , SPT 02/02/2018, 3:00 PM

## 2018-02-02 NOTE — H&P (Signed)
Larry Watkins is an 57 y.o. male.   Chief Complaint: Hip pain HPI: The patient with past medical history of stage IV renal cell carcinoma presents to the emergency department in severe pain.  The patient localizes the pain to his right hip but states that it radiates down the entire right leg.  He has also had pain in his back with intermittent periods of pain elsewhere as well.  He denies trauma.  The patient received multiple doses of IV narcotics in the emergency department without relief which prompted the ED staff to call the hospitalist service for admission.  Past Medical History:  Diagnosis Date  . Bone metastasis ([place]) 07/17/2017  . Bone metastasis ([place]) 07/17/2017  . Metastatic renal cell carcinoma [facility])     Past Surgical History:  Procedure Laterality Date  . FLEXIBLE BRONCHOSCOPY N/A 07/24/2017   Procedure: FLEXIBLE BRONCHOSCOPY;  Surgeon: [name], MD;  Location: [facility] ORS;  Service: Pulmonary;  Laterality: N/A;  . HERNIA REPAIR    . left middle amputation      with reattachment    Family History  Problem Relation Age of Onset  . Cancer Paternal Uncle        brain   Social History:  reports that he has been smoking.  He has a 9.50 pack-year smoking history. he has never used smokeless tobacco. He reports that he drinks alcohol. He reports that he uses drugs. Drug: "Crack" cocaine.  Allergies: No Known Allergies  Medications Prior to Admission  Medication Sig Dispense Refill  . albuterol (PROVENTIL HFA;VENTOLIN HFA) 108 (90 Base) MCG/ACT inhaler Inhale 2 puffs into the lungs every 6 (six) hours as needed for wheezing or shortness of breath. 1 Inhaler 2  . amLODipine (NORVASC) 2.5 MG tablet Take 1 tablet (2.5 mg total) by mouth daily. 30 tablet 0  . cabozantinib S-Malate (CABOMETYX) 60 MG TABS Take 60 mg by mouth daily. 30 tablet 3  . gabapentin (NEURONTIN) 300 MG capsule Take 1 capsule (300 mg total) by mouth 3 (three) times daily. 90 capsule 3  . morphine (MS  CONTIN) 15 MG 12 hr tablet Take 1 tablet (15 mg total) by mouth every 12 (twelve) hours. 28 tablet 0  . morphine (MS CONTIN) 30 MG 12 hr tablet Take 1 tablet (30 mg total) by mouth every 12 (twelve) hours. 28 tablet 0  . omeprazole (PRILOSEC) 20 MG capsule Take 1 capsule (20 mg total) by mouth daily. 30 capsule 6  . ondansetron (ZOFRAN ODT) 4 MG disintegrating tablet Take 1 tablet (4 mg total) by mouth every 8 (eight) hours as needed for nausea or vomiting. 90 tablet 3  . oxyCODONE-acetaminophen (PERCOCET) 10-325 MG tablet Take 1 tablet by mouth every 6 (six) hours as needed for pain. 112 tablet 0  . predniSONE (DELTASONE) 10 MG tablet Take 1 tablet (10 mg total) by mouth daily with breakfast. 10 tablet 0  . tiotropium (SPIRIVA HANDIHALER) 18 MCG inhalation capsule Place 1 capsule (18 mcg total) into inhaler and inhale daily. 30 capsule 12    Results for orders placed or performed during the hospital encounter of 02/02/18 (from the past 48 hour(s))  Comprehensive metabolic panel     Status: Abnormal   Collection Time: 02/02/18  5:39 AM  Result Value Ref Range   Sodium 137 135 - 145 mmol/L   Potassium 3.5 3.5 - 5.1 mmol/L   Chloride 103 101 - 111 mmol/L   CO2 24 22 - 32 mmol/L   Glucose, Bld  151 (H) 65 - 99 mg/dL   BUN 16 6 - 20 mg/dL   Creatinine, Ser 0.70 0.61 - 1.24 mg/dL   Calcium 8.5 (L) 8.9 - 10.3 mg/dL   Total Protein 6.7 6.5 - 8.1 g/dL   Albumin 2.8 (L) 3.5 - 5.0 g/dL   AST 20 15 - 41 U/L   ALT 18 17 - 63 U/L   Alkaline Phosphatase 100 38 - 126 U/L   Total Bilirubin 0.4 0.3 - 1.2 mg/dL   GFR calc non Af Amer >60 >60 mL/min   GFR calc Af Amer >60 >60 mL/min    Comment: (NOTE) The eGFR has been calculated using the CKD EPI equation. This calculation has not been validated in all clinical situations. eGFR's persistently <60 mL/min signify possible Chronic Kidney Disease.    Anion gap 10 5 - 15    Comment: Performed at [facility], [address] [place],  [place] [address]  CBC with Differential     Status: Abnormal   Collection Time: 02/02/18  5:39 AM  Result Value Ref Range   WBC 6.5 3.8 - 10.6 K/uL   RBC 4.52 4.40 - 5.90 MIL/uL   Hemoglobin 12.5 (L) 13.0 - 18.0 g/dL   HCT 37.4 (L) 40.0 - 52.0 %   MCV 82.9 80.0 - 100.0 fL   MCH 27.7 26.0 - 34.0 pg   MCHC 33.5 32.0 - 36.0 g/dL   RDW 16.1 (H) 11.5 - 14.5 %   Platelets 259 150 - 440 K/uL   Neutrophils Relative % 76 %   Neutro Abs 4.9 1.4 - 6.5 K/uL   Lymphocytes Relative 14 %   Lymphs Abs 0.9 (L) 1.0 - 3.6 K/uL   Monocytes Relative 7 %   Monocytes Absolute 0.4 0.2 - 1.0 K/uL   Eosinophils Relative 2 %   Eosinophils Absolute 0.1 0 - 0.7 K/uL   Basophils Relative 1 %   Basophils Absolute 0.1 0 - 0.1 K/uL    Comment: Performed at [facility], [address], [place],  [address]   Dg Hip Unilat With Pelvis 2-3 Views Right  Result Date: 02/02/2018 CLINICAL DATA:  Right hip/leg pain. History of osseous metastatic disease. EXAM: DG HIP (WITH OR WITHOUT PELVIS) 2-3V RIGHT COMPARISON:  Right hip MRI 12/15/2017 FINDINGS: Many of the bone lesions on prior MRI are not well seen radiographically. There is a 6 cm expansile lytic lesion of the right anterior iliac spine. No evidence of acute or pathologic fracture. Femoral head remains seated. IMPRESSION: 1. Expansile lytic lesion of the right anterior iliac spine. 2. Many of the smaller bone lesions on prior MRI are not well seen radiographically. 3. No evidence of acute or pathologic fracture. Electronically Signed   By: Jeb Levering M.D.   On: 02/02/2018 06:23    Review of Systems  Constitutional: Negative for chills and fever.  HENT: Negative for sore throat and tinnitus.   Eyes: Negative for blurred vision and redness.  Respiratory: Negative for cough and shortness of breath.   Cardiovascular: Negative for chest pain, palpitations, orthopnea and PND.  Gastrointestinal: Negative for abdominal pain, diarrhea, nausea and vomiting.   Genitourinary: Negative for dysuria, frequency and urgency.  Musculoskeletal: Positive for joint pain. Negative for myalgias.  Skin: Negative for rash.       No lesions  Neurological: Negative for speech change, focal weakness and weakness.  Endo/Heme/Allergies: Does not bruise/bleed easily.       No temperature intolerance  Psychiatric/Behavioral: Negative  for depression and suicidal ideas.    Blood pressure (!) 145/85, pulse 85, temperature 98.2 F (36.8 C), temperature source Oral, resp. rate 18, height _0  (1.702 m), weight 101.2 kg (223 lb), SpO2 96 %. Physical Exam  Constitutional: He is oriented to person, place, and time. He appears well-developed and well-nourished. No distress.  HENT:  Head: Normocephalic and atraumatic.  Mouth/Throat: Oropharynx is clear and moist.  Eyes: Conjunctivae and EOM are normal. Pupils are equal, round, and reactive to light. No scleral icterus.  Neck: Normal range of motion. Neck supple. No JVD present. No tracheal deviation present. No thyromegaly present.  Cardiovascular: Normal rate, regular rhythm and normal heart sounds. Exam reveals no gallop and no friction rub.  No murmur heard. Respiratory: Effort normal and breath sounds normal. No respiratory distress.  GI: Soft. Bowel sounds are normal. He exhibits no distension. There is no tenderness.  Genitourinary:  Genitourinary Comments: Deferred  Musculoskeletal: Normal range of motion. He exhibits no edema.  Lymphadenopathy:    He has no cervical adenopathy.  Neurological: He is alert and oriented to person, place, and time. No cranial nerve deficit.  Skin: Skin is warm and dry. No rash noted. No erythema.  Psychiatric: He has a normal mood and affect. His behavior is normal. Judgment and thought content normal.     Assessment/Plan This is a 57 year old male admitted for intractable pain. 1.  Intractable pain: Patient has received multiple doses of IV narcotics in the emergency  department in addition to ketamine.  The latter finally his pain down from 10 to a 3.  I have added Dilaudid for breakthrough pain.  Monitor telemetry due to high doses of narcotics. 2.  Metastatic renal cell carcinoma: No new lytic bone lesions.  Goal is to manage pain as above.  Continue chemotherapy per home regimen 3.  Hypertension: Uncontrolled; secondary to pain.  Continue amlodipine. 4.  COPD: Controlled; continue Spiriva.  Albuterol as needed. 5.  DVT prophylaxis: Lovenox 6.  GI prophylaxis: Pantoprazole per home regimen The patient is a full code.  Time spent on admission orders and patient care approximately 45 minutes  Harrie Foreman, MD 02/02/2018, 8:02 AM

## 2018-02-02 NOTE — ED Notes (Signed)
Pt states no change in pain

## 2018-02-02 NOTE — ED Triage Notes (Addendum)
Pt states has bone cancer in right leg. Pt states took  oxycodone 30mg  and morphine 15mg  at 2230 yesterday. Pt states has "severe" pain in right leg. Pt states "I can't take it anymore".

## 2018-02-02 NOTE — Progress Notes (Signed)
Patient's chart reviewed.  Patient here with intractable right hip pain. Patient was seen by oncologist on March 1 due to right hip pain. MRI has already been performed.  I will consult palliative care for pain management as well as oncology.  Continue all other orders by admitting M.D.

## 2018-02-02 NOTE — ED Provider Notes (Signed)
[facility] Emergency Department Provider Note  ____________________________________________   First MD Initiated Contact with Patient 02/02/18 [phone]     (approximate)  I have reviewed the triage vital signs and the nursing notes.   HISTORY  Chief Complaint Leg Pain    HPI Larry Watkins is a 57 y.o. male who comes to the emergency department with severe throbbing aching pain in his right hip.  The pain is severe radiating towards his right leg.  Is worse with movement and only minimally improved with rest.  His past medical history of renal cell carcinoma widely metastatic with known metastases in his right pelvis.  He took 30 mg of oxycodone along with 15 mg of oral morphine with no relief.  He denies numbness or weakness.  He denies fevers or chills.  He has never had pain this severe.  Past Medical History:  Diagnosis Date  . Bone metastasis ([place]) 07/17/2017  . Bone metastasis ([place]) 07/17/2017  . Metastatic renal cell carcinoma [facility])     Patient Active Problem List   Diagnosis Date Noted  . Cocaine abuse ([place]) 01/29/2018  . Cancer associated pain 01/12/2018  . Pneumonia 12/05/2017  . Goals of care, counseling/discussion 08/01/2017  . Metastatic renal cell carcinoma ([place]) 07/17/2017  . Bone metastasis ([place]) 07/17/2017  . Collapse of right lung 07/15/2017  . Renal cell carcinoma of right kidney ([place]) 07/14/2017    Past Surgical History:  Procedure Laterality Date  . FLEXIBLE BRONCHOSCOPY N/A 07/24/2017   Procedure: FLEXIBLE BRONCHOSCOPY;  Surgeon: [name], MD;  Location: [facility] ORS;  Service: Pulmonary;  Laterality: N/A;  . HERNIA REPAIR    . left middle amputation      with reattachment    Prior to Admission medications   Medication Sig Start Date End Date Taking? Authorizing Provider  albuterol (PROVENTIL HFA;VENTOLIN HFA) 108 (90 Base) MCG/ACT inhaler Inhale 2 puffs into the lungs every 6 (six) hours as needed for wheezing or  shortness of breath. 12/25/17   Earlie Server, MD  amLODipine (NORVASC) 2.5 MG tablet Take 1 tablet (2.5 mg total) by mouth daily. 01/22/18   Earlie Server, MD  cabozantinib S-Malate (CABOMETYX) 60 MG TABS Take 60 mg by mouth daily. 01/05/18   Earlie Server, MD  gabapentin (NEURONTIN) 300 MG capsule Take 1 capsule (300 mg total) by mouth 3 (three) times daily. 01/21/18   Earlie Server, MD  morphine (MS CONTIN) 15 MG 12 hr tablet Take 1 tablet (15 mg total) by mouth every 12 (twelve) hours. 01/19/18   Earlie Server, MD  morphine (MS CONTIN) 30 MG 12 hr tablet Take 1 tablet (30 mg total) by mouth every 12 (twelve) hours. 01/19/18   Earlie Server, MD  omeprazole (PRILOSEC) 20 MG capsule Take 1 capsule (20 mg total) by mouth daily. 01/01/18   Earlie Server, MD  ondansetron (ZOFRAN ODT) 4 MG disintegrating tablet Take 1 tablet (4 mg total) by mouth every 8 (eight) hours as needed for nausea or vomiting. 12/17/17   Earlie Server, MD  oxyCODONE-acetaminophen (PERCOCET) 10-325 MG tablet Take 1 tablet by mouth every 6 (six) hours as needed for pain. 01/06/18   Earlie Server, MD  predniSONE (DELTASONE) 10 MG tablet Take 1 tablet (10 mg total) by mouth daily with breakfast. 01/22/18   Earlie Server, MD  tiotropium (SPIRIVA HANDIHALER) 18 MCG inhalation capsule Place 1 capsule (18 mcg total) into inhaler and inhale daily. 01/12/18   Verlon Au, NP    Allergies Patient  has no known allergies.  Family History  Problem Relation Age of Onset  . Cancer Paternal Uncle        brain    Social History Social History   Tobacco Use  . Smoking status: Current Every Day Smoker    Packs/day: 0.25    Years: 38.00    Pack years: 9.50  . Smokeless tobacco: Never Used  Substance Use Topics  . Alcohol use: Yes    Comment: 1-2 beers, mixed drinks on weekends  . Drug use: Yes    Types: "Crack" cocaine    Comment: Patient tested positive for cocaine on 01/12/18    Review of Systems Constitutional: No fever/chills Eyes: No visual changes. ENT: No sore  throat. Cardiovascular: Denies chest pain. Respiratory: Denies shortness of breath. Gastrointestinal: No abdominal pain.  No nausea, no vomiting.  No diarrhea.  No constipation. Genitourinary: Negative for dysuria. Musculoskeletal: Positive for hip pain Skin: Negative for rash. Neurological: Negative for headaches, focal weakness or numbness.   ____________________________________________   PHYSICAL EXAM:  VITAL SIGNS: ED Triage Vitals [02/02/18 0415]  Enc Vitals Group     BP (!) 166/86     Pulse Rate 96     Resp (!) 22     Temp 98.2 F (36.8 C)     Temp Source Oral     SpO2 96 %     Weight 223 lb (101.2 kg)     Height _0  (1.702 m)     Head Circumference      Peak Flow      Pain Score 10     Pain Loc      Pain Edu?      Excl. in [place]?     Constitutional: Alert and oriented x4 appears miserable moaning in pain Eyes: PERRL EOMI. Head: Atraumatic. Nose: No congestion/rhinnorhea. Mouth/Throat: No trismus Neck: No stridor.   Cardiovascular: Normal rate, regular rhythm. Grossly normal heart sounds.  Good peripheral circulation. Respiratory: Normal respiratory effort.  No retractions. Lungs CTAB and moving good air Gastrointestinal: Soft nontender Musculoskeletal: No shortening or rotation of his legs although extreme discomfort when moving his right leg whatsoever.  Neurovascularly intact Neurologic:  Normal speech and language. No gross focal neurologic deficits are appreciated. Skin:  Skin is warm, dry and intact. No rash noted. Psychiatric: Mood and affect are normal. Speech and behavior are normal.    ____________________________________________   DIFFERENTIAL includes but not limited to  Bone pain, intractable pain, pathologic fracture ____________________________________________   LABS (all labs ordered are listed, but only abnormal results are displayed)  Labs Reviewed  COMPREHENSIVE METABOLIC PANEL - Abnormal; Notable for the following components:       Result Value   Glucose, Bld 151 (*)    Calcium 8.5 (*)    Albumin 2.8 (*)    All other components within normal limits  CBC WITH DIFFERENTIAL/PLATELET - Abnormal; Notable for the following components:   Hemoglobin 12.5 (*)    HCT 37.4 (*)    RDW 16.1 (*)    Lymphs Abs 0.9 (*)    All other components within normal limits    Lab work reviewed by me with no acute disease __________________________________________  EKG   ____________________________________________  RADIOLOGY  Hip x-ray reviewed by me with no obvious fracture ____________________________________________   PROCEDURES  Procedure(s) performed: no  Procedures  Critical Care performed: o  Observation: no ____________________________________________   INITIAL IMPRESSION / ASSESSMENT AND PLAN / ED COURSE  Pertinent labs & imaging  results that were available during my care of the patient were reviewed by me and considered in my medical decision making (see chart for details).  By the time I saw the patient he had already received 10 mg of IV morphine with only transient improvement in his symptoms.  He is neurologically intact fortunately.  I will give him another 10 mg now and an x-ray and reevaluate although I do anticipate he will require inpatient admission given his escalating pain requirement.  After another 10 mg of IV morphine as well as 20 mg of ketamine and 15 mg of Toradol the patient has persistent pain.  At this point he is received 4 doses of IV pain medication and his pain is not adequately controlled.  I am concerned about worsening metastases.  At this point the patient requires inpatient admission for further IV pain control.  I discussed with the patient and his wife who verbalized understanding and agreement with the plan.  I then discussed with the hospitalist Dr. Marcille Blanco who has graciously agreed to admit the patient to his service.       ____________________________________________   FINAL CLINICAL IMPRESSION(S) / ED DIAGNOSES  Final diagnoses:  Pain of right hip joint  Cancer associated pain  Bone pain      NEW MEDICATIONS STARTED DURING THIS VISIT:  New Prescriptions   No medications on file     Note:  This document was prepared using Dragon voice recognition software and may include unintentional dictation errors.     Darel Hong, MD 02/02/18 [phone]

## 2018-02-02 NOTE — Consult Note (Signed)
Hematology/Oncology Consult note [facility] [phone] [phone]  Patient Care Team: Earlie Server, MD as PCP - General (Oncology)   Name of the patient: Larry Watkins  [id number]  Jan 18, 1961   Date of visit: 02/02/18 REASON FOR COSULTATION:  Kidney cancer with intractable pain.  Pertinent Oncology History Larry Watkins is  57 y.o. male with 40 pack year smoking histroy is here for evaluation of abnormal image finding. Patient has had right shoulder pain  three months after reaching below a bar to grab a glass. Patient is not able to raise his right arm over shoulder level.  Also having left flank pain associated with pink urine for a few days and resolved after taking a course of amoxicillin. Patient has 40 pack year smoking history.    2 Images:  06/26/2017 CT scan showed a concerning right kidney mass with possible metastatic disease with thoracic and abdominal lymphadenopathy, lung nodules, and diffuse osseous metastases. 07/15/2017 CT PE protocol for evaluation of shortness of breath or chest pain showed no PE, however right lung collapse and rapid progression of lung adenopathy.  08/10/2017 ED visit due to uncontrolled pain: CT hip showed lucent bone lesions in the left hip and left acetabulum consistent with known metastatic disease. No evidence of pathological fracture.  08/11/2017 Bone scan: FINDINGS:Numerous areas of abnormal bony uptake noted in the skull, bilateral scapulae, sternum, cervical and thoracic spine, posterior left tenth rib, and right iliac bone compatible with metastatic disease. Degenerative uptake in the ankles and feet. Soft tissue activity unremarkable. IMPRESSION:Extensive osseous metastatic disease as above.   3 Pathology:     07/10/2017 07/24/2017 A. Lung, right upper lobe, endobronchial biopsy DIAGNOSIS: A. LUNG, RIGHT UPPER LOBE; ENDOBRONCHIAL BIOPSY: - METASTATIC CARCINOMA CONSISTENT WITH CLEAR CELL RENAL CELL  CARCINOMA    Bronchoscopy on 07/24/2017 and biopsy revealed metastatic renal cell cancer.   4 Treatment S/p palliative RT to lung and involved bone mets 07/22/2017. Started nivolumab and ipilimumab, s/p 4 cycles. Started Nivolumab maintenance from 10/15/2017 to 11/26/2017 (5 cycles)   Immunotherapy was held due to radiation pneumonitis.  He has been started on steroids with slow tapering with fluctuating of his respiratory status.  On tapering course of steroids, currently on 11m daily.   # He developed new right femur lesion which was treated by Dr. CDonella Stadeon 12/26/2017.  He reports that he continued to have persistent pain of his right femur after radiation, intermittent, shooting down from his thigh.  # Started on Carbozantinib on 01/08/2018  5 Pain medication:  Patient finished one month supply of percocet in about one week. So his pain medication Rx are being dispensed weekly and he gets one week supply at one time. Multiple discussion about importance of taking pain medication as instructed.  During one of his clinic visit, patient was on the phone talking to someone and RN overheard that he said "[place] get the money ready".  Currently he has disease progression so that his pain regimen has been recently adjusted. He is on MS contin 477mBID and Percocet 10-32564m6 hour PRN.  01/13/2018 UDS showed positive cocaine. Lauren (Cancer center NP) called him and updated his results.    History of presenting illness This is a 57 20ar old male with history of metastatic renal cell carcinoma known to me presented to emergency room complaining intractable right thigh pain.  Patient was recently started on cabozantinib on January 08, 2018 due to  developing new bone lesions and a possible progression of lung metastasis while on combination of nivolumab and ipilimumab.  He tolerates cabozantinib well and his respiratory symptoms has improved.  Patient was last seen  by me last week when he demands that refill of his pain medication due to worsening of right thigh pain.  That site has been recently radiated.  Given the history of a positive UDS testing for cocaine, I asked patient to obtain UDS before I refill his pain medication.  Patient declined and said "I am not ready for the test today because I want to make sure on clean.  If you not getting me the pain medication I am going to buy on the street".   Patient presented to emergency room this morning complaining intractable pain.  He received IV narcotics and ketamine in the ER without relief and was admitted for additional treatment.  He denies any injury, shortness of breath, chest pain.  Review of systems- Review of Systems  Constitutional: Negative for chills, diaphoresis, fever and weight loss.  HENT: Negative for hearing loss and nosebleeds.   Eyes: Negative for blurred vision and photophobia.  Respiratory: Negative for cough, sputum production and shortness of breath.   Cardiovascular: Negative for chest pain and orthopnea.  Gastrointestinal: Negative for heartburn.  Genitourinary: Negative for dysuria.  Musculoskeletal:       Right thigh pain  Skin: Negative for rash.  Neurological: Negative for dizziness.  Endo/Heme/Allergies: Does not bruise/bleed easily.  Psychiatric/Behavioral: Negative for substance abuse.    No Known Allergies  Patient Active Problem List   Diagnosis Date Noted  . Intractable pain 02/02/2018  . Cocaine abuse ([place]) 01/29/2018  . Cancer associated pain 01/12/2018  . Pneumonia 12/05/2017  . Goals of care, counseling/discussion 08/01/2017  . Metastatic renal cell carcinoma ([place]) 07/17/2017  . Bone metastasis ([place]) 07/17/2017  . Collapse of right lung 07/15/2017  . Renal cell carcinoma of right kidney ([place]) 07/14/2017     Past Medical History:  Diagnosis Date  . Bone metastasis ([place]) 07/17/2017  . Bone metastasis ([place]) 07/17/2017  . Metastatic renal cell  carcinoma [facility])      Past Surgical History:  Procedure Laterality Date  . FLEXIBLE BRONCHOSCOPY N/A 07/24/2017   Procedure: FLEXIBLE BRONCHOSCOPY;  Surgeon: [name], MD;  Location: [facility] ORS;  Service: Pulmonary;  Laterality: N/A;  . HERNIA REPAIR    . left middle amputation      with reattachment    Social History   Socioeconomic History  . Marital status: Single    Spouse name: Not on file  . Number of children: Not on file  . Years of education: Not on file  . Highest education level: Not on file  Social Needs  . Financial resource strain: Not on file  . Food insecurity - worry: Not on file  . Food insecurity - inability: Not on file  . Transportation needs - medical: Not on file  . Transportation needs - non-medical: Not on file  Occupational History  . Not on file  Tobacco Use  . Smoking status: Current Every Day Smoker    Packs/day: 0.25    Years: 38.00    Pack years: 9.50  . Smokeless tobacco: Never Used  Substance and Sexual Activity  . Alcohol use: Yes    Comment: 1-2 beers, mixed drinks on weekends  . Drug use: Yes    Types: "Crack" cocaine    Comment: Patient tested positive for cocaine on 01/12/18  .  Sexual activity: Not on file  Other Topics Concern  . Not on file  Social History Narrative   Independent at baseline.     Family History  Problem Relation Age of Onset  . Cancer Paternal Uncle        brain     Current Facility-Administered Medications:  .  0.9 %  sodium chloride infusion, , Intravenous, Continuous, Harrie Foreman, MD, Last Rate: 125 mL/hr at 02/02/18 1616 .  acetaminophen (TYLENOL) tablet 650 mg, 650 mg, Oral, Q6H PRN **OR** acetaminophen (TYLENOL) suppository 650 mg, 650 mg, Rectal, Q6H PRN, Harrie Foreman, MD .  albuterol (PROVENTIL) (2.5 MG/3ML) 0.083% nebulizer solution 2.5 mg, 2.5 mg, Nebulization, Q4H PRN, Harrie Foreman, MD .  amLODipine (NORVASC) tablet 2.5 mg, 2.5 mg, Oral, Daily, Harrie Foreman, MD,  2.5 mg at 02/02/18 7494 .  cabozantinib S-Malate TABS 60 mg, 60 mg, Oral, QHS, Harrie Foreman, MD .  docusate sodium (COLACE) capsule 100 mg, 100 mg, Oral, BID, Harrie Foreman, MD .  enoxaparin (LOVENOX) injection 40 mg, 40 mg, Subcutaneous, Q24H, Harrie Foreman, MD .  gabapentin (NEURONTIN) capsule 300 mg, 300 mg, Oral, TID, Harrie Foreman, MD, 300 mg at 02/02/18 0836 .  HYDROmorphone (DILAUDID) injection 0.5 mg, 0.5 mg, Intravenous, Q4H PRN, Harrie Foreman, MD, 0.5 mg at 02/02/18 1458 .  morphine (MS CONTIN) 12 hr tablet 15 mg, 15 mg, Oral, Q12H, Harrie Foreman, MD, 15 mg at 02/02/18 0836 .  morphine (MS CONTIN) 12 hr tablet 30 mg, 30 mg, Oral, Q12H, Harrie Foreman, MD, 30 mg at 02/02/18 0837 .  ondansetron (ZOFRAN) tablet 4 mg, 4 mg, Oral, Q6H PRN **OR** ondansetron (ZOFRAN) injection 4 mg, 4 mg, Intravenous, Q6H PRN, Harrie Foreman, MD .  oxyCODONE-acetaminophen (PERCOCET/ROXICET) 5-325 MG per tablet 1 tablet, 1 tablet, Oral, Q6H PRN, 1 tablet at 02/02/18 1021 **AND** oxyCODONE (Oxy IR/ROXICODONE) immediate release tablet 5 mg, 5 mg, Oral, Q6H PRN, Harrie Foreman, MD, 5 mg at 02/02/18 1612 .  pantoprazole (PROTONIX) EC tablet 40 mg, 40 mg, Oral, Daily, Harrie Foreman, MD, 40 mg at 02/02/18 0836 .  polyethylene glycol (MIRALAX / GLYCOLAX) packet 17 g, 17 g, Oral, Daily, Mody, Sital, MD, 17 g at 02/02/18 0835 .  predniSONE (DELTASONE) tablet 10 mg, 10 mg, Oral, Q breakfast, Harrie Foreman, MD, 10 mg at 02/02/18 0836 .  tiotropium (SPIRIVA) inhalation capsule 18 mcg, 18 mcg, Inhalation, Daily, Harrie Foreman, MD, 18 mcg at 02/02/18 4967   Physical exam:  Vitals:   02/02/18 0619 02/02/18 0647 02/02/18 0801 02/02/18 1343  BP: (!) 176/108 (!) 162/93 (!) 145/85 (!) 151/91  Pulse: 91 88 85 81  Resp: _0 Temp:   98.2 F (36.8 C) 98 F (36.7 C)  TempSrc:   Oral Oral  SpO2: 98% 93% 96% 98%  Weight:   220 lb 9.6 oz (100.1 kg)   Height:   5'  7" (1.702 m)    GENERAL:Alert, no distress and comfortable.  EYES: no pallor or icterus OROPHARYNX: no thrush or ulceration; good dentition  NECK: supple, no masses felt LYMPH:  no palpable lymphadenopathy in the cervical, axillary or inguinal regions LUNGS: clear to auscultation and  No wheeze or crackles HEART/CVS: regular rate & rhythm and no murmurs; No lower extremity edema ABDOMEN: abdomen soft, non-tender and normal bowel sounds Musculoskeletal:no cyanosis of digits and no clubbing.  PSYCH: alert & oriented x 3  NEURO: no focal motor/sensory deficits SKIN:  no rashes or significant lesions     CMP Latest Ref Rng & Units 02/02/2018  Glucose 65 - 99 mg/dL 151(H)  BUN 6 - 20 mg/dL 16  Creatinine 0.61 - 1.24 mg/dL 0.70  Sodium 135 - 145 mmol/L 137  Potassium 3.5 - 5.1 mmol/L 3.5  Chloride 101 - 111 mmol/L 103  CO2 22 - 32 mmol/L 24  Calcium 8.9 - 10.3 mg/dL 8.5(L)  Total Protein 6.5 - 8.1 g/dL 6.7  Total Bilirubin 0.3 - 1.2 mg/dL 0.4  Alkaline Phos 38 - 126 U/L 100  AST 15 - 41 U/L 20  ALT 17 - 63 U/L 18   CBC Latest Ref Rng & Units 02/02/2018  WBC 3.8 - 10.6 K/uL 6.5  Hemoglobin 13.0 - 18.0 g/dL 12.5(L)  Hematocrit 40.0 - 52.0 % 37.4(L)  Platelets 150 - 440 K/uL 259     Dg Hip Unilat With Pelvis 2-3 Views Right  Result Date: 02/02/2018 CLINICAL DATA:  Right hip/leg pain. History of osseous metastatic disease. EXAM: DG HIP (WITH OR WITHOUT PELVIS) 2-3V RIGHT COMPARISON:  Right hip MRI 12/15/2017 FINDINGS: Many of the bone lesions on prior MRI are not well seen radiographically. There is a 6 cm expansile lytic lesion of the right anterior iliac spine. No evidence of acute or pathologic fracture. Femoral head remains seated. IMPRESSION: 1. Expansile lytic lesion of the right anterior iliac spine. 2. Many of the smaller bone lesions on prior MRI are not well seen radiographically. 3. No evidence of acute or pathologic fracture. Electronically Signed   By: Jeb Levering M.D.    On: 02/02/2018 06:23    Assessment and plan- Patient is a 57 y.o. male with history of metastatic renal cell carcinoma currently on cabozantinib, bone lesions, history of substance abuse, presented to emergency room for intractable pain currently admitted for pain control.  #Right thigh pain secondary to right femur bone lesion, status post recent radiation.  I will obtain x-ray of the right thigh to exclude any pathological fracture.  #Intractable pain: Recommend involving palliative medicine for pain management.  Given his history of substance abuse, I recommend him to establish care with pain clinic for future pain management.  #Metastatic renal cell carcinoma: Continue cabozantinib.  Clinically his respiratory symptoms has improved. Above discussed with Dr. Benjie Karvonen.   Thank you for this kind referral and the opportunity to participate in the care of this patient.   Earlie Server, MD, PhD Hematology Oncology [facility] Pager- [phone] 02/02/2018

## 2018-02-03 MED ORDER — MORPHINE SULFATE ER 30 MG PO TBCR: 30.0000 mg | EXTENDED_RELEASE_TABLET | Freq: Two times a day (BID) | ORAL | 0 refills | Status: AC

## 2018-02-03 MED ORDER — AMLODIPINE BESYLATE 5 MG PO TABS: 5.0000 mg | ORAL_TABLET | Freq: Every day | ORAL | 0 refills | Status: AC

## 2018-02-03 NOTE — Discharge Summary (Signed)
[facility] NAME: Larry Watkins    MR#:  [id number]  DATE OF BIRTH:  1961-03-27  DATE OF ADMISSION:  02/02/2018 ADMITTING PHYSICIAN: Harrie Foreman, MD  DATE OF DISCHARGE: 02/03/2018  PRIMARY CARE PHYSICIAN: Earlie Server, MD    ADMISSION DIAGNOSIS:  Bone pain [M89.8X9] Cancer associated pain [G89.3] Pain of right hip joint [M25.551]  DISCHARGE DIAGNOSIS:  Active Problems:   Intractable pain   Bone pain   Right thigh pain   SECONDARY DIAGNOSIS:   Past Medical History:  Diagnosis Date  . Bone metastasis ([place]) 07/17/2017  . Bone metastasis ([place]) 07/17/2017  . Metastatic renal cell carcinoma [facility])     HOSPITAL COURSE:    57 y.o. male with history of metastatic renal cell carcinoma currently on cabozantinib, bone lesions, history of substance abuse, presented to emergency room for intractable pain currently admitted for pain control.   1. Right thigh pain secondary to right femur bone lesion, status post recent radiation.  Right feumr XRAy was negative for pathological fracture. He will needs outpatient pain management.  Given his history of substance abuse, I recommend him to establish care with pain clinic for future pain management.  2. Metastatic renal cell carcinoma: Continue cabozantinib.    3.HTN, essential : Continue Norvasc. I increased dose to 5 mg daily for better BP control.  4. COPD without exacerbation   5. Tobacco dependence: Patient is encouraged to quit smoking. Counseling was provided for 4 minutes.   DISCHARGE CONDITIONS AND DIET:   Stable Regular diet  CONSULTS OBTAINED:  Treatment Team:  Earlie Server, MD  DRUG ALLERGIES:  No Known Allergies  DISCHARGE MEDICATIONS:   Allergies as of 02/03/2018   No Known Allergies     Medication List    TAKE these medications   albuterol 108 (90 Base) MCG/ACT inhaler Commonly known as:  PROVENTIL HFA;VENTOLIN HFA Inhale 2 puffs into the lungs every 6  (six) hours as needed for wheezing or shortness of breath.   amLODipine 5 MG tablet Commonly known as:  NORVASC Take 1 tablet (5 mg total) by mouth daily. What changed:    medication strength  how much to take   cabozantinib S-Malate 60 MG Tabs Commonly known as:  CABOMETYX Take 60 mg by mouth daily.   gabapentin 300 MG capsule Commonly known as:  NEURONTIN Take 1 capsule (300 mg total) by mouth 3 (three) times daily.   morphine 30 MG 12 hr tablet Commonly known as:  MS CONTIN Take 1 tablet (30 mg total) by mouth every 12 (twelve) hours.   morphine 15 MG 12 hr tablet Commonly known as:  MS CONTIN Take 1 tablet (15 mg total) by mouth every 12 (twelve) hours.   omeprazole 20 MG capsule Commonly known as:  PRILOSEC Take 1 capsule (20 mg total) by mouth daily.   ondansetron 4 MG disintegrating tablet Commonly known as:  ZOFRAN ODT Take 1 tablet (4 mg total) by mouth every 8 (eight) hours as needed for nausea or vomiting.   oxyCODONE-acetaminophen 10-325 MG tablet Commonly known as:  PERCOCET Take 1 tablet by mouth every 6 (six) hours as needed for pain.   predniSONE 10 MG tablet Commonly known as:  DELTASONE Take 1 tablet (10 mg total) by mouth daily with breakfast.   tiotropium 18 MCG inhalation capsule Commonly known as:  SPIRIVA HANDIHALER Place 1 capsule (18 mcg total) into inhaler and inhale daily.  Today   CHIEF COMPLAINT:  Has pain but better   VITAL SIGNS:  Blood pressure (!) 162/87, pulse 98, temperature 99.4 F (37.4 C), temperature source Oral, resp. rate 15, height 5\' 7"  (1.702 m), weight 103.8 kg (228 lb 12.8 oz), SpO2 98 %.   REVIEW OF SYSTEMS:  Review of Systems  Constitutional: Negative.  Negative for chills, fever and malaise/fatigue.  HENT: Negative.  Negative for ear discharge, ear pain, hearing loss, nosebleeds and sore throat.   Eyes: Negative.  Negative for blurred vision and pain.  Respiratory: Negative.  Negative for  cough, hemoptysis, shortness of breath and wheezing.   Cardiovascular: Negative.  Negative for chest pain, palpitations and leg swelling.  Gastrointestinal: Negative.  Negative for abdominal pain, blood in stool, diarrhea, nausea and vomiting.  Genitourinary: Negative.  Negative for dysuria.  Musculoskeletal: Positive for joint pain. Negative for back pain.  Skin: Negative.   Neurological: Negative for dizziness, tremors, speech change, focal weakness, seizures and headaches.  Endo/Heme/Allergies: Negative.  Does not bruise/bleed easily.  Psychiatric/Behavioral: Negative.  Negative for depression, hallucinations and suicidal ideas.     PHYSICAL EXAMINATION:  GENERAL:  57 y.o.-year-old patient lying in the bed with no acute distress.  NECK:  Supple, no jugular venous distention. No thyroid enlargement, no tenderness.  LUNGS: Normal breath sounds bilaterally, no wheezing, rales,rhonchi  No use of accessory muscles of respiration.  CARDIOVASCULAR: S1, S2 normal. No murmurs, rubs, or gallops.  ABDOMEN: Soft, non-tender, non-distended. Bowel sounds present. No organomegaly or mass.  EXTREMITIES: No pedal edema, cyanosis, or clubbing.  PSYCHIATRIC: The patient is alert and oriented x 3.  SKIN: No obvious rash, lesion, or ulcer.   DATA REVIEW:   CBC Recent Labs  Lab 02/02/18 0539  WBC 6.5  HGB 12.5*  HCT 37.4*  PLT 259    Chemistries  Recent Labs  Lab 02/02/18 0539  NA 137  K 3.5  CL 103  CO2 24  GLUCOSE 151*  BUN 16  CREATININE 0.70  CALCIUM 8.5*  AST 20  ALT 18  ALKPHOS 100  BILITOT 0.4    Cardiac Enzymes No results for input(s): TROPONINI in the last 168 hours.  Microbiology Results  @MICRORSLT48 @  RADIOLOGY:  Dg Hip Unilat With Pelvis 2-3 Views Right  Result Date: 02/02/2018 CLINICAL DATA:  Right hip/leg pain. History of osseous metastatic disease. EXAM: DG HIP (WITH OR WITHOUT PELVIS) 2-3V RIGHT COMPARISON:  Right hip MRI 12/15/2017 FINDINGS: Many of the bone  lesions on prior MRI are not well seen radiographically. There is a 6 cm expansile lytic lesion of the right anterior iliac spine. No evidence of acute or pathologic fracture. Femoral head remains seated. IMPRESSION: 1. Expansile lytic lesion of the right anterior iliac spine. 2. Many of the smaller bone lesions on prior MRI are not well seen radiographically. 3. No evidence of acute or pathologic fracture. Electronically Signed   By: Jeb Levering M.D.   On: 02/02/2018 06:23   Dg Femur, Min 2 Views Right  Result Date: 02/02/2018 CLINICAL DATA:  Right thigh pain with progression since last radiation treatment 1 month ago. Bone metastasis. Metastatic renal cell cancer. EXAM: RIGHT FEMUR 2 VIEWS COMPARISON:  MRI of the right hip 12/15/2017. CT of the pelvis 12/26/2017. Right hip radiographs 02/02/2018. FINDINGS: The lytic lesion along the anterior iliac spine is again noted. Lesions within the proximal femur are not well appreciated by radiographs. No acute or healing fractures are present. Mild degenerative changes are noted at the knee  and hip. IMPRESSION: 1. No acute or healing fracture. 2. Mild degenerative changes of the knee and hip. 3. Known metastases in the proximal right femur are not well seen by x-ray. 4. Lytic lesion at the anterior iliac spine. Electronically Signed   By: San Morelle M.D.   On: 02/02/2018 17:30      Allergies as of 02/03/2018   No Known Allergies     Medication List    TAKE these medications   albuterol 108 (90 Base) MCG/ACT inhaler Commonly known as:  PROVENTIL HFA;VENTOLIN HFA Inhale 2 puffs into the lungs every 6 (six) hours as needed for wheezing or shortness of breath.   amLODipine 5 MG tablet Commonly known as:  NORVASC Take 1 tablet (5 mg total) by mouth daily. What changed:    medication strength  how much to take   cabozantinib S-Malate 60 MG Tabs Commonly known as:  CABOMETYX Take 60 mg by mouth daily.   gabapentin 300 MG  capsule Commonly known as:  NEURONTIN Take 1 capsule (300 mg total) by mouth 3 (three) times daily.   morphine 30 MG 12 hr tablet Commonly known as:  MS CONTIN Take 1 tablet (30 mg total) by mouth every 12 (twelve) hours.   morphine 15 MG 12 hr tablet Commonly known as:  MS CONTIN Take 1 tablet (15 mg total) by mouth every 12 (twelve) hours.   omeprazole 20 MG capsule Commonly known as:  PRILOSEC Take 1 capsule (20 mg total) by mouth daily.   ondansetron 4 MG disintegrating tablet Commonly known as:  ZOFRAN ODT Take 1 tablet (4 mg total) by mouth every 8 (eight) hours as needed for nausea or vomiting.   oxyCODONE-acetaminophen 10-325 MG tablet Commonly known as:  PERCOCET Take 1 tablet by mouth every 6 (six) hours as needed for pain.   predniSONE 10 MG tablet Commonly known as:  DELTASONE Take 1 tablet (10 mg total) by mouth daily with breakfast.   tiotropium 18 MCG inhalation capsule Commonly known as:  SPIRIVA HANDIHALER Place 1 capsule (18 mcg total) into inhaler and inhale daily.        }   Management plans discussed with the patient and he is in agreement. Stable for discharge home  Patient should follow up with pcp  CODE STATUS:     Code Status Orders  (From admission, onward)        Start     Ordered   02/02/18 0741  Full code  Continuous     02/02/18 0740    Code Status History    Date Active Date Inactive Code Status Order ID Comments User Context   12/05/2017 13:39 12/07/2017 12:59 Full Code [id number]  Gladstone Lighter, MD ED      TOTAL TIME TAKING CARE OF THIS PATIENT: 38 minutes.    Note: This dictation was prepared with Dragon dictation along with smaller phrase technology. Any transcriptional errors that result from this process are unintentional.  Raynisha Avilla M.D on 02/03/2018 at 9:45 AM  Between 7am to 6pm - Pager - [phone] After 6pm go to [email or web address] - password [email or web address] [place] [facility]  Office   [phone]  CC: Primary care physician; Earlie Server, MD

## 2018-02-03 NOTE — Plan of Care (Signed)
Pt w/renal cell carcinoma with mets to pelvis was admitted for pain control b/c he doesn't have any medication at home - is d/ced home.   Oncologist had refused to prescribe him any b/c he tested positive for cocaine 3 wks, which he admitted to me.  He also admitted that he takes more than prescribed b/c his pain is not controlled.  [facility] trying to get him a script for enough pain medicine until his appt on Friday w/Dr. Tasia Catchings.  [facility] stated hospitalist would have to do that.  Called Dr. Benjie Karvonen and got a script for 3 pills.  Encouraged pt to take anti-inflammatory but stressed that he take on a full stomach.  Also stated that prednisone and gabapentin will help with the pain.  Reviewed d/c instructions and f/u appts.  [facility] nursing student had removed IV earlier.  Pt left with wife for home.

## 2018-02-03 NOTE — Care Management (Signed)
Discharge to home today per Dr. Benjie Karvonen. Requested information about [facility] pain management clinic. Queried for the internet and given to friend at the bedside. Friend will transport Larry Ammons RN MSN [place] [facility] [phone]

## 2018-02-03 NOTE — Progress Notes (Signed)
No charge note.  Chart reviewed. Patient seen, history taken.  He is being discharged today.    Will discontinue palliative consult.  Florentina Jenny, PA-C Palliative Medicine Pager: [phone]

## 2018-02-04 ENCOUNTER — Telehealth: Payer: Self-pay | Admitting: *Deleted

## 2018-02-04 ENCOUNTER — Ambulatory Visit
Admit: 2018-02-04 | Discharge: 2018-02-04 | Disposition: A | Discharge status: Home / Self Care | Payer: MEDICAID | Attending: Emergency Medicine

## 2018-02-04 NOTE — Telephone Encounter (Signed)
Per Almyra Free 02/04/18 staff message to Please call and cancel appt on friday... reschedule out for 1 week. Patient is aware of new date and time.

## 2018-02-05 MED FILL — CABOMETYX 60 MG TABLET: 60 | 30 days supply | Qty: 30 | Fill #1

## 2018-02-06 ENCOUNTER — Inpatient Hospital Stay: Payer: Medicaid Other | Admitting: Oncology

## 2018-02-06 ENCOUNTER — Inpatient Hospital Stay: Payer: Medicaid Other

## 2018-02-09 ENCOUNTER — Encounter: Payer: Self-pay | Admitting: Emergency Medicine

## 2018-02-09 ENCOUNTER — Emergency Department
Admission: EM | Admit: 2018-02-09 | Discharge: 2018-02-10 | Disposition: A | Discharge status: Home / Self Care | Payer: Medicaid Other | Attending: Emergency Medicine | Admitting: Emergency Medicine

## 2018-02-09 ENCOUNTER — Other Ambulatory Visit: Payer: Self-pay

## 2018-02-09 ENCOUNTER — Telehealth: Payer: Self-pay | Admitting: *Deleted

## 2018-02-09 DIAGNOSIS — G893 Neoplasm related pain (acute) (chronic): Secondary | ICD-10-CM | POA: Diagnosis not present

## 2018-02-09 DIAGNOSIS — C641 Malignant neoplasm of right kidney, except renal pelvis: Secondary | ICD-10-CM | POA: Insufficient documentation

## 2018-02-09 DIAGNOSIS — F1490 Cocaine use, unspecified, uncomplicated: Secondary | ICD-10-CM | POA: Diagnosis not present

## 2018-02-09 DIAGNOSIS — C7951 Secondary malignant neoplasm of bone: Secondary | ICD-10-CM | POA: Diagnosis not present

## 2018-02-09 DIAGNOSIS — F17210 Nicotine dependence, cigarettes, uncomplicated: Secondary | ICD-10-CM | POA: Diagnosis not present

## 2018-02-09 DIAGNOSIS — C649 Malignant neoplasm of unspecified kidney, except renal pelvis: Secondary | ICD-10-CM

## 2018-02-09 DIAGNOSIS — M25551 Pain in right hip: Secondary | ICD-10-CM | POA: Diagnosis present

## 2018-02-09 LAB — COMPREHENSIVE METABOLIC PANEL
ALBUMIN: 3.3 g/dL — AB (ref 3.5–5.0)
ALK PHOS: 102 U/L (ref 38–126)
ALT: 29 U/L (ref 17–63)
ANION GAP: 8 (ref 5–15)
AST: 19 U/L (ref 15–41)
BILIRUBIN TOTAL: 0.4 mg/dL (ref 0.3–1.2)
BUN: 19 mg/dL (ref 6–20)
CALCIUM: 9 mg/dL (ref 8.9–10.3)
CO2: 29 mmol/L (ref 22–32)
Chloride: 101 mmol/L (ref 101–111)
Creatinine, Ser: 0.98 mg/dL (ref 0.61–1.24)
GFR calc non Af Amer: >60 mL/min (ref >60)
GLUCOSE: 104 mg/dL — AB (ref 65–99)
POTASSIUM: 4 mmol/L (ref 3.5–5.1)
SODIUM: 138 mmol/L (ref 135–145)
TOTAL PROTEIN: 7.8 g/dL (ref 6.5–8.1)

## 2018-02-09 LAB — CBC WITH DIFFERENTIAL/PLATELET
BASOS PCT: 1 %
Basophils Absolute: 0.1 10*3/uL (ref 0–0.1)
EOS ABS: 0 10*3/uL (ref 0–0.7)
Eosinophils Relative: 1 %
HEMATOCRIT: 37.6 % — AB (ref 40.0–52.0)
HEMOGLOBIN: 12.6 g/dL — AB (ref 13.0–18.0)
LYMPHS ABS: 1.1 10*3/uL (ref 1.0–3.6)
Lymphocytes Relative: 16 %
MCH: 27.8 pg (ref 26.0–34.0)
MCHC: 33.5 g/dL (ref 32.0–36.0)
MCV: 83.2 fL (ref 80.0–100.0)
MONO ABS: 0.4 10*3/uL (ref 0.2–1.0)
MONOS PCT: 5 %
Neutro Abs: 5.3 10*3/uL (ref 1.4–6.5)
Neutrophils Relative %: 77 %
Platelets: 380 10*3/uL (ref 150–440)
RBC: 4.51 MIL/uL (ref 4.40–5.90)
RDW: 17.2 % — AB (ref 11.5–14.5)
WBC: 6.8 10*3/uL (ref 3.8–10.6)

## 2018-02-09 NOTE — ED Notes (Signed)
Triage assessment and notes entered by this RN; did not realize was logged in under [place], EDT at the time.

## 2018-02-09 NOTE — Telephone Encounter (Signed)
Recieved vm from patients friend, Marlowe Kays, requesting refills of Gabapentin and Prednisone.  Advised friend that gabapentin has refills, please contact pharmacy.  Per Dr Tasia Catchings as per OV note from 3/1, prednisone has been stopped and will not be refilled.  Notifed pt of this.

## 2018-02-09 NOTE — ED Triage Notes (Signed)
Pt arrives to ED via POV from home with c/o RIGHT leg pain. Pt reports have Stage 4 bone cancer, reports seen here last week for same. Pt reports use of OTC medications without relief; per pt, no r/x'd medications taken PTA. Pt reports during last visit was given Ketamine, but states he "never wants that again". Pt states he was admitted to hospital last time he was seen here for pain control.

## 2018-02-10 MED ORDER — OXYCODONE HCL 5 MG PO TABS
10.0000 mg | ORAL_TABLET | Freq: Once | ORAL | Status: AC
  Administered 2018-02-10: 10 mg via ORAL
  Filled 2018-02-10: qty 2

## 2018-02-10 MED ORDER — OXYCODONE HCL 5 MG PO TABS: 5.0000 mg | ORAL_TABLET | ORAL | 0 refills | Status: AC | PRN

## 2018-02-10 MED ORDER — HYDROMORPHONE HCL 1 MG/ML IJ SOLN
1.0000 mg | Freq: Once | INTRAMUSCULAR | Status: AC
  Administered 2018-02-10: 1 mg via INTRAVENOUS

## 2018-02-10 MED ORDER — HYDROMORPHONE HCL 1 MG/ML IJ SOLN
INTRAMUSCULAR | Status: AC
  Administered 2018-02-10: 1 mg via INTRAVENOUS
  Filled 2018-02-10: qty 1

## 2018-02-10 NOTE — ED Notes (Signed)
Urged patient to void. 

## 2018-02-12 NOTE — ED Provider Notes (Signed)
[facility] Emergency Department Provider Note    First MD Initiated Contact with Patient 02/10/18 0000     (approximate)  I have reviewed the triage vital signs and the nursing notes.   HISTORY  Chief Complaint Leg Pain    HPI Larry Watkins is a 57 y.o. male with history of metastatic renal cell carcinoma presents to the emergency department with 10 out of 10 bilateral hip pain times approximately 1 week.  Patient states that his oncologist refuses to write him for any narcotic prescriptions and as such she is currently seeking to be transferred to [facility] for further management.  Patient states that he knows that he has metastatic disease to his hips and believe that this is the source of his discomfort.  She denies any lower extremity weakness numbness.  Patient denies any bowel or bladder habit changes or incontinence.  Patient does admit to cocaine use yesterday   Past Medical History:  Diagnosis Date  . Bone metastasis ([place]) 07/17/2017  . Bone metastasis ([place]) 07/17/2017  . Metastatic renal cell carcinoma [facility])     Patient Active Problem List   Diagnosis Date Noted  . Intractable pain 02/02/2018  . Bone pain   . Right thigh pain   . Cocaine abuse ([place]) 01/29/2018  . Cancer associated pain 01/12/2018  . Pneumonia 12/05/2017  . Goals of care, counseling/discussion 08/01/2017  . Metastatic renal cell carcinoma ([place]) 07/17/2017  . Bone metastasis ([place]) 07/17/2017  . Collapse of right lung 07/15/2017  . Renal cell carcinoma of right kidney ([place]) 07/14/2017    Past Surgical History:  Procedure Laterality Date  . FLEXIBLE BRONCHOSCOPY N/A 07/24/2017   Procedure: FLEXIBLE BRONCHOSCOPY;  Surgeon: [name], MD;  Location: [facility] ORS;  Service: Pulmonary;  Laterality: N/A;  . HERNIA REPAIR    . left middle amputation      with reattachment    Prior to Admission medications   Medication Sig Start Date End Date Taking? Authorizing Provider    albuterol (PROVENTIL HFA;VENTOLIN HFA) 108 (90 Base) MCG/ACT inhaler Inhale 2 puffs into the lungs every 6 (six) hours as needed for wheezing or shortness of breath. 12/25/17   Earlie Server, MD  amLODipine (NORVASC) 5 MG tablet Take 1 tablet (5 mg total) by mouth daily. 02/03/18   Bettey Costa, MD  cabozantinib S-Malate (CABOMETYX) 60 MG TABS Take 60 mg by mouth daily. 01/05/18   Earlie Server, MD  gabapentin (NEURONTIN) 300 MG capsule Take 1 capsule (300 mg total) by mouth 3 (three) times daily. 01/21/18   Earlie Server, MD  morphine (MS CONTIN) 15 MG 12 hr tablet Take 1 tablet (15 mg total) by mouth every 12 (twelve) hours. 01/19/18   Earlie Server, MD  morphine (MS CONTIN) 30 MG 12 hr tablet Take 1 tablet (30 mg total) by mouth every 12 (twelve) hours. 02/03/18   Henreitta Leber, MD  omeprazole (PRILOSEC) 20 MG capsule Take 1 capsule (20 mg total) by mouth daily. 01/01/18   Earlie Server, MD  ondansetron (ZOFRAN ODT) 4 MG disintegrating tablet Take 1 tablet (4 mg total) by mouth every 8 (eight) hours as needed for nausea or vomiting. 12/17/17   Earlie Server, MD  oxyCODONE (ROXICODONE) 5 MG immediate release tablet Take 1 tablet (5 mg total) by mouth every 4 (four) hours as needed for severe pain. 02/10/18   Gregor Hams, MD  oxyCODONE-acetaminophen (PERCOCET) 10-325 MG tablet Take 1 tablet by mouth every 6 (six) hours as  needed for pain. 01/06/18   Earlie Server, MD  predniSONE (DELTASONE) 10 MG tablet Take 1 tablet (10 mg total) by mouth daily with breakfast. 01/22/18   Earlie Server, MD  tiotropium (SPIRIVA HANDIHALER) 18 MCG inhalation capsule Place 1 capsule (18 mcg total) into inhaler and inhale daily. 01/12/18   Verlon Au, NP    Allergies No known drug allergies  Family History  Problem Relation Age of Onset  . Cancer Paternal Uncle        brain    Social History Social History   Tobacco Use  . Smoking status: Current Every Day Smoker    Packs/day: 0.25    Years: 38.00    Pack years: 9.50  . Smokeless tobacco: Never  Used  Substance Use Topics  . Alcohol use: Yes    Comment: 1-2 beers, mixed drinks on weekends  . Drug use: Yes    Types: "Crack" cocaine    Comment: Patient tested positive for cocaine on 01/12/18    Review of Systems Constitutional: No fever/chills Eyes: No visual changes. ENT: No sore throat. Cardiovascular: Denies chest pain. Respiratory: Denies shortness of breath. Gastrointestinal: No abdominal pain.  No nausea, no vomiting.  No diarrhea.  No constipation. Genitourinary: Negative for dysuria. Musculoskeletal: Negative for neck pain.  Negative for back pain. Integumentary: Negative for rash. Neurological: Negative for headaches, focal weakness or numbness.   ____________________________________________   PHYSICAL EXAM:  VITAL SIGNS: ED Triage Vitals [02/09/18 2039]  Enc Vitals Group     BP (!) 160/105     Pulse Rate 89     Resp 18     Temp 98.3 F (36.8 C)     Temp Source Oral     SpO2 99 %     Weight 101.2 kg (223 lb)     Height 1.702 m (_0 )     Head Circumference      Peak Flow      Pain Score 10     Pain Loc      Pain Edu?      Excl. in [place]?     Constitutional: Alert and oriented.  Apparent discomfort eyes: Conjunctivae are normal.  Head: Atraumatic. Mouth/Throat: Mucous membranes are moist. Oropharynx non-erythematous. Neck: No stridor.   Cardiovascular: Normal rate, regular rhythm. Good peripheral circulation. Grossly normal heart sounds. Respiratory: Normal respiratory effort.  No retractions. Lungs CTAB. Gastrointestinal: Soft and nontender. No distention.  Musculoskeletal: No lower extremity tenderness nor edema. No gross deformities of extremities. Neurologic:  Normal speech and language. No gross focal neurologic deficits are appreciated.  Skin:  Skin is warm, dry and intact. No rash noted. Psychiatric: Mood and affect are normal. Speech and behavior are normal.  ____________________________________________   LABS (all labs ordered are  listed, but only abnormal results are displayed)  Labs Reviewed  CBC WITH DIFFERENTIAL/PLATELET - Abnormal; Notable for the following components:      Result Value   Hemoglobin 12.6 (*)    HCT 37.6 (*)    RDW 17.2 (*)    All other components within normal limits  COMPREHENSIVE METABOLIC PANEL - Abnormal; Notable for the following components:   Glucose, Bld 104 (*)    Albumin 3.3 (*)    All other components within normal limits   ________    Procedures   ____________________________________________   INITIAL IMPRESSION / ASSESSMENT AND PLAN / ED COURSE  As part of my medical decision making, I reviewed the following data within the electronic medical  record:    57 year old male with known metastatic renal cell carcinoma including bone metastases presents with bilateral hip pain.  Patient admits to cocaine use yesterday and admits that he has no medication for pain at home.  Review of the patient's chart revealed that the patient ran out of opiate narcotic prescription that was a month supply in the span of 1 week.  Patient received IV Dilaudid in the emergency department for pain control and was given oxycodone as well.  I spoke with the patient at length regarding the necessity of follow-up with oncology for further outpatient pain management.     ____________________________________________  FINAL CLINICAL IMPRESSION(S) / ED DIAGNOSES  Final diagnoses:  Renal cell carcinoma, unspecified laterality (HCC)  Metastatic renal cell carcinoma   MEDICATIONS GIVEN DURING THIS VISIT:  Medications  HYDROmorphone (DILAUDID) injection 1 mg (1 mg Intravenous Given 02/10/18 0022)  oxyCODONE (Oxy IR/ROXICODONE) immediate release tablet 10 mg (10 mg Oral Given 02/10/18 0136)     ED Discharge Orders        Ordered    oxyCODONE (ROXICODONE) 5 MG immediate release tablet  Every 4 hours PRN     02/10/18 0116       Note:  This document was prepared using Dragon voice recognition  software and may include unintentional dictation errors.    Gregor Hams, MD 02/12/18 [id number]

## 2018-02-13 ENCOUNTER — Encounter: Payer: Self-pay | Admitting: Oncology

## 2018-02-13 ENCOUNTER — Encounter: Payer: Self-pay | Admitting: Emergency Medicine

## 2018-02-13 ENCOUNTER — Emergency Department
Admission: EM | Admit: 2018-02-13 | Discharge: 2018-02-13 | Disposition: A | Discharge status: Home / Self Care | Payer: Medicaid Other | Attending: Emergency Medicine | Admitting: Emergency Medicine

## 2018-02-13 ENCOUNTER — Inpatient Hospital Stay (HOSPITAL_BASED_OUTPATIENT_CLINIC_OR_DEPARTMENT_OTHER): Payer: Medicaid Other | Admitting: Oncology

## 2018-02-13 ENCOUNTER — Inpatient Hospital Stay: Payer: Medicaid Other

## 2018-02-13 ENCOUNTER — Other Ambulatory Visit: Payer: Self-pay

## 2018-02-13 VITALS — BP 140/96 | HR 104 | Temp 98.5°F | Resp 22 | Wt 223.0 lb

## 2018-02-13 DIAGNOSIS — M79605 Pain in left leg: Secondary | ICD-10-CM | POA: Insufficient documentation

## 2018-02-13 DIAGNOSIS — G893 Neoplasm related pain (acute) (chronic): Secondary | ICD-10-CM

## 2018-02-13 DIAGNOSIS — C7951 Secondary malignant neoplasm of bone: Secondary | ICD-10-CM

## 2018-02-13 DIAGNOSIS — Z79899 Other long term (current) drug therapy: Secondary | ICD-10-CM | POA: Diagnosis not present

## 2018-02-13 DIAGNOSIS — C649 Malignant neoplasm of unspecified kidney, except renal pelvis: Secondary | ICD-10-CM

## 2018-02-13 DIAGNOSIS — F17200 Nicotine dependence, unspecified, uncomplicated: Secondary | ICD-10-CM | POA: Insufficient documentation

## 2018-02-13 DIAGNOSIS — M898X9 Other specified disorders of bone, unspecified site: Secondary | ICD-10-CM

## 2018-02-13 DIAGNOSIS — Z720 Tobacco use: Secondary | ICD-10-CM

## 2018-02-13 DIAGNOSIS — M79604 Pain in right leg: Secondary | ICD-10-CM | POA: Insufficient documentation

## 2018-02-13 DIAGNOSIS — F1910 Other psychoactive substance abuse, uncomplicated: Secondary | ICD-10-CM | POA: Diagnosis not present

## 2018-02-13 DIAGNOSIS — Z85528 Personal history of other malignant neoplasm of kidney: Secondary | ICD-10-CM | POA: Diagnosis not present

## 2018-02-13 DIAGNOSIS — C641 Malignant neoplasm of right kidney, except renal pelvis: Secondary | ICD-10-CM

## 2018-02-13 LAB — CBC WITH DIFFERENTIAL/PLATELET
BASOS PCT: 1 %
Basophils Absolute: 0 10*3/uL (ref 0–0.1)
EOS ABS: 0.1 10*3/uL (ref 0–0.7)
Eosinophils Relative: 1 %
HCT: 36.4 % — ABNORMAL LOW (ref 40.0–52.0)
Hemoglobin: 12.5 g/dL — ABNORMAL LOW (ref 13.0–18.0)
LYMPHS ABS: 0.9 10*3/uL — AB (ref 1.0–3.6)
Lymphocytes Relative: 16 %
MCH: 28.4 pg (ref 26.0–34.0)
MCHC: 34.3 g/dL (ref 32.0–36.0)
MCV: 82.8 fL (ref 80.0–100.0)
MONO ABS: 0.3 10*3/uL (ref 0.2–1.0)
MONOS PCT: 6 %
Neutro Abs: 4.1 10*3/uL (ref 1.4–6.5)
Neutrophils Relative %: 76 %
PLATELETS: 337 10*3/uL (ref 150–440)
RBC: 4.4 MIL/uL (ref 4.40–5.90)
RDW: 16.8 % — ABNORMAL HIGH (ref 11.5–14.5)
WBC: 5.3 10*3/uL (ref 3.8–10.6)

## 2018-02-13 LAB — COMPREHENSIVE METABOLIC PANEL
ALBUMIN: 3 g/dL — AB (ref 3.5–5.0)
ALK PHOS: 115 U/L (ref 38–126)
ALT: 25 U/L (ref 17–63)
ANION GAP: 10 (ref 5–15)
AST: 23 U/L (ref 15–41)
BUN: 17 mg/dL (ref 6–20)
CALCIUM: 8.7 mg/dL — AB (ref 8.9–10.3)
CO2: 26 mmol/L (ref 22–32)
Chloride: 99 mmol/L — ABNORMAL LOW (ref 101–111)
Creatinine, Ser: 0.71 mg/dL (ref 0.61–1.24)
GFR calc non Af Amer: >60 mL/min (ref >60)
Glucose, Bld: 157 mg/dL — ABNORMAL HIGH (ref 65–99)
POTASSIUM: 4.1 mmol/L (ref 3.5–5.1)
SODIUM: 135 mmol/L (ref 135–145)
Total Bilirubin: 0.3 mg/dL (ref 0.3–1.2)
Total Protein: 7.3 g/dL (ref 6.5–8.1)

## 2018-02-13 LAB — CBC
HEMATOCRIT: 39 % — AB (ref 40.0–52.0)
Hemoglobin: 13.1 g/dL (ref 13.0–18.0)
MCH: 27.7 pg (ref 26.0–34.0)
MCHC: 33.5 g/dL (ref 32.0–36.0)
MCV: 82.6 fL (ref 80.0–100.0)
Platelets: 327 10*3/uL (ref 150–440)
RBC: 4.72 MIL/uL (ref 4.40–5.90)
RDW: 17.3 % — ABNORMAL HIGH (ref 11.5–14.5)
WBC: 6.1 10*3/uL (ref 3.8–10.6)

## 2018-02-13 LAB — BASIC METABOLIC PANEL
Anion gap: 11 (ref 5–15)
BUN: 17 mg/dL (ref 6–20)
CO2: 26 mmol/L (ref 22–32)
Calcium: 8.8 mg/dL — ABNORMAL LOW (ref 8.9–10.3)
Chloride: 98 mmol/L — ABNORMAL LOW (ref 101–111)
Creatinine, Ser: 0.74 mg/dL (ref 0.61–1.24)
GFR calc non Af Amer: >60 mL/min (ref >60)
Glucose, Bld: 103 mg/dL — ABNORMAL HIGH (ref 65–99)
POTASSIUM: 4.2 mmol/L (ref 3.5–5.1)
SODIUM: 135 mmol/L (ref 135–145)

## 2018-02-13 MED ORDER — FENTANYL CITRATE (PF) 100 MCG/2ML IJ SOLN
50.0000 ug | INTRAMUSCULAR | Status: DC | PRN
  Administered 2018-02-13: 50 ug via INTRAVENOUS

## 2018-02-13 MED ORDER — HYDROMORPHONE HCL 1 MG/ML IJ SOLN
1.0000 mg | Freq: Once | INTRAMUSCULAR | Status: AC
  Administered 2018-02-13: 1 mg via INTRAVENOUS
  Filled 2018-02-13: qty 1

## 2018-02-13 MED ORDER — FENTANYL CITRATE (PF) 100 MCG/2ML IJ SOLN
INTRAMUSCULAR | Status: AC
  Filled 2018-02-13: qty 2

## 2018-02-13 MED ORDER — OXYCODONE-ACETAMINOPHEN 10-325 MG PO TABS: 1.0000 | ORAL_TABLET | Freq: Four times a day (QID) | ORAL | 0 refills | Status: AC | PRN

## 2018-02-13 NOTE — Progress Notes (Signed)
Pt here for follow up. Writing in pain moaning sitting in w/c. Stated that he never went to /or made appt w pain clinic ( as directed ) . Goes to ER -recently 2 x in past 2 weeks he stated. Relays that " they give me Dilaudid and a script for oxycodone that helps-till I come here.. " I  need more pain medicine " pt states

## 2018-02-13 NOTE — ED Notes (Signed)
Pt given crackers and water. 

## 2018-02-13 NOTE — ED Triage Notes (Signed)
Pt tearful in triage, states he has cancer with mets, now in his bone, bilateral leg pain at this time.

## 2018-02-13 NOTE — ED Provider Notes (Addendum)
[facility] Emergency Department Provider Note  ____________________________________________   I have reviewed the triage vital signs and the nursing notes. Where available I have reviewed prior notes and, if possible and indicated, outside hospital notes.    HISTORY  Chief Complaint Leg Pain    HPI Larry Watkins is a 57 y.o. male presents today complaining of patient with metastatic renal cell carcinoma and a history of significant pain.  He is getting significant pain medication at home but he is having breakthrough pain.  He denies any difference in the spine any trauma, he has no shortness of breath, he has bilateral leg pain.  He states he needs a break from his home pain regimen.  No other complaints or issues.     Past Medical History:  Diagnosis Date  . Bone metastasis ([place]) 07/17/2017  . Bone metastasis ([place]) 07/17/2017  . Metastatic renal cell carcinoma [facility])     Patient Active Problem List   Diagnosis Date Noted  . Intractable pain 02/02/2018  . Bone pain   . Right thigh pain   . Cocaine abuse ([place]) 01/29/2018  . Cancer associated pain 01/12/2018  . Pneumonia 12/05/2017  . Goals of care, counseling/discussion 08/01/2017  . Metastatic renal cell carcinoma ([place]) 07/17/2017  . Bone metastasis ([place]) 07/17/2017  . Collapse of right lung 07/15/2017  . Renal cell carcinoma of right kidney ([place]) 07/14/2017    Past Surgical History:  Procedure Laterality Date  . FLEXIBLE BRONCHOSCOPY N/A 07/24/2017   Procedure: FLEXIBLE BRONCHOSCOPY;  Surgeon: [name], MD;  Location: [facility] ORS;  Service: Pulmonary;  Laterality: N/A;  . HERNIA REPAIR    . left middle amputation      with reattachment    Prior to Admission medications   Medication Sig Start Date End Date Taking? Authorizing Provider  albuterol (PROVENTIL HFA;VENTOLIN HFA) 108 (90 Base) MCG/ACT inhaler Inhale 2 puffs into the lungs every 6 (six) hours as needed for wheezing or  shortness of breath. 12/25/17  Yes Earlie Server, MD  amLODipine (NORVASC) 5 MG tablet Take 1 tablet (5 mg total) by mouth daily. 02/03/18  Yes Mody, Ulice Bold, MD  cabozantinib S-Malate (CABOMETYX) 60 MG TABS Take 60 mg by mouth daily. 01/05/18  Yes Earlie Server, MD  gabapentin (NEURONTIN) 300 MG capsule Take 1 capsule (300 mg total) by mouth 3 (three) times daily. 01/21/18  Yes Earlie Server, MD  morphine (MS CONTIN) 15 MG 12 hr tablet Take 1 tablet (15 mg total) by mouth every 12 (twelve) hours. 01/19/18  Yes Earlie Server, MD  morphine (MS CONTIN) 30 MG 12 hr tablet Take 1 tablet (30 mg total) by mouth every 12 (twelve) hours. 02/03/18  Yes Henreitta Leber, MD  omeprazole (PRILOSEC) 20 MG capsule Take 1 capsule (20 mg total) by mouth daily. 01/01/18  Yes Earlie Server, MD  ondansetron (ZOFRAN ODT) 4 MG disintegrating tablet Take 1 tablet (4 mg total) by mouth every 8 (eight) hours as needed for nausea or vomiting. 12/17/17  Yes Earlie Server, MD  oxyCODONE (ROXICODONE) 5 MG immediate release tablet Take 1 tablet (5 mg total) by mouth every 4 (four) hours as needed for severe pain. 02/10/18  Yes Gregor Hams, MD  predniSONE (DELTASONE) 10 MG tablet Take 1 tablet (10 mg total) by mouth daily with breakfast. 01/22/18  Yes Earlie Server, MD  tiotropium (SPIRIVA HANDIHALER) 18 MCG inhalation capsule Place 1 capsule (18 mcg total) into inhaler and inhale daily. 01/12/18  Yes Beckey Rutter  G, NP  oxyCODONE-acetaminophen (PERCOCET) 10-325 MG tablet Take 1 tablet by mouth every 6 (six) hours as needed for pain. Patient not taking: Reported on 02/13/2018 01/06/18   Earlie Server, MD    Allergies Patient has no known allergies.  Family History  Problem Relation Age of Onset  . Cancer Paternal Uncle        brain    Social History Social History   Tobacco Use  . Smoking status: Current Every Day Smoker    Packs/day: 0.25    Years: 38.00    Pack years: 9.50  . Smokeless tobacco: Never Used  Substance Use Topics  . Alcohol use: Yes    Comment:  1-2 beers, mixed drinks on weekends  . Drug use: Yes    Types: "Crack" cocaine    Comment: Patient tested positive for cocaine on 01/12/18    Review of Systems Constitutional: No fever/chills Eyes: No visual changes. ENT: No sore throat. No stiff neck no neck pain Cardiovascular: Denies chest pain. Respiratory: Denies shortness of breath. Gastrointestinal:   no vomiting.  No diarrhea.  No constipation. Genitourinary: Negative for dysuria. Musculoskeletal: Negative lower extremity swelling Skin: Negative for rash. Neurological: Negative for severe headaches, focal weakness or numbness.   ____________________________________________   PHYSICAL EXAM:  VITAL SIGNS: ED Triage Vitals [02/13/18 1103]  Enc Vitals Group     BP (!) 139/93     Pulse Rate 98     Resp 18     Temp 98.3 F (36.8 C)     Temp Source Oral     SpO2 98 %     Weight 223 lb (101.2 kg)     Height _0  (1.702 m)     Head Circumference      Peak Flow      Pain Score 10     Pain Loc      Pain Edu?      Excl. in [place]?     Constitutional: Alert and oriented. Well appearing and in no acute distress. Eyes: Conjunctivae are normal Head: Atraumatic HEENT: No congestion/rhinnorhea. Mucous membranes are moist.  Oropharynx non-erythematous Neck:   Nontender with no meningismus, no masses, no stridor Cardiovascular: Normal rate, regular rhythm. Grossly normal heart sounds.  Good peripheral circulation. Respiratory: Normal respiratory effort.  No retractions. Lungs CTAB. Abdominal: Soft and nontender. No distention. No guarding no rebound Back:  There is no focal tenderness or step off.  there is no midline tenderness there are no lesions noted. there is no CVA tenderness Musculoskeletal strong distal pulses noted, there is tenderness to palpation but no erythema, legs are normal in appearance, no evidence of DVT, soft, compartments.  No upper extremity tenderness. No joint effusions, no DVT signs strong distal pulses  no edema Neurologic:  Normal speech and language. No gross focal neurologic deficits are appreciated.  Skin:  Skin is warm, dry and intact. No rash noted. Psychiatric: Mood and affect are normal. Speech and behavior are normal.  ____________________________________________   LABS (all labs ordered are listed, but only abnormal results are displayed)  Labs Reviewed  CBC - Abnormal; Notable for the following components:      Result Value   HCT 39.0 (*)    RDW 17.3 (*)    All other components within normal limits  BASIC METABOLIC PANEL - Abnormal; Notable for the following components:   Chloride 98 (*)    Glucose, Bld 103 (*)    Calcium 8.8 (*)    All other  components within normal limits    Pertinent labs  results that were available during my care of the patient were reviewed by me and considered in my medical decision making (see chart for details). ____________________________________________  EKG  I personally interpreted any EKGs ordered by me or triage  ____________________________________________  RADIOLOGY  Pertinent labs & imaging results that were available during my care of the patient were reviewed by me and considered in my medical decision making (see chart for details). If possible, patient and/or family made aware of any abnormal findings.  No results found. ____________________________________________    PROCEDURES  Procedure(s) performed: None  Procedures  Critical Care performed: None  ____________________________________________   INITIAL IMPRESSION / ASSESSMENT AND PLAN / ED COURSE  Pertinent labs & imaging results that were available during my care of the patient were reviewed by me and considered in my medical decision making (see chart for details).  She is here for pain control he is already on chronic narcotics but has metastatic cancer to the bone, we are giving him pain medication as a relief.  No evidence of DVT compartment syndrome  infection or other acute pathologies including pathologic break today.  This is not an unusual presentation for this patient it turns out after his metastatic disease.  It is my hope that we can get him more comfortable, have set goals of more comfortable rather than pain-free for him  ----------------------------------------- 1:48 PM on 02/13/2018 -----------------------------------------  Feels much better, he now reveals he is out of all his home narcotic medication including Percocet.  Patient has an appointment he states on Monday and if I can give him Percocet to get through till Monday, he will be okay with that.  I am always concerned about giving narcotic pain medication from the emergency department however this patient does have metastatic cancer in the bone which is 1 of the most painful things that people can have.  We will send him home therefore with Percocet I have explained to him that I do not have the capacity to the right out of the emergency room as a matter policy for morphine and he is very comfortable with the plan as it is.  His pain is much better controlled and we will discharge him    ____________________________________________   FINAL CLINICAL IMPRESSION(S) / ED DIAGNOSES  Final diagnoses:  None      This chart was dictated using voice recognition software.  Despite best efforts to proofread,  errors can occur which can change meaning.      Schuyler Amor, MD 02/13/18 1312    Schuyler Amor, MD 02/13/18 1350

## 2018-02-13 NOTE — Progress Notes (Signed)
[facility] follow up visit  Date of visit: 02/13/18 REASON FOR VISIT Follow up for treatment of metastatic renal cell cancer.  HISTORY OF PRESENT DISEASE/ PERTINENT ONCOLOGY HISTORY 1[name] is  57 y.o. male with 40 pack year smoking histroy is here for evaluation of abnormal image finding. Patient has had right shoulder pain  three months after reaching below a bar to grab a glass. Patient is not able to raise his right arm over shoulder level.  Also having left flank pain associated with pink urine for a few days and resolved after taking a course of amoxicillin. Patient has 40 pack year smoking history.    2 Images:  06/26/2017 CT scan showed a concerning right kidney mass with possible metastatic disease with thoracic and abdominal lymphadenopathy, lung nodules, and diffuse osseous metastases. 07/15/2017 CT PE protocol for evaluation of shortness of breath or chest pain showed no PE, however right lung collapse and rapid progression of lung adenopathy.  08/10/2017 ED visit due to uncontrolled pain: CT hip showed lucent bone lesions in the left hip and left acetabulum consistent with known metastatic disease. No evidence of pathological fracture.  08/11/2017 Bone scan: FINDINGS:Numerous areas of abnormal bony uptake noted in the skull, bilateral scapulae, sternum, cervical and thoracic spine, posterior left tenth rib, and right iliac bone compatible with metastatic disease. Degenerative uptake in the ankles and feet. Soft tissue activity unremarkable. IMPRESSION:Extensive osseous metastatic disease as above.   3 Pathology:     07/10/2017 07/24/2017 A. Lung, right upper lobe, endobronchial biopsy DIAGNOSIS: A. LUNG, RIGHT UPPER LOBE; ENDOBRONCHIAL BIOPSY: - METASTATIC CARCINOMA CONSISTENT WITH CLEAR CELL RENAL CELL CARCINOMA    Bronchoscopy on 07/24/2017 and biopsy revealed metastatic renal cell cancer.   4 Treatment S/p palliative RT to lung and involved bone  mets 07/22/2017. Started nivolumab and ipilimumab, s/p 4 cycles. Started Nivolumab maintenance from 10/15/2017 to 11/26/2017 (5 cycles)   Immunotherapy was held due to radiation pneumonitis.  He has been started on steroids with slow tapering with fluctuating of his respiratory status.  On tapering course of steroids, currently on 14m daily.   # He developed new right femur lesion which was treated by Dr. CDonella Stadeon 12/26/2017.  He reports that he continued to have persistent pain of his right femur after radiation, intermittent, shooting down from his thigh.  # Started on Carbozantinib on 01/08/2018  5 Pain medication:  Patient finished one month supply of percocet in about one week. So his pain medication Rx are being dispensed weekly and he gets one week supply at one time. Multiple discussion about importance of taking pain medication as instructed.  During one of his clinic visit, patient was on the phone talking to someone and RN overheard that he said "[facility], get the money ready".  Currently he has disease progression so that his pain regimen has been recently adjusted. He is on MS contin 453mBID and Percocet 10-32564m6 hour PRN.  01/13/2018 UDS showed positive cocaine. Lauren (Cancer center NP) called him and updated his results.    INTERVAL HISTORY Larry Watkins 57o.  male with above Oncology history present for follow-up for kidney cancer. I ask my CMA to come into clinic room with me to witness our conversation.  He takes Carbozantinib. . Blood pressure is better controlled with Norvasc. He  Complains severe right lowe extremity pain. He has been to ER twice since discharge. He  presented to [facility] ED on 02/04/2018 for pain control. He also presented to [facility] ER on 02/10/2018. He admitted to ER physician there that he used cocaine on 02/09/2018.   He has an appointment with [facility] Oncology on 02/16/2018. He has been referred to pain clinic and patient  says he has not got a call from there yet. He says " I was called by a case worker who says he want's to get in touch with Dr.Jeyren Danowski, no response." We told him that we have been contacted by the case worker and responded.   Patient says that he think he is clean today and requests urine testing and request pain medication to be refilled.   Review of Systems  Constitutional: Positive for malaise/fatigue. Negative for fever and weight loss.  HENT: Negative for ear pain and nosebleeds.   Eyes: Negative for blurred vision, double vision and pain.  Respiratory: Positive for shortness of breath. Negative for cough and sputum production.   Cardiovascular: Negative for chest pain and palpitations.  Gastrointestinal: Negative for abdominal pain, diarrhea, heartburn and nausea.  Genitourinary: Negative for dysuria, flank pain, hematuria and urgency.  Musculoskeletal: Negative for back pain, joint pain and neck pain.       Right hip pain improves.  right mid thigh pain is worse.  Skin: Negative for itching and rash.  Neurological: Negative for dizziness, tingling, sensory change and weakness.  Endo/Heme/Allergies: Does not bruise/bleed easily.  Psychiatric/Behavioral: Negative for suicidal ideas. The patient is not nervous/anxious.      MEDICAL HISTORY: Past Medical History:  Diagnosis Date  . Bone metastasis ([place]) 07/17/2017  . Bone metastasis ([place]) 07/17/2017  . Metastatic renal cell carcinoma ([place])     SURGICAL HISTORY: Past Surgical History:  Procedure Laterality Date  . FLEXIBLE BRONCHOSCOPY N/A 07/24/2017   Procedure: FLEXIBLE BRONCHOSCOPY;  Surgeon: [name], MD;  Location: [facility] ORS;  Service: Pulmonary;  Laterality: N/A;  . HERNIA REPAIR    . left middle amputation      with reattachment    SOCIAL HISTORY: Social History   Socioeconomic History  . Marital status: Single    Spouse name: Not on file  . Number of children: Not on file  . Years of education: Not on file  .  Highest education level: Not on file  Social Needs  . Financial resource strain: Not on file  . Food insecurity - worry: Not on file  . Food insecurity - inability: Not on file  . Transportation needs - medical: Not on file  . Transportation needs - non-medical: Not on file  Occupational History  . Not on file  Tobacco Use  . Smoking status: Current Every Day Smoker    Packs/day: 0.25    Years: 38.00    Pack years: 9.50  . Smokeless tobacco: Never Used  Substance and Sexual Activity  . Alcohol use: Yes    Comment: 1-2 beers, mixed drinks on weekends  . Drug use: Yes    Types: "Crack" cocaine    Comment: Patient tested positive for cocaine on 01/12/18  . Sexual activity: Not on file  Other Topics Concern  . Not on file  Social History Narrative   Independent at baseline.    FAMILY HISTORY Family History  Problem Relation Age of Onset  . Cancer Paternal Uncle        brain    ALLERGIES:  has No Known Allergies.  MEDICATIONS:  Current Outpatient Medications  Medication Sig Dispense Refill  . albuterol (  PROVENTIL HFA;VENTOLIN HFA) 108 (90 Base) MCG/ACT inhaler Inhale 2 puffs into the lungs every 6 (six) hours as needed for wheezing or shortness of breath. 1 Inhaler 2  . amLODipine (NORVASC) 5 MG tablet Take 1 tablet (5 mg total) by mouth daily. 30 tablet 0  . cabozantinib S-Malate (CABOMETYX) 60 MG TABS Take 60 mg by mouth daily. 30 tablet 3  . gabapentin (NEURONTIN) 300 MG capsule Take 1 capsule (300 mg total) by mouth 3 (three) times daily. 90 capsule 3  . predniSONE (DELTASONE) 10 MG tablet Take 1 tablet (10 mg total) by mouth daily with breakfast. 10 tablet 0  . tiotropium (SPIRIVA HANDIHALER) 18 MCG inhalation capsule Place 1 capsule (18 mcg total) into inhaler and inhale daily. 30 capsule 12  . morphine (MS CONTIN) 15 MG 12 hr tablet Take 1 tablet (15 mg total) by mouth every 12 (twelve) hours. (Patient not taking: Reported on 02/13/2018) 28 tablet 0  . morphine (MS  CONTIN) 30 MG 12 hr tablet Take 1 tablet (30 mg total) by mouth every 12 (twelve) hours. (Patient not taking: Reported on 02/13/2018) 3 tablet 0  . omeprazole (PRILOSEC) 20 MG capsule Take 1 capsule (20 mg total) by mouth daily. (Patient not taking: Reported on 02/13/2018) 30 capsule 6  . ondansetron (ZOFRAN ODT) 4 MG disintegrating tablet Take 1 tablet (4 mg total) by mouth every 8 (eight) hours as needed for nausea or vomiting. (Patient not taking: Reported on 02/13/2018) 90 tablet 3  . oxyCODONE (ROXICODONE) 5 MG immediate release tablet Take 1 tablet (5 mg total) by mouth every 4 (four) hours as needed for severe pain. (Patient not taking: Reported on 02/13/2018) 20 tablet 0  . oxyCODONE-acetaminophen (PERCOCET) 10-325 MG tablet Take 1 tablet by mouth every 6 (six) hours as needed for pain. (Patient not taking: Reported on 02/13/2018) 112 tablet 0   No current facility-administered medications for this visit.    Vitals:   02/13/18 1008  BP: (!) 140/96  Pulse: (!) 104  Resp: (!) 22  Temp: 98.5 F (36.9 C)   Filed Weights   02/13/18 1008  Weight: 223 lb (101.2 kg)   Physical Exam  Constitutional: He is oriented to person, place, and time. He appears well-developed. No distress.  HENT:  Head: Normocephalic and atraumatic.  Mouth/Throat: No oropharyngeal exudate.  Eyes: EOM are normal. Pupils are equal, round, and reactive to light. Left eye exhibits no discharge.  Neck: Neck supple. No JVD present.  Cardiovascular: Normal rate, regular rhythm and normal heart sounds. Exam reveals no friction rub.  No murmur heard. Pulmonary/Chest: Effort normal. No respiratory distress. He exhibits no tenderness.  minimal wheezing today on right side.  Left side clear.   Abdominal: Soft. Bowel sounds are normal.  Musculoskeletal: Normal range of motion. He exhibits no deformity.  Lymphadenopathy:    He has no cervical adenopathy.  Neurological: He is alert and oriented to person, place, and time. He  displays normal reflexes. No cranial nerve deficit. Coordination normal.  Skin: Skin is warm and dry. No erythema.  Psychiatric: He has a normal mood and affect. His behavior is normal.    LABORATORY DATA: I have personally reviewed the data as listed: CBC    Component Value Date/Time   WBC 5.3 02/13/2018 0948   RBC 4.40 02/13/2018 0948   HGB 12.5 (L) 02/13/2018 0948   HCT 36.4 (L) 02/13/2018 0948   PLT 337 02/13/2018 0948   MCV 82.8 02/13/2018 0948   MCH 28.4 02/13/2018  0948   MCHC 34.3 02/13/2018 0948   RDW 16.8 (H) 02/13/2018 0948   LYMPHSABS 0.9 (L) 02/13/2018 0948   MONOABS 0.3 02/13/2018 0948   EOSABS 0.1 02/13/2018 0948   BASOSABS 0.0 02/13/2018 0948   CMP Latest Ref Rng & Units 02/13/2018 02/09/2018 02/02/2018  Glucose 65 - 99 mg/dL 157(H) 104(H) 151(H)  BUN 6 - 20 mg/dL _0 Creatinine 0.61 - 1.24 mg/dL 0.71 0.98 0.70  Sodium 135 - 145 mmol/L 135 138 137  Potassium 3.5 - 5.1 mmol/L 4.1 4.0 3.5  Chloride 101 - 111 mmol/L 99(L) 101 103  CO2 22 - 32 mmol/L _1 Calcium 8.9 - 10.3 mg/dL 8.7(L) 9.0 8.5(L)  Total Protein 6.5 - 8.1 g/dL 7.3 7.8 6.7  Total Bilirubin 0.3 - 1.2 mg/dL 0.3 0.4 0.4  Alkaline Phos 38 - 126 U/L 115 102 100  AST 15 - 41 U/L _2 ALT 17 - 63 U/L _3 RADIOGRAPHIC STUDIES: I have personally reviewed the radiological images as listed and agree with the findings in the report MRI 12/15/2017. Patient has multiple metastatic deposits identified. He hasn't a lesion in the inferior aspect of the base of the right femoral neck measuring 1.8 cm cranial caudal by 0.7 cm AP by approximately 1.3 cm transverse. Largest visualized lesion is in the anterior right ilium measuring 4 cm transverse by 5.2 cm AP by 4.6 cm cranial caudal. There is also a lesion in the left femur measuring 2.7 and a meter by 2.0 cm x 2.5 cm. Additional scattered lesions are identified including punctate lesion in the posterior right acetabulum and essentially in the  femoral head.  CT 12/26/2017 1. Stable appearance of large mass in the lower pole of the right kidney. 2. Evolving postradiation changes in the right lung. Several enlarging and new nodular appearing areas in the right lung, as above. Whether or not these reflect evolving metastatic disease or evolving postradiation changes is uncertain. Continued attention on follow-up studies is recommended. 3. Extensive mediastinal and hilar lymphadenopathy redemonstrated,as above. Similar appearance of retroperitoneal lymphadenopathycompared to the prior study. No other new signs of metastaticdisease elsewhere in the abdomen or pelvis.  4. Widespread metastatic disease to the bones very similar to the prior examination. 5. Aortic atherosclerosis, in addition to left anterior descending coronary artery disease. Please note that although the presence of coronary artery calcium documents the presence of coronary artery disease, the severity of this disease and any potential stenosis cannot be assessed on this non-gated CT examination. Assessment for potential risk factor modification, dietary therapy or pharmacologic therapy may be warranted, if clinically indicated.   ASSESSMENT/PLAN Cancer Staging Renal cell carcinoma of right kidney [facility]) Staging form: Kidney, AJCC 8th Edition - Clinical stage from 07/14/2017: Stage IV (cT3a, cNX, pM1) - Signed by Earlie Server, MD on 07/15/2017 - Pathologic: No stage assigned - Unsigned  1. Renal cell carcinoma of right kidney ([place])   2. Substance abuse ([place])   3. Bone metastasis ([place])   4. Cancer associated pain   # Renal cell carcinoma, continue Carbozantinib. Tolerates well except hypertension which is now controlled with Norvasc 2.34m daily.   #Right hip pain with MRI showing new right femur neck lesion.  Status post palliative radiation. Continue to have significant pain. Recent X ray showed no pathological fracture.    # Substance abuse: He was on MS Contin 45 mg every  12 hours, and Percocet every 4-6 hours as  needed. His toxicology labs were positive for cocaine on 01/12/2018.  I have had a lengthy discussion with patient at that time and he told me it was just only one time use.  And he agreed not using substances any more and has signed pain contract. On January 30, 2018, I saw patient in the clinic and he request pain medication refill. I ask him to give a sample of urine for UDS testing before refilling his pain medication. He refused to be tested and says" I am not ready today to be tested", indicating that he maybe using cocaine.  He has since then been admitted to floor, and a few ED visits and admitted to ER physician with cocaine use again.  I discussed with patient today that given his history of substance abuse, and also a questionable history selling his pain medication  (see previous notes),  I am not comfortable renewing his oral narcotics. It is challenging to distinguish whether he is having more pain due to disease progression given his aggressive disease or seeking narcotics, or both.  We have referred him to pain clinic.   He reports excruciating pain of right lower extremity at this moment and our infusion center does not have capacity to give him IV pain medication right now.  Patient becomes angry and he prefers to go to ER for pain control.  # Xgeva monthly. He is overdue. Patient prefers to go to ER for pain control.  # Follow up: patient is angry as I referred him to pain clinic for pain management. He has appointment next Monday with [facility] oncology. He says" I am not coming back". Will not schedule additional appointment.    Earlie Server, MD, PhD Hematology Oncology [facility] Pager- [phone] 02/13/2018

## 2018-02-16 ENCOUNTER — Ambulatory Visit: Admit: 2018-02-16 | Discharge: 2018-02-17 | Payer: MEDICAID

## 2018-02-16 ENCOUNTER — Ambulatory Visit: Admit: 2018-02-16 | Discharge: 2018-02-17 | Payer: MEDICAID | Attending: Internal Medicine | Primary: Internal Medicine

## 2018-02-16 DIAGNOSIS — F1910 Other psychoactive substance abuse, uncomplicated: Secondary | ICD-10-CM

## 2018-02-16 DIAGNOSIS — C649 Malignant neoplasm of unspecified kidney, except renal pelvis: Principal | ICD-10-CM

## 2018-02-16 DIAGNOSIS — C7951 Secondary malignant neoplasm of bone: Principal | ICD-10-CM

## 2018-02-16 MED FILL — MORPHINE ER/30MG/TAB: MORPHINE ER/30MG/TAB | 7 days supply | Qty: 21 | Fill #0

## 2018-02-16 MED FILL — OXYCODONE HCL/10MG/TABS: OXYCODONE HCL/10MG/TABS | 7 days supply | Qty: 28 | Fill #0

## 2018-02-23 ENCOUNTER — Ambulatory Visit: Admit: 2018-02-23 | Discharge: 2018-02-24 | Payer: MEDICAID

## 2018-02-23 ENCOUNTER — Other Ambulatory Visit: Admit: 2018-02-23 | Discharge: 2018-02-24 | Payer: MEDICAID

## 2018-02-23 ENCOUNTER — Ambulatory Visit: Admit: 2018-02-23 | Discharge: 2018-02-24 | Payer: MEDICAID | Attending: Internal Medicine | Primary: Internal Medicine

## 2018-02-23 DIAGNOSIS — C649 Malignant neoplasm of unspecified kidney, except renal pelvis: Secondary | ICD-10-CM

## 2018-02-23 DIAGNOSIS — C7951 Secondary malignant neoplasm of bone: Principal | ICD-10-CM

## 2018-02-23 MED FILL — MORPHINE ER/30MG/TAB: MORPHINE ER/30MG/TAB | 7 days supply | Qty: 21 | Fill #0

## 2018-02-23 MED FILL — OXYCODONE HCL/10MG/TABS: OXYCODONE HCL/10MG/TABS | 7 days supply | Qty: 28 | Fill #0

## 2018-02-27 ENCOUNTER — Ambulatory Visit: Admit: 2018-02-27 | Discharge: 2018-02-27 | Payer: MEDICAID

## 2018-02-27 DIAGNOSIS — C649 Malignant neoplasm of unspecified kidney, except renal pelvis: Secondary | ICD-10-CM

## 2018-02-27 DIAGNOSIS — C7951 Secondary malignant neoplasm of bone: Principal | ICD-10-CM

## 2018-03-02 ENCOUNTER — Ambulatory Visit: Admit: 2018-03-02 | Discharge: 2018-03-03 | Payer: MEDICAID

## 2018-03-02 ENCOUNTER — Ambulatory Visit: Admit: 2018-03-02 | Discharge: 2018-03-03 | Payer: MEDICAID | Attending: Internal Medicine | Primary: Internal Medicine

## 2018-03-02 ENCOUNTER — Other Ambulatory Visit: Admit: 2018-03-02 | Discharge: 2018-03-03 | Payer: MEDICAID

## 2018-03-02 DIAGNOSIS — C7951 Secondary malignant neoplasm of bone: Principal | ICD-10-CM

## 2018-03-02 DIAGNOSIS — C649 Malignant neoplasm of unspecified kidney, except renal pelvis: Secondary | ICD-10-CM

## 2018-03-02 MED ORDER — PREDNISONE 20 MG TABLET: 60 mg | tablet | Freq: Every day | 0 refills | 0 days | Status: AC

## 2018-03-02 MED ORDER — AMLODIPINE 5 MG TABLET: 5 mg | tablet | 5 refills | 0 days

## 2018-03-02 MED ORDER — MORPHINE ER 60 MG TABLET,EXTENDED RELEASE
ORAL_TABLET | Freq: Two times a day (BID) | ORAL | 0 refills | 0 days | Status: CP
Start: 2018-03-02 — End: 2018-03-16

## 2018-03-02 MED ORDER — OXYCODONE 10 MG TABLET
ORAL_TABLET | Freq: Four times a day (QID) | ORAL | 0 refills | 0 days | Status: CP | PRN
Start: 2018-03-02 — End: 2018-03-16

## 2018-03-02 MED ORDER — PREDNISONE 20 MG TABLET
ORAL_TABLET | Freq: Every day | ORAL | 0 refills | 0.00000 days | Status: CP
Start: 2018-03-02 — End: 2018-03-02

## 2018-03-02 MED ORDER — AMLODIPINE 5 MG TABLET
ORAL_TABLET | Freq: Every day | ORAL | 5 refills | 0.00000 days | Status: CP
Start: 2018-03-02 — End: 2018-05-09

## 2018-03-02 MED FILL — AMLODIPINE/5MG/TABS: AMLODIPINE/5MG/TABS | 90 days supply | Qty: 90 | Fill #0

## 2018-03-02 MED FILL — OXYCODONE HCL/10MG/TABS: OXYCODONE HCL/10MG/TABS | 14 days supply | Qty: 56 | Fill #0

## 2018-03-02 MED FILL — PREDNISONE/20MG/TABS: PREDNISONE/20MG/TABS | 14 days supply | Qty: 42 | Fill #0

## 2018-03-02 MED FILL — MORPHINE/60MG ER/TAB: MORPHINE/60MG ER/TAB | 14 days supply | Qty: 28 | Fill #0

## 2018-03-10 ENCOUNTER — Ambulatory Visit: Admit: 2018-03-10 | Discharge: 2018-03-11 | Payer: MEDICAID

## 2018-03-12 ENCOUNTER — Ambulatory Visit
Admit: 2018-03-12 | Discharge: 2018-03-12 | Disposition: A | Discharge status: Home / Self Care | Payer: MEDICAID | Attending: Emergency Medicine

## 2018-03-12 MED ORDER — MAGIC MOUTHWASH (NYSTATIN/DIPHENHYDRAMINE/MYLANTA) ORAL MIXTURE
Freq: Four times a day (QID) | ORAL | 0 refills | 0 days | Status: CP | PRN
Start: 2018-03-12 — End: 2018-04-16

## 2018-03-12 MED ORDER — OXYCODONE 10 MG TABLET
ORAL_TABLET | ORAL | 0 refills | 0 days | Status: CP | PRN
Start: 2018-03-12 — End: 2018-03-16

## 2018-03-12 MED ORDER — MORPHINE ER 60 MG TABLET,EXTENDED RELEASE
ORAL_TABLET | Freq: Two times a day (BID) | ORAL | 0 refills | 0 days | Status: CP
Start: 2018-03-12 — End: 2018-03-16

## 2018-03-16 ENCOUNTER — Ambulatory Visit: Admit: 2018-03-16 | Discharge: 2018-03-17 | Payer: MEDICAID | Attending: Internal Medicine | Primary: Internal Medicine

## 2018-03-16 ENCOUNTER — Ambulatory Visit: Admit: 2018-03-16 | Discharge: 2018-03-17 | Payer: MEDICAID

## 2018-03-16 ENCOUNTER — Other Ambulatory Visit: Admit: 2018-03-16 | Discharge: 2018-03-17 | Payer: MEDICAID

## 2018-03-16 DIAGNOSIS — C7951 Secondary malignant neoplasm of bone: Principal | ICD-10-CM

## 2018-03-16 DIAGNOSIS — C649 Malignant neoplasm of unspecified kidney, except renal pelvis: Secondary | ICD-10-CM

## 2018-03-16 MED ORDER — CABOZANTINIB 40 MG TABLET: 40 mg | tablet | Freq: Every day | 5 refills | 0 days | Status: AC

## 2018-03-16 MED ORDER — GABAPENTIN 300 MG CAPSULE: capsule | 11 refills | 0 days

## 2018-03-16 MED ORDER — ONDANSETRON 4 MG DISINTEGRATING TABLET
ORAL_TABLET | Freq: Three times a day (TID) | ORAL | 3 refills | 0.00000 days | Status: SS | PRN
Start: 2018-03-16 — End: 2018-05-08

## 2018-03-16 MED ORDER — CABOZANTINIB 40 MG TABLET
ORAL_TABLET | Freq: Every day | ORAL | 5 refills | 0.00000 days | Status: CP
Start: 2018-03-16 — End: 2018-04-16

## 2018-03-16 MED ORDER — GABAPENTIN 300 MG CAPSULE
ORAL_CAPSULE | Freq: Three times a day (TID) | ORAL | 11 refills | 0.00000 days | Status: SS
Start: 2018-03-16 — End: 2018-04-16

## 2018-03-16 MED ORDER — OXYCODONE 10 MG TABLET
ORAL_TABLET | Freq: Four times a day (QID) | ORAL | 0 refills | 0 days | Status: CP | PRN
Start: 2018-03-16 — End: 2018-03-24

## 2018-03-16 MED ORDER — MORPHINE ER 60 MG TABLET,EXTENDED RELEASE
ORAL_TABLET | Freq: Two times a day (BID) | ORAL | 0 refills | 0 days | Status: CP
Start: 2018-03-16 — End: 2018-03-24

## 2018-03-16 MED ORDER — ONDANSETRON 4 MG DISINTEGRATING TABLET: 8 mg | tablet | Freq: Three times a day (TID) | 3 refills | 0 days | Status: SS

## 2018-03-16 MED FILL — OXYCODONE HCL/10MG/TABS: OXYCODONE HCL/10MG/TABS | 7 days supply | Qty: 28 | Fill #0

## 2018-03-16 MED FILL — ONDANSETRON ODT/4MG/TBDP: ONDANSETRON ODT/4MG/TBDP | 10 days supply | Qty: 60 | Fill #0

## 2018-03-16 MED FILL — GABAPENTIN/300MG/CAPS: GABAPENTIN/300MG/CAPS | 30 days supply | Qty: 180 | Fill #0

## 2018-03-16 NOTE — Unmapped (Signed)
Genitourinary Oncology Clinic Follow-Up Note    Patient Name: Russell [name]  Encounter Date: 03/16/2018    Assessment/Plan:    57 y.o. with metastatic RCC (unknown histology) s/p ipi/nivo with POD, now on cabozantinib.    1. 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 He has intermediate risk disease at diagnosis based on time to initiation of treatment with PS status unknown. We have also reviewed the difference between clear cell vs non-clear cell, and will send [facility] testing on his tumor to see if any actionable mutations or if VHL mutation present to suggest clear cell histology. Unclear the status of his cancer since starting cabozantinib in 01/2018 as some regression in his LN, but worsening pain in his right hip and worsening nodularity in his lungs. 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 Unfortunately he did not take steroids as prescribed two weeks ago and therefore lack of improvement does not rule out pneumonitis. Will therefore plan to give prednisone 60mg  and repeat CT CAP (full staging) in 3 weeks to assess full extent of disease. If POD, would change to lenvatinib + everolimus.    He also is developing side effects to cabozantinib and reports an intolerable amount of mouth soreness today. Also having some skin breakdown and will dose reduce to 40mg  daily.    2. Pain: Increased to MS contin 60mg  BID at last visit and have been trying to get him in to see our palliative care team. He has a visit tomorrow and I therefore will not make any more changes to narcotic medications today and I gave him a one week supply. He has had issues with uncontrolled pain and using up Rx prior to expiration of prescription and essentially established care at [facility] due to this issue with his prior MD. His scans clearly show lots of reason for terrible pain and he does likely need narcotics to control. Will increase gabapentin from 300 to 600mg  TID. We can consider XRT to left femur but major site of pain is right side which has already been radiated x 2.    Plan:  - Continue cabozantinib for now but dose reduce to 40  - Pain management to see tomorrow: MS contin to 60 BID, continue oxycodone  - Pain meds x 1 week refilled  - Increase gabapentin  - Prednisone 60mg  with repeat CT CAP in 3 weeks  - Katina Degree from pharmacy to see to discuss creams/mouthwashes for cabo AEs  - RTC 3 weeks    History of Present Illness:    Russell [name] is a 57 y.o. male with metastatic RCC who is seen in follow-up for his RCC.    He initially presented with shoulder pain in the summer 2018 and was found to have a large renal mass with metastatic disease to lung, LN and bone. This was quickly followed by right lung collapse and rapid POD in the lung LN. Biopsy of his RUL tumor showed RCC (subtype not specified). 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 He then had progressively worsening right hip pain and was found to have new right femur and iliac bone mets, s/p XRT completed 01/2018 (800cGy x  1). He then started cabozantinib 01/08/18 complicated by HTN which was treated with amlodipine.    Today he reports continued pain and cough. No diarrhea, but he is still losing weight. He also complains of new mouth blisters x 2 weeks; not eating well. He also has elbow pain and pain on back of his heels. Currently using lotion but not urea cream. He is now using [employer] mouthwash as well, which is helping a bit. He is taking prednisone 20mg  daily (did not take Rx for 60mg  as prescribed).    Past Medical History:  Past Medical History:   Diagnosis Date   ??? Bone metastasis (CMS-HCC)    ??? Renal cancer (CMS-HCC)    COPD    Medications:    Current Outpatient Medications on File Prior to Visit   Medication Sig Dispense Refill   ??? albuterol (PROVENTIL HFA;VENTOLIN HFA) 90 mcg/actuation inhaler Inhale 2 puffs.     ??? aluminum-magnesium hydroxide-simethicone 200-200-20 mg/5 mL Susp 80 mL, diphenhydrAMINE 12.5 mg/5 mL Liqd 200 mg, nystatin 100,000 unit/mL Susp 8,000,000 Units, distilled water Liqd 80 mL Take 5 mL by mouth every six (6) hours as needed. Swish, gargle, spit as needed.  May be swallowed if esophageal involvement. 200 mL 0   ??? amLODIPine (NORVASC) 5 MG tablet Take 1 tablet (5 mg total) by mouth daily. 90 tablet 5   ??? cabozantinib (CABOMETYX) 60 mg Tab Take 60 mg by mouth daily.     ??? gabapentin (NEURONTIN) 300 MG capsule Take 300 mg by mouth Three (3) times a day.      ??? ibuprofen (ADVIL,MOTRIN) 600 MG tablet Take 600 mg by mouth every eight (8) hours as needed for pain.     ??? lidocaine (LIDODERM) 5 % patch Place 1 patch on the skin daily. Apply to affected area for 12 hours only each day (then remove patch)     ??? MORPhine (MS CONTIN) 60 MG 12 hr tablet Take 1 tablet (60 mg total) by mouth Two (2) times a day. 28 tablet 0   ??? MORPhine (MS CONTIN) 60 MG 12 hr tablet Take 1 tablet (60 mg total) by mouth Two (2) times a day. for 5 days 10 tablet 0   ??? omeprazole (PRILOSEC) 20 MG capsule Take 20 mg by mouth daily. TAKE 1 CAPSULE (20 MG TOTAL) BY MOUTH DAILY.  6   ??? ondansetron (ZOFRAN-ODT) 4 MG disintegrating tablet PLACE 1 TABLET ON TONGUE BY MOUTH EVERY 8 HOURS AS NEEDED FOR NAUSEA OR VOMITING  3   ??? oxyCODONE (ROXICODONE) 10 mg immediate release tablet Take 1 tablet (10 mg total) by mouth every six (6) hours as needed for pain. 56 tablet 0   ??? oxyCODONE (ROXICODONE) 10 mg immediate release tablet Take 1 tablet (10 mg total) by mouth every four (4) hours as needed for pain. for up to 5 days 13 tablet 0   ??? predniSONE (DELTASONE) 20 MG tablet Take 3 tablets (60 mg total) by mouth daily. 42 tablet 0   ??? tiotropium (SPIRIVA WITH HANDIHALER) 18 mcg inhalation capsule Place 18 mcg into inhaler and inhale.       No current facility-administered medications on file prior to visit.      Allergies:  No Known Allergies    Social History:  Lives in [place] with his long-time friend, who accompanies him today. 40 py h/o smoking. Not working.    Family History:  Grandmother sisters all died of some cancers. Paternal uncle had cancer but unknown  type.    Review of Systems: A 10 system review was completed and was negative except per HPI    Physical Exam:  Vitals:    03/16/18 1304   BP: 162/88   Pulse: 75   Resp: 18   Temp: 36.4 ??C (97.6 ??F)   SpO2: 97%     ECOG PS 2    General:   No acute distress, alert and interactive   Eyes:   Extra ocular muscles intact.  Sclera not icteric.   ENT:  Oropharynx with occasional blister in the mouth.   Neck:  Supple, no thyromegaly.   Lymph Nodes:  No adenopathy (cervical, axillary, inguinal)   Cardiovascular:  RRR without murmurs, rubs, gallops   Lungs:  Clear to auscultation bilaterally except for occasional crackles.   Skin:    No rash, lesions or breakdown    Abdomen:   Abdomen soft, not tender and not distended, no hepatosplenomegaly or masses.   Extremities:   Warm and well-perfused with pain over right femur with any palpation.   Neurological:  Alert and oriented to person, place and time.     Labs:    Reviewed in Epic    At diagnosis (07/10/17)  WBC 7.6 Hgb 14.8 Plt 251 ANC 4.9 (all normal)  Ca 8.7    Imaging:    02/02/18 Femur xray:  1. No acute or healing fracture.  2. Mild degenerative changes of the knee and hip.  3. Known metastases in the proximal right femur are not well seen by  x-ray.  4. Lytic lesion at the anterior iliac spine.    12/26/17 CT chest  1. Stable appearance of large mass in the lower pole of the right kidney.  2. Evolving postradiation changes in the right lung. Several enlarging and new nodular appearing areas in the right lung, as above. Whether or not these reflect evolving metastatic disease or evolving postradiation changes is uncertain. Continued attention on follow-up studies is recommended.  3. Extensive mediastinal and hilar lymphadenopathy redemonstrated, as above. Similar appearance of retroperitoneal lymphadenopathy compared to the prior study. No other new signs of metastatic disease elsewhere in the abdomen or pelvis.  4. Widespread metastatic disease to the bones very similar to the prior examination.  5. Aortic atherosclerosis, in addition to left anterior descending coronary artery disease. Please note that although the presence of coronary artery calcium documents the presence of coronary artery disease, the severity of this disease and any potential stenosis cannot be assessed on this non-gated CT examination. Assessment for potential risk factor modification, dietary therapy or pharmacologic therapy may be warranted, if clinically indicated.    12/15/17 MR right hip  Multifocal metastatic disease as described above. There are some  lesions in the right hip and acetabulum. Largest lesion is in the  right ilium. Negative for fracture. The exam is otherwise  Unremarkable.    02/23/18 CT CAP:  - Previously noted pulmonary nodules appear increased in number and size. The largest of these lesions is currently in the right lower lobe now a 2.9 x 4.5 cm mass (axial image 62 of series 4). An adjacent mass is also noted in the right lower lobe (axial image 57 of series 4) measuring 3.3 x 2.6 cm. Small right pleural effusion appears to be loculated posteriorly. Several left-sided pulmonary nodules have also increased in number and size, largest of which is in the anterior aspect of the left lower lobe currently measuring 1.3 x 1.0 cm (axial image 68 of series  4).  - Slight progression of the primary lesion in the lower pole of the right kidney, now with loss of the intervening fat plane separating the lesion from the adjacent right psoas muscle, concerning for early or pending direct invasion of the psoas.    - Some regression of adjacent retrocaval lymphadenopathy  - New tiny hypovascular lesion in segment 3 of the liver which is nonspecific, but suspicious for potential metastatic lesion  - Previously noted mediastinal and hilar lymphadenopathy has slightly regressed compared to the prior study.   - Previously noted osseous metastatic disease appears very similar to the prior study.    MRI Brain 02/27/18  No acute intracranial abnormality.    MRI RLE 02/27/18  Extensive osseous metastatic disease of the of the visualized osseous structures (bilateral femurs and visualized lower pelvis). Plain radiographs of the femurs might be useful for further characterization degree of osseous erosion and better indication of potential impending pathologic fracture (especially in the region of the subtrochanteric proximal left femur).    Pathology:    (Outside case: [id number], 6 slides, collected on 07/10/2017)  ??  A. Bone, right iliac crest lytic lesion, CT-guided biopsy  - Metastatic carcinoma, compatible with renal primary (see comment)  ??  (Outside case: [id number], 2 slides, collected on 07/24/2017)  ??  A. Lung, right upper lobe, endobronchial biopsy  - Metastatic carcinoma, compatible with renal primary (see comment)    Immunohistochemical stains for both cases ([id number] and [id number]) were performed at the originating institution and are reviewed at [facility]:  [phone] (right iliac bone biopsy): Tumor cells are positive for PAX-8 and negative for GATA-3.   [id number] (right upper lobe lung biopsy): Tumor cells are positive for PAX-8. Additional stains performed were not sent for review. By report, the tumor was negative for CK7 and TTF-1.   ??  Given the clinical history of a large right renal mass, the morphology and immunoprofile for both of these metastatic lesions is most compatible with a metastasis from a renal primary. Correlation with clinical and imaging findings is recommended.

## 2018-03-16 NOTE — Unmapped (Signed)
Clinical Pharmacist Practitioner: [facility]cology Clinic    Oral Chemotherapy Follow-Up    Assessment and recommendations:  1. Cabozantinib monitoring and side effect management- patient has several side effects including hand foot syndrome, mucositis, and HTN.   - Hold cabozantinib and restart at 40 mg daily.   - monitor BP at home and report BP >150/90  -Start urea cream on hands feet and elbows twice daily  - use magic mouthwash as needed for mouth sores    Follow- up: Palliative Care visit tomorrow 03/17/18 and Dr. Okey Dupre on 04/06/18    ______________________________________________________________________    Oral Chemotherapy: Cabozantinib 60 mg daily      Start date:     HPI: 57 y.o. with metastatic RCC (unknown histology), he has intermediate risk disease at diagnosis based on time to initiation of treatment with PS status unknown s/p ipi/nivo with POD, now on cabozantinib.    Interim History: Russell [name] is having some discomfort, he has sores in his mouth, his elbows are sore and cracked. He also has some pain in his feet. His blood pressure is elevated today he takes amlodipine the wife thinks it is 10 mg daily but cant be sure.     Toxicities: hand foot syndrome, mucositis and hypertension    Drug-Drug Interactions: none    Medications:  Current Outpatient Medications   Medication Sig Dispense Refill   ??? albuterol (PROVENTIL HFA;VENTOLIN HFA) 90 mcg/actuation inhaler Inhale 2 puffs.     ??? aluminum-magnesium hydroxide-simethicone 200-200-20 mg/5 mL Susp 80 mL, diphenhydrAMINE 12.5 mg/5 mL Liqd 200 mg, nystatin 100,000 unit/mL Susp 8,000,000 Units, distilled water Liqd 80 mL Take 5 mL by mouth every six (6) hours as needed. Swish, gargle, spit as needed.  May be swallowed if esophageal involvement. 200 mL 0   ??? amLODIPine (NORVASC) 5 MG tablet Take 1 tablet (5 mg total) by mouth daily. 90 tablet 5   ??? cabozantinib (CABOMETYX) 60 mg Tab Take 60 mg by mouth daily.     ??? gabapentin (NEURONTIN) 300 MG capsule Take 2 capsules (600 mg total) by mouth Three (3) times a day. 180 capsule 11   ??? ibuprofen (ADVIL,MOTRIN) 600 MG tablet Take 600 mg by mouth every eight (8) hours as needed for pain.     ??? lidocaine (LIDODERM) 5 % patch Place 1 patch on the skin daily. Apply to affected area for 12 hours only each day (then remove patch)     ??? MORPhine (MS CONTIN) 60 MG 12 hr tablet Take 1 tablet (60 mg total) by mouth Two (2) times a day. 14 tablet 0   ??? omeprazole (PRILOSEC) 20 MG capsule Take 20 mg by mouth daily. TAKE 1 CAPSULE (20 MG TOTAL) BY MOUTH DAILY.  6   ??? ondansetron (ZOFRAN-ODT) 4 MG disintegrating tablet Take 2 tablets (8 mg total) by mouth every eight (8) hours as needed for nausea. 60 tablet 3   ??? oxyCODONE (ROXICODONE) 10 mg immediate release tablet Take 1 tablet (10 mg total) by mouth every six (6) hours as needed for pain. 28 tablet 0   ??? predniSONE (DELTASONE) 20 MG tablet Take 3 tablets (60 mg total) by mouth daily. 42 tablet 0   ??? tiotropium (SPIRIVA WITH HANDIHALER) 18 mcg inhalation capsule Place 18 mcg into inhaler and inhale.       No current facility-administered medications for this visit.      I spent 10 minutes with Russell [name] in direct patient care.     Katie P.  Idona Stach PharmD, BCOP, CPP  Hematology/Oncology Pharmacist  P: [phone]

## 2018-03-17 ENCOUNTER — Ambulatory Visit
Admit: 2018-03-17 | Discharge: 2018-03-18 | Payer: MEDICAID | Attending: Geriatric Medicine | Primary: Geriatric Medicine

## 2018-03-17 DIAGNOSIS — C7951 Secondary malignant neoplasm of bone: Secondary | ICD-10-CM

## 2018-03-17 DIAGNOSIS — C649 Malignant neoplasm of unspecified kidney, except renal pelvis: Secondary | ICD-10-CM

## 2018-03-17 DIAGNOSIS — G893 Neoplasm related pain (acute) (chronic): Principal | ICD-10-CM

## 2018-03-17 DIAGNOSIS — F1910 Other psychoactive substance abuse, uncomplicated: Secondary | ICD-10-CM

## 2018-03-17 DIAGNOSIS — Z515 Encounter for palliative care: Secondary | ICD-10-CM

## 2018-03-17 MED FILL — MORPHINE/60MG ER/TAB: MORPHINE/60MG ER/TAB | 7 days supply | Qty: 14 | Fill #0

## 2018-03-17 NOTE — Unmapped (Signed)
Pt's wife was calling to see if xrays were needed. She remembers it being mentioned but nothing has been scheduled. She can be reached at [phone].  Thanks in advance,  Deatra Ina  [facility]  [phone]

## 2018-03-17 NOTE — Unmapped (Signed)
Pt's wife stated that a prescription for prednisone was not included yesterday. She can be reached at [phone].  Thanks in advance,  Deatra Ina  [facility]  [phone]

## 2018-03-17 NOTE — Unmapped (Signed)
Patient called to return a call to confirm pharmacy.  They would like the prednisone prescription sent to [facility] [place] [address] (aka [address] [facility] - [place], [place]).    The pharmacy is now on file.    Thanks in advance,  Vernie Ammons  [facility]  [phone]

## 2018-03-17 NOTE — Unmapped (Signed)
[facility] Specialty Medication Referral: No PA required    Medication (Brand/Generic): CABOMETYX    Initial FSI Test Claim completed with resulted information below:  No PA required  Patient ABLE to fill at [facility]  Insurance Company:  MEDICAID  Anticipated Copay: $3    As Co-pay is under $100 defined limit, per policy there will be no further investigation of need for financial assistance at this time unless patient requests. This referral has been communicated to the provider and handed off to the [facility] team for further processing and filling of prescribed medication.   ______________________________________________________________________  Please utilize this referral for viewing purposes as it will serve as the central location for all relevant documentation and updates.

## 2018-03-17 NOTE — Unmapped (Addendum)
OUTPATIENT ONCOLOGY PALLIATIVE CARE    Principal Diagnosis: Russell [name] is a 57 y.o. 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 He then had XRT to right femur/hip completed 01/2018. He is now being treated with cabozantinib started  01/08/18 along with Prednisone 60mg  daily.    Per [facility] records, pt has previously been positive for cocaine on multiple urine drug tests, has refused urine drug tests on occasion, and has also refused to be seen in a pain management clinic when [facility] providers became uncomfortable with continue to refill prescriptions due to concern that pt may have been diverting/selling pain medication.     When informed his urine sample collection today would need to be witnessed, pt refused and stated, If you don't trust me, I can't trust you- you're treating me like a criminal, and left the clinic.    Assessment/Plan: No prescriptions sent today as pt left clinic appointment before agreeing on plan for pain management.    1. Pain, cancer related in R hip and femur at site of metastasis - No prescriptions sent today as pt left clinic appointment before agreeing on plan for pain management. If pt returns to clinic and agrees to witnessed urine drug screens, would proceed with plan below:  -- Increase MS Contin 60mg  from BID to TID.  -- Continue Oxycodone 10mg  Q4hrs prn - would prescribe for up to 6 doses a day, but informed pt early refills will not be given.   -- Continue Gabapentin 300mg  TID - was increased to 600mg  TID at last visit, however pt has been taking his Gabapentin 900mg  in the AM instead of spaced 300mg  TID- informed pt for better effect he should be taking TID. Question neuropathic pain component, and if pt did not see improvement of pain with this dosing would consider discontinuing.  -- Continue Prednisone 60mg  (continue Omeprazole for GI ppx).  -- Continue Ibuprofen prn   -- Pain contract signed during visit before pt left.      2. Bowel Regimen: pt denies constipation currently  -- Senna 2 tabs QHS prn if not having daily bowel movements.  -- Miralax 1 capful prn for hard stools.      3. Controlled substances risk management.   - Patient has a signed pain medication agreement with Outpatient Palliative Care, completed on 03/17/18, as per standard of care.   - [place] database was reviewed today and it was appropriate.   - Urine drug screen was not performed at this visit. Findings: pt refused witnessed sample collection today..   - Patient has received information about safe storage and administration of medications.   - Patient has not received a prescription for narcan.       F/u: if pt willing to undergo witnessed urine sample collection for drug screen, happy to see pt back and assume pain management role.    ----------------------------------------  Referring Provider: Salem Caster MD  Oncology Team: [facility]  PCP: Ihor Austin, MD      HPI: Russell [name] is a 57 y.o. 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 He then had XRT to right femur/hip completed 01/2018. He is now being treated with cabozantinib started  01/08/18 along with Prednisone 60mg  daily.    Today Russell [name] shares that he is aware his cancer has spread throughout his body, but is hoping his current chemotherapy will get rid of this stuff. He does want his providers to be direct with him about his prognosis, however, and while he shares he does not enjoy talking about his cancer, would want his providers to be straight-forward and honest about his prognosis should his cancer fail to respond to current therapies. In terms of symptoms, Russell [name] shares his pain is worst in his right thigh and hip, feels like someone stabbing me with an ice pick, is 10/10 (NRS) at its worst, and 2/10 after taking 10mg  Oxycodone which he reports using three times a day. Pt shares that Ibuprofen and lidocaine patches are also helpful in alleviating pain somewhat, though not as effective as Oxycodone. He often wakes at night with pain. He shares he previously had poor appetite, but this has improved on increasing his Prednisone dose from 20 to 60mg  a day. He otherwise denies significant symptoms, other than mild nausea after taking his medications in the morning that is resolved with Zofran. He denies HA, SOB, chest pain, abdominal pain, persistent nausea, constipation, depression, anxiety, or any other symptoms.    Regarding advance care planning, Russell [name] shares he has not thought much about what kind of interventions he would or would not want were he to become seriously ill, though he does want his HCPOA to be his friends [name] and [name], not any of his immediate family members.    Symptom Review:  General: See HPI  Pain: See HPI  Fatigue: Denies   Mobility: Impacted by R leg pain   Sleep: Affected by pain  Appetite: Denies problems while on steroids3  Nausea: Denies significant nausea  Bowel function: Denies constipation  Dyspnea: Denies  Secretions: Denies  Mood: Denies problems with mood    Palliative Performance Scale: 70% - Ambulation: Reduced / unable to do normal work, some evidence of disease / Self-Care: Full / Intake: Normal or reduced / Level of Conscious: Full    Coping/Support Issues: Issues with previously documented cocaine use.    Goals of Care: Pt currently focused on symptom relief, though wishes to continue any available cancer-directed treatments.    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  Hobbies: Previously played softball, likes to shoot pool at bars with his mom.  Current residence / distance from [facility]:    Advance Care Planning:   HCPOA: Wishes to have friends [name] [phone]), and [name] [phone]).  Natural surrogate decision maker: Mother  Living Will: None  ACP note: No    Objective     Opioid Risk Tool:  ?? Male  Male    Family history of substance abuse      Alcohol  1  3    Illegal drugs  2  3    Rx drugs  4  4    Personal history of substance abuse      Alcohol  3  3    Illegal drugs  4  4    Rx drugs  5  5    Age between 70???45 years  1  1    History of preadolescent sexual abuse  3  0    Psychological disease      ADD, OCD, bipolar, schizophrenia  2  2    Depression  1  1    ??  Total: Not completed today. Pt high risk by definition with previous documented cocaine use and prior prescribers refusing to continue providing pain medications due to concern for misuse/diversion (see assessment).  (<3 low risk, 4-7 moderate risk, >8 high risk)     No history exists.       Patient Active Problem List   Diagnosis   ??? Metastatic renal cell carcinoma to bone (CMS-HCC)   ??? Substance abuse (CMS-HCC)       Past Medical History:   Diagnosis Date   ??? Bone metastasis (CMS-HCC)    ??? Renal cancer (CMS-HCC)        No past surgical history on file.    Current Outpatient Medications   Medication Sig Dispense Refill   ??? albuterol (PROVENTIL HFA;VENTOLIN HFA) 90 mcg/actuation inhaler Inhale 2 puffs.     ??? aluminum-magnesium hydroxide-simethicone 200-200-20 mg/5 mL Susp 80 mL, diphenhydrAMINE 12.5 mg/5 mL Liqd 200 mg, nystatin 100,000 unit/mL Susp 8,000,000 Units, distilled water Liqd 80 mL Take 5 mL by mouth every six (6) hours as needed. Swish, gargle, spit as needed.  May be swallowed if esophageal involvement. 200 mL 0   ??? amLODIPine (NORVASC) 5 MG tablet Take 1 tablet (5 mg total) by mouth daily. 90 tablet 5   ??? cabozantinib 40 mg Tab Take 40 mg by mouth daily. 30 tablet 5   ??? gabapentin (NEURONTIN) 300 MG capsule Take 2 capsules (600 mg total) by mouth Three (3) times a day. 180 capsule 11   ??? ibuprofen (ADVIL,MOTRIN) 600 MG tablet Take 600 mg by mouth every eight (8) hours as needed for pain.     ??? lidocaine (LIDODERM) 5 % patch Place 1 patch on the skin daily. Apply to affected area for 12 hours only each day (then remove patch)     ??? MORPhine (MS CONTIN) 60 MG 12 hr tablet Take 1 tablet (60 mg total) by mouth Two (2) times a day. 14 tablet 0   ??? omeprazole (PRILOSEC) 20 MG capsule Take 20 mg by mouth daily. TAKE 1 CAPSULE (20 MG TOTAL) BY MOUTH DAILY.  6   ??? ondansetron (ZOFRAN-ODT) 4 MG disintegrating tablet Take 2 tablets (8 mg total) by mouth every eight (8) hours as needed for nausea. 60 tablet 3   ??? oxyCODONE (ROXICODONE) 10 mg immediate release tablet Take 1 tablet (10 mg total) by mouth every six (6) hours as needed for pain. 28 tablet 0   ??? predniSONE (DELTASONE) 20 MG tablet Take 3 tablets (60 mg total) by mouth daily. 42 tablet 0   ??? tiotropium (SPIRIVA WITH HANDIHALER) 18 mcg inhalation capsule Place 18 mcg into inhaler and inhale.       No current facility-administered medications for this visit.        Allergies: No Known Allergies    Family History:  Cancer-related family history is not on file.  has no family status information on file.       REVIEW OF SYSTEMS:  A comprehensive review of 14 systems was negative except for pertinent positives noted in HPI.      PHYSICAL EXAM:  Vital signs for this encounter: VS reviewed in EPIC.  GEN: Awake and alert, caucasian male sitting in chair, uncomfortable.  PSYCH: Alert and oriented to person, place and time. Euthymic.  HEENT: Pupils equally round without scleral icterus. No facial asymmetry.  LUNGS: No increased work of breathing, no audible stridor or wheeze.  ABD: Non-distended  SKIN: No rashes, petechiae or jaundice noted  EXT: No edema noted of the lower extremities  NEURO: No focal deficits noted    Lab Results   Component Value Date    CREATININE 0.64 (L) 03/16/2018     Lab Results   Component Value Date    ALKPHOS 169 (H) 03/16/2018    BILITOT 0.3 03/16/2018    PROT 6.1 (L) 03/16/2018    ALBUMIN 3.2 (L) 03/16/2018    ALT 34 03/16/2018    AST 48 03/16/2018          I personally spent over half of a total 45. minutes in counseling and discussion with the patient as described above.    Lavella Lemons, MD, HPM Fellow  [facility] Outpatient Oncology Palliative Care    I saw and evaluated the patient, participating in the key portions of the service.  I reviewed the palliative medicine fellow???s note.  I agree with the fellow???s findings and plan. Hilda Blades, MD

## 2018-03-17 NOTE — Unmapped (Signed)
Left message to confirm which pharmacy pt would like prednisone sent to.

## 2018-03-18 MED ORDER — PREDNISONE 20 MG TABLET
ORAL_TABLET | Freq: Every day | ORAL | 0 refills | 0 days | Status: SS
Start: 2018-03-18 — End: 2018-04-16

## 2018-03-18 NOTE — Unmapped (Signed)
Pt made aware that rx has been sent.

## 2018-03-18 NOTE — Unmapped (Signed)
[facility]   Patient Onboarding/Medication Counseling    Russell [name] is a 57 y.o. male with mRCC.     Medication: cabozantinib 40 mg (decreased from 60 mg)    Verified patient's date of birth / HIPAA.    Patient was previously counseled on cabozantinib.    Delivery Information    Medication Assistance provided: Prior Authorization    Anticipated copay of $3 reviewed with patient. Verified delivery address in [facility] and reviewed medication storage requirement.    Scheduled delivery date: 03/20/18    Explained that we ship using UPS or courier and this shipment will not require a signature.      Explained the services we provide at [facility] and that each month we would call to set up refills.  Stressed importance of returning phone calls so that we could ensure they receive their medications in time each month.  Informed patient that we should be setting up refills 7-10 days prior to when they will run out of medication.     Patient verbalized understanding of the above information as well as how to contact the pharmacy at [phone] option 4 with any questions/concerns.  The pharmacy is open Monday through Friday 8:30am-4:30pm.  A pharmacist is available 24/7 via pager to answer any clinical questions they may have.      Patient Specific Needs    ? Patient has no physical, cognitive, or cultural barriers.    ? Patient prefers to have medications discussed with  Patient     ? Patient is able to read and understand education materials at a high school level or above.    ? Patient's primary language is  English       Rayna Sexton  PharmD Candidate    Laverna Peace PharmD, BCOP, CPP  Hematology/Oncology Pharmacist  P: [phone]

## 2018-03-19 MED FILL — CABOMETYX/40MG/TABS: CABOMETYX/40MG/TABS | 30 days supply | Qty: 30 | Fill #0

## 2018-03-22 ENCOUNTER — Ambulatory Visit: Admit: 2018-03-22 | Discharge: 2018-03-22 | Disposition: A | Discharge status: Home / Self Care | Payer: MEDICAID

## 2018-03-22 LAB — BASIC METABOLIC PANEL
ANION GAP: 5 mmol/L — ABNORMAL LOW (ref 9–15)
BLOOD UREA NITROGEN: 19 mg/dL (ref 7–21)
BUN / CREAT RATIO: 28
CALCIUM: 8 mg/dL — ABNORMAL LOW (ref 8.5–10.2)
CO2: 27 mmol/L (ref 22.0–30.0)
CREATININE: 0.67 mg/dL — ABNORMAL LOW (ref 0.70–1.30)
EGFR MDRD AF AMER: >=60 mL/min/{1.73_m2} (ref >=60)
EGFR MDRD NON AF AMER: >=60 mL/min/{1.73_m2} (ref >=60)
GLUCOSE RANDOM: 90 mg/dL (ref 65–179)
POTASSIUM: 3.5 mmol/L (ref 3.5–5.0)
SODIUM: 137 mmol/L (ref 135–145)

## 2018-03-22 LAB — TOXICOLOGY SCREEN, URINE
AMPHETAMINE SCREEN URINE: <500
BARBITURATE SCREEN URINE: <200
BENZODIAZEPINE SCREEN, URINE: <200
CANNABINOID SCREEN URINE: <20
METHADONE SCREEN, URINE: <300

## 2018-03-22 LAB — CBC W/ AUTO DIFF
BASOPHILS ABSOLUTE COUNT: 0 10*9/L (ref 0.0–0.1)
BASOPHILS RELATIVE PERCENT: 0.2 %
EOSINOPHILS ABSOLUTE COUNT: 0.1 10*9/L (ref 0.0–0.4)
EOSINOPHILS RELATIVE PERCENT: 1.8 %
HEMATOCRIT: 32.5 % — ABNORMAL LOW (ref 41.0–53.0)
HEMOGLOBIN: 10.5 g/dL — ABNORMAL LOW (ref 13.5–17.5)
LARGE UNSTAINED CELLS: 1 % (ref 0–4)
LYMPHOCYTES RELATIVE PERCENT: 11.6 %
MEAN CORPUSCULAR HEMOGLOBIN CONC: 32.2 g/dL (ref 31.0–37.0)
MEAN CORPUSCULAR HEMOGLOBIN: 27.7 pg (ref 26.0–34.0)
MEAN CORPUSCULAR VOLUME: 86 fL (ref 80.0–100.0)
MEAN PLATELET VOLUME: 7.7 fL (ref 7.0–10.0)
MONOCYTES ABSOLUTE COUNT: 0.2 10*9/L (ref 0.2–0.8)
MONOCYTES RELATIVE PERCENT: 3.9 %
NEUTROPHILS RELATIVE PERCENT: 81.2 %
PLATELET COUNT: 224 10*9/L (ref 150–440)
RED BLOOD CELL COUNT: 3.78 10*12/L — ABNORMAL LOW (ref 4.50–5.90)
RED CELL DISTRIBUTION WIDTH: 18.4 % — ABNORMAL HIGH (ref 12.0–15.0)
WBC ADJUSTED: 4.6 10*9/L (ref 4.5–11.0)

## 2018-03-22 LAB — ANISOCYTOSIS

## 2018-03-22 LAB — GLUCOSE RANDOM: Glucose:MCnc:Pt:Ser/Plas:Qn:: 90

## 2018-03-22 LAB — AMPHETAMINE SCREEN URINE: Lab: <500

## 2018-03-22 MED ORDER — MORPHINE ER 60 MG TABLET,EXTENDED RELEASE
ORAL_TABLET | Freq: Two times a day (BID) | ORAL | 0 refills | 0 days | Status: CP
Start: 2018-03-22 — End: 2018-03-29

## 2018-03-22 NOTE — Unmapped (Signed)
Emergency Department Provider Note    ED Clinical Impression     Final diagnoses:   Metastatic renal cell carcinoma to bone (CMS-HCC) (Primary)       ED Assessment/Plan   Russell [name] is a 57 y.o. male with a PMH of metastatic renal cell carcinoma to bone (on cabozantinib s/p radiation), substance abuse and COPD who presents to the ED with persistent, uncontrolled BLE pain. On exam, patient appears to be in pain. Heart sounds normal without murmurs. Lungs CTAB. Abdomen is soft, non-tender, non-distended. Plan for Dilaudid, oxycodone in the ED.     Pt has received 1 dose of iv dilaudid without any improvement in pain.  Will give second dose.    Pt requesting admission for pain control.  D/w hospitalist.    History     Chief Complaint   Patient presents with   ??? Leg Pain     HPI   Russell [name] is a 57 y.o. male with a PMH of metastatic renal cell carcinoma to bone (on cabozantinib s/p radiation), substance abuse and COPD who presents to the ED with persistent, uncontrolled BLE pain. He states he is not out of his pain medications, taking 10 mg oxycodone BID, along with MS Contin. He states he ran out of his morphine and is able to pick up this refill in two days. He states he has been taking these medications as prescribed, but the pain is not under control. He denies fevers, chills, nausea, vomiting, chest pain.       Past Medical History:   Diagnosis Date   ??? Bone metastasis (CMS-HCC)    ??? Renal cancer (CMS-HCC)        No past surgical history on file.    No family history on file.    Social History     Socioeconomic History   ??? Marital status: Single     Spouse name: Not on file   ??? Number of children: Not on file   ??? Years of education: Not on file   ??? Highest education level: Not on file   Occupational History   ??? Not on file   Social Needs   ??? Financial resource strain: Not on file   ??? Food insecurity:     Worry: Not on file     Inability: Not on file   ??? Transportation needs:     Medical: Not on file Non-medical: Not on file   Tobacco Use   ??? Smoking status: Current Every Day Smoker     Packs/day: 1.00     Types: Cigarettes   ??? Smokeless tobacco: Never Used   Substance and Sexual Activity   ??? Alcohol use: Yes   ??? Drug use: Yes   ??? Sexual activity: Not on file   Lifestyle   ??? Physical activity:     Days per week: Not on file     Minutes per session: Not on file   ??? Stress: Not on file   Relationships   ??? Social connections:     Talks on phone: Not on file     Gets together: Not on file     Attends religious service: Not on file     Active member of club or organization: Not on file     Attends meetings of clubs or organizations: Not on file     Relationship status: Not on file   Other Topics Concern   ??? Not on file   Social History  Narrative   ??? Not on file       Review of Systems   Constitutional: Negative for chills and fever.   HENT: Negative for rhinorrhea and sore throat.    Eyes: Negative for visual disturbance.   Respiratory: Negative for shortness of breath.    Cardiovascular: Negative for chest pain.   Gastrointestinal: Negative for abdominal pain, diarrhea, nausea and vomiting.   Genitourinary: Negative for dysuria.   Musculoskeletal: Positive for myalgias (BLE pain). Negative for back pain.   Skin: Negative for rash and wound.   Neurological: Negative for weakness, numbness and headaches.   All other systems reviewed and are negative.      Physical Exam     BP 129/82  - Pulse 88  - Temp 36.4 ??C (97.5 ??F) (Oral)  - Resp 22  - SpO2 98%     Physical Exam   Constitutional: He is oriented to person, place, and time. He appears well-developed and well-nourished. No distress.   HENT:   Head: Normocephalic and atraumatic.   Nose: Nose normal.   Mouth/Throat: Oropharynx is clear and moist. No oropharyngeal exudate.   Eyes: Pupils are equal, round, and reactive to light. Conjunctivae and EOM are normal.   Neck: Normal range of motion. Neck supple. No JVD present. No tracheal deviation present. No thyromegaly present.   Cardiovascular: Normal rate, regular rhythm, normal heart sounds and intact distal pulses. Exam reveals no gallop and no friction rub.   No murmur heard.  Pulmonary/Chest: Effort normal and breath sounds normal. No stridor. No respiratory distress. He has no wheezes. He has no rales. He exhibits no tenderness.   Abdominal: Soft. Bowel sounds are normal. He exhibits no distension and no mass. There is no tenderness. There is no rebound and no guarding.   Musculoskeletal: Normal range of motion. He exhibits no edema or tenderness.   Lymphadenopathy:     He has no cervical adenopathy.   Neurological: He is alert and oriented to person, place, and time. He has normal reflexes. He displays normal reflexes. No cranial nerve deficit. He exhibits normal muscle tone. Coordination normal.   Skin: Skin is warm and dry. No rash noted. No erythema. No pallor.   Psychiatric: He has a normal mood and affect. His behavior is normal. Judgment and thought content normal.   Nursing note and vitals reviewed.      ED Course           MDM  Reviewed: previous chart, nursing note and vitals         Documentation assistance was provided by Orville Govern, Scribe on March 21, 2018 at 11:03 PM for Marisa Severin, MD.     Documentation assistance was provided by the scribe in my presence.  The documentation recorded by the scribe has been reviewed by me and accurately reflects the services I personally performed.          Desma Mcgregor, MD  03/22/18 [phone]

## 2018-03-22 NOTE — Unmapped (Signed)
Pt lying on stretcher c/o increased leg pain to 10/10.  Pt denies the ability to urinate.  MD alerted.

## 2018-03-22 NOTE — Unmapped (Signed)
MD at bedside. 

## 2018-03-22 NOTE — Unmapped (Signed)
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 *   Pressure Ulcer(s)    Active Pressure Ulcer     None                 Acute on chronic cancer-related pain: The patient's worsening pain seems to be due to having run out of his MS Contin on Friday (last dose Thursday evening). Please see HPI.  - s/p IV dilaudid 2 mg x 2 doses, oxycodone 10 mg x 2 doses, & MS Contin 60 mg po x 1 dose in the ER  - F/u urine tox screen (witnessed collection in the ER). Note, urine collection was done after patient had received narcotic pain medications here.  - Will prescribe MS Contin 60 mg po x 3 tablets to get patient through the day tomorrow and the morning on Monday until he follows up with palliative care clinic on Monday.  - I've informed the patient that we will not be giving him any additional IV narcotics in the ER or inpatient given that there is no reason why he can't take orals.  - [place]CCS[place] database was reviewed today and it was appropriate.  - Per Epic records, the patient has a signed pain medication agreement with Outpatient Palliative Care, completed on 03/17/18. Plan for patient to follow-up in palliative care clinic on Monday to receive longer term prescriptions for his pain medications as he was offered to do via MyChart per patient report.    Metastatic renal cell carcinoma: Metastatic to right lung, right femur, and hip. 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 He then had XRT to right femur/hip completed 01/2018. He is now being treated with cabozantinib started  01/08/18 along with Prednisone 60 mg daily.    ___________________________________________________________________    Reason for Consult:   Pt was seen at the request of Desma Mcgregor, MD (Emergency Medicine) in consultation for acute on chronic cancer-related pain.    HPI:  Russell [name] is a 57 y.o. man with PMH of metastatic RCC to right lung, right femur and hip diagnosed 07/2017 with chronic pain on opioid pain medication who presents to [facility] ER with acute on chronic bilateral lower extremity pain.    Per [facility] records, the patient has previously been positive for cocaine on multiple urine drug tests, has refused drug screening on multiple occasions, and has declined to be seen in pain management clinic. As a result the [facility] providers became concerned that the patient was diverting/selling his pain medication and informed him that they are no longer comfortable refilling the patient's opioid prescriptions.    The patient was recently seen on 03/17/18 at [facility]iative [facility] clinic for pain management. Per the clinic note, the patient did sign the clinic pain contract. However, when the patient was told that his urine sample collection would have to be witnessed he became upset and left clinic. The note reflects that if the patient returns to clinic and agrees to witness drug  screens, then they are willing to prescribe his medications moving forward.    When I spoke with the patient he and his wife told me that he ran out of his MS Contin on Friday (last dose was Thursday night) and his chronic pain has worsened since Friday morning. He and his wife feel that the reason his pain has worsened is because he abruptly stopped taking MS Contin on Friday.    That said, the ER physician, Dr. Norlene Campbell, heard a different story, which was that the patient had taken his MS Contin twice already today without pain relief but now has run out and is not going to get a new prescription until 2 days from now. When Dr. Norlene Campbell offered the patient MS Contin and oxycodone in the ER, he said that it wasn't helping and instead requested IV dilaudid stating that this medication is the only thing that had really helped him in the past. He received 2 doses IV dilaudid 2 mg in the ER.      Allergies:  No Known Allergies    Home Medications:   Prior to Admission medications    Medication Sig Start Date End Date Taking? Authorizing Provider   albuterol (PROVENTIL HFA;VENTOLIN HFA) 90 mcg/actuation inhaler Inhale 2 puffs. 12/25/17   Historical Provider, MD   aluminum-magnesium hydroxide-simethicone 200-200-20 mg/5 mL Susp 80 mL, diphenhydrAMINE 12.5 mg/5 mL Liqd 200 mg, nystatin 100,000 unit/mL Susp 8,000,000 Units, distilled water Liqd 80 mL Take 5 mL by mouth every six (6) hours as needed. Swish, gargle, spit as needed.  May be swallowed if esophageal involvement. 03/12/18 04/11/18  Sherryl Barters, MD   amLODIPine (NORVASC) 5 MG tablet Take 1 tablet (5 mg total) by mouth daily. 03/02/18   Jennye Moccasin, MD   cabozantinib 40 mg Tab Take 40 mg by mouth daily. 03/16/18   Jennye Moccasin, MD   gabapentin (NEURONTIN) 300 MG capsule Take 2 capsules (600 mg total) by mouth Three (3) times a day. 03/16/18   Jennye Moccasin, MD   ibuprofen (ADVIL,MOTRIN) 600 MG tablet Take 600 mg by mouth every eight (8) hours as needed for pain.    Historical Provider, MD   lidocaine (LIDODERM) 5 % patch Place 1 patch on the skin daily. Apply to affected area for 12 hours only each day (then remove patch)    Historical Provider, MD   MORPhine (MS CONTIN) 60 MG 12 hr tablet Take 1 tablet (60 mg total) by mouth Two (2) times a day. 03/16/18   Jennye Moccasin, MD   omeprazole (PRILOSEC) 20 MG capsule Take 20 mg by mouth daily. TAKE 1 CAPSULE (20 MG TOTAL) BY MOUTH DAILY. 01/01/18   Historical Provider, MD   ondansetron (ZOFRAN-ODT) 4 MG disintegrating tablet Take 2 tablets (8 mg total) by mouth every eight (8) hours as needed for nausea. 03/16/18   Jennye Moccasin, MD   oxyCODONE (ROXICODONE) 10 mg immediate release tablet Take 1 tablet (10 mg total) by mouth every six (6) hours as needed for pain. 03/16/18   Jennye Moccasin, MD   predniSONE (DELTASONE) 20 MG tablet Take 3 tablets (60 mg total) by mouth daily. 03/18/18   Jennye Moccasin, MD   tiotropium (SPIRIVA WITH HANDIHALER) 18 mcg inhalation capsule Place 18 mcg into inhaler and inhale. 01/12/18   Historical Provider, MD       Medical History:  Past Medical History:   Diagnosis Date   ??? Bone metastasis (CMS-HCC)    ???  Renal cancer (CMS-HCC)        Surgical History:  Past Surgical History:   Procedure Laterality Date   ??? FLEXIBLE BRONCHOSCOPY  07/24/2017   ??? HERNIA REPAIR         Social History:  Social History     Socioeconomic History   ??? Marital status: Single     Spouse name: Not on file   ??? Number of children: Not on file   ??? Years of education: Not on file   ??? Highest education level: Not on file   Occupational History   ??? Not on file   Social Needs   ??? Financial resource strain: Not on file   ??? Food insecurity:     Worry: Not on file     Inability: Not on file   ??? Transportation needs:     Medical: Not on file     Non-medical: Not on file   Tobacco Use   ??? Smoking status: Current Every Day Smoker     Packs/day: 1.00     Types: Cigarettes   ??? Smokeless tobacco: Never Used   Substance and Sexual Activity   ??? Alcohol use: Yes   ??? Drug use: Yes   ??? Sexual activity: Not on file   Lifestyle   ??? Physical activity:     Days per week: Not on file     Minutes per session: Not on file   ??? Stress: Not on file   Relationships   ??? Social connections:     Talks on phone: Not on file     Gets together: Not on file     Attends religious service: Not on file     Active member of club or organization: Not on file     Attends meetings of clubs or organizations: Not on file     Relationship status: Not on file   Other Topics Concern   ??? Not on file   Social History Narrative   ??? Not on file     Living situation: the patient lives with their spouse.    Family History:  Family History   Problem Relation Age of Onset   ??? Brain cancer Paternal Uncle        Review of Systems:  10 systems reviewed and are negative unless otherwise mentioned in HPI    Physical Exam:  Patient Vitals for the past 8 hrs:   BP Temp Temp src Pulse Resp SpO2   03/22/18 0023 142/75 36.5 ??C (97.7 ??F) Oral 90 22 94 %   03/21/18 2140 129/82 36.4 ??C (97.5 ??F) Oral 88 22 98 %     No intake/output data recorded.    General: Well appearing, alert, conversant, cooperative, and in no distress.  Eyes: No scleral icterus. PERRL.  ENT: Normal appearing nose. Oropharyngeal mucus membranes moist & pink.  Cardiovascular: Normal rate, regular rhythm. No murmur.  Extremities: Warm to touch. Regular 2+ radial pulses bilaterally. No LE edema.  Respiratory: Normal respiratory effort on room air. Clear to auscultation bilaterally.  Gastrointestinal: Soft, non-tender, non-distended with normoactive bowel sounds.  Neurologic: Alert and fully oriented. Normal speech and language. Moving all extremities purposefully. Normal gait.  Skin: Skin is warm, dry and intact. No rash.  Psychiatric: Mood and affect are normal. Speech and behavior are normal.      Test Results  Data Review:  I have reviewed the labs and studies from the last 24 hours.  ECG: None  Imaging: None  Time spent on counseling/coordination of care: 45 Minutes  Total time spent with patient: 1 Hour

## 2018-03-22 NOTE — Unmapped (Addendum)
MD Venetia Maxon at bedside.

## 2018-03-22 NOTE — Unmapped (Signed)
Russell [name] is a 57 y.o. male. Who presents for evaluation/ complaint of right lower leg pain. Patient states he has bone cancer and is unable to control pain.  Patient is not on chemo. Patient information was obtained from patient. History/Exam limitations: none.  Patient presented to the Emergency Department by private vehicle.    Past Medical History:   Diagnosis Date   ??? Bone metastasis (CMS-HCC)    ??? Renal cancer (CMS-HCC)

## 2018-03-22 NOTE — Unmapped (Signed)
Pain reassessment after medication administered. The patient has had no adverse reaction. Patient reports no relief.  Patient's course of pain: remains the same and states he has had no relief at all.  Patient speaks softly appears drowsy, resting with eyes closed in NAD, RR even and unlabored. . Will continue to monitor

## 2018-03-23 NOTE — Unmapped (Signed)
Patient contacted the Communication [facility] requesting to speak with his nursing team, wanting to know if Dr. Okey Dupre wanted him to schedule an appointment to see him today or if he should just come in.     Patient may be reached at [phone].    Thanks in advance,  Jodi Mourning  [facility]  [phone]

## 2018-03-23 NOTE — Unmapped (Signed)
Spoke with Awilda Metro RN - she was able to get a hold of Palliative team and who will coordinate with pt for urine sample and scripts that pt needs for his pain.

## 2018-03-23 NOTE — Unmapped (Signed)
Urine toxic screen final result has been forwarded to ordering provider,Dr. Carmelia Bake for her review and her course of action.

## 2018-03-23 NOTE — Unmapped (Signed)
Called pt to confirm 03/24/18 appt with [facility] Palliative Care clinic as per pt request along with need for observed urine sample prior to visit.     Pt went to [facility] ED over weekend due to pain and was discharged 03/22/18 with 3 tabs MS Contin 60 mg with understanding to follow up with palliative care or oncology clinic for future pain management.

## 2018-03-23 NOTE — Unmapped (Signed)
AOC Triage Note     Patient: Russell [name]     Reason for call:  pain medications    Time call returned: 0845     Phone Assessment: Patient would like to be seen to provide witnessed urine sample so he can obtain his MS Contin and oxycodone refills. He reports only having one Percocet and one MS Contin left. He was seen in the ED yesterday for pain control.      Triage Recommendations: Will contact palliative care to to see if they are able to accommodate him today.      Patient Response: Verbalized understanding     Outstanding tasks: Can patient be seen today for medication refills? Per notes patient was seen 4/16 and left refusing to give observed urine.     Patient Pharmacy has been verified and primary pharmacy has been marked as preferred

## 2018-03-24 ENCOUNTER — Ambulatory Visit
Admit: 2018-03-24 | Discharge: 2018-03-24 | Payer: MEDICAID | Attending: Geriatric Medicine | Primary: Geriatric Medicine

## 2018-03-24 DIAGNOSIS — C649 Malignant neoplasm of unspecified kidney, except renal pelvis: Secondary | ICD-10-CM

## 2018-03-24 DIAGNOSIS — F1410 Cocaine abuse, uncomplicated: Secondary | ICD-10-CM

## 2018-03-24 DIAGNOSIS — K5903 Drug induced constipation: Secondary | ICD-10-CM

## 2018-03-24 DIAGNOSIS — G893 Neoplasm related pain (acute) (chronic): Principal | ICD-10-CM

## 2018-03-24 DIAGNOSIS — Z515 Encounter for palliative care: Secondary | ICD-10-CM

## 2018-03-24 DIAGNOSIS — Z0289 Encounter for other administrative examinations: Secondary | ICD-10-CM

## 2018-03-24 DIAGNOSIS — C7951 Secondary malignant neoplasm of bone: Secondary | ICD-10-CM

## 2018-03-24 DIAGNOSIS — T402X5A Adverse effect of other opioids, initial encounter: Secondary | ICD-10-CM

## 2018-03-24 LAB — TOXICOLOGY SCREEN, URINE
AMPHETAMINE SCREEN URINE: <500
CANNABINOID SCREEN URINE: <20

## 2018-03-24 LAB — BARBITURATE SCREEN URINE: Lab: <200

## 2018-03-24 MED ORDER — OXYCODONE 10 MG TABLET
ORAL_TABLET | ORAL | 0 refills | 0 days | Status: CP | PRN
Start: 2018-03-24 — End: 2018-03-29

## 2018-03-24 MED ORDER — MORPHINE ER 60 MG TABLET,EXTENDED RELEASE
ORAL_TABLET | Freq: Three times a day (TID) | ORAL | 0 refills | 0.00000 days | Status: CP
Start: 2018-03-24 — End: 2018-03-29

## 2018-03-24 MED ORDER — NALOXONE 4 MG/ACTUATION NASAL SPRAY
0 refills | 0.00000 days | Status: CP
Start: 2018-03-24 — End: 2018-03-24

## 2018-03-24 MED ORDER — NALOXONE 4 MG/ACTUATION NASAL SPRAY: each | 0 refills | 0 days

## 2018-03-24 MED FILL — NARCAN (EACH)/4MG/0.1ML/SPRY: NARCAN (EACH)/4MG/0.1ML/SPRY | 10 days supply | Qty: 2 | Fill #0

## 2018-03-24 MED FILL — OXYCODONE HCL/10MG/TABS: OXYCODONE HCL/10MG/TABS | 7 days supply | Qty: 42 | Fill #0

## 2018-03-24 MED FILL — MORPHINE/60MG ER/TAB: MORPHINE/60MG ER/TAB | 7 days supply | Qty: 21 | Fill #0

## 2018-03-24 NOTE — Unmapped (Signed)
OUTPATIENT ONCOLOGY PALLIATIVE CARE    Principal Diagnosis: Russell [name] is a 57 y.o. 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 He then had XRT to right femur/hip completed 01/2018. He is now being treated with cabozantinib started  01/08/18 along with Prednisone 60mg  daily.    Assessment/Plan:    1. Pain, cancer related in R hip and femur at site of metastasis    Plan:  -- Increase MS Contin 60mg  from BID to TID (1 week script)  -- Continue Oxycodone 10mg  Q4hrs prn (1 week script)  -- received narcan in er - received teaching today re use    -- clarify gabapentin dose - plan for 300 mg tid.  -- Continue Prednisone 60mg  (continue Omeprazole for GI ppx) - per oncology - concern for pulmonary side effects from tx.  -- Continue Ibuprofen prn     2.  Cocaine abuse: longstanding - discussed that open communication is essential.  He expresses interest in stopping use and dealing with stressors.  Has consistently had urine + for cocaine.    Plan:  -- CCSP/psychiatry referral  -- continue urine tox monitoring - for substance use and to confirm appropriate opioid use      2. Constipation: opioid associated    Plan:  -- Senna 2 tabs BID  -- Miralax BID  -- adjust as indicated for BM every other day at minimum      3. Controlled substances risk management.   - Patient has a signed pain medication agreement with Outpatient Palliative Care, completed on 03/17/18, as per standard of care.   - NCCSRS database was reviewed today and it was appropriate.   - Urine drug screen was performed at this visit. Findings: pending.   - Patient has received information about safe storage and administration of medications.   - Patient has received a prescription for narcan.       F/u: if pt willing to undergo witnessed urine sample collection for drug screen, happy to see pt back and assume pain management role.    ----------------------------------------  Referring Provider: Salem Caster MD  Oncology Team: [facility]  PCP: Ihor Austin, MD      HPI: Russell [name] is a 57 y.o. 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 He then had XRT to right femur/hip completed 01/2018. He is now being treated with cabozantinib started  01/08/18 along with Prednisone 60mg  daily.    Today Russell [name] shares that he is aware his cancer has spread throughout his body, but is hoping his current chemotherapy will get rid of this stuff. He does want his providers to be direct with him about his prognosis, however, and while he shares he does not enjoy talking about his cancer, would want his providers to be straight-forward and honest about his prognosis should his cancer fail to respond to current therapies. In terms of symptoms, Russell [name] shares his pain is worst in his  right thigh and hip, feels like someone stabbing me with an ice pick, is 10/10 (NRS) at its worst, and 2/10 after taking 10mg  Oxycodone which he reports using three times a day. Pt shares that Ibuprofen and lidocaine patches are also helpful in alleviating pain somewhat, though not as effective as Oxycodone. He often wakes at night with pain. He shares he previously had poor appetite, but this has improved on increasing his Prednisone dose from 20 to 60mg  a day. He otherwise denies significant symptoms, other than mild nausea after taking his medications in the morning that is resolved with Zofran. He denies HA, SOB, chest pain, abdominal pain, persistent nausea, constipation, depression, anxiety, or any other symptoms.    Regarding advance care planning, Russell [name] shares he has not thought much about what kind of interventions he would or would not want were he to become seriously ill, though he does want his HCPOA to be his friends [name] and [name], not any of his immediate family members.    Interval history, 03/24/18, GW: follow up - friend Herbert Seta was also present  -- went to the ER 2/2 running out of MS contin and oxycodone - received IV dilaudid and given 3 tablets of ms contin - last dose this am.  Reports 8 out of 10 pain in right leg - behind knee and in hip.  He took extra ms contin tablets and up to 5 doses of oxycodone/day - more than what was prescribed.  Pain level has been as low as 6 in last 24 hours - 8 is the highest level.  He also reports taking gabapentin 3 tablets three times day - which may be 900 tid - he didn't bring in pill bottles. Also, he is taking prednisone 60 mg qd.  He is also using OTC lidoderm patches.  -- patient reports using cocaine powder 3-4x/week for years (predating his cancer dx).  He denies other substance use, including MJ.  He drank 1/2 beer yesterday - etoh use limited by mouth sores from chemo - denies having etoh use disorder.  -- denies giving or selling his pain medications.  -- states not knowing next steps in cancer treatment - states understanding of having stage 4 cancer from dx, and that this means that he will die soon.  -- hasn't had a BM in 4 days -- not taking any laxatives    Palliative Performance Scale: 70% - Ambulation: Reduced / unable to do normal work, some evidence of disease / Self-Care: Full / Intake: Normal or reduced / Level of Conscious: Full    Coping/Support Issues: Issues with previously documented cocaine use.    Goals of Care: Pt currently focused on symptom relief, though wishes to continue any available cancer-directed treatments. 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  Hobbies: Previously played softball, likes to shoot pool at bars with his mom.  Current residence / distance from [facility]: lives in [place]    Advance Care Planning:   HCPOA: Wishes to have friends [name] [phone]), and [name] [phone]).  Natural surrogate decision maker: Mother  Living Will: None  ACP note: No    Objective     Opioid Risk Tool:  ?? Male  Male    Family history of substance abuse      Alcohol  1  3    Illegal drugs  2  3    Rx drugs  4  4    Personal history of substance abuse      Alcohol  3  3    Illegal drugs  4  4    Rx drugs  5  5    Age between 21???45 years  1  1    History of preadolescent sexual abuse  3  0    Psychological disease      ADD, OCD, bipolar, schizophrenia  2  2    Depression  1  1    ??  Total: Not completed today. Pt high risk by definition with previous documented cocaine use and prior prescribers refusing to continue providing pain medications due to concern for misuse/diversion (see assessment).  (<3 low risk, 4-7 moderate risk, >8 high risk)     No history exists.       Patient Active Problem List   Diagnosis   ??? Metastatic renal cell carcinoma to bone (CMS-HCC)   ??? Substance abuse (CMS-HCC)   ??? Cancer related pain       Past Medical History:   Diagnosis Date   ??? Bone metastasis (CMS-HCC)    ??? Renal cancer (CMS-HCC)        Past Surgical History:   Procedure Laterality Date   ??? FLEXIBLE BRONCHOSCOPY  07/24/2017   ??? HERNIA REPAIR         Current Outpatient Medications   Medication Sig Dispense Refill   ??? albuterol (PROVENTIL HFA;VENTOLIN HFA) 90 mcg/actuation inhaler Inhale 2 puffs.     ??? aluminum-magnesium hydroxide-simethicone 200-200-20 mg/5 mL Susp 80 mL, diphenhydrAMINE 12.5 mg/5 mL Liqd 200 mg, nystatin 100,000 unit/mL Susp 8,000,000 Units, distilled water Liqd 80 mL Take 5 mL by mouth every six (6) hours as needed. Swish, gargle, spit as needed.  May be swallowed if esophageal involvement. 200 mL 0   ??? amLODIPine (NORVASC) 5 MG tablet Take 1 tablet (5 mg total) by mouth daily. 90 tablet 5   ??? cabozantinib 40 mg Tab Take 40 mg by mouth daily. 30 tablet 5   ??? gabapentin (NEURONTIN) 300 MG capsule Take 2 capsules (600 mg total) by mouth Three (3) times a day. 180 capsule 11   ??? ibuprofen (ADVIL,MOTRIN) 600 MG tablet Take 600 mg by mouth every eight (8) hours as needed for pain.     ??? lidocaine (LIDODERM) 5 % patch Place 1 patch on the skin daily. Apply to affected area for 12 hours only each day (then remove patch)     ??? lidocaine-diphenhydramine-aluminum-magnesium (FIRST-MOUTHWASH BLM) 200-25-400-40 mg/30 mL Mwsh SWISH, GARGLE & SPIT BY MOUTH 1 TEASPOONFUL EVERY 6 HOURS AS NEEDED. MAY BE SWALLOWED IF ESOPHAGEAL INVOLVEMENT  0   ??? MORPhine (MS CONTIN) 60 MG 12 hr tablet Take 1 tablet (60 mg total) by mouth Two (2) times a day. for 3 doses 3 tablet 0   ??? MORPhine (MS CONTIN) 60 MG 12 hr tablet Take 1 tablet (60 mg total) by mouth three (3) times a day. 21 tablet 0   ??? omeprazole (PRILOSEC) 20 MG capsule Take 20 mg by mouth daily. TAKE 1 CAPSULE (20 MG TOTAL) BY MOUTH DAILY.  6   ??? ondansetron (ZOFRAN-ODT) 4 MG disintegrating tablet Take 2 tablets (8 mg total) by mouth every eight (8) hours as needed for nausea. 60 tablet 3   ??? oxyCODONE (ROXICODONE) 10 mg immediate release tablet Take 1 tablet (10 mg total) by mouth every four (4) hours as needed  for pain. 42 tablet 0   ??? predniSONE (DELTASONE) 20 MG tablet Take 3 tablets (60 mg total) by mouth daily. 63 tablet 0   ??? tiotropium (SPIRIVA WITH HANDIHALER) 18 mcg inhalation capsule Place 18 mcg into inhaler and inhale.     ??? naloxone (NARCAN) 4 mg nasal spray One spray in either nostril once for known/suspected opioid overdose. May repeat every 2-3 minutes in alternating nostril til EMS arrives 1 each 0     No current facility-administered medications for this visit.        Allergies: No Known Allergies    Family History:  Cancer-related family history includes Brain cancer in his paternal uncle.  indicated that the status of his paternal uncle is unknown.      REVIEW OF SYSTEMS:  A comprehensive review of 14 systems was negative except for pertinent positives noted in HPI.      PHYSICAL EXAM:   Vital signs for this encounter: VS reviewed in EPIC.  GEN: Awake and alert, caucasian male sitting in chair, uncomfortable.  PSYCH: Alert and oriented to person, place and time. Euthymic.  HEENT: Pupils equally round without scleral icterus. No facial asymmetry.  LUNGS: No increased work of breathing, no audible stridor or wheeze.  ABD: Non-distended  SKIN: No rashes, petechiae or jaundice noted  EXT: No edema noted of the lower extremities  NEURO: No focal deficits noted    Lab Results   Component Value Date    CREATININE 0.67 (L) 03/22/2018     Lab Results   Component Value Date    ALKPHOS 169 (H) 03/16/2018    BILITOT 0.3 03/16/2018    PROT 6.1 (L) 03/16/2018    ALBUMIN 3.2 (L) 03/16/2018    ALT 34 03/16/2018    AST 48 03/16/2018        I personally spent over half of a total 30. minutes in counseling and discussion with the patient as described above.    Doreatha Martin, MD  [facility] Outpatient Oncology Palliative Care

## 2018-03-24 NOTE — Unmapped (Signed)
-----   Message from Belva Bertin, RN sent at 03/23/2018  5:28 PM EDT -----  Yes he will see Jillyn Hidden tomorrow at 9 am and provide a sample thx  [place]  ----- Message -----  From: Elijah Birk, RN  Sent: 03/23/2018   1:08 PM  To: Belva Bertin, RN    Boneta Lucks,    I am in clinic if you need anything. I spoke with Awilda Metro and she said that you will coordinate to get urine sample and scripts for pt.    Let me know if you need anything on my end.    Monica Martinez

## 2018-03-24 NOTE — Unmapped (Signed)
--   INCREASE MS Contin 60 mg three times a day  -- INCREASE oxycodone 10 mg every four hours as needed    -- You are receiving a narcan prescription in event of sedation from pain medication    For constipation  -- take miralax twice a day  -- take senna 2 tablets twice a day    Here is some additional information about [facility] Outpatient Oncology Palliative Care:  ??  What is Palliative Care?  Palliative Care is medical care that focuses on managing symptoms of cancer or side effects from cancer treatment. The Outpatient Oncology Palliative Care service uses a team approach to help patients and their caregivers with the goal of maintaining the best quality of life possible during a cancer diagnosis and cancer treatment.   The Outpatient Oncology Palliative Care team serves all outpatient oncology clinics including surgery, gynecology, medicine and radiation oncology.   ??  Who provides Outpatient Palliative Care?  The Outpatient Oncology Palliative Care team includes doctors, nurses, pharmacists, social workers, counselors and nutritionists. The team works with a patient's primary oncology team to create care plans that can help manage symptoms related to cancer and cancer treatment.  ??  What services are offered by Outpatient Oncology Palliative Care?  We can help cancer patients with:  ?? Pain  ?? Symptoms such as nausea, vomiting, anxiety, depression, fatigue, loss of appetite of shortness of breath  ?? Emotional, psychological, or spiritual suffering  ?? Nutritional problems  ?? Concern about medical decisions  ?? Difficulty communicating values, goals and personal choices.  ??  Who are the Outpatient Oncology Palliative Care team members?  Physicians: Doreatha Martin, MD and  Kathlynn Grate, MD  Physician Fellows:  Tera Partridge, MD; Franchot Mimes, MD; Vertell Limber, MD  Clinical Pharmacist Practitioner:  Laurene Footman, PharmD, CPP  Nurse Practitioner: Griselda Miner, RN, MSN, FNP-BC,  Nurse Clinical Coordinator: Allegra Lai, RN, BSN, MS, OCN  Administrative Specialist: Dolores Frame  ??  When can I see the Outpatient Oncology Palliative Care team and how do I make contact in between visits?  We see patients Monday through Friday 8am - 4:30pm. If you have questions or concerns during clinic hours, please contact us. Any requests made outside of business hours will be addressed the following business day.   ??  For appointments & questions Monday through Friday 8 AM??? 4:30 PM:  please call [phone] or Toll free [phone].  ??  On Nights, Weekends and Holidays:  Call [phone] and ask for the Oncologist on call.

## 2018-03-27 NOTE — Unmapped (Addendum)
Spoke to pt for pain check in. States that his pain is well controlled and things are going well. No issues with picking up medications.    Decreased gabapentin 600 TID starting Tuesday 4/23, plans to decrease to 300 TID next Tuesday as well.     Requested that next set of prescriptions be sent to [facility] in [place].     Will be due for next prescription on Tuesday 03/31/17.    [facility] will plan to see in 4 weeks for return and provide weekly scripts. Reminded pt that has [facility] psych appt needs to attend 04/01/18 with Amy Ala Dach.

## 2018-03-29 MED ORDER — OXYCODONE 10 MG TABLET
ORAL_TABLET | ORAL | 0 refills | 0 days | Status: CP | PRN
Start: 2018-03-29 — End: 2018-04-07

## 2018-03-29 MED ORDER — MORPHINE ER 60 MG TABLET,EXTENDED RELEASE
ORAL_TABLET | Freq: Three times a day (TID) | ORAL | 0 refills | 0 days | Status: CP
Start: 2018-03-29 — End: 2018-04-06

## 2018-03-31 LAB — OPIATE, URINE, QUANTITATIVE
6-MONOACETYLMRPH: <20 ng/mL
BUPRENORPHINE: <5 ng/mL
HYDROCODONE GC/MS CONF: 106 ng/mL
HYDROMORPHONE GC/MS CONF: 430 ng/mL
OPIATE INTERP: POSITIVE
OXYCODONE (GC/MS): 1348 ng/mL
OXYMORPHONE: 345 ng/mL

## 2018-03-31 LAB — OXYMORPHONE: Lab: 345

## 2018-03-31 MED ORDER — IBUPROFEN 600 MG TABLET
ORAL_TABLET | Freq: Three times a day (TID) | ORAL | 2 refills | 0 days | Status: CP | PRN
Start: 2018-03-31 — End: 2018-04-01

## 2018-03-31 NOTE — Unmapped (Signed)
Addended by: Doreatha Martin on: 03/30/2018 05:31 PM     Modules accepted: Level of Service

## 2018-03-31 NOTE — Unmapped (Signed)
Left message to notify pt of appt changes.

## 2018-04-01 ENCOUNTER — Ambulatory Visit: Admit: 2018-04-01 | Discharge: 2018-04-01 | Disposition: A | Discharge status: Home / Self Care | Payer: MEDICAID

## 2018-04-01 MED ORDER — IBUPROFEN 600 MG TABLET
ORAL_TABLET | Freq: Three times a day (TID) | ORAL | 2 refills | 0 days | Status: SS | PRN
Start: 2018-04-01 — End: 2018-05-06

## 2018-04-01 NOTE — Unmapped (Signed)
[facility]  Emergency Department Provider Note      ED Clinical Impression     Final diagnoses:   Left leg pain (Primary)       Initial Impression, ED Course, Assessment and Plan     Impression: Chronic left leg pain in the setting of metastatic cancer    57 yo male with PMHx of RCC and known mets to L femur who presents with left leg pain.    BP 155/94  - Pulse 93  - Temp 36.6 ??C (97.9 ??F) (Oral)  - Resp 16  - SpO2 94%     On exam, no joint effusions or erythma. FROM of L hip and knee. Patient able to bear weight. 2+ and symmetric DP pulses, cap refill < 2 sec.    At this time, I am most concerned for exacerbation of his same chronic pain. Low concern for trauma or pathologic fracture, septic joint, vascular claudication.     He sees palliative care and MS Contin was recently increased to TID. Patient took morning dose of medication but not midday dose. Will provide him with midday dose of pain oral meds (MS contin, oxycodon, gabapentin) in addition to tylenol and ibuprofen, and discharge home. His wife already called palliative care to move up next appointment.     Patient reports pain is improved. Denies other symptoms at this time. He is in agreement with the plan and is hemodynamically stable. Safe for discharge home with Palliative care follow up.        Additional Medical Decision Making     I have reviewed the vital signs and the nursing notes. Labs and radiology results that were available during my care of the patient were independently reviewed by me and considered in my medical decision making.     I staffed the case with the ED attending, Dr. Magdalene Molly.    Portions of this record have been created using Scientist, clinical (histocompatibility and immunogenetics). Dictation errors have been sought, but may not have been identified and corrected.  ____________________________________________       History     Chief Complaint  Pain      HPI   Russell [name] is a 58 y.o. 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 Patient reports that his pain has been progressively worsening and is currently a 10/10 in intensity. He states that he has been able to weight bear with extreme pain. No swelling. Patient is not currently diaphoretic. Patient denies chest pain, shortness of breath, or nausea currently. He states he currently takes 60 mg MS Contin TID and 10 mg oxycodone every four hours for his pain, as well as Gabapentin 300mg  TID. He has taken one does of morphine and one does of oxycodone so far today with little relief. Patient denies recent surgeries.    Past Medical History:   Diagnosis Date   ??? Bone metastasis (CMS-HCC)    ??? Renal cancer (CMS-HCC)        Patient Active Problem List   Diagnosis   ??? Metastatic renal cell carcinoma to bone (CMS-HCC)   ??? Substance abuse (CMS-HCC)   ??? Cancer related pain       Past Surgical History:   Procedure Laterality Date   ??? FLEXIBLE BRONCHOSCOPY  07/24/2017   ??? HERNIA REPAIR  No current facility-administered medications for this encounter.     Current Outpatient Medications:   ???  albuterol (PROVENTIL HFA;VENTOLIN HFA) 90 mcg/actuation inhaler, Inhale 2 puffs., Disp: , Rfl:   ???  aluminum-magnesium hydroxide-simethicone 200-200-20 mg/5 mL Susp 80 mL, diphenhydrAMINE 12.5 mg/5 mL Liqd 200 mg, nystatin 100,000 unit/mL Susp 8,000,000 Units, distilled water Liqd 80 mL, Take 5 mL by mouth every six (6) hours as needed. Swish, gargle, spit as needed.  May be swallowed if esophageal involvement., Disp: 200 mL, Rfl: 0  ???  amLODIPine (NORVASC) 5 MG tablet, Take 1 tablet (5 mg total) by mouth daily., Disp: 90 tablet, Rfl: 5  ???  cabozantinib 40 mg Tab, Take 40 mg by mouth daily., Disp: 30 tablet, Rfl: 5  ???  gabapentin (NEURONTIN) 300 MG capsule, Take 2 capsules (600 mg total) by mouth Three (3) times a day., Disp: 180 capsule, Rfl: 11  ???  ibuprofen (ADVIL,MOTRIN) 600 MG tablet, Take 1 tablet (600 mg total) by mouth every eight (8) hours as needed for pain., Disp: 60 tablet, Rfl: 2  ???  lidocaine (LIDODERM) 5 % patch, Place 1 patch on the skin daily. Apply to affected area for 12 hours only each day (then remove patch), Disp: , Rfl:   ???  lidocaine-diphenhydramine-aluminum-magnesium (FIRST-MOUTHWASH BLM) 200-25-400-40 mg/30 mL Mwsh, SWISH, GARGLE & SPIT BY MOUTH 1 TEASPOONFUL EVERY 6 HOURS AS NEEDED. MAY BE SWALLOWED IF ESOPHAGEAL INVOLVEMENT, Disp: , Rfl: 0  ???  MORPhine (MS CONTIN) 60 MG 12 hr tablet, Take 1 tablet (60 mg total) by mouth three (3) times a day., Disp: 21 tablet, Rfl: 0  ???  naloxone (NARCAN) 4 mg nasal spray, One spray in either nostril once for known/suspected opioid overdose. May repeat every 2-3 minutes in alternating nostril til EMS arrives, Disp: 1 each, Rfl: 0  ???  omeprazole (PRILOSEC) 20 MG capsule, Take 20 mg by mouth daily. TAKE 1 CAPSULE (20 MG TOTAL) BY MOUTH DAILY., Disp: , Rfl: 6  ???  ondansetron (ZOFRAN-ODT) 4 MG disintegrating tablet, Take 2 tablets (8 mg total) by mouth every eight (8) hours as needed for nausea., Disp: 60 tablet, Rfl: 3  ???  oxyCODONE (ROXICODONE) 10 mg immediate release tablet, Take 1 tablet (10 mg total) by mouth every four (4) hours as needed for pain., Disp: 42 tablet, Rfl: 0  ???  predniSONE (DELTASONE) 20 MG tablet, Take 3 tablets (60 mg total) by mouth daily., Disp: 63 tablet, Rfl: 0  ???  tiotropium (SPIRIVA WITH HANDIHALER) 18 mcg inhalation capsule, Place 18 mcg into inhaler and inhale., Disp: , Rfl:     Allergies  Patient has no known allergies.    Family History   Problem Relation Age of Onset   ??? Brain cancer Paternal Uncle        Social History  Social History     Tobacco Use   ??? Smoking status: Current Every Day Smoker     Packs/day: 1.00     Types: Cigarettes   ??? Smokeless tobacco: Never Used   Substance Use Topics   ??? Alcohol use: Yes   ??? Drug use: Yes       Review of Systems:  All other systems have been reviewed and are negative except as otherwise documented in the HPI.      Physical Exam     ED Triage Vitals [04/01/18 1332]   Enc Vitals Group      BP 170/96      Heart Rate 106  SpO2 Pulse       Resp 14      Temp 37.1 ??C (98.8 ??F)      Temp Source Oral      SpO2 95 %       Constitutional: In no acute distress.  Eyes: Conjunctivae are normal.  HEENT:       Head: Normocephalic and atraumatic.       Nose: No epistaxis or congestion.       Mouth/Throat: Clear oropharynx. Moist mucous membranes. No stertor.       Neck: full range of motion, no stridor.  Cardiovascular: Regular rate and rhythm. No murmurs, rubs or gallops. 2+ and symmetric radial pulses.  Respiratory: No increased work of breathing. Lungs CTAB with no wheezing or crackles.  Gastrointestinal: Soft, non-tender, non-distended.  Musculoskeletal: Full ROM of right and left hip and knee. Trace pitting edema to bilateral lower extremities that is symmetrical with no calf tenderness. Good distal pulses. Tender to palpation of left femur without any joint effusion. No ecchymosis or cyanosis.  Neurologic: Normal speech and language. No gross focal neurologic deficits are appreciated.  Skin: Skin is warm, dry and intact. No acute rash noted.  Psychiatric: Mood and affect are normal. Speech and behavior are normal.      Documentation assistance was provided by Pamala Duffel, Scribe, on Apr 01, 2018 at 3:26 PM for Delma Post, MD.     Documentation assistance was provided by the scribe in my presence.  The documentation recorded by the scribe has been reviewed by me and accurately reflects the services I personally performed.         Robbi Garter, MD  Resident  04/03/18 [phone]

## 2018-04-01 NOTE — Unmapped (Signed)
Patient sts he is ready to go home, referred to Dr. Magdalene Molly

## 2018-04-01 NOTE — Unmapped (Signed)
Pt complains of pain in his legs and hips that is chronic in nature but has gotten worse. Pt has kidney CA with mets to the bone and states his pain medications are not working.

## 2018-04-02 NOTE — Unmapped (Signed)
Spoke to pt, went to [facility] yesterday for pain increase, was d/c around 1700 04/01/18. Pt slept through my pan medicine-slept 12 hrs and woke up having bad pain. He is feeling better now and pain is controlled. No changes made via ED. Plans to attend 5/9 [facility] and [facility] appts.  [facility] writes WEEKLY pain med scripts as part of POC d/t hx of SA.    Request to pick up pain med scripts on Monday 5/6 as he had picked up last pain meds on Monday 03/30/18..     RN called pharmacy to verify fill dates of last Rx as specific instructions to fill ON/AFTER Tuesday 03/31/18. Scripts e-scribed by Dr Legrand Como on Sunday 4/28, but pt reported his pharmacy informed him he could pick up script on Monday 4/29-one day early. He picked up both MS Contin and oxycodone on Monday 03/30/18 at 5 pm    RN spoke with [facility] staff to request note be added to his pharmacy profile to be attentive to specific fill dates for pain medications. They will add.    RN request pt to call [facility] with pill count for both meds, pt/caregiver [name])  agreed to do later today, was driving.

## 2018-04-02 NOTE — Unmapped (Signed)
Pt alert and in no acute distress upon discharge. Disharge education provided by RN, pt verbalized understanding.  Pt released to family.

## 2018-04-06 ENCOUNTER — Ambulatory Visit: Admit: 2018-04-06 | Discharge: 2018-04-07 | Payer: MEDICAID | Attending: Internal Medicine | Primary: Internal Medicine

## 2018-04-06 ENCOUNTER — Ambulatory Visit
Admit: 2018-04-06 | Discharge: 2018-04-06 | Disposition: A | Discharge status: Home / Self Care | Payer: MEDICAID | Attending: Emergency Medicine

## 2018-04-06 ENCOUNTER — Ambulatory Visit: Admit: 2018-04-06 | Discharge: 2018-04-07 | Payer: MEDICAID

## 2018-04-06 DIAGNOSIS — C649 Malignant neoplasm of unspecified kidney, except renal pelvis: Secondary | ICD-10-CM

## 2018-04-06 DIAGNOSIS — C7951 Secondary malignant neoplasm of bone: Principal | ICD-10-CM

## 2018-04-06 LAB — CBC W/ AUTO DIFF
BASOPHILS RELATIVE PERCENT: 0.3 %
EOSINOPHILS ABSOLUTE COUNT: 0 10*9/L (ref 0.0–0.4)
EOSINOPHILS RELATIVE PERCENT: 0.3 %
HEMATOCRIT: 35.2 % — ABNORMAL LOW (ref 41.0–53.0)
HEMOGLOBIN: 11.2 g/dL — ABNORMAL LOW (ref 13.5–17.5)
LYMPHOCYTES ABSOLUTE COUNT: 0.6 10*9/L — ABNORMAL LOW (ref 1.5–5.0)
LYMPHOCYTES RELATIVE PERCENT: 8.9 %
MEAN CORPUSCULAR HEMOGLOBIN CONC: 31.9 g/dL (ref 31.0–37.0)
MEAN CORPUSCULAR HEMOGLOBIN: 27.1 pg (ref 26.0–34.0)
MEAN CORPUSCULAR VOLUME: 84.8 fL (ref 80.0–100.0)
MEAN PLATELET VOLUME: 7.4 fL (ref 7.0–10.0)
MONOCYTES ABSOLUTE COUNT: 0.2 10*9/L (ref 0.2–0.8)
NEUTROPHILS ABSOLUTE COUNT: 5.8 10*9/L (ref 2.0–7.5)
NEUTROPHILS RELATIVE PERCENT: 86.6 %
NUCLEATED RED BLOOD CELLS: 1 /100{WBCs} (ref <=4)
PLATELET COUNT: 318 10*9/L (ref 150–440)
RED BLOOD CELL COUNT: 4.15 10*12/L — ABNORMAL LOW (ref 4.50–5.90)
RED CELL DISTRIBUTION WIDTH: 18.3 % — ABNORMAL HIGH (ref 12.0–15.0)
WBC ADJUSTED: 6.7 10*9/L (ref 4.5–11.0)

## 2018-04-06 LAB — BASIC METABOLIC PANEL
BLOOD UREA NITROGEN: 17 mg/dL (ref 7–21)
BUN / CREAT RATIO: 30
CALCIUM: 8.5 mg/dL (ref 8.5–10.2)
CHLORIDE: 104 mmol/L (ref 98–107)
CO2: 29 mmol/L (ref 22.0–30.0)
CREATININE: 0.57 mg/dL — ABNORMAL LOW (ref 0.70–1.30)
EGFR MDRD AF AMER: >=60 mL/min/{1.73_m2} (ref >=60)
EGFR MDRD NON AF AMER: >=60 mL/min/{1.73_m2} (ref >=60)
GLUCOSE RANDOM: 98 mg/dL (ref 65–179)
POTASSIUM: 3.6 mmol/L (ref 3.5–5.0)
SODIUM: 141 mmol/L (ref 135–145)

## 2018-04-06 LAB — SLIDE REVIEW

## 2018-04-06 LAB — HEMATOCRIT: Lab: 35.2 — ABNORMAL LOW

## 2018-04-06 LAB — ANION GAP: Anion gap 3:SCnc:Pt:Ser/Plas:Qn:: 8 — ABNORMAL LOW

## 2018-04-06 LAB — POLYCHROMASIA

## 2018-04-06 MED ORDER — MORPHINE ER 30 MG TABLET,EXTENDED RELEASE: 60 mg | tablet | Freq: Two times a day (BID) | 0 refills | 0 days | Status: AC

## 2018-04-06 MED ORDER — OXYCODONE-ACETAMINOPHEN 10 MG-325 MG TABLET: 1 | tablet | Freq: Four times a day (QID) | 0 refills | 0 days | Status: AC

## 2018-04-06 MED ORDER — MORPHINE ER 30 MG TABLET,EXTENDED RELEASE
ORAL_TABLET | Freq: Two times a day (BID) | ORAL | 0 refills | 0.00000 days | Status: CP
Start: 2018-04-06 — End: 2018-04-07

## 2018-04-06 MED ORDER — OXYCODONE-ACETAMINOPHEN 10 MG-325 MG TABLET
ORAL_TABLET | Freq: Four times a day (QID) | ORAL | 0 refills | 0.00000 days | Status: CP | PRN
Start: 2018-04-06 — End: 2018-04-06

## 2018-04-06 NOTE — Unmapped (Signed)
Called pt regarding his mychart message about his pain. Pt was just in the [facility] ER for his pain and got prescription to hold him until his next appointment with palliative team. Pt has CT scheduled today and palliative team appointment on 5/7.

## 2018-04-06 NOTE — Unmapped (Signed)
Pt with bone metastasis on palliative care at [facility].  Reports pain all over.  Out of his pain medication since last night.  Appears uncomfortable.

## 2018-04-06 NOTE — Unmapped (Signed)
Patient rounds completed. The following patient needs were addressed:  Pain w/ intervention, Plan of Care, Call Bell in Re[place], Bed Position Low with wheels locked. Pt resting in bed, NAD. Family at bedside. No complaints or needs at the moment.

## 2018-04-06 NOTE — Unmapped (Signed)
[facility]  Emergency Department Provider Note       ED Clinical Impression     Final diagnoses:   Chronic pain not due to malignancy (Primary)        Impression, ED Course, Assessment and Plan     Impression: Patient is a 57 y.o. male with PMH of renal cancer with bone metastasis and COPD (on palliative care) presenting to the ED for bilateral hip pain that radiates down his bilateral legs in the context of running out of his pain medication this morning. States that the pain is exactly like his malignancy assoc pain.      On exam, the patient appears uncomfortable and is in NAD. VS are WNL. ttp b/l thighs without skin changes, NVI distally. SILT. ROMI although limited to pain. Mild end-exp wheeze b/l which he states is also chronic    Differential includes most likely malig assoc pain, low susp for patho fracture, low susp for septic jt (no f/c, polyjt/bone involvement), low susp for avasc necrosis, no hx or e/o trauma.    Plan for basic labs and pain control.     10:23 AM pt pain improved. Labs without changes. Shared decision making preformed with patient. At this point, patient would like to defer inpt pain control. elects to follow up closely with their oncologist tomorrow. Will give patient RX for 1 day of his pain medication until he sees his oncologist tomorrow. Strict return precautions discussed    I have reviewed the recent controlled substance prescription history available for this patient in the Prescription Drug Monitoring Program. The patient is being prescribed a brief course (no more than a 5 day supply) of a opioid medication for an indication of acute pain.  Usual and appropriate instructions regarding the safe use of this type of medication has been provided.    Additional Medical Decision Making     I independently visualized the radiology images-prior imaging demonstrating his malignancy/mets.   I reviewed the patient's prior medical records.     Labs and radiology results that were available during my care of the patient were independently reviewed by me and considered in my medical decision making.    Portions of this record have been created using Scientist, clinical (histocompatibility and immunogenetics). Dictation errors have been sought, but may not have been identified and corrected.  ____________________________________________    Time seen: Apr 06, 2018 8:19 AM     History     Chief Complaint  Pain      HPI   Russell [name] is a 57 y.o. male with PMH of renal cancer with bone metastasis and COPD (on palliative care) presenting to the ED for pain. Patient reports bilateral hip pain that radiates down his bilateral legs. His last dose of pain medication was last night around dinner. He states this feels similar to his cancer pain in the past, although it typically is on his left side and is now worse on the right side. No trauma or injury. He typically takes morphine, oxycodone, and percocet although he ran out yesterday and had his last dose last night around dinner time. He was due to receive another dose at midnight last night. Patient is scheduled to have scans this evening and has a doctors appointment tomorrow for medication refills. Patient denies fevers, chills, numbness, weakness, abdominal pain. No b/b retention or incontinence. No abd pain. No cp or sob    Past Medical History:   Diagnosis Date   ??? Bone  metastasis (CMS-HCC)    ??? Renal cancer (CMS-HCC)        Past Surgical History:   Procedure Laterality Date   ??? FLEXIBLE BRONCHOSCOPY  07/24/2017   ??? HERNIA REPAIR           Current Facility-Administered Medications:   ???  HYDROmorphone (PF) (DILAUDID) injection 1 mg, 1 mg, Intravenous, Once, Berkley Harvey, MD    Current Outpatient Medications:   ???  albuterol (PROVENTIL HFA;VENTOLIN HFA) 90 mcg/actuation inhaler, Inhale 2 puffs., Disp: , Rfl:   ???  aluminum-magnesium hydroxide-simethicone 200-200-20 mg/5 mL Susp 80 mL, diphenhydrAMINE 12.5 mg/5 mL Liqd 200 mg, nystatin 100,000 unit/mL Susp 8,000,000 Units, distilled water Liqd 80 mL, Take 5 mL by mouth every six (6) hours as needed. Swish, gargle, spit as needed.  May be swallowed if esophageal involvement., Disp: 200 mL, Rfl: 0  ???  amLODIPine (NORVASC) 5 MG tablet, Take 1 tablet (5 mg total) by mouth daily., Disp: 90 tablet, Rfl: 5  ???  cabozantinib 40 mg Tab, Take 40 mg by mouth daily., Disp: 30 tablet, Rfl: 5  ???  gabapentin (NEURONTIN) 300 MG capsule, Take 2 capsules (600 mg total) by mouth Three (3) times a day., Disp: 180 capsule, Rfl: 11  ???  ibuprofen (ADVIL,MOTRIN) 600 MG tablet, Take 1 tablet (600 mg total) by mouth every eight (8) hours as needed for pain., Disp: 60 tablet, Rfl: 2  ???  lidocaine (LIDODERM) 5 % patch, Place 1 patch on the skin daily. Apply to affected area for 12 hours only each day (then remove patch), Disp: , Rfl:   ???  lidocaine-diphenhydramine-aluminum-magnesium (FIRST-MOUTHWASH BLM) 200-25-400-40 mg/30 mL Mwsh, SWISH, GARGLE & SPIT BY MOUTH 1 TEASPOONFUL EVERY 6 HOURS AS NEEDED. MAY BE SWALLOWED IF ESOPHAGEAL INVOLVEMENT, Disp: , Rfl: 0  ???  MORPhine (MS CONTIN) 60 MG 12 hr tablet, Take 1 tablet (60 mg total) by mouth three (3) times a day., Disp: 21 tablet, Rfl: 0  ???  naloxone (NARCAN) 4 mg nasal spray, One spray in either nostril once for known/suspected opioid overdose. May repeat every 2-3 minutes in alternating nostril til EMS arrives, Disp: 1 each, Rfl: 0  ???  omeprazole (PRILOSEC) 20 MG capsule, Take 20 mg by mouth daily. TAKE 1 CAPSULE (20 MG TOTAL) BY MOUTH DAILY., Disp: , Rfl: 6  ???  ondansetron (ZOFRAN-ODT) 4 MG disintegrating tablet, Take 2 tablets (8 mg total) by mouth every eight (8) hours as needed for nausea., Disp: 60 tablet, Rfl: 3  ???  oxyCODONE (ROXICODONE) 10 mg immediate release tablet, Take 1 tablet (10 mg total) by mouth every four (4) hours as needed for pain., Disp: 42 tablet, Rfl: 0  ???  predniSONE (DELTASONE) 20 MG tablet, Take 3 tablets (60 mg total) by mouth daily., Disp: 63 tablet, Rfl: 0  ??? tiotropium (SPIRIVA WITH HANDIHALER) 18 mcg inhalation capsule, Place 18 mcg into inhaler and inhale., Disp: , Rfl:     Allergies  Patient has no known allergies.    Family History   Problem Relation Age of Onset   ??? Brain cancer Paternal Uncle        Social History  Social History     Tobacco Use   ??? Smoking status: Current Every Day Smoker     Packs/day: 1.00     Types: Cigarettes   ??? Smokeless tobacco: Never Used   Substance Use Topics   ??? Alcohol use: Yes   ??? Drug use: Yes  Review of Systems    Constitutional: Negative for fever.  Eyes: Negative for visual changes.  ENT: Negative for sore throat.  Cardiovascular: Negative for chest pain.  Respiratory: Negative for shortness of breath.  Gastrointestinal: Negative for abdominal pain, vomiting or diarrhea.  Genitourinary: Negative for dysuria.  Musculoskeletal: Positive for bilateral leg and hip pain. Negative for back pain.  Skin: Negative for rash.  Neurological: Negative for headaches, weakness or numbness.  10-point ROS reviewed and otherwise negative except as documented       Physical Exam     VITAL SIGNS:    ED Triage Vitals [04/06/18 0813]   Enc Vitals Group      BP 156/85      Heart Rate 99      SpO2 Pulse       Resp 24      Temp 36.5 ??C (97.7 ??F)      Temp Source Oral      SpO2 98 %      Weight 90.7 kg (200 lb)      Height 1.702 m (5' 7)     Constitutional: Alert and oriented. Well appearing and in no distress.  Eyes: Conjunctivae are normal.  ENT       Head: Normocephalic and atraumatic.       Nose: No congestion.       Mouth/Throat: Mucous membranes are moist.       Neck: No stridor.  Cardiovascular: Normal rate, regular rhythm. 2+ DP pulses bilaterally.   Respiratory: Normal respiratory effort. Minimal end exp wheeze b/l  Gastrointestinal: Soft and nontender, non-distended  Musculoskeletal:  ttp b/l thighs without skin changes, NVI distally. SILT. ROMI although limited to pain   Neurologic: Normal speech and language. No gross focal neurologic deficits are appreciated.  Skin: Skin is warm, dry and intact. No rash noted.  Psychiatric: Mood and affect are normal. Speech and behavior are normal.        Pertinent labs & imaging results that were available during my care of the patient were reviewed by me and considered in my medical decision making (see chart for details).    Carolee Rota, MD  Clinical Assistant Professor  [facility] Department of Emergency Medicine      ______________________________________________________   Documentation assistance was provided by Rolan Lipa, Scribe, on Apr 06, 2018 at 8:23 AM for Carolee Rota, MD.      Documentation assistance was provided by the scribe in my presence.  The documentation recorded by the scribe has been reviewed by me and accurately reflects the services I personally performed.           Berkley Harvey, MD  04/06/18 2019

## 2018-04-06 NOTE — Unmapped (Signed)
Patient rounds completed. The following patient needs were addressed: Pain w/ intervention,  Plan of Care, Call Bell in Reach, Bed Position Low with wheels locked. Pt resting in bed, NAD. No complaints or needs at the moment.

## 2018-04-07 ENCOUNTER — Ambulatory Visit
Admit: 2018-04-07 | Discharge: 2018-04-07 | Payer: MEDICAID | Attending: Geriatric Medicine | Primary: Geriatric Medicine

## 2018-04-07 ENCOUNTER — Ambulatory Visit: Admit: 2018-04-07 | Discharge: 2018-04-07 | Payer: MEDICAID

## 2018-04-07 DIAGNOSIS — F1410 Cocaine abuse, uncomplicated: Secondary | ICD-10-CM

## 2018-04-07 DIAGNOSIS — M25552 Pain in left hip: Principal | ICD-10-CM

## 2018-04-07 DIAGNOSIS — C7951 Secondary malignant neoplasm of bone: Secondary | ICD-10-CM

## 2018-04-07 DIAGNOSIS — M79662 Pain in left lower leg: Secondary | ICD-10-CM

## 2018-04-07 DIAGNOSIS — G893 Neoplasm related pain (acute) (chronic): Principal | ICD-10-CM

## 2018-04-07 DIAGNOSIS — C649 Malignant neoplasm of unspecified kidney, except renal pelvis: Secondary | ICD-10-CM

## 2018-04-07 DIAGNOSIS — Z0289 Encounter for other administrative examinations: Secondary | ICD-10-CM

## 2018-04-07 LAB — TOXICOLOGY SCREEN, URINE
BARBITURATE SCREEN URINE: <200
BENZODIAZEPINE SCREEN, URINE: <200
CANNABINOID SCREEN URINE: <20
METHADONE SCREEN, URINE: <300

## 2018-04-07 LAB — CANNABINOID SCREEN URINE: Lab: <20

## 2018-04-07 MED ORDER — METHADONE 5 MG TABLET
ORAL_TABLET | Freq: Three times a day (TID) | ORAL | 0 refills | 0.00000 days | Status: CP
Start: 2018-04-07 — End: 2018-04-07

## 2018-04-07 MED ORDER — METHADONE 5 MG TABLET: 10 mg | tablet | Freq: Three times a day (TID) | 0 refills | 0 days | Status: AC

## 2018-04-07 MED ORDER — OXYCODONE 10 MG TABLET
ORAL_TABLET | ORAL | 0 refills | 0 days | Status: SS | PRN
Start: 2018-04-07 — End: 2018-04-16

## 2018-04-07 NOTE — Unmapped (Addendum)
OUTPATIENT ONCOLOGY PALLIATIVE CARE    Principal Diagnosis: Russell [name] is a 57 y.o. 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 He then had XRT to right femur/hip completed 01/2018. He is now being treated with cabozantinib started  01/08/18 along with Prednisone 60mg  daily.    Assessment/Plan:    1. Pain:  Left hip and leg pain, most likely associated with bony metastases.  He previously had right leg pain s/p x1 palliative XRT fraction. Pain management is complicated by patient's active cocaine abuse, concern for opioid misuse based on last urine tox. Again, discussed that I will prescribe only if patient communicates openly about cocaine and other substance use.  I will continue opioid prescribing if patient continues cocaine use given stage 4 cancer with associated pain.  However, will not prescribe if patient takes opioids different than prescribed (and thereby runs out early) or has other opioids again on urine tox.  Also, will rotate long acting opioid 2/2 misuse of ms contin - rotate to methadone.    Plan:  -- STOP ms contin  -- START methadone 10 mg bid (QTC 415 today)  -- Continue Oxycodone 10mg  Q4hrs prn (1 week script)    -- consider palliative XRT to left hip - will communicate with Dr. Okey Dupre.    -- clarify gabapentin dose.  -- Continue Prednisone 60mg  (continue Omeprazole for GI ppx) - per oncology - concern for pulmonary side effects from tx.  -- Continue Ibuprofen prn     2.  Cocaine abuse: longstanding - discussed that open communication is essential.  He expresses interest in stopping use and dealing with stressors.  Has consistently had urine + for cocaine.    Plan:  -- [facility]iatry referral - appt later this week (did not keep last week's appt 2/2 being in ER)  -- continue urine tox monitoring - for substance use and to confirm appropriate opioid use      2. Constipation: opioid associated    Plan:  -- Senna 2 tabs BID  -- Miralax BID  -- adjust as indicated for BM every other day at minimum      3. Controlled substances risk management.   - Patient has a signed pain medication agreement with Outpatient Palliative Care, completed on 03/17/18, as per standard of care.   - [place] database was reviewed today and it was appropriate.   - Urine drug screen was performed at this visit. Findings: pending.   - Patient has received information about safe storage and administration of medications.   - Patient has received a prescription for narcan.       F/u: 2-4 weeks    ----------------------------------------  Referring Provider: Salem Caster MD  Oncology Team: [facility]  PCP: Ihor Austin, MD      HPI: Russell [name] is a 57 y.o. 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 He then had XRT to right femur/hip completed 01/2018. He is now  being treated with cabozantinib started  01/08/18 along with Prednisone 60mg  daily.    Today Russell [name] shares that he is aware his cancer has spread throughout his body, but is hoping his current chemotherapy will get rid of this stuff. He does want his providers to be direct with him about his prognosis, however, and while he shares he does not enjoy talking about his cancer, would want his providers to be straight-forward and honest about his prognosis should his cancer fail to respond to current therapies. In terms of symptoms, Russell [name] shares his pain is worst in his right thigh and hip, feels like someone stabbing me with an ice pick, is 10/10 (NRS) at its worst, and 2/10 after taking 10mg  Oxycodone which he reports using three times a day. Pt shares that Ibuprofen and lidocaine patches are also helpful in alleviating pain somewhat, though not as effective as Oxycodone. He often wakes at night with pain. He shares he previously had poor appetite, but this has improved on increasing his Prednisone dose from 20 to 60mg  a day. He otherwise denies significant symptoms, other than mild nausea after taking his medications in the morning that is resolved with Zofran. He denies HA, SOB, chest pain, abdominal pain, persistent nausea, constipation, depression, anxiety, or any other symptoms.    Regarding advance care planning, Russell [name] shares he has not thought much about what kind of interventions he would or would not want were he to become seriously ill, though he does want his HCPOA to be his friends [name] and [name], not any of his immediate family members.    Interval history, 03/24/18, Russell [name]: follow up - friend Herbert Seta was also present  -- went to the ER 2/2 running out of MS contin and oxycodone - received IV dilaudid and given 3 tablets of ms contin - last dose this am.  Reports 8 out of 10 pain in right leg - behind knee and in hip.  He took extra ms contin tablets and up to 5 doses of oxycodone/day - more than what was prescribed.  Pain level has been as low as 6 in last 24 hours - 8 is the highest level.  He also reports taking gabapentin 3 tablets three times day - which may be 900 tid - he didn't bring in pill bottles. Also, he is taking prednisone 60 mg qd.  He is also using OTC lidoderm patches.  -- patient reports using cocaine powder 3-4x/week for years (predating his cancer dx).  He denies other substance use, including MJ.  He drank 1/2 beer yesterday - etoh use limited by mouth sores from chemo - denies having etoh use disorder.  -- denies giving or selling his pain medications.  -- states not knowing next steps in cancer treatment - states understanding of having stage 4 cancer from dx, and that this means that he will die soon.  -- hasn't had a BM in 4 days -- not taking any laxatives    Interval history, 04/07/18, Russell [name]: follow up - friend [name] was present   -- ER visit in [facility] yesterday 2/2 running out of opioids a day early.  He was scheduled for CT scan - drank contrast but didn't stay 2/2 wait time - he states that his pain was increasing. Of note, he was seen in ER prior to CT and received dose of IV hydromorphone.  Patient c/o left hip, shin, knee pain.  Severity - worse than a kidney stone, currently 7 on  0-10 scale, has ranged from 3 to off the chart in the last 24 hours.  He has ms contin 60 mg tid and oxycodone 10 mg q 4 prn prescribed - when pain is severe, patient takes both more oxy and ms contin than prescribed.  Other analgesics - gabapentin TID (uncertain whether dose is 600 or 1200 mg), prednisone 60 mg qd (for concern re pneumonitis), ibuprofen (connie estimates that patient takes 2 tablets bid). He doesn't have pain all the time.  -- patient initially stated that his last cocaine use was 2 weeks ago - when informed that urine was then be negative for cocaine and that I would not prescribe unless he was open/honest with substance use, he reports using cocaine yesterday.  -- discussed urine tox from 2 weeks ago - hydrocodone present - patient denies taking - doesn't understand how could be in urine - of note, database does not have hydrocodone prescription - uncertain (though unlikely) prescription in ER  -- also had ER visit last week.    Palliative Performance Scale: 70% - Ambulation: Reduced / unable to do normal work, some evidence of disease / Self-Care: Full / Intake: Normal or reduced / Level of Conscious: Full    Coping/Support Issues: Issues with previously documented cocaine use.    Goals of Care: Pt currently focused on symptom relief, though wishes to continue any available cancer-directed treatments.    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  Hobbies: Previously played softball, likes to shoot pool at bars with his mom.  Current residence / distance from [facility]: lives in [place]    Advance Care Planning:   HCPOA: Wishes to have friends [name] [phone]), and [name] [phone]).  Natural surrogate decision maker: Mother  Living Will: None  ACP note: No    Objective     Opioid Risk Tool:  ?? Male  Male    Family history of substance abuse      Alcohol  1  3    Illegal drugs  2  3    Rx drugs  4  4    Personal history of substance abuse      Alcohol  3  3    Illegal drugs  4  4    Rx drugs  5  5    Age between 109???45 years  1  1    History of preadolescent sexual abuse  3  0    Psychological disease      ADD, OCD, bipolar, schizophrenia  2  2    Depression  1  1    ??  Total: Not completed today. Pt high risk by definition with previous documented cocaine use and prior prescribers refusing to continue providing pain medications due to concern for misuse/diversion (see assessment).  (<3 low risk, 4-7 moderate risk, >8 high risk)     No history exists.       Patient Active Problem List   Diagnosis   ??? Metastatic renal cell carcinoma to bone (CMS-HCC)   ??? Substance abuse (CMS-HCC)   ??? Cancer related pain       Past Medical History:   Diagnosis Date   ??? Bone metastasis (CMS-HCC)    ??? Renal cancer (CMS-HCC)        Past Surgical History:   Procedure Laterality Date   ??? FLEXIBLE BRONCHOSCOPY  07/24/2017   ???  HERNIA REPAIR         Current Outpatient Medications   Medication Sig Dispense Refill   ??? albuterol (PROVENTIL HFA;VENTOLIN HFA) 90 mcg/actuation inhaler Inhale 2 puffs.     ??? aluminum-magnesium hydroxide-simethicone 200-200-20 mg/5 mL Susp 80 mL, diphenhydrAMINE 12.5 mg/5 mL Liqd 200 mg, nystatin 100,000 unit/mL Susp 8,000,000 Units, distilled water Liqd 80 mL Take 5 mL by mouth every six (6) hours as needed. Swish, gargle, spit as needed.  May be swallowed if esophageal involvement. 200 mL 0   ??? amLODIPine (NORVASC) 5 MG tablet Take 1 tablet (5 mg total) by mouth daily. 90 tablet 5   ??? cabozantinib 40 mg Tab Take 40 mg by mouth daily. 30 tablet 5   ??? gabapentin (NEURONTIN) 300 MG capsule Take 2 capsules (600 mg total) by mouth Three (3) times a day. 180 capsule 11   ??? ibuprofen (ADVIL,MOTRIN) 600 MG tablet Take 1 tablet (600 mg total) by mouth every eight (8) hours as needed for pain. 60 tablet 2   ??? lidocaine (LIDODERM) 5 % patch Place 1 patch on the skin daily. Apply to affected area for 12 hours only each day (then remove patch)     ??? lidocaine-diphenhydramine-aluminum-magnesium (FIRST-MOUTHWASH BLM) 200-25-400-40 mg/30 mL Mwsh SWISH, GARGLE & SPIT BY MOUTH 1 TEASPOONFUL EVERY 6 HOURS AS NEEDED. MAY BE SWALLOWED IF ESOPHAGEAL INVOLVEMENT  0   ??? MORPhine (MS CONTIN) 30 MG 12 hr tablet Take 2 tablets (60 mg total) by mouth Two (2) times a day. for 5 days 4 tablet 0   ??? naloxone (NARCAN) 4 mg nasal spray One spray in either nostril once for known/suspected opioid overdose. May repeat every 2-3 minutes in alternating nostril til EMS arrives 1 each 0   ??? omeprazole (PRILOSEC) 20 MG capsule Take 20 mg by mouth daily. TAKE 1 CAPSULE (20 MG TOTAL) BY MOUTH DAILY.  6   ??? ondansetron (ZOFRAN-ODT) 4 MG disintegrating tablet Take 2 tablets (8 mg total) by mouth every eight (8) hours as needed for nausea. 60 tablet 3   ??? oxyCODONE (ROXICODONE) 10 mg immediate release tablet Take 1 tablet (10 mg total) by mouth every four (4) hours as needed for pain. 42 tablet 0   ??? oxyCODONE-acetaminophen (PERCOCET) 10-325 mg per tablet Take 1 tablet by mouth every four (4) hours as needed for pain. for up to 5 days 6 tablet 0   ??? predniSONE (DELTASONE) 20 MG tablet Take 3 tablets (60 mg total) by mouth daily. 63 tablet 0   ??? tiotropium (SPIRIVA WITH HANDIHALER) 18 mcg inhalation capsule Place 18 mcg into inhaler and inhale.       No current facility-administered medications for this visit.        Allergies: No Known Allergies    Family History:  Cancer-related family history includes Brain cancer in his paternal uncle.  indicated that the status of his paternal uncle is unknown.      REVIEW OF SYSTEMS:  A comprehensive review of 14 systems was negative except for pertinent positives noted in HPI.      PHYSICAL EXAM:   Vital signs for this encounter: VS reviewed in EPIC.  GEN: Awake and alert, caucasian male sitting in chair, uncomfortable.  PSYCH: Alert and oriented to person, place and time. Euthymic.  HEENT: Pupils equally round without scleral icterus. No facial asymmetry.  LUNGS: No increased work of breathing, no audible stridor or wheeze.  ABD: Non-distended  SKIN: No rashes, petechiae or jaundice noted  EXT: No edema noted of the lower extremities  NEURO: No focal deficits noted    Lab Results   Component Value Date    CREATININE 0.57 (L) 04/06/2018     Lab Results   Component Value Date    ALKPHOS 169 (H) 03/16/2018    BILITOT 0.3 03/16/2018    PROT 6.1 (L) 03/16/2018    ALBUMIN 3.2 (L) 03/16/2018    ALT 34 03/16/2018    AST 48 03/16/2018        I personally spent over half of a total 30. minutes in counseling and discussion with the patient as described above.    Doreatha Martin, MD  [facility] Outpatient Oncology Palliative Care.

## 2018-04-07 NOTE — Unmapped (Signed)
Pt came in today for CT and had to drink po contrast. He became upset when he was told he would have to wait an hour before we could bring him back for his scan, even if finished his po before an hour. Pt had front desk call back at exactly the hour of his po being started. We told them that we could not come out to get him at that moment as we had other patients on our tables. We went out to get him about 80 minutes after the start of his po. Pt had left. The front desk stated that he was leaving. We asked the front desk to call us if he returned.

## 2018-04-08 ENCOUNTER — Ambulatory Visit
Admit: 2018-04-08 | Discharge: 2018-04-16 | Disposition: A | Discharge status: Home / Self Care | Payer: MEDICAID | Admitting: Internal Medicine

## 2018-04-08 DIAGNOSIS — C649 Malignant neoplasm of unspecified kidney, except renal pelvis: Principal | ICD-10-CM

## 2018-04-08 LAB — CBC W/ AUTO DIFF
BASOPHILS RELATIVE PERCENT: 0.2 %
EOSINOPHILS ABSOLUTE COUNT: 0 10*9/L (ref 0.0–0.4)
HEMATOCRIT: 31.4 % — ABNORMAL LOW (ref 41.0–53.0)
HEMOGLOBIN: 10.1 g/dL — ABNORMAL LOW (ref 13.5–17.5)
LARGE UNSTAINED CELLS: 1 % (ref 0–4)
LYMPHOCYTES RELATIVE PERCENT: 14.4 %
MEAN CORPUSCULAR HEMOGLOBIN CONC: 32 g/dL (ref 31.0–37.0)
MEAN CORPUSCULAR HEMOGLOBIN: 26.9 pg (ref 26.0–34.0)
MEAN CORPUSCULAR VOLUME: 84 fL (ref 80.0–100.0)
MEAN PLATELET VOLUME: 7.1 fL (ref 7.0–10.0)
MONOCYTES ABSOLUTE COUNT: 0.1 10*9/L — ABNORMAL LOW (ref 0.2–0.8)
NEUTROPHILS ABSOLUTE COUNT: 4.3 10*9/L (ref 2.0–7.5)
NEUTROPHILS RELATIVE PERCENT: 81.7 %
PLATELET COUNT: 264 10*9/L (ref 150–440)
RED BLOOD CELL COUNT: 3.74 10*12/L — ABNORMAL LOW (ref 4.50–5.90)
RED CELL DISTRIBUTION WIDTH: 18.7 % — ABNORMAL HIGH (ref 12.0–15.0)
WBC ADJUSTED: 5.2 10*9/L (ref 4.5–11.0)

## 2018-04-08 LAB — EGFR MDRD NON AF AMER: Glomerular filtration rate/1.73 sq M.predicted.non black:ArVRat:Pt:Ser/Plas/Bld:Qn:Creatinine-based formula (MDRD): >=60

## 2018-04-08 LAB — BASIC METABOLIC PANEL
ANION GAP: 4 mmol/L — ABNORMAL LOW (ref 9–15)
BLOOD UREA NITROGEN: 22 mg/dL — ABNORMAL HIGH (ref 7–21)
CHLORIDE: 105 mmol/L (ref 98–107)
CO2: 29 mmol/L (ref 22.0–30.0)
CREATININE: 0.54 mg/dL — ABNORMAL LOW (ref 0.70–1.30)
EGFR MDRD AF AMER: >=60 mL/min/{1.73_m2} (ref >=60)
EGFR MDRD NON AF AMER: >=60 mL/min/{1.73_m2} (ref >=60)
GLUCOSE RANDOM: 87 mg/dL (ref 65–179)
POTASSIUM: 4.1 mmol/L (ref 3.5–5.0)
SODIUM: 138 mmol/L (ref 135–145)

## 2018-04-08 LAB — RED BLOOD CELL COUNT: Lab: 3.74 — ABNORMAL LOW

## 2018-04-08 NOTE — Unmapped (Signed)
-----   Message from Elijah Birk, RN sent at 04/08/2018 11:37 AM EDT -----  Pt is actually in [facility] ER and he said that he will be admitted and transferred here to [facility]. His CT scan are done.  Soraya  ----- Message -----  From: Belva Bertin, RN  Sent: 04/07/2018   4:04 PM  To: Elijah Birk, RN, #    Am Louann Liv on the message:), see below  ----- Message -----  From: Jennye Moccasin, MD  Sent: 04/07/2018   1:22 PM  To: Sonny Masters, MD, #    Ugh thanks. I suspect his cancer is progressing as well, although I also suspect he hadn't been taking his cabo for a while and he keeps rescheduling his scans so really hard to tell.    Correct he has not had left side radiated - I offered it to him last time I saw him but he declined then. Maybe he will be amenable if worsening.    Soraya - can you see if we can reschedule his scans? Looks like he drank the contrast and then wouldn't stay for scans. You can also ask if he wants to go back to rad onc in [place] or to be seen here for his left leg.    French Ana      ----- Message -----  From: Sonny Masters, MD  Sent: 04/07/2018   1:11 PM  To: Belva Bertin, RN, Jennye Moccasin, MD, #    French Ana -    I am concerned about him - today c/o left hip pain - if I'm correct he hasn't had XRT on that side - he's scheduled to see you later this week.    He's had x2 visits in last 2 weeks for pain, including running out early on meds yesterday.  He didn't stay for CT yesterday - impulsive behavior - couldn't wait 20 minutes. He also continues cocaine abuse.  His last urine included an opioid that wasn't prescribed to my knowledge.    He's scheduled to see Marianna Payment from [facility] this week - my hope is that he'll engage with mental health and substance abuse services - making lots of bad choices.    I let him know that I will prescribe even while he's using cocaine but not if he's running out early, lies to me about any substance use or has another urine with a non prescribed opioid.    Jillyn Hidden

## 2018-04-08 NOTE — Unmapped (Signed)
Discussed this patient with [facility] ED provider. Per ED provider this is a 57 year old male with PMH of renal cell carcinoma with known metastatic disease and lower extremity pain on home who presented to the [facility] ED due to concerns for worsening bilateral lower extremity pain. I was not able to examine the patient, however per ED provider, patient is diffusely tender throughout bilateral lower extremities with no specific point tender, no obvious soft tissue masses. Per ED patient reports increased pain with ambulation diffusely, but not specifically at the right knee. Advised ED patient did not require urgent transfer to Main campus for orthopedic evaluation, he could be weight bearing as tolerated with a walker. Advised we would discuss with our orthopedic oncologist regarding further management. If there is concern for neurogenic claudication or spinal cord compression would recommend spine imaging.

## 2018-04-08 NOTE — Unmapped (Signed)
[facility]  Emergency Department Progress Note    Apr 08, 2018 7:03 AM    Recent VS:  Recent Patient Data       04/07/2018 2254 04/08/2018 0327 04/08/2018 0649       BP:  174/93  167/94  179/90     Pulse:  89  95  97     Temp:  36.8 ??C (98.2 ??F)  36.6 ??C (97.9 ??F)  36.6 ??C (97.9 ??F)     Resp:  18  20  20      SpO2:  96 %  94 %  96 %         Russell [name] is a 57 y.o. male with PMH of renal cell carcinoma metastatic to bone who presents to the ED for acute on chronic pain that is not controlled with his current pain regimen. Plan for pain management in the ED and likely admission. CT chest pending at sign out. Of note, pt had XRT to R hip completed February 2019.     7:57 AM  Spoke with ortho regarding pathologic fracture of R iliac wing seen on CT A/P. Ortho requested XR femur to assess for worsening metastatic disease.     9:30 AM  Plan for admission to oncology at main. Contacted [facility].   ??   Additional Medical Decision Making   ??  I have reviewed the patient's vital signs and the nursing notes. Any pertinent labs & imaging results which were available during my care of the patient were reviewed by me.   I independently visualized the radiology images.   I reviewed the patient's prior medical records.   I discussed the case with the ortho consultant.   I discussed the case and plan for continuity of care with the admitting provider.     Portions of this record have been created using Scientist, clinical (histocompatibility and immunogenetics). Dictation errors have been sought, but may not have been identified and corrected.     ED Clinical Impression     Final diagnoses:   Metastatic renal cell carcinoma to bone (CMS-HCC) (Primary)     _________________________________  Documentation assistance was provided by Marquis Lunch, Scribe, on Apr 08, 2018 at 7:03 AM for Shaune Leeks, MD.    Apr 10, 2018 8:16 AM. Documentation assistance provided by the scribe. I was present during the time the encounter was recorded. The information recorded by the scribe was done at my direction and has been reviewed and validated by me.

## 2018-04-08 NOTE — Unmapped (Signed)
?? 

## 2018-04-08 NOTE — Unmapped (Signed)
MD at bedside with status update

## 2018-04-08 NOTE — Unmapped (Addendum)
[facility] Hospitalist History and Physical    Primary Care Physician: Marc Morgans ROSE, MD      HISTORY AND PHYSICAL    Problem List:  Principal Problem:    Cancer related pain  Active Problems:    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      Summary Statement:   In summary, Russell [name] is a 57 y.o. y/o male who presents to [facility] with bilateral lower extremity pain.    Assessment and Plan by Problem:  Cancer related pain: Opioid refractory pain in the setting of metastatic RCC  - Patient takes 90 OME daily and 20mg  methadone daily. Pt will place on dilaudid PCA 0.2 mg q 10 minutes. And continue methadone for now. Pt also has 2 mg IV q 2  Hours PRN severe refractory pain.  - consider chronic pain for consultation for ketamine infusion - will defer to palliative care. Pt may also benefit from IT pump or epidural given his pain is mostly concentrated in LE area.  - consider radiation oncology consult for further treatment of metastatic disease  - Palliative Care consulted for pain management, ACP and PC SW to help with completion of HCPOA. (discussed with Dr. Molli Hazard)  - IF after 6 hours, PCA dose is not effective could increase PCA dose by 50-100% (0.3 to 0.4 mg q 10 minutes PRN)      Diffusely Metastatic Renal Cell Carcinoma  -  consult oncology for further management   - continue cabozantinib PRN   - continue prednisone 60mg  daily   - continue further conversations re: GOC    Polysubstance Use Disorder  - concern for continued cocaine use in the setting of chronic opioid use  - Urine Toxicology pending- would consider discussion with Dr.Winzelber if opioid confirmatory testing is indicated.    Constipation  - DC colace and start Senna 2  tabs BID  - polyethylene PRN      Leukoplakia   - Magic mouthwash q6h prn     Tobacco Use  - nicotine patch ordered     HTN:  - amlodipine     F/E/N:   -Regular diet     Prophylaxis: -lovenox    Disposition:   - Will transfer to main campus    ------------------------------------------------------    PCP: Marc Morgans ROSE, MD   @PCPADD @    Chief Complaint: bilateral lower extremity pain    History of Present Illness:     Russell [name] is a 57 y.o. y/o male with PMHx of renal cell carcinoma metastatic to the bone who presents to [facility] with bilateral hip pain radiating into legs. He saw Dr. Legrand Como yesterday. During that visit, Dr. Legrand Como discontinued oxycontin and transitioned to methadone 10mg  BID yesterday. He was continued on oxycodone 10mg  q4hrs prn. He was in severe pain last night following this transition and decided to present to the hospital. At present, he is nauseous, but denies constipation or difficulty urinating. Denies numbness and tingling in bilateral lower extremities. He states a good pain level is 3/10 for him, but the last time he was at this level was 3 weeks ago. HE currently is 9/10.  Pt reports that he knows his disease is incurable and his housemate asked questions re: prognosis. Of note, the pt's house mate (partner) has asked this question outside the pt's room and is aware that this pt is  likely dying and has a poor prognosis.  He follows with palliative care.     * Last radiation to hip and femur in February 2019.     In the ED, he has received 20mg  methadone, 1mg  dilaudid, 30mg  oxycodone, 1000mg  robaxin, 30mg  toradol.    Allergies:  Patient has no known allergies.    Medications:   Prior to Admission medications    Medication Dose, Route, Frequency   albuterol (PROVENTIL HFA;VENTOLIN HFA) 90 mcg/actuation inhaler 2 puffs, Inhalation   aluminum-magnesium hydroxide-simethicone 200-200-20 mg/5 mL Susp 80 mL, diphenhydrAMINE 12.5 mg/5 mL Liqd 200 mg, nystatin 100,000 unit/mL Susp 8,000,000 Units, distilled water Liqd 80 mL 5 mL, Oral, Every 6 hours PRN, Swish, gargle, spit as needed.  May be swallowed if esophageal involvement.   amLODIPine (NORVASC) 5 MG tablet 5 mg, Oral, Daily (standard)   cabozantinib 40 mg Tab 40 mg, Oral, Daily (standard)   gabapentin (NEURONTIN) 300 MG capsule 600 mg, Oral, 3 times a day (standard)   ibuprofen (ADVIL,MOTRIN) 600 MG tablet 600 mg, Oral, Every 8 hours PRN   lidocaine (LIDODERM) 5 % patch 1 patch, Transdermal, Every 24 hours, Apply to affected area for 12 hours only each day (then remove patch)    lidocaine-diphenhydramine-aluminum-magnesium (FIRST-MOUTHWASH BLM) 200-25-400-40 mg/30 mL Mwsh SWISH, GARGLE & SPIT BY MOUTH 1 TEASPOONFUL EVERY 6 HOURS AS NEEDED. MAY BE SWALLOWED IF ESOPHAGEAL INVOLVEMENT   methadone (DOLOPHINE) 5 MG tablet 10 mg, Oral, 2 times a day   naloxone (NARCAN) 4 mg nasal spray One spray in either nostril once for known/suspected opioid overdose. May repeat every 2-3 minutes in alternating nostril til EMS arrives   omeprazole (PRILOSEC) 20 MG capsule 20 mg, Oral, Daily (standard), TAKE 1 CAPSULE (20 MG TOTAL) BY MOUTH DAILY.   ondansetron (ZOFRAN-ODT) 4 MG disintegrating tablet 8 mg, Oral, Every 8 hours PRN   oxyCODONE (ROXICODONE) 10 mg immediate release tablet 10 mg, Oral, Every 4 hours PRN   predniSONE (DELTASONE) 20 MG tablet 60 mg, Oral, Daily (standard)   tiotropium (SPIRIVA WITH HANDIHALER) 18 mcg inhalation capsule 18 mcg, Inhalation       Medical History:  Past Medical History:   Diagnosis Date   ??? Bone metastasis (CMS-HCC)    ??? Renal cancer (CMS-HCC)        Surgical History:  Past Surgical History:   Procedure Laterality Date   ??? FLEXIBLE BRONCHOSCOPY  07/24/2017   ??? HERNIA REPAIR         Social History:   Social History     Socioeconomic History   ??? Marital status: Single     Spouse name: None   ??? Number of children: None   ??? Years of education: None   ??? Highest education level: None   Occupational History   ??? None   Social Needs   ??? Financial resource strain: None   ??? Food insecurity:     Worry: None     Inability: None   ??? Transportation needs:     Medical: None     Non-medical: None   Tobacco Use   ??? Smoking status: Current Every Day Smoker     Packs/day: 1.00     Types: Cigarettes   ??? Smokeless tobacco: Never Used   Substance and Sexual Activity   ??? Alcohol use: Yes   ??? Drug use: Yes     Types: Cocaine   ??? Sexual activity: None   Lifestyle   ??? Physical activity:  Days per week: None     Minutes per session: None   ??? Stress: None   Relationships   ??? Social connections:     Talks on phone: None     Gets together: None     Attends religious service: None     Active member of club or organization: None     Attends meetings of clubs or organizations: None     Relationship status: None   Other Topics Concern   ??? None   Social History Narrative   ??? None   He lives in [place], [place] with a roommate. 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        Family History:   family history includes Brain cancer in his paternal uncle.    Code Status:  Full Code- addressed code status and I think pt would be amenable to continuous conversations with gentle manner further discussing risk/benefit ratio of CPR    Review of Systems:  10 system review is otherwise unremarkable.     Physical Exam:  Temp:  [36.6 ??C (97.9 ??F)-37.6 ??C (99.7 ??F)] 37.6 ??C (99.7 ??F)  Heart Rate:  [89-103] 103  Resp:  [18-20] 18  BP: (167-179)/(90-96) 170/96  SpO2:  [94 %-96 %] 96 %  BMI (Calculated):  [31] 31  Gen: Alert and oriented x 3. Sitting up in bed with eyes closed, in severe pain, obvious distress   HEENT: Normocephalic, atraumatic. Pupils equal, round, and reactive to light and accomodation. Extraocular movements intact. Sclerae clear. Leukoplakia, with concern for overriding fungal infection.   Neck: Supple without lymphadenopathy or JVD.  Lungs: Clear to auscultation bilaterally.  Heart: Normal rate, regular rhythm. Normal S1, S2. No murmurs, rubs, or gallops.  Abdomen: Soft, non-tender, non-distended. Bowel sounds present. No hepatosplenomegaly.  Extremities: No cyanosis or clubbing. Distal pulses 2+ and equal. Minimal edema in BLE. Increased edema of left knee with redness and swelling. Increased erythema on bilateral elbows, no scabs or signs of infection.   Skin: No rashes, lesions, or skin breakdown.  Neuro: Cranial nerves II-XII grossly intact. No tingling or loss of sensation in extremities. 5/5 strength in BUE and BLE.             Test Results:  Labs: I personally reviewed the lab results.  Recent Results (from the past 24 hour(s))   Basic Metabolic Panel    Collection Time: 04/08/18  3:11 AM   Result Value Ref Range    Sodium 138 135 - 145 mmol/L    Potassium 4.1 3.5 - 5.0 mmol/L    Chloride 105 98 - 107 mmol/L    CO2 29.0 22.0 - 30.0 mmol/L    BUN 22 (H) 7 - 21 mg/dL    Creatinine 1.61 (L) 0.70 - 1.30 mg/dL    BUN/Creatinine Ratio 41     EGFR MDRD Non Af Amer >=60 >=60 mL/min/1.32m2    EGFR MDRD Af Amer >=60 >=60 mL/min/1.54m2    Anion Gap 4 (L) 9 - 15 mmol/L    Glucose 87 65 - 179 mg/dL    Calcium 8.6 8.5 - 09.6 mg/dL   CBC w/ Differential    Collection Time: 04/08/18  3:11 AM   Result Value Ref Range    WBC 5.2 4.5 - 11.0 10*9/L    RBC 3.74 (L) 4.50 - 5.90 10*12/L    HGB 10.1 (L) 13.5 - 17.5 g/dL    HCT 04.5 (L) 40.9 - 53.0 %    MCV 84.0 80.0 -  100.0 fL    MCH 26.9 26.0 - 34.0 pg    MCHC 32.0 31.0 - 37.0 g/dL    RDW 20.2 (H) 54.2 - 15.0 %    MPV 7.1 7.0 - 10.0 fL    Platelet 264 150 - 440 10*9/L    Neutrophils % 81.7 %    Lymphocytes % 14.4 %    Monocytes % 2.5 %    Eosinophils % 0.4 %    Basophils % 0.2 %    Absolute Neutrophils 4.3 2.0 - 7.5 10*9/L    Absolute Lymphocytes 0.8 (L) 1.5 - 5.0 10*9/L    Absolute Monocytes 0.1 (L) 0.2 - 0.8 10*9/L    Absolute Eosinophils 0.0 0.0 - 0.4 10*9/L    Absolute Basophils 0.0 0.0 - 0.1 10*9/L    Large Unstained Cells 1 0 - 4 %    Microcytosis Slight (A) Not Present    Anisocytosis Moderate (A) Not Present   ECG 12 Lead    Collection Time: 04/08/18 11:52 AM   Result Value Ref Range    EKG Systolic BP  mmHg    EKG Diastolic BP  mmHg    EKG Ventricular Rate 103 BPM    EKG Atrial Rate 103 BPM    EKG P-R Interval 142 ms    EKG QRS Duration 92 ms    EKG Q-T Interval 326 ms    EKG QTC Calculation 427 ms    EKG Calculated P Axis 38 degrees    EKG Calculated R Axis 8 degrees    EKG Calculated T Axis 28 degrees    QTC Fredericia 390 ms     CXR:   -- Interval worsening of multifocal bilateral lung nodules and nodularity along the bilateral major fissures as compared to the prior CT dated 03/16/2018; the primary differential consideration is for worsening metastatic disease.  --Interval appearance of nodular septal thickening and worsening nodularity along the fissures; raises the possibility of lymphangitic spread of disease.  -- Similar-appearing right lower lobe consolidation.  -- Unchanged mediastinal and bilateral hilar lymphadenopathy; concerning for nodal metastases.  --New trace bilateral pleural effusions.     XR Tibia Fibula Left  No fracture or malalignment. No lytic or sclerotic osseous lesion of the left tibia or fibula.    XR Tibia Fibula Right    Distal femur and proximal tibia osseous lesions, likely representing metastases. No evidence for acute fracture or pathologic fracture.        XR Femur 2 views bilateral    No acute fracture or malalignment. Extensive metastatic lesions throughout the bilateral femora most prominent in the left proximal femur and distal right femur. Right distal femur lesion appears slightly more sclerotic.        CT Abdomen Pelvis with oral and IV Contrast   --No acute intra-abdominal pathology.  -- Redemonstration of right exophytic renal cell carcinoma with numerous osseous metastases including pathologic fracture of the right iliac wing.  -- Indeterminate 4 mm hypervascular focus in segment 3 of liver is unchanged. In the setting of renal cell carcinoma, this may represent a hepatic metastases, however, small size limits further evaluation. MRI may be helpful if clinically warranted.  --Please refer to concurrent CT chest for findings above the diaphragm. ___________________________________________________  Scribe's Attestation: Gerri Lins, MD obtained and performed the history, physical exam and medical decision making elements that were  entered into the chart. Documentation assistance was provided by me personally, a scribe. Signed by Janeann Forehand, scribe, on Apr 08, 2018 at 11:11 AM.         ----------------------------------------------------------------------------------------------------------------------  Apr 08, 2018 1:31 PM. Documentation assistance provided by the Scribe. I was present during the time the encounter was recorded. The information recorded by the Scribe was done at my direction and has been reviewed and validated by me.  ----------------------------------------------------------------------------------------------------------------------

## 2018-04-08 NOTE — Unmapped (Signed)
Pt completed po contrast, ct tech informed

## 2018-04-08 NOTE — Unmapped (Signed)
Pt sleeping; breathing even and unlabored. Family at bedside

## 2018-04-08 NOTE — Unmapped (Signed)
Report from [place], Charity fundraiser. Patient care transferred at this time.

## 2018-04-08 NOTE — Unmapped (Signed)
Report given to [facility], RN. Patient care transferred at this time.

## 2018-04-08 NOTE — Unmapped (Signed)
Pt visitor eating breakfast at bedside; pt and visitor provided status update

## 2018-04-08 NOTE — Unmapped (Signed)
Patient rounds completed. The following patient needs were addressed:  Pain, Plan of Care, Call Bell in Reach and Bed Position Low . Pt resting/sleeping upon entering room. Awake to verbal stimuli. No distress noted. Pt reports continued pain, med order received and admin.

## 2018-04-08 NOTE — Unmapped (Signed)
Emergency Department Provider Note        ED Clinical Impression     Final diagnoses:   None       ED Assessment/Plan   57 year old male with history of renal cell carcinoma metastatic to bone with acute on chronic pain not controlled by current regimen by his oncologist.  Plan for pain management with oxycodone, Toradol, Robaxin.  Patient was to receive CT scan, will proceed with chest abdomen pelvis CT scan    6:23 AM  Patient reassessed, sleeping, will not wake at this time.  Await CT scan results    6:57 AM  Care passed to Dr Clinton Sawyer.  Plan for admission given new pathologic fracture of right iliac wing on CT scan.    History     Chief Complaint   Patient presents with   ??? Pain     Pt presents to ER alert and in NAD. Pt has chronic pain secondary to cancer. Pt has been seen multiple times in ER for same complaint, pt saw his pain management doctor today but reports that the medications are not helping.      HPI  57 year old male presents to the emergency department with complaint of bilateral hip pain radiating down into his legs.  This is an ongoing issue secondary to cancer.  He was seen today by his ecologist who is helping manage his pain.  Due to concerns over misuse of his OxyContin, patient was switched to methadone and oxycodone for breakthrough pain every 4 hours.  Patient has taken 2 doses of his methadone at 8:30 AM and 8:30 PM without improvement.  He has been taking his 10 mg of oxycodone every 4 hours.  Patient was told by his oncologist to come to the emergency department for further evaluation to see him of his cancer had worsened.  Patient was due to have CT scans but left due to wait after drinking his contrast.  New weakness or numbness, no fevers or chills, family denies any confusion or other new changes in his baseline behavior  Past Medical History:   Diagnosis Date   ??? Bone metastasis (CMS-HCC)    ??? Renal cancer (CMS-HCC)        Past Surgical History:   Procedure Laterality Date   ??? FLEXIBLE BRONCHOSCOPY  07/24/2017   ??? HERNIA REPAIR         Family History   Problem Relation Age of Onset   ??? Brain cancer Paternal Uncle        Social History     Socioeconomic History   ??? Marital status: Single     Spouse name: None   ??? Number of children: None   ??? Years of education: None   ??? Highest education level: None   Occupational History   ??? None   Social Needs   ??? Financial resource strain: None   ??? Food insecurity:     Worry: None     Inability: None   ??? Transportation needs:     Medical: None     Non-medical: None   Tobacco Use   ??? Smoking status: Current Every Day Smoker     Packs/day: 1.00     Types: Cigarettes   ??? Smokeless tobacco: Never Used   Substance and Sexual Activity   ??? Alcohol use: Yes   ??? Drug use: Yes   ??? Sexual activity: None   Lifestyle   ??? Physical activity:     Days per week: None  Minutes per session: None   ??? Stress: None   Relationships   ??? Social connections:     Talks on phone: None     Gets together: None     Attends religious service: None     Active member of club or organization: None     Attends meetings of clubs or organizations: None     Relationship status: None   Other Topics Concern   ??? None   Social History Narrative   ??? None       Review of Systems  10+ point review of systems negative except as detailed in HPI.      Physical Exam     BP 167/94  - Pulse 95  - Temp 36.6 ??C (97.9 ??F) (Oral)  - Resp 20  - Ht 170.2 cm (5' 7)  - Wt 89.8 kg (198 lb)  - SpO2 94%  - BMI 31.01 kg/m??     Physical Exam   Constitutional: He is oriented to person, place, and time. He appears well-developed and well-nourished. He appears distressed.   Uncomfortable appearing   HENT:   Head: Normocephalic and atraumatic.   Nose: Nose normal.   Mouth/Throat: Oropharynx is clear and moist. No oropharyngeal exudate.   Eyes: Pupils are equal, round, and reactive to light. Conjunctivae and EOM are normal.   Neck: Normal range of motion. Neck supple. No JVD present. No tracheal deviation present. No thyromegaly present.   Cardiovascular: Normal rate, regular rhythm, normal heart sounds and intact distal pulses. Exam reveals no gallop and no friction rub.   No murmur heard.  Pulmonary/Chest: Effort normal and breath sounds normal. No stridor. No respiratory distress. He has no wheezes. He has no rales. He exhibits no tenderness.   Abdominal: Soft. Bowel sounds are normal. He exhibits no distension and no mass. There is no tenderness. There is no rebound and no guarding. No hernia.   Musculoskeletal: He exhibits no edema, tenderness or deformity.   Patient has limited range of motion at hips secondary to pain   Lymphadenopathy:     He has no cervical adenopathy.   Neurological: He is alert and oriented to person, place, and time. He has normal reflexes. No sensory deficit. He exhibits normal muscle tone. Coordination normal.   Skin: Skin is warm and dry. Capillary refill takes less than 2 seconds. No rash noted. He is not diaphoretic. No erythema. No pallor.   Psychiatric: He has a normal mood and affect. His behavior is normal. Judgment and thought content normal.   Nursing note and vitals reviewed.      ED Course           MDM  Reviewed: previous chart, nursing note and vitals  Reviewed previous: labs and CT scan  Interpretation: labs and CT scan         Desma Mcgregor, MD  04/08/18 [id number]

## 2018-04-08 NOTE — Unmapped (Signed)
Rounding completed.  Pt resting in stretcher.  Breathing appears unlabored. Visitor at bedside.   Plan of care discussed and questions answered. Call bell within reach. Stretcher low and locked. No signs of acute distress.

## 2018-04-08 NOTE — Unmapped (Signed)
Patient transported to X-ray  Transported by Radiology  How tranported Stretcher  Cardiac Monitor no

## 2018-04-08 NOTE — Unmapped (Signed)
Hospitalist at bedside for evaluation

## 2018-04-08 NOTE — Unmapped (Signed)
Pt presents to ER alert and in NAD. Pt has chronic pain secondary to cancer. Pt has been seen multiple times in ER for same complaint, pt saw his pain management doctor today but reports that the medications are not helping.

## 2018-04-08 NOTE — Unmapped (Signed)
Patient was placed on O2 after giving pain medications.

## 2018-04-08 NOTE — Unmapped (Signed)
Patient returned from X-ray  Transported by Radiology  How tranported Stretcher  Cardiac Monitor no

## 2018-04-08 NOTE — Unmapped (Signed)
Hospitalist made decision to transfer PT to [facility]. Patient verbalized understanding. Waiting for transfer at this time.     At this time, PT and his spouse walking around ED (PT in [facility]).

## 2018-04-08 NOTE — Unmapped (Signed)
ORTHOPAEDIC CONSULT    Assessment and Plan  57 y.o. 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  Patient presents to the ED for worsening of his chronic pain, denies any trauma or falls, repeat imaging shows no evidence of acute fracture.  ?? No acute surgical intervention indicated   ?? Weight-bearing as tolerated, would recommend with a walker for support  ?? Agree with consult to radiation oncology for possible radiation therapy.  ?? Will discuss further with the orthopedic oncology team for further recommendations and plan of care  - Weight Bearing Status/Activity: weightbearing as tolerated.  - Recommended labs: No new labs needed.  - Pertinent medical history: smoking and renal cell carcinoma  - Pain control: per ED/primary  - If applicable, patient advised to stop use of tobacco.  - Follow-up plan: we will call patient at [phone] (home)  to arrange follow-up.    - Primary Service for this Patient: [facility]    * Please page Clement Sayres, NP [phone] with any questions while patient in inpatient. Please contact the resident who leaves daily progress notes for any questions after 3pm or Page orthopaedic consult pager [phone]) on nights (after 5PM) and weekends.       Procedure(s)  none     Subjective     History of Present Illness:   Chief Complaint: Bilateral lower extremity pain  57 y.o. male RHD w/ hx of drug abuse, renal cell carcinoma with known bony metastases who presents to the [facility] emergency department for poorly controlled pain at home.  Patient is wife reports that he was recently seen in his palliative care oncology clinic where his home narcotic medications were changed.  They report that since that time his pain has been poorly controlled at home and he presented to the ER today for evaluation.  Patient reports pain in his bilateral lower legs diffusely, but but worst on the anterior lateral bilateral thighs.  He reports diffuse joint pain with range of motion.  Reports pain with ambulation but does not think this is necessarily dramatically worse than when resting in bed.  Denies any recent trauma or falls.    Pain is described as sharp, made worse with movement, and relieved with rest.  Pain does not radiate.  Denies other pain/injury. Denies new onset numbness or tingling. Denies fevers or chills.    Medical History:  Past Medical History:   Diagnosis Date   ??? Bone metastasis (CMS-HCC)    ??? Renal cancer (CMS-HCC)     Surgical History  Past Surgical History:   Procedure Laterality Date   ??? FLEXIBLE BRONCHOSCOPY  07/24/2017   ??? HERNIA REPAIR        Medications:   Current Facility-Administered Medications   Medication Dose Route Frequency Provider Last Rate Last Dose   ??? albuterol (PROVENTIL HFA;VENTOLIN HFA) 90 mcg/actuation inhaler 2 puff  2 puff Inhalation Q4H PRN Mauri Brooklyn, MD       ??? amLODIPine (NORVASC) tablet 5 mg  5 mg Oral Daily Mauri Brooklyn, MD       ??? enoxaparin (LOVENOX) syringe 40 mg  40 mg Subcutaneous Q24H [facility] Mauri Brooklyn, MD       ??? gabapentin (NEURONTIN) capsule 600 mg  600 mg Oral TID Mauri Brooklyn, MD       ??? HYDROmorphone (DILAUDID)  50mg /13ml (1mg /ml) PCA CADD   Intravenous Continuous Mauri Brooklyn, MD       ??? HYDROmorphone (PF) (DILAUDID) injection 2 mg  2 mg Intravenous Q4H PRN Mauri Brooklyn, MD   2 mg at 04/08/18 1557   ??? lidocaine (LIDODERM) 5 % patch 1 patch  1 patch Transdermal Q24H Mauri Brooklyn, MD       ??? lidocaine-diphenhydramine-aluminum-magnesium (FIRST-MOUTHWASH BLM) mouthwash 10 mL  10 mL Mouth 4x Daily PRN Lillia Carmel, MD       ??? methadone (DOLOPHINE) tablet 10 mg  10 mg Oral BID Mauri Brooklyn, MD       ??? naloxone (NARCAN) injection 0.4 mg  0.4 mg Intravenous Q5 Min PRN Mauri Brooklyn, MD       ??? nicotine (NICODERM CQ) 14 mg/24 hr patch 1 patch  1 patch Transdermal Daily Mauri Brooklyn, MD       ??? nicotine polacrilex (NICORETTE) gum 2 mg  2 mg Buccal Q1H PRN Mauri Brooklyn, MD        Or   ??? nicotine polacrilex (NICORETTE) lozenge 2 mg  2 mg Buccal Q1H PRN Mauri Brooklyn, MD       ??? ondansetron [facility]) injection 4 mg  4 mg Intravenous Q6H PRN Mauri Brooklyn, MD       ??? ondansetron (ZOFRAN-ODT) disintegrating tablet 8 mg  8 mg Oral Q8H PRN Mauri Brooklyn, MD       ??? [START ON 04/09/2018] pantoprazole (PROTONIX) EC tablet 20 mg  20 mg Oral Daily Mauri Brooklyn, MD       ??? polyethylene glycol (MIRALAX) packet 17 g  17 g Oral Daily PRN Mauri Brooklyn, MD       ??? predniSONE (DELTASONE) tablet 60 mg  60 mg Oral Daily Mauri Brooklyn, MD       ??? senna [facility]) tablet 2 tablet  2 tablet Oral BID Mauri Brooklyn, MD        Allergies:  Patient has no known allergies.   Social History:  Tobacco use: current smoker, 1/3 packs per day.  Alcohol use: denies.  Drug use: denies.  Employment: on disability.  Prior ambulatory status: No limitations, community ambulator. Family History:  family history includes Brain cancer in his paternal uncle..     Review of Systems 10 point ROS completed and negative except as stated above.     Objective  Physical Exam  Vitals: Blood pressure 173/90, pulse 104, temperature 36.7 ??C, temperature source Oral, resp. rate 18, height 170.2 cm (5' 7.01), weight 89.8 kg (197 lb 15.6 oz), SpO2 97 %.  General Appearance: No acute distress, laying in bed  Cardiovascular: Palpable radial pulses and DP pulses bilaterally.   Neuro/Compartments: Bilateral upper extremities: +motor in Axillary, AIN, PIN, Ulnar distributions. SILT in axillary, medial, radial, and ulnar nerve distributions. Compartments are soft and compressible. No pain with passive stretch of fingers.  Bilateral lower extremities: +motor in GS/TA/EHL. SILT in superficial/deep peroneal, saphenous, sural and tibial nerve distributions. Compartments are soft and compressible. No pain with passive stretch of toes.    Musculoskeletal:    Right Upper Extremity  Inspection: no swelling, deformity, or effusion.   Compartments soft and compressible, no pain on passive stretch  Diffuse mild tenderness to palpation throughout extremity  Active ROM is painless at fingers, wrist, elbow, shoulder.   No crepitus with passive ROM of fingers, wrist, elbow, mild crepitus with passive range of motion of  the shoulder, patient reports secondary to known arthritis.   Skin intact.   Motor: + OK (AIN median n), EPL (PIN radial n), IO (ulnar n)  SILT in medial, radial, and ulnar distributions.   + radial pulse with warm and well perfused digits.     Left Upper Extremity  Inspection: no swelling, deformity, or effusion.   Compartments soft and compressible, no pain on passive stretch  Mild diffuse tenderness to palpation throughout extremity  Active ROM is painless at fingers, wrist, elbow, shoulder.   No crepitus with passive ROM of fingers, wrist, elbow, shoulder.   Skin intact.   Motor: + OK (AIN median n), EPL (PIN radial n), IO (ulnar n)  SILT in medial, radial, and ulnar distributions.   + radial pulse with warm and well perfused digits.     Right Lower Extremity  No swelling, deformity, or effusion.   Skin intact.  Diffuse severe tenderness to palpation throughout extremity.   Moderate pain with passive and active ROM at hip, knee, ankle.   No gross instability noted  + GS/TA/EHL.   SILT in DP/SP/S/S/P distributions.  + DP pulse with warm and well perfused toes.     Left lower Extremity  No swelling, deformity, or effusion.   Skin intact.  Diffuse severe tenderness to palpation throughout extremity.   Moderate pain with passive and active ROM at hip, knee, ankle.   No gross instability noted  + GS/TA/EHL.   SILT in DP/SP/S/S/P distributions.  + DP pulse with warm and well perfused toes.       Test Results  Imaging: Imaging was personally reviewed and show Stable sclerotic lesions in the right distal femur, right proximal tibia, right iliac wing consistent with known metastatic cancer.    Labs:   All lab results last 24 hours:    Recent Results (from the past 24 hour(s))   Basic Metabolic Panel    Collection Time: 04/08/18  3:11 AM   Result Value Ref Range    Sodium 138 135 - 145 mmol/L    Potassium 4.1 3.5 - 5.0 mmol/L    Chloride 105 98 - 107 mmol/L    CO2 29.0 22.0 - 30.0 mmol/L    BUN 22 (H) 7 - 21 mg/dL    Creatinine 5.78 (L) 0.70 - 1.30 mg/dL    BUN/Creatinine Ratio 41     EGFR MDRD Non Af Amer >=60 >=60 mL/min/1.39m2    EGFR MDRD Af Amer >=60 >=60 mL/min/1.10m2    Anion Gap 4 (L) 9 - 15 mmol/L    Glucose 87 65 - 179 mg/dL    Calcium 8.6 8.5 - 46.9 mg/dL   CBC w/ Differential    Collection Time: 04/08/18  3:11 AM   Result Value Ref Range    WBC 5.2 4.5 - 11.0 10*9/L    RBC 3.74 (L) 4.50 - 5.90 10*12/L    HGB 10.1 (L) 13.5 - 17.5 g/dL    HCT 62.9 (L) 52.8 - 53.0 %    MCV 84.0 80.0 - 100.0 fL    MCH 26.9 26.0 - 34.0 pg    MCHC 32.0 31.0 - 37.0 g/dL    RDW 41.3 (H) 24.4 - 15.0 %    MPV 7.1 7.0 - 10.0 fL    Platelet 264 150 - 440 10*9/L    Neutrophils % 81.7 %    Lymphocytes % 14.4 %    Monocytes % 2.5 %    Eosinophils % 0.4 %  Basophils % 0.2 %    Absolute Neutrophils 4.3 2.0 - 7.5 10*9/L    Absolute Lymphocytes 0.8 (L) 1.5 - 5.0 10*9/L    Absolute Monocytes 0.1 (L) 0.2 - 0.8 10*9/L    Absolute Eosinophils 0.0 0.0 - 0.4 10*9/L    Absolute Basophils 0.0 0.0 - 0.1 10*9/L    Large Unstained Cells 1 0 - 4 %    Microcytosis Slight (A) Not Present    Anisocytosis Moderate (A) Not Present   ECG 12 Lead    Collection Time: 04/08/18 11:52 AM   Result Value Ref Range    EKG Systolic BP  mmHg    EKG Diastolic BP  mmHg    EKG Ventricular Rate 103 BPM    EKG Atrial Rate 103 BPM    EKG P-R Interval 142 ms    EKG QRS Duration 92 ms    EKG Q-T Interval 326 ms    EKG QTC Calculation 427 ms    EKG Calculated P Axis 38 degrees    EKG Calculated R Axis 8 degrees    EKG Calculated T Axis 28 degrees    QTC Fredericia 390 ms         Problem List  Principal Problem:    Cancer related pain  Active Problems:    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

## 2018-04-09 ENCOUNTER — Ambulatory Visit
Admit: 2018-04-09 | Discharge: 2018-04-09 | Payer: MEDICAID | Attending: Geriatric Medicine | Primary: Geriatric Medicine

## 2018-04-09 ENCOUNTER — Ambulatory Visit: Admit: 2018-04-09 | Discharge: 2018-04-09 | Payer: MEDICAID

## 2018-04-09 ENCOUNTER — Ambulatory Visit: Admit: 2018-04-09 | Discharge: 2018-04-09 | Payer: MEDICAID | Attending: Internal Medicine | Primary: Internal Medicine

## 2018-04-09 DIAGNOSIS — C649 Malignant neoplasm of unspecified kidney, except renal pelvis: Principal | ICD-10-CM

## 2018-04-09 LAB — CBC W/ AUTO DIFF
BASOPHILS ABSOLUTE COUNT: 0 10*9/L (ref 0.0–0.1)
BASOPHILS RELATIVE PERCENT: 0.2 %
EOSINOPHILS ABSOLUTE COUNT: 0 10*9/L (ref 0.0–0.4)
EOSINOPHILS RELATIVE PERCENT: 0.2 %
HEMATOCRIT: 35.8 % — ABNORMAL LOW (ref 41.0–53.0)
HEMOGLOBIN: 11.2 g/dL — ABNORMAL LOW (ref 13.5–17.5)
LARGE UNSTAINED CELLS: 1 % (ref 0–4)
LYMPHOCYTES ABSOLUTE COUNT: 0.6 10*9/L — ABNORMAL LOW (ref 1.5–5.0)
LYMPHOCYTES RELATIVE PERCENT: 11.2 %
MEAN CORPUSCULAR HEMOGLOBIN CONC: 31.3 g/dL (ref 31.0–37.0)
MEAN CORPUSCULAR VOLUME: 84.2 fL (ref 80.0–100.0)
MEAN PLATELET VOLUME: 7.4 fL (ref 7.0–10.0)
MONOCYTES RELATIVE PERCENT: 2.6 %
NEUTROPHILS RELATIVE PERCENT: 85.3 %
PLATELET COUNT: 330 10*9/L (ref 150–440)
RED BLOOD CELL COUNT: 4.25 10*12/L — ABNORMAL LOW (ref 4.50–5.90)
RED CELL DISTRIBUTION WIDTH: 17.9 % — ABNORMAL HIGH (ref 12.0–15.0)
WBC ADJUSTED: 5.3 10*9/L (ref 4.5–11.0)

## 2018-04-09 LAB — ALT (SGPT): Alanine aminotransferase:CCnc:Pt:Ser/Plas:Qn:: 39

## 2018-04-09 LAB — COMPREHENSIVE METABOLIC PANEL
ALBUMIN: 3.1 g/dL — ABNORMAL LOW (ref 3.5–5.0)
ALT (SGPT): 39 U/L (ref 19–72)
ANION GAP: 10 mmol/L (ref 9–15)
AST (SGOT): 59 U/L — ABNORMAL HIGH (ref 19–55)
BILIRUBIN TOTAL: 0.5 mg/dL (ref 0.0–1.2)
BLOOD UREA NITROGEN: 18 mg/dL (ref 7–21)
BUN / CREAT RATIO: 34
CALCIUM: 8.5 mg/dL (ref 8.5–10.2)
CHLORIDE: 98 mmol/L (ref 98–107)
CREATININE: 0.53 mg/dL — ABNORMAL LOW (ref 0.70–1.30)
EGFR MDRD AF AMER: >=60 mL/min/{1.73_m2} (ref >=60)
EGFR MDRD NON AF AMER: >=60 mL/min/{1.73_m2} (ref >=60)
GLUCOSE RANDOM: 127 mg/dL (ref 65–179)
POTASSIUM: 5.6 mmol/L — ABNORMAL HIGH (ref 3.5–5.0)
PROTEIN TOTAL: 5.9 g/dL — ABNORMAL LOW (ref 6.5–8.3)
SODIUM: 137 mmol/L (ref 135–145)

## 2018-04-09 LAB — HYPOCHROMIA

## 2018-04-09 NOTE — Unmapped (Signed)
Patient admitted to [facility] from [facility]. 2mg  IV dilaudid given upon admission. Dilaudid demand PCA initiated for chronic BLE and lower back pain. VRE sent. Afebrile, VSS, will continue to monitor.

## 2018-04-09 NOTE — Unmapped (Signed)
ORTHOPAEDIC SPINE CONSULT  - Primary Service for this Patient: [facility]    ASSESSMENT AND PLAN:  57 y.o. male with the following: Renal cell carcinoma with metastases to cervical, thoracic, and lumbar spine.    - Incomplete recent spinal imaging   - Bilateral lower extremity pain concerning for neurogenic claudication   - Would recommend MRI of C, T, L spines to rule out spinal cord compression   - Agree with radiation oncology consult   - Will discuss further reccomendations and plan of care with orthopedic spine team.    - Weight Bearing Status/Activity: weightbearing as tolerated.    - Recommended labs: No new labs needed.    - Pain control: per primary service.    - Tobacco use: Patient advised to quit smoking    - Follow-up plan: to be determined.    - Phone number: [phone] (home)     * Please contact the resident who leaves daily progress notes for any questions while patient is inpatient.  Page orthopaedic consult pager on nights (after 5PM) and weekends.       SUBJECTIVE     Chief Complaint:  Bilateral lower extremity pain    History of Present Illness:   57 y.o. male *HD who is being seen in consultation at the request of Self, Referred.    57 y.o. male RHD w/ hx of drug abuse, renal cell carcinoma with known bony metastases who presents to the [facility] emergency department for poorly controlled pain at home.  Patient is wife reports that he was recently seen in his palliative care oncology clinic where his home narcotic medications were changed.  They report that since that time his pain has been poorly controlled at home and he presented to the ER today for evaluation.  Patient reports pain in his bilateral lower legs diffusely, but but worst on the anterior lateral bilateral thighs.  He reports diffuse joint pain with range of motion.  Reports pain with ambulation but does not think this is necessarily dramatically worse than when resting in bed.  Denies any recent trauma or falls. Pain is described as sharp, made worse with movement, and relieved with rest.  Pain does not radiate.  Denies other pain/injury. Denies new onset numbness or tingling. Denies fevers or chills.    The patient endorses pain in the mid and low back. This has been ongoing for several months. his symptoms have been progressively worsening.   he denies any numbness and weakness in his lower extremities.   he denies any gait or balance issues.  he denies any bowel or bladder dysfunction.           Medical History   Past Medical History:   Diagnosis Date   ??? Bone metastasis (CMS-HCC)    ??? Renal cancer (CMS-HCC)       Surgical History   Past Surgical History:   Procedure Laterality Date   ??? FLEXIBLE BRONCHOSCOPY  07/24/2017   ??? HERNIA REPAIR        Medications     Current Facility-Administered Medications:   ???  albuterol (PROVENTIL HFA;VENTOLIN HFA) 90 mcg/actuation inhaler 2 puff, 2 puff, Inhalation, Q4H PRN, Mauri Brooklyn, MD  ???  amLODIPine (NORVASC) tablet 5 mg, 5 mg, Oral, Daily, Mauri Brooklyn, MD, 5 mg at 04/08/18 1721  ???  enoxaparin (LOVENOX) syringe 40 mg, 40 mg, Subcutaneous, Q24H SCH, Mauri Brooklyn, MD  ???  gabapentin (NEURONTIN) capsule 600 mg, 600 mg, Oral,  TID, Mauri Brooklyn, MD, 600 mg at 04/08/18 1721  ???  HYDROmorphone (DILAUDID) 50mg /24ml (1mg /ml) PCA CADD, , Intravenous, Continuous, Mauri Brooklyn, MD  ???  HYDROmorphone (PF) (DILAUDID) injection 2 mg, 2 mg, Intravenous, Q4H PRN, Mauri Brooklyn, MD, 2 mg at 04/08/18 1557  ???  lidocaine (LIDODERM) 5 % patch 1 patch, 1 patch, Transdermal, Q24H, Mauri Brooklyn, MD, 1 patch at 04/08/18 1723  ???  lidocaine-diphenhydramine-aluminum-magnesium (FIRST-MOUTHWASH BLM) mouthwash 10 mL, 10 mL, Mouth, 4x Daily PRN, Lillia Carmel, MD  ???  methadone (DOLOPHINE) tablet 10 mg, 10 mg, Oral, BID, Mauri Brooklyn, MD  ???  naloxone (NARCAN) injection 0.4 mg, 0.4 mg, Intravenous, Q5 Min PRN, Mauri Brooklyn, MD  ???  nicotine (NICODERM CQ) 14 mg/24 hr patch 1 patch, 1 patch, Transdermal, Daily, Mauri Brooklyn, MD  ???  nicotine polacrilex (NICORETTE) gum 2 mg, 2 mg, Buccal, Q1H PRN **OR** nicotine polacrilex (NICORETTE) lozenge 2 mg, 2 mg, Buccal, Q1H PRN, Mauri Brooklyn, MD  ???  ondansetron [facility]) injection 4 mg, 4 mg, Intravenous, Q6H PRN, Mauri Brooklyn, MD  ???  ondansetron (ZOFRAN-ODT) disintegrating tablet 8 mg, 8 mg, Oral, Q8H PRN, Mauri Brooklyn, MD  ???  [START ON 04/09/2018] pantoprazole (PROTONIX) EC tablet 20 mg, 20 mg, Oral, Daily, Mauri Brooklyn, MD  ???  polyethylene glycol (MIRALAX) packet 17 g, 17 g, Oral, Daily PRN, Mauri Brooklyn, MD  ???  predniSONE (DELTASONE) tablet 60 mg, 60 mg, Oral, Daily, Mauri Brooklyn, MD, 60 mg at 04/08/18 1721  ???  senna (SENOKOT) tablet 2 tablet, 2 tablet, Oral, BID, Mauri Brooklyn, MD   Allergies   No Known Allergies   ??   Social History Social History     Socioeconomic History   ??? Marital status: Single     Spouse name: Not on file   ??? Number of children: Not on file   ??? Years of education: Not on file   ??? Highest education level: Not on file   Occupational History   ??? Not on file   Social Needs   ??? Financial resource strain: Not on file   ??? Food insecurity:     Worry: Not on file     Inability: Not on file   ??? Transportation needs:     Medical: Not on file     Non-medical: Not on file   Tobacco Use   ??? Smoking status: Current Every Day Smoker     Packs/day: 1.00     Types: Cigarettes   ??? Smokeless tobacco: Never Used   Substance and Sexual Activity   ??? Alcohol use: Yes   ??? Drug use: Yes     Types: Cocaine   ??? Sexual activity: Not on file   Lifestyle   ??? Physical activity:     Days per week: Not on file     Minutes per session: Not on file   ??? Stress: Not on file   Relationships   ??? Social connections:     Talks on phone: Not on file     Gets together: Not on file     Attends religious service: Not on file     Active member of club or organization: Not on file     Attends meetings of clubs or organizations: Not on file     Relationship status: Not on file   Other Topics Concern   ??? Not on file   Social  History Narrative   ??? Not on file       Employment: on disability.  Prior ambulatory status: No limitations, community ambulator.   ??   Family History Family History   Problem Relation Age of Onset   ??? Brain cancer Paternal Uncle         ??     Review of Systems Denies chest pain, shortness of breath, fever, chills, headache, abdominal pain, nausea, or vomiting.       OBJECTIVE     Vitals:  Patient Vitals for the past 8 hrs:   BP Temp Temp src Pulse Resp SpO2 Height Weight   04/08/18 1521 173/90 36.7 ??C Oral 104 18 97 % 170.2 cm (5' 7.01) 89.8 kg (197 lb 15.6 oz)       Appearance: well-nourished and distressed   Oriented to: time, place and person  Affect: alert and cooperative    Spine Palpation: normal skin, normal muscle bulk, cervical, thoracic, and lumbar spine, diffusely tender to palpation    Right Upper Extremity  Inspection: no swelling, deformity, or effusion.   Compartments soft and compressible, no pain on passive stretch  Diffuse mild tenderness to palpation throughout extremity  Active ROM is painless at fingers, wrist, elbow, shoulder.   No crepitus with passive ROM of fingers, wrist, elbow, mild crepitus with passive range of motion of the shoulder, patient reports secondary to known arthritis.   Skin intact.   Motor: + OK (AIN median n), EPL (PIN radial n), IO (ulnar n)  SILT in medial, radial, and ulnar distributions.   + radial pulse with warm and well perfused digits.   ??  Left Upper Extremity  Inspection: no swelling, deformity, or effusion.   Compartments soft and compressible, no pain on passive stretch  Mild diffuse tenderness to palpation throughout extremity  Active ROM is painless at fingers, wrist, elbow, shoulder.   No crepitus with passive ROM of fingers, wrist, elbow, shoulder.   Skin intact.   Motor: + OK (AIN median n), EPL (PIN radial n), IO (ulnar n) SILT in medial, radial, and ulnar distributions.   + radial pulse with warm and well perfused digits.   ??  Right Lower Extremity  No swelling, deformity, or effusion.   Skin intact.  Diffuse severe tenderness to palpation throughout extremity.   Moderate pain with passive and active ROM at hip, knee, ankle.   No gross instability noted  + GS/TA/EHL.   SILT in DP/SP/S/S/P distributions.  + DP pulse with warm and well perfused toes.   ??  Left lower Extremity  No swelling, deformity, or effusion.   Skin intact.  Diffuse severe tenderness to palpation throughout extremity.   Moderate pain with passive and active ROM at hip, knee, ankle.   No gross instability noted  + GS/TA/EHL.   SILT in DP/SP/S/S/P distributions.  + DP pulse with warm and well perfused toes.   ??      Rectal Exam:Deferred, patient denies bowel or bladder incontinence    Sensation: Intact to light touch in the bilat hands and bilat feet.     Motor R L  Reflexes R L   Deltoid 4** 5  Tricep 1+ 1+   Bicep 5 5  Bicep 1-2+ 1-2+   Tricep 5 5  Brachiorad 1-2+ 1-2+   WE 5 5       Grip 5 5  Patellar 1-2+ 1-2+   IO 4 4  Achilles 1+ 1+   IP 3** 3**  Quad 3** 3**  Pathologic R L   TA 5 5  Hoffmann's neg. neg.   EHL 5 5  Babinski neg. neg.   GS 5 5  Clonus neg. neg.     **limited by pain    Test Results  Imaging  ?? CT abdomen and pelvis [place]. Date:Today.  Multiple osseus lesions to T and L spine with no obvious canal stenosis.       Labs  All lab results last 24 hours:    Recent Results (from the past 24 hour(s))   Basic Metabolic Panel    Collection Time: 04/08/18  3:11 AM   Result Value Ref Range    Sodium 138 135 - 145 mmol/L    Potassium 4.1 3.5 - 5.0 mmol/L    Chloride 105 98 - 107 mmol/L    CO2 29.0 22.0 - 30.0 mmol/L    BUN 22 (H) 7 - 21 mg/dL    Creatinine 1.61 (L) 0.70 - 1.30 mg/dL    BUN/Creatinine Ratio 41     EGFR MDRD Non Af Amer >=60 >=60 mL/min/1.32m2    EGFR MDRD Af Amer >=60 >=60 mL/min/1.23m2    Anion Gap 4 (L) 9 - 15 mmol/L    Glucose 87 65 - 179 mg/dL    Calcium 8.6 8.5 - 09.6 mg/dL   CBC w/ Differential    Collection Time: 04/08/18  3:11 AM   Result Value Ref Range    WBC 5.2 4.5 - 11.0 10*9/L    RBC 3.74 (L) 4.50 - 5.90 10*12/L    HGB 10.1 (L) 13.5 - 17.5 g/dL    HCT 04.5 (L) 40.9 - 53.0 %    MCV 84.0 80.0 - 100.0 fL    MCH 26.9 26.0 - 34.0 pg    MCHC 32.0 31.0 - 37.0 g/dL    RDW 81.1 (H) 91.4 - 15.0 %    MPV 7.1 7.0 - 10.0 fL    Platelet 264 150 - 440 10*9/L    Neutrophils % 81.7 %    Lymphocytes % 14.4 %    Monocytes % 2.5 %    Eosinophils % 0.4 %    Basophils % 0.2 %    Absolute Neutrophils 4.3 2.0 - 7.5 10*9/L    Absolute Lymphocytes 0.8 (L) 1.5 - 5.0 10*9/L    Absolute Monocytes 0.1 (L) 0.2 - 0.8 10*9/L    Absolute Eosinophils 0.0 0.0 - 0.4 10*9/L    Absolute Basophils 0.0 0.0 - 0.1 10*9/L    Large Unstained Cells 1 0 - 4 %    Microcytosis Slight (A) Not Present    Anisocytosis Moderate (A) Not Present   ECG 12 Lead    Collection Time: 04/08/18 11:52 AM   Result Value Ref Range    EKG Systolic BP  mmHg    EKG Diastolic BP  mmHg    EKG Ventricular Rate 103 BPM    EKG Atrial Rate 103 BPM    EKG P-R Interval 142 ms    EKG QRS Duration 92 ms    EKG Q-T Interval 326 ms    EKG QTC Calculation 427 ms    EKG Calculated P Axis 38 degrees    EKG Calculated R Axis 8 degrees    EKG Calculated T Axis 28 degrees    QTC Fredericia 390 ms         PROCEDURE(S)  none      Problem List  Principal Problem:    Cancer related pain  Active Problems:    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

## 2018-04-09 NOTE — Unmapped (Signed)
MRI of the spine was reviewed and demonstrates multiple likely metastatic lesions to the C,T, and L spine, as well as remote cervical compression fracture. No evidence of cord compression. No surgical intervention indicated. Recommend pain control per primary team, may followup as needed.

## 2018-04-09 NOTE — Unmapped (Signed)
VSS throughout shift. PCA escalated to continuous dose d/t increased pain. One time prn IV dilaudid given for breakthrough pain. Pt taken down to MRI but returned stating they were unable to do it. Morning K+ elevated, on call provider ordered stat EKG. Pt asymptomatic. Wctm.

## 2018-04-09 NOTE — Unmapped (Signed)
Oncology Consult Note    Requesting Attending Physician:  Lillia Carmel, MD  Service Requesting Consult: Med [facility])  Primary Oncologist: Dr. Salem Caster  Reason for Consult: Oral chemotherapy management    Assessment: Russell [name] is a 57 y.o. man with metastatic RCC who was admitted for intractable pain. Unfortunately, he has signs of disease progression and will therefore stop cabozantinib with plan to discuss next line of therapy as an outpatient. He was trialed on prednisone in April to see if this would help his cough, however he has been taking this inconsistently and with unclear benefit. Therefore, recommend tapering this off at this time.     Recommendations:   - Given progression of disease, will stop cabozantinib  - Plan to taper prednisone off. It sounds like he was most likely taking 40mg  daily at home for the past week or so, so recommend decreasing to 30mg  tomorrow and plan for rapid taper from there (I.e. Decrease by 10mg  q3 days until off). From an oncology perspective, it is ok to use dexamethasone if needed for bone pain  - Appreciate pall care recommendations regarding pain management  - Please notify the oncology consult team when patient is approaching discharge so that outpatient follow up can be scheduled.     This patient has been seen and discussed with Dr. Avis Epley. These recommendations were discussed with the primary team.     Please contact the oncology team at [phone] with any further questions.    Chauncey Mann, MD  Hematology-Oncology Fellow, PGY-4    -------------------------------------------------------------    HPI: Russell [name] is a 57 y.o. man with PMHx including metastatic RCC on cabozantinib and active cocaine abuse who is being seen at the request of Lillia Carmel, MD for evaluation of oral chemotherapy continuation.     Mr. Joos was seen by Dr. Legrand Como on 5/7 to discuss pain management. At that time he was switched from MS contin to methadone and continued oxycodone 10mg  q4hrs PRN. He then presented to [facility] ED on 5/8 with bilateral hip pain radiating down to his legs and was admitted for pain management. CT C/A/P was performed and showed worsening of multifocal bilateral lung nodules and new nodular septal thickening and worsening nodularity along the fissures, concerning for disease progression and possible lymphangitic spread. X-rays also showed extensive lesions throughout the femurs and R proximal tibia. He was subsequently transferred to [facility] main campus.     Regarding his steroids- he was advised to start a trial of pred 60mg  on 4/1 for cough to see if this was related to recurrent pneumonitis, however he did not take this. He saw Dr. Okey Dupre again on 4/15 and was again advised to start pred 60mg . He is not sure how much prednisone he has been taking, but thinks he was doing 2 pills (20mg  each) daily for at least the past week or so.     Onc history:  Summer 2018- p/w shoulder pain, found to have large renal mass with mets to lung, LN, bone. Had R lung collapse and rapid POD in lung LNs. Bx of RUL tumor with RCC  07/2017- chest XRT  07/22/17- ipi/nivo --> nivo x5, stopped d/t RT pneumonitis vs. nivo pneumonitis, treated with steroids  01/2018- XRT to new R femur and iliac mets  01/08/18- start cabozantinib, c/b HTN, hand foot syndrome, mouth sores    Review of Systems: All other systems reviewed and negative except as per HPI    Past  Medical History:   Diagnosis Date   ??? Bone metastasis (CMS-HCC)    ??? Renal cancer (CMS-HCC)        Past Surgical History:   Procedure Laterality Date   ??? FLEXIBLE BRONCHOSCOPY  07/24/2017   ??? HERNIA REPAIR         Family History   Problem Relation Age of Onset   ??? Brain cancer Paternal Uncle        Social History     Socioeconomic History   ??? Marital status: Single     Spouse name: Not on file   ??? Number of children: Not on file   ??? Years of education: Not on file   ??? Highest education level: Not on file   Occupational History   ??? Not on file   Social Needs   ??? Financial resource strain: Not on file   ??? Food insecurity:     Worry: Not on file     Inability: Not on file   ??? Transportation needs:     Medical: Not on file     Non-medical: Not on file   Tobacco Use   ??? Smoking status: Current Every Day Smoker     Packs/day: 1.00     Types: Cigarettes   ??? Smokeless tobacco: Never Used   Substance and Sexual Activity   ??? Alcohol use: Yes   ??? Drug use: Yes     Types: Cocaine   ??? Sexual activity: Not on file   Lifestyle   ??? Physical activity:     Days per week: Not on file     Minutes per session: Not on file   ??? Stress: Not on file   Relationships   ??? Social connections:     Talks on phone: Not on file     Gets together: Not on file     Attends religious service: Not on file     Active member of club or organization: Not on file     Attends meetings of clubs or organizations: Not on file     Relationship status: Not on file   Other Topics Concern   ??? Not on file   Social History Narrative   ??? Not on file       Allergies: has No Known Allergies.    Medications:   Meds:  ??? amLODIPine  5 mg Oral Daily   ??? enoxaparin (LOVENOX) injection  40 mg Subcutaneous Q24H SCH   ??? gabapentin  600 mg Oral TID   ??? lidocaine  1 patch Transdermal Q24H   ??? methadone  10 mg Oral BID   ??? nicotine  1 patch Transdermal Daily   ??? pantoprazole  20 mg Oral Daily   ??? predniSONE  60 mg Oral Daily   ??? senna  2 tablet Oral BID     Continuous Infusions:  ??? HYDROmorphone in 0.9 % NaCl       PRN Meds:.albuterol, HYDROmorphone, lidocaine-diphenhydramine-aluminum-magnesium, naloxone, nicotine polacrilex **OR** nicotine polacrilex, ondansetron, ondansetron, polyethylene glycol    Objective:   Vitals: Temp:  [36.6 ??C (97.8 ??F)-37.6 ??C (99.7 ??F)] 36.6 ??C (97.8 ??F)  Heart Rate:  [93-105] 95  Resp:  [17-18] 18  BP: (127-173)/(83-96) 134/83  SpO2:  [95 %-97 %] 95 %  BMI (Calculated):  [31] 31    Physical Exam:  GEN: Pleasant, comfortable appearing man in NAD  HEENT: sclerae anicteric, conjunctiva clear  CV: Normal rate, regular rhythm, normal S1 and S2, no murmurs, rubs,  or gallops;   RESP: normal work of breathing  GI: obese abdomen  EXT: extremities warm  SKIN: No rashes   NEURO: Alert and oriented, speech intact, following commands  PSYCH: Normal mood and appropriate affect    Test Results  Lab Results   Component Value Date    WBC 5.3 04/09/2018    HGB 11.2 (L) 04/09/2018    HCT 35.8 (L) 04/09/2018    PLT 330 04/09/2018       Lab Results   Component Value Date    NA 137 04/09/2018    K 5.6 (H) 04/09/2018    CL 98 04/09/2018    CO2 29.0 04/09/2018    BUN 18 04/09/2018    CREATININE 0.53 (L) 04/09/2018    GLU 127 04/09/2018    CALCIUM 8.5 04/09/2018    MG 2.0 02/23/2018    PHOS 3.5 02/23/2018       Lab Results   Component Value Date    BILITOT 0.5 04/09/2018    PROT 5.9 (L) 04/09/2018    ALBUMIN 3.1 (L) 04/09/2018    ALT 39 04/09/2018    AST 59 (H) 04/09/2018    ALKPHOS 170 (H) 04/09/2018       No results found for: LABPROT, INR, APTT      Imaging:   04/08/18 CT chest:  -- Interval worsening of multifocal bilateral lung nodules and nodularity along the bilateral major fissures as compared to the prior CT dated 03/16/2018; the primary differential consideration is for worsening metastatic disease.  --Interval appearance of nodular septal thickening and worsening nodularity along the fissures; raises the possibility of lymphangitic spread of disease.  -- Similar-appearing right lower lobe consolidation.  -- Unchanged mediastinal and bilateral hilar lymphadenopathy; concerning for nodal metastases.  --New trace bilateral pleural effusions.    04/08/18 CT A/P:  --No acute intra-abdominal pathology.  -- Redemonstration of right exophytic renal cell carcinoma with numerous osseous metastases including pathologic fracture of the right iliac wing.  -- Indeterminate 4 mm hypervascular focus in segment 3 of liver is unchanged. In the setting of renal cell carcinoma, this may represent a hepatic metastases, however, small size limits further evaluation. MRI may be helpful if clinically warranted.    04/08/18 X-ray femur, tibia/fibula  --No acute fracture or malalignment. Extensive metastatic lesions throughout the bilateral femora most prominent in the left proximal femur and distal right femur. Right distal femur lesion appears slightly more sclerotic.  --Right: Distal femur and proximal tibia osseous lesions, likely representing metastases. No evidence for acute fracture or pathologic fracture.  --Left: No fracture or malalignment. No lytic or sclerotic osseous lesion of the left tibia or fibula.

## 2018-04-09 NOTE — Unmapped (Signed)
Radiation Oncology Consult Note    Patient Name: Russell [name]  Patient Age: 57 y.o.  Encounter Date: 04/07/2018    Referring Physician:   Lillia Carmel, MD  Internal Medicine  .  Primary Care Provider: Ihor Austin, MD        Assessment:   Russell [name] is a 57 y.o. male with metastatic RCC on cabozantinib, admitted for worsening left hip pain radiating down his leg. Russell [name] has had prior radiation to the chest in August 2018 and to the right hip in Feb 2019.    Recommendations:  We discussed Russell [name] metastatic RCC. His pain is located primarily at his left hip. On review of imaging, Russell [name] does have bone metastases, including small bone metastases along his left pelvic bones and sacrum. However, these are relatively small and less likely to be the source of his pain. The largest bone metastases is on the contralateral pelvis where has already been treated.    Because his left pelvic lesions are fairly stable from imaging on 02/23/18 and widespread mets in general, it is difficult to localize a single source of pain from his metastatic cancer. There is no clearly targetable lesion(s). If we were to give radiation, therefore, we would have to use a larger, more encompasing field (without a clear target) and it may be of limited use. Further, this could get [place] into trouble at a later date, should Russell [name] develop a true bulky metastasis in the radiated area, which would then be in a setting where the options for additional radiation would be more limited. We had an honest discussion about this with the patient and ultimately the decision was made to not pursue radiation at this time.     - Hold on radiation for now  - Agree with palliative care team consult to optimize pain regimen      History of Present Illness:    Onc history:  Summer 2018- p/w shoulder pain, found to have large renal mass with mets to lung, LN, bone. Had R lung collapse and rapid POD in lung LNs. Bx of RUL tumor with RCC  07/2017- chest XRT  07/22/17- ipi/nivo --> nivo x5, stopped d/t RT pneumonitis vs. nivo pneumonitis, treated with steroids  01/2018- XRT to new R femur and iliac mets  01/08/18- start cabozantinib, c/b HTN, hand foot syndrome, mouth sores    Russell [name] presents as a transfer to [facility] to discuss pain management. Russell [name] reports that his pain at his left hip has resolved. Russell [name] reports worsening pain in his left pain, shooting, not worsened by movement, up to 10/10, radiating down to his foot. Worsened over the past month.     Russell [name] saw Dr. Legrand Como, who discontinued oxycontin and transitioned to methadone 10mg  BID. Russell [name] was continued on oxycodone 10mg  q4hrs prn.    Russell [name] does have a history of polysubstance use disorder including cocaine on recent Utox.    Right now his main concern is his continuing pain.    CT C/A/P notable for  - Worsening lung mets  - Right exophytic RCC  - Numerous osseous mets, most notable for a pathologic fracture of RIGHT iliac wing    MRI spine notable for disease nearly every level of vertebrate and pelvis. No cord compression.        Prior Radiation Therapy:yes lung and R hip  Pregnancy status: No; male patient    Review of Systems:  A 10 systems was negative except for pertinent positives noted in  HPI.     PAST MEDICAL HISTORY:  Past Medical History:   Diagnosis Date   ??? Bone metastasis (CMS-HCC)    ??? Renal cancer (CMS-HCC)        FAMILY HISTORY:  Family History   Problem Relation Age of Onset   ??? Brain cancer Paternal Uncle        SOCIAL HISTORY:   Social History     Socioeconomic History   ??? Marital status: Single     Spouse name: Not on file   ??? Number of children: Not on file   ??? Years of education: Not on file   ??? Highest education level: Not on file   Occupational History   ??? Not on file   Social Needs   ??? Financial resource strain: Not on file   ??? Food insecurity:     Worry: Not on file     Inability: Not on file   ??? Transportation needs:     Medical: Not on file     Non-medical: Not on file   Tobacco Use   ??? Smoking status: Current Every Day Smoker     Packs/day: 1.00     Types: Cigarettes   ??? Smokeless tobacco: Never Used   Substance and Sexual Activity   ??? Alcohol use: Yes   ??? Drug use: Yes     Types: Cocaine   ??? Sexual activity: Not on file   Lifestyle   ??? Physical activity:     Days per week: Not on file     Minutes per session: Not on file   ??? Stress: Not on file   Relationships   ??? Social connections:     Talks on phone: Not on file     Gets together: Not on file     Attends religious service: Not on file     Active member of club or organization: Not on file     Attends meetings of clubs or organizations: Not on file     Relationship status: Not on file   Other Topics Concern   ??? Not on file   Social History Narrative   ??? Not on file       No Known Allergies    Current Medications:  Current Facility-Administered Medications   Medication Dose Route Frequency Provider Last Rate Last Dose   ??? albuterol (PROVENTIL HFA;VENTOLIN HFA) 90 mcg/actuation inhaler 2 puff  2 puff Inhalation Q4H PRN Mauri Brooklyn, MD       ??? amLODIPine (NORVASC) tablet 5 mg  5 mg Oral Daily Mauri Brooklyn, MD   5 mg at 04/09/18 0811   ??? enoxaparin (LOVENOX) syringe 40 mg  40 mg Subcutaneous Q24H [facility] Mauri Brooklyn, MD       ??? gabapentin (NEURONTIN) capsule 600 mg  600 mg Oral TID Mauri Brooklyn, MD   600 mg at 04/09/18 1308   ??? HYDROmorphone (DILAUDID) 50mg /57ml (1mg /ml) PCA CADD   Intravenous Continuous Naseem Daphene Jaeger, MD       ??? HYDROmorphone (PF) (DILAUDID) injection 2 mg  2 mg Intravenous Q4H PRN Mauri Brooklyn, MD   2 mg at 04/08/18 1557   ??? lidocaine (LIDODERM) 5 % patch 1 patch  1 patch Transdermal Q24H Mauri Brooklyn, MD   1 patch at 04/08/18 1723   ??? lidocaine-diphenhydramine-aluminum-magnesium (FIRST-MOUTHWASH BLM) mouthwash 10 mL  10 mL Mouth 4x Daily PRN Lillia Carmel, MD       ??? methadone (  DOLOPHINE) tablet 10 mg  10 mg Oral BID Mauri Brooklyn, MD   10 mg at 04/09/18 1308   ??? naloxone (NARCAN) injection 0.4 mg 0.4 mg Intravenous Q5 Min PRN Mauri Brooklyn, MD       ??? nicotine (NICODERM CQ) 14 mg/24 hr patch 1 patch  1 patch Transdermal Daily Mauri Brooklyn, MD   1 patch at 04/09/18 1038   ??? nicotine polacrilex (NICORETTE) gum 2 mg  2 mg Buccal Q1H PRN Mauri Brooklyn, MD        Or   ??? nicotine polacrilex (NICORETTE) lozenge 2 mg  2 mg Buccal Q1H PRN Mauri Brooklyn, MD       ??? ondansetron [facility]) injection 4 mg  4 mg Intravenous Q6H PRN Mauri Brooklyn, MD       ??? ondansetron (ZOFRAN-ODT) disintegrating tablet 8 mg  8 mg Oral Q8H PRN Mauri Brooklyn, MD       ??? pantoprazole (PROTONIX) EC tablet 20 mg  20 mg Oral Daily Mauri Brooklyn, MD   20 mg at 04/09/18 1610   ??? polyethylene glycol (MIRALAX) packet 17 g  17 g Oral Daily PRN Mauri Brooklyn, MD       ??? predniSONE (DELTASONE) tablet 60 mg  60 mg Oral Daily Mauri Brooklyn, MD   60 mg at 04/09/18 0810   ??? senna (SENOKOT) tablet 2 tablet  2 tablet Oral BID Mauri Brooklyn, MD   2 tablet at 04/09/18 0810   ??? sodium polystyrene sulfonate (SPS) oral suspension  30 g Oral Once Lillia Carmel, MD   Stopped at 04/09/18 0925         Physical Exam:   Vitals:    04/09/18 1204   BP: 149/83   Pulse: 65   Resp: 18   Temp: 36.2 ??C (97.2 ??F)   SpO2: 97%     ECOG: 1  General: Well-developed, no acute distress  Skin: No lesions or rashes  Neuro: Alert and oriented to conversation  HEENT:Normocephalic, moist mucous membranes  Eyes: EOMI  Cardio: Normal rate.  Respiratory: Breathing comfortably on room air  GI: Non-distended   MSK: Tender to touch at left hip  Psych: Normal mood and affect.  Converses clearly and emotionally appropriate.       RADIOLOGY: Personally reviewed.  See HPI    PATHOLOGY: Personally reviewed. See HPI    Labs:      Lab Results   Component Value Date    WBC 5.3 04/09/2018    WBC 5.2 04/08/2018    WBC 6.7 04/06/2018    HGB 11.2 (L) 04/09/2018    HGB 10.1 (L) 04/08/2018    HGB 11.2 (L) 04/06/2018    HCT 35.8 (L) 04/09/2018 HCT 31.4 (L) 04/08/2018    HCT 35.2 (L) 04/06/2018    PLT 330 04/09/2018    PLT 264 04/08/2018    PLT 318 04/06/2018    CREATININE 0.53 (L) 04/09/2018    CREATININE 0.54 (L) 04/08/2018    CREATININE 0.57 (L) 04/06/2018    AST 59 (H) 04/09/2018    ALT 39 04/09/2018         **I have reviewed all outside records as it pertains to patient's care.**      Lonzo Cloud, MD, MBA  Resident Physician PGY-3  Department of Radiation Oncology    I saw and evaluated and examined the patient, participating in the key portions of the service. I discussed the findings,  assessment, and plan of care with the provider and patient. I agree with the findings and plan as documented in the note.     Trena Platt MD MS MPH  Assistant Professor  Department of Radiation Oncology  [place] [facility] [place] at [facility]

## 2018-04-09 NOTE — Unmapped (Signed)
ORTHOPAEDIC PROGRESS NOTE      ASSESSMENT:  Russell [name] is a 57 y.o. male with metastatic RCC, with known mets to right ilium, bilateral femurs, right tibia and spine who is admitted with increased LLE pain    PLAN:  -Describes sharp, radiating pain from left lower back down the posterior aspect of his left thigh. Recommend completion spine MRI, including cervical, thoracic and lumbar to rule out severe stenosis from mets  -discussed BLE lesions with Dr Darral Dash, orthopedic tumor specialist who recommends observation with WBAT with walker  - no orthopedic planned surgical interventions for BLE mets. Continue medical management of RCC  -----------------------------------------------------------------------------------------------  - Current orthopaedic contact resident: Lovett Calender  - Current orthopaedic care attending: Cavanaugh/Olcott  - On nights (6pm-6am), weekends, and holidays, please page Orthopaedic Consult pager.    SUBJECTIVE:  Reports 1 months history of sharp, radiating pain from left lower back and buttock to the back of his left knee. Aggravated with movement    OBJECTIVE:  PE:  BP 134/83  - Pulse 95  - Temp 36.6 ??C  - Resp 18  - Ht 170.2 cm (5' 7.01)  - Wt 89.8 kg (197 lb 15.6 oz)  - SpO2 95%  - BMI 31.00 kg/m??       Motor R L ??   Deltoid 4** 5 ??   Bicep 5 5 ??   Tricep 5 5 ??   WE 5 5 ??   Grip 5 5 ??   IO 4 4 ??   IP 3** 3** ??   Quad 4+ 4-** ??   TA 5 5 ??   EHL 5 5 ??   GS 5 5      ** limited by effort, pain    SILT to light touch in hands and feet

## 2018-04-09 NOTE — Unmapped (Signed)
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 *        Pressure Ulcer(s)    Active Pressure Ulcer     None                 Russell [name] is a 57 y.o. male who presented to [facility] with Cancer related pain.  ??  Cancer related pain: Opioid refractory pain in the setting of metastatic RCC. Patient takes 90 OME daily and 20mg  methadone daily.  Palliative Care consult for guidance on pain mgmt. No radiation at this time due to widespread dz per XRT.    - dilaudid PCA 0.2 mg/hr + 0.2 mg q 10 min demand-->0.4 mg/hr + 0.4 mg q10 demand  - continue methadone 10 bid, gabapentin 600 tid, lidoderm patch  - ACP and PC SW to help with completion of HCPOA. (Dr. Mort Sawyers discussed with Dr. Molli Hazard)  ??  Diffusely Metastatic Renal Cell Carcinoma. Unfortunately, disease progression noted.  - hold cabozantinib and wean prednisone per Onc recs   - prednisone 30 daily, taper by 10 mg every 3 days to OFF  - continue further conversations re: GOC  ??  Polysubstance Use Disorder. UDS positive for cocaine and opiates. Palliative Care team aware.   ??  Hyperkalemia. Kayexalate for K 5.6, recheck in AM  Constipation. Senna 2  tabs BID, polyethylene PRN   Leukoplakia.  Magic mouthwash q6h prn   Tobacco Use. Nicotine patch ordered   HTN: amlodipine 5  ??  F/E/N:  Regular diet  Prophylaxis: lovenox 40??  Disposition: Home when pain controlled    5/9: 35 minutes in overall patient care >50% in counseling and coordination of care including consultant's recs, PCA adjustments, multiple MRI needs for pain meds  ___________________________________________________________________    Subjective:  Mult adjustments made to pain regimen overnight, mainly addition of continuous PCA rate as pt had difficulty understanding requirements of demand dosing. Required mult additional doses IV dilaudid when off PCA for MRI. XRT, Pall Care consulted for pain mgmt, Onc for guidance on home chemo med, XRT for consideration of palliative xrt, discussed recs w/ all consults. Pt states current pain most prominent L hip/leg, current pain 6/10 w/ goal 4-5/10. Regular BMs. Kayexalate provided but pt has not taken yet.     Recent Results (from the past 24 hour(s))   Comprehensive metabolic panel    Collection Time: 04/09/18  4:16 AM   Result Value Ref Range    Sodium 137 135 - 145 mmol/L    Potassium 5.6 (H) 3.5 - 5.0 mmol/L    Chloride 98 98 - 107 mmol/L    CO2 29.0 22.0 - 30.0 mmol/L    BUN 18 7 - 21 mg/dL    Creatinine 1.61 (L) 0.70 - 1.30 mg/dL    BUN/Creatinine Ratio 34     EGFR MDRD Non Af Amer >=60 >=60 mL/min/1.46m2    EGFR MDRD Af Amer >=60 >=60 mL/min/1.32m2    Anion Gap 10 9 - 15 mmol/L    Glucose 127 65 - 179 mg/dL    Calcium 8.5 8.5 - 09.6 mg/dL    Albumin 3.1 (L) 3.5 - 5.0 g/dL    Total Protein  5.9 (L) 6.5 - 8.3 g/dL    Total Bilirubin 0.5 0.0 - 1.2 mg/dL    AST 59 (H) 19 - 55 U/L    ALT 39 19 - 72 U/L    Alkaline Phosphatase 170 (H) 38 - 126 U/L   CBC w/ Differential    Collection Time: 04/09/18  4:16 AM   Result Value Ref Range    WBC 5.3 4.5 - 11.0 10*9/L    RBC 4.25 (L) 4.50 - 5.90 10*12/L    HGB 11.2 (L) 13.5 - 17.5 g/dL    HCT 09.8 (L) 11.9 - 53.0 %    MCV 84.2 80.0 - 100.0 fL    MCH 26.3 26.0 - 34.0 pg    MCHC 31.3 31.0 - 37.0 g/dL    RDW 14.7 (H) 82.9 - 15.0 %    MPV 7.4 7.0 - 10.0 fL    Platelet 330 150 - 440 10*9/L    Neutrophils % 85.3 %    Lymphocytes % 11.2 %    Monocytes % 2.6 %    Eosinophils % 0.2 %    Basophils % 0.2 %    Neutrophil Left Shift 1+ (A) Not Present    Absolute Neutrophils 4.5 2.0 - 7.5 10*9/L    Absolute Lymphocytes 0.6 (L) 1.5 - 5.0 10*9/L    Absolute Monocytes 0.1 (L) 0.2 - 0.8 10*9/L    Absolute Eosinophils 0.0 0.0 - 0.4 10*9/L    Absolute Basophils 0.0 0.0 - 0.1 10*9/L    Large Unstained Cells 1 0 - 4 %    Microcytosis Slight (A) Not Present    Anisocytosis Slight (A) Not Present    Hypochromasia Moderate (A) Not Present   ECG 12 Lead    Collection Time: 04/09/18  6:04 AM   Result Value Ref Range    EKG Systolic BP  mmHg    EKG Diastolic BP  mmHg    EKG Ventricular Rate 91 BPM    EKG Atrial Rate 91 BPM    EKG P-R Interval 146 ms    EKG QRS Duration 92 ms    EKG Q-T Interval 366 ms    EKG QTC Calculation 450 ms    EKG Calculated P Axis 35 degrees    EKG Calculated R Axis -5 degrees    EKG Calculated T Axis 10 degrees    QTC Fredericia 420 ms     Labs/Studies:  Labs per EMR and Reviewed (last 24hrs)    Objective:  Temp:  [36.2 ??C (97.2 ??F)-37.3 ??C (99.1 ??F)] 36.2 ??C (97.2 ??F)  Heart Rate:  [65-105] 65  Resp:  [17-18] 18  BP: (127-173)/(83-95) 149/83  SpO2:  [95 %-97 %] 97 %  Gen: appears overall comfortable lying in bed, NAD  ENT: MMM  CV: RRR  Pulm: CTAB, normal WOB  Abd: NABS, soft, nontend  Ext: normal DP pulses, no edema

## 2018-04-09 NOTE — Unmapped (Signed)
Care Management  Initial Transition Planning Assessment              General  Care Manager assessed the patient by : In person interview with patient, Medical record review, Discussion with Clinical Care team(met with pt, mom and two friends at bedside)  Orientation Level: Oriented X4    Contact/Decision Maker        Extended Emergency Contact Information  Primary Emergency Contact: Fredrik Rigger [place]  Home Phone: [phone]  Relation: Friend  Secondary Emergency Contact: [name]  Mobile Phone: [phone]  Relation: Friend    Legal Next of Kin / Guardian / POA / Advance Directives     HCDM (patient stated preference): Nikki Dom [phone]    HCDM (patient stated preference): [name] - Friend - [phone]    Advance Directive (Medical Treatment)  Does patient have an advance directive covering medical treatment?: Patient does not have advance directive covering medical treatment., Patient would like information.(Pt reports he wants [name] as his HCPOA [phone])  Reason patient does not have an advance directive covering medical treatment:: Patient needs follow-up to complete one.  Information provided on advance directive:: Yes(I told patient how to contact patient relations to notarize)  Patient requests assistance:: No         Patient Information  Lives with: Stacey Drain w/ friend Cay Schillings [phone])    Type of Residence: Private residence          [place] of Residence[place]  [address] [place] [address]  Contacts:    Patient Phone Number: c: [phone]        Medical Provider(s): Marc Morgans ROSE, MD  Reason for Admission: Admitting Diagnosis:  No admission diagnoses are documented for this encounter.  Past Medical History:   has a past medical history of Bone metastasis (CMS-HCC) and Renal cancer (CMS-HCC).  Past Surgical History:   has a past surgical history that includes Flexible bronchoscopy (07/24/2017) and Hernia repair.   Previous admit date: N/A    Primary Insurance- Payor: MEDICAID [place] / Plan: MEDICAID [place] ACCESS / Product Type: *No Product type* /   Secondary Insurance ??? Secondary Insurance  MEDICAID [place] CARDI*  Prescription Coverage ??? Medicaid  Preferred Pharmacy - [facility] - [place], [place] - [address]  [facility] - [place], [place] - [address]  [facility] [place] - [address]    Transportation home: Private vehicle  Level of function prior to admission: Independent               Responsibilities/Dependents at home?: No    Home Care services in place prior to admission?: No                  Equipment Currently Used at Home: none       Currently receiving outpatient dialysis?: No       Financial Information       Need for financial assistance?: No(Pt on SSI)         Discharge Needs Assessment  Concerns to be Addressed: denies needs/concerns at this time    Clinical Risk Factors: Principal Diagnosis: Cancer, Stroke, COPD, Heart Failure, AMI, Pneumonia, Joint Replacment    Barriers to taking medications: No    Prior overnight hospital stay or ED visit in last 90 days: No    Readmission Within  the Last 30 Days: no previous admission in last 30 days         Anticipated Changes Related to Illness: none    Equipment Needed After Discharge: none    Discharge Facility/Level of Care Needs: (home)    Readmission  Risk of Unplanned Readmission Score: UNPLANNED READMISSION SCORE: 24%  Readmitted Within the Last 30 Days?   Patient at risk for readmission?: Yes    Discharge Plan  Screen findings are: Care Manager reviewed the plan of the patient's care with the Multidisciplinary Team. No discharge planning needs identified at this time. Care Manager will continue to manage plan and monitor patient's progress with the team.    Expected Discharge Date: 04/12/18      Patient and/or family were provided with choice of facilities / services that are available and appropriate to meet post hospital care needs?: N/A       Initial Assessment complete?: Yes

## 2018-04-09 NOTE — Unmapped (Signed)
Palliative Care Consult Note    Consultation from Requesting Attending Physician:  Lillia Carmel, MD  Service Requesting Consult:  [facility]  Reason for Consult Request from Attending Physician:  Evaluation of Symptoms, Goals of Care / Decision Making and Patient and Family Support  Primary Care Provider:  Marc Morgans ROSE, MD    Code Status: Full Code  Advance Directive Status: Nil    Assessment/Recommendation:   SUMMARY: Mr. Chiriboga is a 57 y/o male with metastatic RCC with diffusely metastatic disease to the bone s/p palliative XRT to the right femur and iliac mets, who has evidence of disease progression on Cabozantinib admitted for worsening left sided hip pain    1. Symptom Assessment and Recommendations:      1) Left Hip Pain: The pain is sharp and radiates to the left knee. He is known to have metastatic disease to the bilateral femurs and no further clear site amenable to palliative XRT. The patient had a transition from his long acting MS Contin 60mg  BID (it is unclear if he was possibly taking TID at times) to Methadone on 5/7, which coincided with the exacerbation of his pain. He states that he had started taking his Methadone as prescribed starting on 5/7 pm. Ideally Methadone is best titrated on a 5 day interval and we can possibly look for an increase dose on 5/10-5/11.  - Please continue Methadone 10mg  BID (started 5/7)  - Dilaudid PCA -   - Please increase basal to 0.4mg /hr   - Please increase bolus to 0.4mg  every  - Continue Gabapentin 600mg  TID    2. Substance Misuse: The patient last used cocaine on 04/06/18 and has had a long conversation with Dr. Legrand Como about the concern for opioid misuse in the setting of positive urine toxicology. This has prompted his change to Methadone.    2. Communication and ACP:  -- Prognosis and Understanding: The patient is aware about the metastatic nature of his cancer diagnosis. He states he has been very care free in his youth and now is concerned about cancer being the issue that is life limiting.    -- Goals of Care and Decision Making:   Decision Maker at Visit: Patient. His main goal is to improve his pain relief to a point he is able to mobilize with less discomfort. He is interested in ongoing options for cancer directed therapies. He loves to play pool and would like to play once more. He has identified [name] and [name] (friends of his) as his HCPOA and would like to formalize this on this admission.      Thank you for this consult. Please page  [phone]  or Palliative Care [phone]) if there are any questions.     Subjective:    History of Present Illness:      Mr. Vinsant is a 57 y/o male with metastatic RCC with diffuse osseous and pulmonary metastases with evidence of progression of disease on Cabozantinib. He was reviewed for persistent and worsening pain in his left hip on 5/7 by Dr. Legrand Como where he had a rapid switch from MS Contin 60mg  BID to Methadone. He states his pain is mostly located in his left hip and is sharp and radiating to the left knee. This is similar in nature to the pain he has had for 1 month, however has become more noticeable in the past 2 weeks and specifically since transitioning from MS Contin to Methadone.    He  had more surveillance imaging within the ED with CT C/A/P with evidence of progressive osseous disease. He had further MRI spine with no evidence of fracture or spinal cord compression.    Symptom Severity and Assessment:  (0 = No symptom --> 10 = Most Severe)    Pain severity: On arrival pain was 10/10 and is currently 4/10 and has improved with the initiation of Dilaudid PCA.    Location: Right hip and radiates to the right knee     Description: Sharp and gnawing in nature    Duration: Has been present for 1 month and recently exacerbated    Frequency: Was persistent and severe on arri    Makes better / makes worse: Particularly worsened by sitting forward     Affect on function / quality of life: Reduced mobility.      Shortness of Breath: Denies  Secretions/Congestion: Denies  Nausea/Vomiting: Denies  Constipation: Has irregular bowel movements  Fatigue: Currently affected by the pain, was not affecting his quality of life at home  Sleep: Only an issue when pain is   Anxiety: Has been using cocaine intermittently, denies anxiety as an issue at present  Other:   - Substance misuse: Last used cocaine on 04/06/18.      Allergies:  No Known Allergies    Current Facility-Administered Medications   Medication Dose Route Frequency Provider Last Rate Last Dose   ??? albuterol (PROVENTIL HFA;VENTOLIN HFA) 90 mcg/actuation inhaler 2 puff  2 puff Inhalation Q4H PRN Mauri Brooklyn, MD       ??? amLODIPine (NORVASC) tablet 5 mg  5 mg Oral Daily Mauri Brooklyn, MD   5 mg at 04/09/18 0811   ??? enoxaparin (LOVENOX) syringe 40 mg  40 mg Subcutaneous Q24H [facility] Mauri Brooklyn, MD       ??? gabapentin (NEURONTIN) capsule 600 mg  600 mg Oral TID Mauri Brooklyn, MD   600 mg at 04/09/18 0810   ??? HYDROmorphone (DILAUDID) 50mg /47ml (1mg /ml) PCA CADD   Intravenous Continuous Naseem Daphene Jaeger, MD       ??? HYDROmorphone (PF) (DILAUDID) injection 2 mg  2 mg Intravenous Q4H PRN Mauri Brooklyn, MD   2 mg at 04/08/18 1557   ??? HYDROmorphone (PF) (DILAUDID) injection 2 mg  2 mg Intravenous Q1H PRN Lillia Carmel, MD   2 mg at 04/09/18 1610   ??? lidocaine (LIDODERM) 5 % patch 1 patch  1 patch Transdermal Q24H Mauri Brooklyn, MD   1 patch at 04/08/18 1723   ??? lidocaine-diphenhydramine-aluminum-magnesium (FIRST-MOUTHWASH BLM) mouthwash 10 mL  10 mL Mouth 4x Daily PRN Lillia Carmel, MD       ??? methadone (DOLOPHINE) tablet 10 mg  10 mg Oral BID Mauri Brooklyn, MD   10 mg at 04/09/18 9604   ??? naloxone [facility]) injection 0.4 mg  0.4 mg Intravenous Q5 Min PRN Mauri Brooklyn, MD       ??? nicotine (NICODERM CQ) 14 mg/24 hr patch 1 patch  1 patch Transdermal Daily Mauri Brooklyn, MD   1 patch at 04/09/18 1038   ??? nicotine polacrilex (NICORETTE) gum 2 mg  2 mg Buccal Q1H PRN Mauri Brooklyn, MD        Or   ??? nicotine polacrilex (NICORETTE) lozenge 2 mg  2 mg Buccal Q1H PRN Mauri Brooklyn, MD       ??? ondansetron [facility]) injection 4 mg  4 mg Intravenous Q6H PRN Lorne Skeens  McEntee, MD       ??? ondansetron (ZOFRAN-ODT) disintegrating tablet 8 mg  8 mg Oral Q8H PRN Mauri Brooklyn, MD       ??? pantoprazole (PROTONIX) EC tablet 20 mg  20 mg Oral Daily Mauri Brooklyn, MD   20 mg at 04/09/18 1610   ??? polyethylene glycol (MIRALAX) packet 17 g  17 g Oral Daily PRN Mauri Brooklyn, MD       ??? predniSONE (DELTASONE) tablet 60 mg  60 mg Oral Daily Mauri Brooklyn, MD   60 mg at 04/09/18 0810   ??? senna (SENOKOT) tablet 2 tablet  2 tablet Oral BID Mauri Brooklyn, MD   2 tablet at 04/09/18 0810   ??? sodium polystyrene sulfonate (SPS) oral suspension  30 g Oral Once Lillia Carmel, MD   Stopped at 04/09/18 [phone]       Past Medical History:   Diagnosis Date   ??? Bone metastasis (CMS-HCC)    ??? Renal cancer (CMS-HCC)        Past Surgical History:   Procedure Laterality Date   ??? FLEXIBLE BRONCHOSCOPY  07/24/2017   ??? 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  Hobbies: Previously played softball, likes to shoot pool at bars   Current residence / distance from [facility]: lives in [place]  ??          Family History:   family history includes Brain cancer in his paternal uncle.    Review of Systems:  A 12 system review of systems was negative except as noted in HPI.    Objective:     Function:  70% - Ambulation: Reduced / unable to do normal work, some evidence of disease / Self-Care: Full / Intake: Normal or reduced / Level of Conscious: Full    Temp:  [36.6 ??C (97.8 ??F)-37.6 ??C (99.7 ??F)] 36.6 ??C (97.8 ??F)  Heart Rate: [93-105] 95  Resp:  [17-18] 18  BP: (127-173)/(83-96) 134/83  SpO2:  [95 %-97 %] 95 %    No intake/output data recorded.    Physical Exam:  PHYSICAL EXAM  Vitals:    04/09/18 1204   BP: 149/83   Pulse: 65   Resp: 18   Temp: 36.2 ??C (97.2 ??F)   SpO2: 97%     GEN: Awake and alert, pleasant appearing male intermittently somnolent  HEENT: Pupils equally round without scleral icterus  LYMPH: No palpable cervical or clavicular lymph nodes  CV: Normal rate and not tachycardic  LUNGS: Clear to auscultation bilaterally without wheeze or rhonchi  SKIN: No rashes, petechiae or jaundice noted  ABD: Soft and non-tender with no distention or palpable masses. Bowel sounds are active  [facility]: Alert and oriented to person, place and time  NEURO: Power 5/5 bilaterally lower extremity and normal sensation  EXT: Has tenderness to palpation at the left knee      Test Results:  Lab Results   Component Value Date    WBC 5.3 04/09/2018    RBC 4.25 (L) 04/09/2018    HGB 11.2 (L) 04/09/2018    HCT 35.8 (L) 04/09/2018    MCV 84.2 04/09/2018    MCH 26.3 04/09/2018    MCHC 31.3 04/09/2018    RDW 17.9 (H) 04/09/2018    PLT 330 04/09/2018  Lab Results   Component Value Date    NA 137 04/09/2018    K 5.6 (H) 04/09/2018    CL 98 04/09/2018    CO2 29.0 04/09/2018    BUN 18 04/09/2018    CREATININE 0.53 (L) 04/09/2018    GLU 127 04/09/2018    CALCIUM 8.5 04/09/2018    ALBUMIN 3.1 (L) 04/09/2018    PHOS 3.5 02/23/2018      Lab Results   Component Value Date    ALKPHOS 170 (H) 04/09/2018    BILITOT 0.5 04/09/2018    PROT 5.9 (L) 04/09/2018    ALBUMIN 3.1 (L) 04/09/2018    ALT 39 04/09/2018    AST 59 (H) 04/09/2018       Imaging:     CT Abdomen:  - Redemonstration of right exophytic renal cell carcinoma with numerous osseous metastases including pathologic fracture of the right iliac wing.  -- Indeterminate 4 mm hypervascular focus in segment 3 of liver is unchanged. In the setting of renal cell carcinoma, this may represent a hepatic metastases, however, small size limits further evaluation. MRI may be helpful if clinically warranted.    CT Chest:  - Interval appearance of nodular septal thickening and worsening nodularity along the fissures; raises the possibility of lymphangitic spread of disease.  -- Similar-appearing right lower lobe consolidation.  -- Unchanged mediastinal and bilateral hilar lymphadenopathy; concerning for nodal metastases.  --New trace bilateral pleural effusions.    MRI:  Extensive widespread osseous metastatic disease.  --No acute compression fracture.  --No canal stenosis.  --Unchanged nonacute C7 compression deformity.      Total time spent with patient for evaluation & management: 1 Hour  Start time - stop time:    Greater than 50% time spent on counseling/coordination of care:  Yes.   See ACP Note from today for additional billable service:  No.

## 2018-04-10 LAB — CBC
HEMATOCRIT: 31.4 % — ABNORMAL LOW (ref 41.0–53.0)
HEMOGLOBIN: 10.3 g/dL — ABNORMAL LOW (ref 13.5–17.5)
MEAN CORPUSCULAR HEMOGLOBIN CONC: 32.8 g/dL (ref 31.0–37.0)
MEAN CORPUSCULAR HEMOGLOBIN: 27.4 pg (ref 26.0–34.0)
MEAN PLATELET VOLUME: 7.6 fL (ref 7.0–10.0)
PLATELET COUNT: 286 10*9/L (ref 150–440)
RED BLOOD CELL COUNT: 3.76 10*12/L — ABNORMAL LOW (ref 4.50–5.90)
RED CELL DISTRIBUTION WIDTH: 17.8 % — ABNORMAL HIGH (ref 12.0–15.0)
WBC ADJUSTED: 5.6 10*9/L (ref 4.5–11.0)

## 2018-04-10 LAB — COMPREHENSIVE METABOLIC PANEL
ALKALINE PHOSPHATASE: 161 U/L — ABNORMAL HIGH (ref 38–126)
ALT (SGPT): 41 U/L (ref 19–72)
ANION GAP: 9 mmol/L (ref 9–15)
AST (SGOT): 61 U/L — ABNORMAL HIGH (ref 19–55)
BILIRUBIN TOTAL: 0.3 mg/dL (ref 0.0–1.2)
BLOOD UREA NITROGEN: 20 mg/dL (ref 7–21)
BUN / CREAT RATIO: 38
CALCIUM: 9.2 mg/dL (ref 8.5–10.2)
CHLORIDE: 97 mmol/L — ABNORMAL LOW (ref 98–107)
CO2: 29 mmol/L (ref 22.0–30.0)
CREATININE: 0.53 mg/dL — ABNORMAL LOW (ref 0.70–1.30)
EGFR MDRD AF AMER: >=60 mL/min/{1.73_m2} (ref >=60)
EGFR MDRD NON AF AMER: >=60 mL/min/{1.73_m2} (ref >=60)
GLUCOSE RANDOM: 99 mg/dL (ref 65–179)
POTASSIUM: 4.5 mmol/L (ref 3.5–5.0)
PROTEIN TOTAL: 5.7 g/dL — ABNORMAL LOW (ref 6.5–8.3)
SODIUM: 135 mmol/L (ref 135–145)

## 2018-04-10 LAB — WBC ADJUSTED: Lab: 5.6

## 2018-04-10 LAB — MAGNESIUM: Magnesium:MCnc:Pt:Ser/Plas:Qn:: 2

## 2018-04-10 LAB — AST (SGOT): Aspartate aminotransferase:CCnc:Pt:Ser/Plas:Qn:: 61 — ABNORMAL HIGH

## 2018-04-10 NOTE — Unmapped (Signed)
PCA maintained throughout the shift, pt still endorsing LLE pain. UOOB to chair, positioning encouraged to reduce pain. Hypertensive early this morning, other VSS throughout shift. Denies current needs, wctm.     Problem: Adult Inpatient Plan of Care  Goal: Plan of Care Review  Outcome: Progressing  Flowsheets  Taken 04/09/2018 0642  Plan of Care Reviewed With: patient  Taken 04/10/2018 0535  Progress: no change  Goal: Patient-Specific Goal (Individualization)  Outcome: Progressing  Flowsheets (Taken 04/09/2018 1610)  Individualized Care Needs: Cluster care, pain management  Goal: Absence of Hospital-Acquired Illness or Injury  Outcome: Progressing  Goal: Optimal Comfort and Wellbeing  Outcome: Progressing  Goal: Readiness for Transition of Care  Outcome: Progressing  Goal: Rounds/Family Conference  Outcome: Progressing     Problem: Fall Injury Risk  Goal: Absence of Fall and Fall-Related Injury  Outcome: Progressing

## 2018-04-10 NOTE — Unmapped (Signed)
Pt afebrile, VSS, no falls/injuries this shift.  Pt ambulating well from bed to bathroom despite leg pain.  Pt continues with PCA for pain control - dose adjusted for better coverage - pt reports satisfaction with increase.  Pt received kayexolate at 0700, but pt resistant to finishing it all despite RN nagging.  Pt reports having BMs.  Second dose ordered at 0900, but held because patient is still working on finishing 0700 dose (note written at 17:55).  CTM

## 2018-04-10 NOTE — Unmapped (Signed)
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 *        Pressure Ulcer(s)    Active Pressure Ulcer     None                 Russell [name] is a 57 y.o. male who presented to [facility] with Cancer related pain.  ??  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  Palliative Care consult for guidance on pain mgmt. No radiation at this time due to widespread dz per XRT.  Given patient's atypical pain complaints, palliative care will discuss imaging further with radiology to see if a more localized intervention may be possible  - dilaudid PCA 0.2 mg/hr + 0.2 mg q 10 min demand-->0.4 mg/hr + 0.4 mg q10 demand  - continue methadone 10 BID  - gabapentin 600 tid --> 900 TID   - lidoderm patch  - added scheduled Tylenol 1G q6 (will decrease to 1G TID at discharge)  - ACP and PC SW to help with completion of HCPOA  ??  Diffusely Metastatic Renal Cell Carcinoma. Unfortunately, disease progression noted.  Oncology aware of admission.  Appreciate palliative care assistance as above.   - have stopped cabozantinib given disease progression  - prednisone 30 daily taper by 10 mg every 3 days to OFF (per oncology)  - continue further conversations re: GOC   - page oncology at discharge for outpatient follow up  ??  Polysubstance Use Disorder. UDS positive for cocaine and opiates. Palliative Care team aware.   ??  Hyperkalemia (resolved): Received Kayexalate for K 5.6 on 5/10; improved on recheck     Constipation (resolved):  Reports daily BM  - Continue Senna 2  tabs BID, polyethylene PRN     Leukoplakia: Present on admission  -  Magic mouthwash q6h prn     Tobacco Use:    - Nicotine patch, nicotine gum PRN     HTN:   - Continue home amlodipine  ??  F/E/N:  Regular diet  Prophylaxis: lovenox 40??  Disposition: Home when pain controlled    ___________________________________________________________________    Subjective:  Pain worse early in the morning still not well controlled.     Recent Results (from the past 24 hour(s))   CBC    Collection Time: 04/10/18  5:08 AM   Result Value Ref Range    WBC 5.6 4.5 - 11.0 10*9/L    RBC 3.76 (L) 4.50 - 5.90 10*12/L    HGB 10.3 (L) 13.5 - 17.5 g/dL    HCT 16.1 (L) 09.6 - 53.0 %    MCV 83.5 80.0 - 100.0 fL    MCH 27.4 26.0 - 34.0 pg    MCHC 32.8 31.0 - 37.0 g/dL    RDW 04.5 (H) 40.9 - 15.0 %    MPV 7.6 7.0 - 10.0 fL    Platelet 286 150 - 440 10*9/L   Comprehensive Metabolic Panel    Collection Time: 04/10/18  5:08 AM   Result Value Ref Range    Sodium 135 135 - 145 mmol/L    Potassium 4.5 3.5 - 5.0 mmol/L  Chloride 97 (L) 98 - 107 mmol/L    CO2 29.0 22.0 - 30.0 mmol/L    BUN 20 7 - 21 mg/dL    Creatinine 1.61 (L) 0.70 - 1.30 mg/dL    BUN/Creatinine Ratio 38     EGFR MDRD Non Af Amer >=60 >=60 mL/min/1.10m2    EGFR MDRD Af Amer >=60 >=60 mL/min/1.79m2    Anion Gap 9 9 - 15 mmol/L    Glucose 99 65 - 179 mg/dL    Calcium 9.2 8.5 - 09.6 mg/dL    Albumin 3.1 (L) 3.5 - 5.0 g/dL    Total Protein 5.7 (L) 6.5 - 8.3 g/dL    Total Bilirubin 0.3 0.0 - 1.2 mg/dL    AST 61 (H) 19 - 55 U/L    ALT 41 19 - 72 U/L    Alkaline Phosphatase 161 (H) 38 - 126 U/L   Magnesium Level    Collection Time: 04/10/18  5:08 AM   Result Value Ref Range    Magnesium 2.0 1.6 - 2.2 mg/dL     Labs/Studies:  Labs per EMR and Reviewed (last 24hrs)    Objective:  Temp:  [36.4 ??C (97.5 ??F)-37.2 ??C (98.9 ??F)] 37.1 ??C (98.8 ??F)  Heart Rate:  [90-109] 98  Resp:  [16-20] 16  BP: (126-172)/(79-98) 172/93  SpO2:  [93 %-97 %] 93 %     GEN: Appears overall comfortable lying in bed, NAD  ENT: MMM  CV: RRR  Pulm: CTAB, normal WOB  Abd: NABS, soft, nontend  Ext:  No LE edema

## 2018-04-10 NOTE — Unmapped (Signed)
Palliative Care Progress Note    Consultation from Requesting Attending Physician:  Lillia Carmel, MD  Primary Care Provider:  Marc Morgans ROSE, MD    Code Status: Full Code  Advance Directive Status: Nil    Assessment/Recommendation:   SUMMARY:  Russell [name] is a 57 y/o male with metastatic RCC with diffusely metastatic disease to the bone s/p palliative XRT to the right femur and iliac mets, who has evidence of disease progression on Cabozantinib admitted for worsening left sided hip pain  ??  Symptom Assessment and Recommendations:      1) Cancer Associated Left Hip Pain: Has diffuse osseous metastases and a significant neuropathic component to his left hip pain that radiates to his left knee. There is no clear local intervention in the form of XRT. He has transient improvement with increase in the basal and bolus to 0.4mg . He did have significant pain episodes overnight with limited improvement with bolus dosing. Titration of his analgesia is difficult due to the half-life of methadone  - Increase basal dilaudid to 0.6mg /hr  - Increase bolus dilaudid to 0.8mg /q59min  - On 5/10 - Increase Methadone to 10mg  TID   - On 5/10 at 09:00 - Reduce basal dilaudid to 0.3mg /hr and aim to stop basal dilaudid on 5/11.  - Agree with Gabapentin 900mg  TID  - Agree with Tylenol 1g q6h  - Please increase Prednisone to 60mg     - Should there be difficulties with analgesia titration please call on call palliative care over the weekend.    2. Communication and ACP:  -- Prognosis and Understanding: The patient is aware of the incurable and metastatic nature of his malignancy. He was hoping to focus his attention on his pain management and will be looking for further cancer directed treatment options.    -- Goals of Care and Decision Making:   Decision Maker at Visit: Patient. The patient main focus and goal is improved pain management. I have provided him with the necessary paper work and the patient relations team have been notified so the patient can formally clarify his intention of having his friends [name] and Cay Schillings as his primary and secondary HCPOA.    Thank you for this consult. Please page  [phone]  or Palliative Care [phone]) if there are any questions.     Subjective:    New Events:    Had an exacerbation of his pain overnight when going to bed. Throbbing pain radiating down the left leg, not improved with bolus dosing. Left hip pain is specifically exacerbated in certain positions and with this particular bed. No change in character of the pain.    Symptom Severity and Assessment:  (0 = No symptom --> 10 = Most Severe)  Pain: Left hip pain that radiates to the knee, sharp and persistent 8/10 in severity.   Shortness of Breath: Denies  Secretions/Congestion: Denies  Nausea/Vomiting: Nil  Constipation: Had 2 bowel movements yesterday, denies any constipation issue  Fatigue: Denies  Sleep: Poor quality sleep particularly with pain overnight    Pertinent Medications:   Dilaudid PCA   Methadone      Objective:     Function:  70% - Ambulation: Reduced / unable to do normal work, some evidence of disease / Self-Care: Full / Intake: Normal or reduced / Level of Conscious: Full    Temp:  [36.2 ??C (97.2 ??F)-37.2 ??C (98.9 ??F)] 37.2 ??C (98.9 ??F)  Heart Rate:  [65-109] 104  Resp:  [16-20] 20  BP: (126-170)/(79-98) 166/97  SpO2:  [93 %-97 %] 93 %    I/O this shift:  In: 360 [P.O.:360]  Out: -     Physical Exam:  PHYSICAL EXAM  Vitals:    04/10/18 1617   BP: 161/92   Pulse: 104   Resp: 18   Temp: 36.9 ??C (98.5 ??F)   SpO2: 93%     GEN: Awake and alert, pleasant appearing male, in mild disress, no myoclonus  HEENT: Pupils equally round without scleral icterus  LYMPH: No palpable cervical or clavicular lymph nodes  CV: Regular rhythm with normal rate, no murmurs  LUNGS: Clear to auscultation bilaterally without wheeze or rhonchi  SKIN: No rashes, petechiae or jaundice noted  ABD: Soft and non-tender with no distention or palpable masses. Bowel sounds are active  [facility]: Alert and oriented to person, place and time  EXT: No edema noted of the lower extremity. Is tender to palpation over L4-L5      Test Results:  Lab Results   Component Value Date    WBC 5.6 04/10/2018    RBC 3.76 (L) 04/10/2018    HGB 10.3 (L) 04/10/2018    HCT 31.4 (L) 04/10/2018    MCV 83.5 04/10/2018    MCH 27.4 04/10/2018    MCHC 32.8 04/10/2018    RDW 17.8 (H) 04/10/2018    PLT 286 04/10/2018     Lab Results   Component Value Date    NA 135 04/10/2018    K 4.5 04/10/2018    CL 97 (L) 04/10/2018    CO2 29.0 04/10/2018    BUN 20 04/10/2018    CREATININE 0.53 (L) 04/10/2018    GLU 99 04/10/2018    CALCIUM 9.2 04/10/2018    ALBUMIN 3.1 (L) 04/10/2018    PHOS 3.5 02/23/2018      Lab Results   Component Value Date    ALKPHOS 161 (H) 04/10/2018    BILITOT 0.3 04/10/2018    PROT 5.7 (L) 04/10/2018    ALBUMIN 3.1 (L) 04/10/2018    ALT 41 04/10/2018    AST 61 (H) 04/10/2018       New Imaging: Nil      Total time spent with patient for evaluation & management: 1 Hour   Start time - stop time:    Greater than 50% time spent on counseling/coordination of care:  Yes.   See ACP Note from today for additional billable service:  No.

## 2018-04-11 LAB — COMPREHENSIVE METABOLIC PANEL
ALBUMIN: 3 g/dL — ABNORMAL LOW (ref 3.5–5.0)
ALKALINE PHOSPHATASE: 184 U/L — ABNORMAL HIGH (ref 38–126)
ALT (SGPT): 44 U/L (ref 19–72)
ANION GAP: 9 mmol/L (ref 9–15)
AST (SGOT): 59 U/L — ABNORMAL HIGH (ref 19–55)
BILIRUBIN TOTAL: 0.3 mg/dL (ref 0.0–1.2)
BLOOD UREA NITROGEN: 17 mg/dL (ref 7–21)
BUN / CREAT RATIO: 37
CALCIUM: 8.8 mg/dL (ref 8.5–10.2)
CHLORIDE: 99 mmol/L (ref 98–107)
CO2: 28 mmol/L (ref 22.0–30.0)
CREATININE: 0.46 mg/dL — ABNORMAL LOW (ref 0.70–1.30)
EGFR MDRD AF AMER: >=60 mL/min/{1.73_m2} (ref >=60)
EGFR MDRD NON AF AMER: >=60 mL/min/{1.73_m2} (ref >=60)
GLUCOSE RANDOM: 109 mg/dL (ref 65–179)
POTASSIUM: 4.4 mmol/L (ref 3.5–5.0)
SODIUM: 136 mmol/L (ref 135–145)

## 2018-04-11 LAB — BUN / CREAT RATIO: Urea nitrogen/Creatinine:MRto:Pt:Ser/Plas:Qn:: 37

## 2018-04-11 NOTE — Unmapped (Signed)
Pt is A&Ox4, calm, cooperative with care. VSSA. Pt with c/o uncontrolled pain overnight, MDs notified (see previous note.) PCA maintained throughout the shift, medications adjusted per team. Pt ambulated in hallways with standby assist and walker. OOB to chair. Family at bedside participating in care. Remains free from falls/injury. WCTM.

## 2018-04-11 NOTE — Unmapped (Signed)
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 *        Pressure Ulcer(s)    Active Pressure Ulcer     None                 Russell [name] is a 57 y.o. male who presented to [facility] with Cancer related pain.  ??  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  Palliative Care consult for guidance on pain mgmt. No radiation at this time due to widespread dz per XRT.   - dilaudid PCA wean continuous per [facility]. 0.6-->0.3/hr; maintain demand at 0.8/10min  - increase methadone to 10 TID  - gabapentin 900 TID   - lidoderm patches  - Tylenol 1G q6 (will decrease to 1G TID at discharge)  - ACP and PC SW to help with completion of HCPOA  ??  Diffusely Metastatic Renal Cell Carcinoma. Unfortunately, disease progression noted.  Oncology aware of admission.  Appreciate palliative care assistance as above.   - have stopped cabozantinib given disease progression  - increase prednisone to 60  - continue further conversations re: GOC   - page oncology at discharge for outpatient follow up  ??  Polysubstance Use Disorder. UDS positive for cocaine and opiates. Palliative Care team aware.   ??  Hyperkalemia (resolved): Received Kayexalate for K 5.6 on 5/10; improved on recheck     Constipation (resolved):  Reports daily BM  - Continue Senna 2  tabs BID, polyethylene PRN     Leukoplakia: Present on admission  -  Magic mouthwash q6h prn     Tobacco Use:    - Nicotine patch, nicotine gum PRN     HTN:   - Continue home amlodipine  ??  F/E/N:  Regular diet  Prophylaxis: lovenox 40??  Disposition: Home when pain controlled    ___________________________________________________________________    Subjective:  Early AM page from RN that pt's pain uncontrolled upon awakening. Caught up w/ PCA demand doses and pain adequately controlled within 1-2 hrs. Pt understands plan for PCA wean and is agreeable    Recent Results (from the past 24 hour(s))   Comprehensive Metabolic Panel    Collection Time: 04/11/18  7:03 AM   Result Value Ref Range    Sodium 136 135 - 145 mmol/L    Potassium 4.4 3.5 - 5.0 mmol/L    Chloride 99 98 - 107 mmol/L    CO2 28.0 22.0 - 30.0 mmol/L    BUN 17 7 - 21 mg/dL    Creatinine 8.75 (L) 0.70 - 1.30 mg/dL    BUN/Creatinine Ratio 37     EGFR MDRD Non Af Amer >=60 >=60 mL/min/1.52m2    EGFR MDRD Af Amer >=60 >=60 mL/min/1.63m2    Anion Gap 9 9 - 15 mmol/L    Glucose 109 65 - 179 mg/dL    Calcium 8.8 8.5 - 64.3 mg/dL    Albumin 3.0 (L) 3.5 - 5.0 g/dL    Total Protein 5.8 (L) 6.5 - 8.3 g/dL    Total Bilirubin 0.3 0.0 - 1.2 mg/dL    AST 59 (H) 19 -  55 U/L    ALT 44 19 - 72 U/L    Alkaline Phosphatase 184 (H) 38 - 126 U/L     Labs/Studies:  Labs per EMR and Reviewed (last 24hrs)    Objective:  Temp:  [36.4 ??C (97.6 ??F)-36.9 ??C (98.5 ??F)] 36.9 ??C (98.5 ??F)  Heart Rate:  [89-109] 109  Resp:  [18] 18  BP: (136-195)/(76-103) 179/97  SpO2:  [93 %-97 %] 95 %     GEN: Appears overall comfortable sitting up edge of bed, NAD  ENT: MMM  CV: RRR  Pulm: CTAB, normal WOB  Abd: NABS, soft, nontend  Ext:  No LE edema

## 2018-04-12 NOTE — Unmapped (Signed)
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 *        Pressure Ulcer(s)    Active Pressure Ulcer     None                 Russell [name] is a 57 y.o. male who presented to [facility] with Cancer related pain.  ??  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  Palliative Care consult for guidance on pain mgmt. No radiation at this time due to widespread dz per XRT.   - Dilaudid PCA continuous rate weaned successfully off over weekend  - wean dilaudid PCA demand dosing (0.8 q ) per Pall Care recs  - Methadone 10 TID, Gabapentin 900 TID, lidoderm patches  - Tylenol 1G q6 (will decrease to 1G TID at discharge)  - ACP and PC SW to help with completion of HCPOA - ready for notary Mon  ??  Diffusely Metastatic Renal Cell Carcinoma w/ dz progression. Oncology has stopped cabozantinib given disease progression.   - Prednisone 60 daily, protonix 20  - page oncology at discharge for outpatient follow up  ??  Polysub Use. UDS positive for cocaine, Dr. Legrand Como aware.  Constipation (resolved):  Reports daily BM on current bowel regimen: Senna, PEG prn  Leukoplakia:  Magic mouthwash q6h prn   Tobacco Use: Nicotine patch, nicotine gum PRN  HTN: Home amlodipine  ??  Prophylaxis: lovenox 40??  Disposition: Home when pain controlled    ___________________________________________________________________    Subjective:  Pt endorses adequate pain control at this time. Has not resisted downtitration of PCA continuous rate through the weekend. Denies oversedation when specifically asked about this in regard to eyelids drooping during conversation. Lidoderm helping his buttock pain.    Labs/Studies:  Labs per EMR and Reviewed (last 24hrs)    Objective:  Temp:  [36.4 ??C (97.5 ??F)-37 ??C (98.6 ??F)] 36.9 ??C (98.5 ??F)  Heart Rate:  [90-104] 97  Resp:  [18] 18  BP: (155-178)/(80-101) 164/94  SpO2:  [94 %-100 %] 97 %     GEN: Appears extremely comfortable sitting in bed watching cartoons. Eyelids slightly droopy  ENT: MMM  CV: RRR  Pulm: CTAB, normal WOB  Abd: NABS, soft, nontend  Ext:  No LE edema

## 2018-04-12 NOTE — Unmapped (Signed)
[name] is a [phone] w/ diffusely metastatic RCC admitted w/ uncontrolled pain. Palliative Care consult guided pain mgmt. No radiation at this time due to widespread dz per XRT. Dilaudid PCA continuous and demand rate provided, along w/ uptitration of home methadone and nonopioid regimen. PCA successfully weaned w/ ongoing adequate pain control on oral scheduled and PRN meds.  Final regimen: methadone 10 TID, dilaudid ***, gabapentin 900 TID, lidoderm patches, acetaminophen 1G TID, prednisone 60 daily    Oncology stopped cabozantinib given disease progression. Pt completed HCPOA papers. UDS positive for cocaine, Dr. Legrand Como (primary pall care MD) aware.

## 2018-04-12 NOTE — Unmapped (Signed)
Assumed care of pt from 1600-1930. Pain at tolerable level of 5 on PCA. Slightly tremulous, states this is not new. UO not recorded, pt reports he has been voiding throughout the day.  Reinforced fall precautions.     Problem: Adult Inpatient Plan of Care  Goal: Plan of Care Review  Outcome: Progressing  Flowsheets (Taken 04/11/2018 1828)  Plan of Care Reviewed With: patient  Goal: Patient-Specific Goal (Individualization)  Outcome: Progressing  Flowsheets (Taken 04/11/2018 1828)  Patient-Specific Goals (Include Timeframe): pain control  Goal: Absence of Hospital-Acquired Illness or Injury  Outcome: Progressing  Goal: Optimal Comfort and Wellbeing  Outcome: Progressing  Goal: Readiness for Transition of Care  Outcome: Progressing  Goal: Rounds/Family Conference  Outcome: Progressing     Problem: Fall Injury Risk  Goal: Absence of Fall and Fall-Related Injury  Outcome: Progressing  Intervention: Identify and Manage Contributors to Fall Injury Risk  Flowsheets (Taken 04/11/2018 1828)  Medication Review/Management: medications reviewed  Self-Care Promotion: independence encouraged;BADL personal objects within reach;safe use of adaptive equipment encouraged  Intervention: Promote Injury-Free Environment  Flowsheets (Taken 04/11/2018 1828)  Environmental Safety Modification: assistive device/personal items within reach;clutter free environment maintained;mobility aid in reach

## 2018-04-13 LAB — BASIC METABOLIC PANEL
ANION GAP: 6 mmol/L — ABNORMAL LOW (ref 9–15)
BLOOD UREA NITROGEN: 20 mg/dL (ref 7–21)
CALCIUM: 8.8 mg/dL (ref 8.5–10.2)
CHLORIDE: 98 mmol/L (ref 98–107)
CO2: 33 mmol/L — ABNORMAL HIGH (ref 22.0–30.0)
CREATININE: 0.53 mg/dL — ABNORMAL LOW (ref 0.70–1.30)
EGFR MDRD AF AMER: >=60 mL/min/{1.73_m2} (ref >=60)
EGFR MDRD NON AF AMER: >=60 mL/min/{1.73_m2} (ref >=60)
GLUCOSE RANDOM: 84 mg/dL (ref 65–179)
POTASSIUM: 4.4 mmol/L (ref 3.5–5.0)
SODIUM: 137 mmol/L (ref 135–145)

## 2018-04-13 LAB — EGFR MDRD NON AF AMER: Glomerular filtration rate/1.73 sq M.predicted.non black:ArVRat:Pt:Ser/Plas/Bld:Qn:Creatinine-based formula (MDRD): >=60

## 2018-04-13 LAB — CBC
HEMATOCRIT: 31 % — ABNORMAL LOW (ref 41.0–53.0)
HEMOGLOBIN: 9.9 g/dL — ABNORMAL LOW (ref 13.5–17.5)
MEAN CORPUSCULAR HEMOGLOBIN CONC: 31.9 g/dL (ref 31.0–37.0)
MEAN CORPUSCULAR HEMOGLOBIN: 26.5 pg (ref 26.0–34.0)
MEAN CORPUSCULAR VOLUME: 83 fL (ref 80.0–100.0)
PLATELET COUNT: 266 10*9/L (ref 150–440)
RED BLOOD CELL COUNT: 3.73 10*12/L — ABNORMAL LOW (ref 4.50–5.90)
WBC ADJUSTED: 5.4 10*9/L (ref 4.5–11.0)

## 2018-04-13 LAB — COMPREHENSIVE METABOLIC PANEL
ALBUMIN: 3 g/dL — ABNORMAL LOW (ref 3.5–5.0)
ALKALINE PHOSPHATASE: 194 U/L — ABNORMAL HIGH (ref 38–126)
ALT (SGPT): 51 U/L (ref 19–72)
ANION GAP: 6 mmol/L — ABNORMAL LOW (ref 9–15)
AST (SGOT): 61 U/L — ABNORMAL HIGH (ref 19–55)
BILIRUBIN TOTAL: 0.4 mg/dL (ref 0.0–1.2)
BLOOD UREA NITROGEN: 20 mg/dL (ref 7–21)
BUN / CREAT RATIO: 38
CALCIUM: 8.8 mg/dL (ref 8.5–10.2)
CHLORIDE: 98 mmol/L (ref 98–107)
CO2: 33 mmol/L — ABNORMAL HIGH (ref 22.0–30.0)
CREATININE: 0.53 mg/dL — ABNORMAL LOW (ref 0.70–1.30)
EGFR MDRD NON AF AMER: >=60 mL/min/{1.73_m2} (ref >=60)
GLUCOSE RANDOM: 84 mg/dL (ref 65–179)
POTASSIUM: 4.4 mmol/L (ref 3.5–5.0)
PROTEIN TOTAL: 5.7 g/dL — ABNORMAL LOW (ref 6.5–8.3)
SODIUM: 137 mmol/L (ref 135–145)

## 2018-04-13 LAB — PLATELET COUNT: Lab: 266

## 2018-04-13 LAB — BUN / CREAT RATIO: Urea nitrogen/Creatinine:MRto:Pt:Ser/Plas:Qn:: 38

## 2018-04-13 NOTE — Unmapped (Signed)
Pt doing well today, wants to go home. Pain tolerable on PCA pump, basal dose dc'ed today. Educated on plan for transition off PCA, pt agreeable. Lidocaine patches in place to R shoulder and L hip. Good appetite, no issues voiding. Reinforced fall precaution education.   Problem: Adult Inpatient Plan of Care  Goal: Plan of Care Review  Outcome: Progressing  Flowsheets  Taken 04/11/2018 1828  Plan of Care Reviewed With: patient  Taken 04/12/2018 1827  Progress: improving  Goal: Patient-Specific Goal (Individualization)  Outcome: Progressing  Flowsheets (Taken 04/12/2018 1827)  Patient-Specific Goals (Include Timeframe): pain control  Goal: Absence of Hospital-Acquired Illness or Injury  Outcome: Progressing  Intervention: Prevent Skin Injury  Flowsheets (Taken 04/12/2018 1827)  Pressure Reduction Techniques: frequent weight shift encouraged  Intervention: Prevent VTE (venous thromboembolism)  Flowsheets (Taken 04/12/2018 1827)  VTE Prevention/Management: ambulation promoted  Goal: Optimal Comfort and Wellbeing  Outcome: Progressing  Intervention: Monitor Pain and Promote Comfort  Flowsheets (Taken 04/12/2018 1827)  Pain Management Interventions: around-the-clock dosing utilized;care clustered;pain management plan reviewed with patient/caregiver;pain pump in use  Intervention: Provide Person-Centered Care  Flowsheets (Taken 04/12/2018 1827)  Trust Relationship/Rapport: care explained;choices provided;emotional support provided;empathic listening provided;questions answered;questions encouraged;reassurance provided;thoughts/feelings acknowledged  Goal: Readiness for Transition of Care  Outcome: Progressing  Goal: Rounds/Family Conference  Outcome: Progressing     Problem: Fall Injury Risk  Goal: Absence of Fall and Fall-Related Injury  Outcome: Progressing  Intervention: Identify and Manage Contributors to Fall Injury Risk  Flowsheets (Taken 04/12/2018 1827)  Medication Review/Management: medications reviewed  Self-Care Promotion: independence encouraged;BADL personal objects within reach;BADL personal routines maintained;safe use of adaptive equipment encouraged  Intervention: Promote Injury-Free Environment  Flowsheets (Taken 04/12/2018 1827)  Environmental Safety Modification: assistive device/personal items within reach;clutter free environment maintained

## 2018-04-13 NOTE — Unmapped (Signed)
Palliative Care Progress Note    Consultation from Requesting Attending Physician:  Georgiann Mccoy, MD  Primary Care Provider:  Marc Morgans ROSE, MD    Code Status: Full  Advance Directive Status: Nil    Assessment/Recommendation:   SUMMARY:  Russell [name] is a 57 y/o male with??metastatic RCC with diffusely metastatic disease to the bone s/p palliative XRT to the right femur and iliac mets, who has evidence of disease progression on Cabozantinib admitted for worsening left sided hip pain    1. Symptom Assessment and Recommendations:      1) Cancer associated left hip pain: Has had increased and persistent pain since weaning off basal dilaudid PCA dose over the weekend. Minimal breakthrough relief. Methadone increased from 10mg  BID to 10mg  TID on 5/11. Has also had pain affecting his right shoulder that has improved with lidocaine patch.  - Please restart 0.3mg /hr basal Dilaudid PCA  - Maintain Bolus at 0.8mg  every  - Methadone 10mg  TID (last dose titration on 5/11)  - Gabapentin 900mg  TID  - Lidocaine patch  - Prednisone 60mg  once daily  - Tylenol 1g q6h    2) Substance Misuse: The patient has had repeated urine toxicology screens that have been positive for cocaine. His last use was on 5/6 and he has had ongoing discussions with Dr. Legrand Como in the outpatient setting.    2. Communication and ACP:  -- Prognosis and Understanding: The patient is aware of the need for a change in his cancer directed therapy. However, his focus of attention continues to be his refractory pain.    -- Goals of Care and Decision Making:   Decision Maker at Visit: Patient. The patient will be having his HCPOA, Russell [name] and Russell [name] due to be formalized today.    3. Support / Other Communication and Counseling Topics:    Not on this visit      Thank you for this consult. Please page  [phone]  or Palliative Care [phone]) if there are any questions.     Subjective:    New Events:  Patient was weaned off the basal PCA dose over the weekend, has had exacerbation of his pain over the course of Sunday evening mainly located in the back and left hip.   - Has required a total of 36mg  IV Dilaudid over the past 24hrs. Is intermittently somnolent over the course of the interview.    Symptom Severity and Assessment:  (0 = No symptom --> 10 = Most Severe)  Pain: 8/10 severity, with minimal improvement with bolus dosing. States that character of the pain is located mostly on the left hip and radiates to his knee  Shortness of Breath: Nil  Secretions/Congestion: Nil  Nausea/Vomiting: Denies  Constipation: Opening his bowels daily  Fatigue: Is able to ambulate around the unit without difficulty.  Sleep: Is not sleeping well at times of pain exacerbations  Other:   Substance misuse: Has had positive urine toxicology over the course of his outpatien    Pertinent Medications:   Dilaudid PCA  Methadone      Objective:     Function:  70% - Ambulation: Reduced / unable to do normal work, some evidence of disease / Self-Care: Full / Intake: Normal or reduced / Level of Conscious: Full    Temp:  [36.5 ??C (97.7 ??F)-37.3 ??C (99.1 ??F)] 37.3 ??C (99.1 ??F)  Heart Rate:  [95-113] 111  Resp:  [18] 18  BP: (155-188)/(80-110) 188/107  SpO2:  [94 %-97 %]  97 %      Physical Exam:  GEN: Somnolent at times during the interview  HEENT: Pupils equally round without scleral icterus  LYMPH: No palpable cervical or clavicular lymph nodes  CV: Regular rhythm with normal rate, no murmurs  LUNGS: Clear to auscultation bilaterally without wheeze or rhonchi  SKIN: No rashes, petechiae or jaundice noted  ABD: Soft and non-tender with no distention or palpable masses. Bowel sounds are active  PYSCH: oriented to person, place and time, intermittently somnolent  EXT: No edema noted of the lower extremity      Test Results:  Lab Results   Component Value Date    WBC 5.4 04/13/2018    RBC 3.73 (L) 04/13/2018    HGB 9.9 (L) 04/13/2018    HCT 31.0 (L) 04/13/2018    MCV 83.0 04/13/2018 MCH 26.5 04/13/2018    MCHC 31.9 04/13/2018    RDW 17.2 (H) 04/13/2018    PLT 266 04/13/2018     Lab Results   Component Value Date    NA 137 04/13/2018    NA 137 04/13/2018    K 4.4 04/13/2018    K 4.4 04/13/2018    CL 98 04/13/2018    CL 98 04/13/2018    CO2 33.0 (H) 04/13/2018    CO2 33.0 (H) 04/13/2018    BUN 20 04/13/2018    BUN 20 04/13/2018    CREATININE 0.53 (L) 04/13/2018    CREATININE 0.53 (L) 04/13/2018    GLU 84 04/13/2018    GLU 84 04/13/2018    CALCIUM 8.8 04/13/2018    CALCIUM 8.8 04/13/2018    ALBUMIN 3.0 (L) 04/13/2018    PHOS 3.5 02/23/2018      Lab Results   Component Value Date    ALKPHOS 194 (H) 04/13/2018    BILITOT 0.4 04/13/2018    PROT 5.7 (L) 04/13/2018    ALBUMIN 3.0 (L) 04/13/2018    ALT 51 04/13/2018    AST 61 (H) 04/13/2018       New Imaging: Nil New      Total time spent with patient for evaluation & management: 45 Minutes   Start time - stop time:    Greater than 50% time spent on counseling/coordination of care:  No.   See ACP Note from today for additional billable service:  No.

## 2018-04-13 NOTE — Unmapped (Signed)
HTN & tachycardic r/t uncontrolled pain. 1 time order of IV dilaudid given for left hip & right shoulder pain. PCA continuous added & PCA cartridge changed. Pt visibly in severe pain r/t to hand & feet tremors, inability to keep eyes open during conversation, delayed responses, and constant groaning. No N/V. Wife at bedside.    Problem: Adult Inpatient Plan of Care  Goal: Plan of Care Review  Outcome: Progressing  Flowsheets  Taken 04/11/2018 1828 by Roby Lofts, RN  Plan of Care Reviewed With: patient  Taken 04/13/2018 1618 by Torrie Mayers, RN  Progress: declining  Goal: Patient-Specific Goal (Individualization)  Outcome: Progressing  Flowsheets  Taken 04/12/2018 1827 by Roby Lofts, RN  Patient-Specific Goals (Include Timeframe): pain control  Taken 04/09/2018 0642 by Dala Dock, RN  Individualized Care Needs: Cluster care, pain management  Taken 04/13/2018 1618 by Torrie Mayers, RN  Anxieties, Fears or Concerns: not getting adequate pain relief  Goal: Absence of Hospital-Acquired Illness or Injury  Outcome: Progressing  Intervention: Identify and Manage Fall Risk  Flowsheets (Taken 04/13/2018 0715)  Safety Interventions: fall reduction program maintained;lighting adjusted for tasks/safety;low bed;nonskid shoes/slippers when out of bed  Intervention: Prevent Skin Injury  Flowsheets (Taken 04/12/2018 2100 by Cristal Deer, RN)  Pressure Reduction Techniques: frequent weight shift encouraged  Intervention: Prevent VTE (venous thromboembolism)  Flowsheets (Taken 04/12/2018 1827 by Roby Lofts, RN)  VTE Prevention/Management: ambulation promoted  Intervention: Prevent Infection  Flowsheets (Taken 04/13/2018 1618)  Infection Prevention: equipment surfaces disinfected;handwashing promoted;personal protective equipment utilized;rest/sleep promoted;single patient room provided;visitors restricted/screened;environmental surveillance performed  Goal: Optimal Comfort and Wellbeing  Outcome: Progressing  Intervention: Monitor Pain and Promote Comfort  Flowsheets (Taken 04/12/2018 1827 by Roby Lofts, RN)  Pain Management Interventions: around-the-clock dosing utilized;care clustered;pain management plan reviewed with patient/caregiver;pain pump in use  Intervention: Provide Person-Centered Care  Flowsheets (Taken 04/12/2018 1827 by Roby Lofts, RN)  Trust Relationship/Rapport: care explained;choices provided;emotional support provided;empathic listening provided;questions answered;questions encouraged;reassurance provided;thoughts/feelings acknowledged  Goal: Readiness for Transition of Care  Outcome: Progressing  Intervention: Mutually Develop Transition Plan  Flowsheets (Taken 04/09/2018 1628 by Buford Dresser, MSW)  Discharge Facility/Level of Care Needs:  (home)  Equipment Needed After Discharge: none  Equipment Currently Used at Home: none  Anticipated Changes Related to Illness: none  Concerns to be Addressed: denies needs/concerns at this time  Readmission Within the Last 30 Days: no previous admission in last 30 days  Goal: Rounds/Family Conference  Outcome: Progressing  Flowsheets  Taken 04/13/2018 0932 by Buford Dresser, MSW  Clinical EDD (Estimated Discharge Date) : 04/15/18  Taken 04/11/2018 1233 by Cristal Deer, RN  Participants: nursing     Problem: Fall Injury Risk  Goal: Absence of Fall and Fall-Related Injury  Outcome: Progressing  Intervention: Promote Injury-Free Environment  Flowsheets  Taken 04/13/2018 0715 by Torrie Mayers, RN  Safety Interventions: fall reduction program maintained;lighting adjusted for tasks/safety;low bed;nonskid shoes/slippers when out of bed  Taken 04/12/2018 1827 by Roby Lofts, RN  Environmental Safety Modification: assistive device/personal items within reach;clutter free environment maintained

## 2018-04-13 NOTE — Unmapped (Addendum)
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 *        Pressure Ulcer(s)    Active Pressure Ulcer     None                 Russell [name] is a 57 y.o. male who presented to [facility] with Cancer related pain.  ??  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  Palliative Care consult for guidance on pain mgmt. No radiation at this time due to widespread dz per XRT.  Dilaudid PCA continuous rate weaned successfully off over weekend but patient with worsening pain today.  - dilaudid PCA 0.3 continuous (added back 5/13) plus demand dosing (0.8 q ) per Pall Care recs  - Methadone 10 TID, Gabapentin 900 TID, lidoderm patches  - Tylenol 1G q6 (will decrease to 1G TID at discharge)  ??  Diffusely Metastatic Renal Cell Carcinoma with disease progression. Oncology has stopped cabozantinib given disease progression.   - Prednisone 60 daily, protonix 20  - page oncology at discharge for outpatient follow up  ??  Polysub Use: UDS positive for cocaine, Dr. Legrand Como aware.    Constipation (resolved):  Reports daily BM on current bowel regimen  - continue Senna, PEG prn    Leukoplakia:  Magic mouthwash q6h prn     Tobacco Use:   - Nicotine patch, nicotine gum PRN    HTN:   - Continue home amlodipine  ??  Prophylaxis: lovenox 40??  Disposition: Home when pain controlled    ___________________________________________________________________    Subjective:  Pain worsened last night and is now 10/10 although patient is able to use demand PCA and was encourage to push PCA button but did not wish to do so. Shoulder pain is worse than previously (initally started about a week ago_    Labs/Studies:  Labs per EMR and Reviewed (last 24hrs)    Objective:  Temp:  [36.5 ??C (97.7 ??F)-37.3 ??C (99.1 ??F)] 37.3 ??C (99.1 ??F)  Heart Rate:  [95-113] 111  Resp:  [18] 18  BP: (164-188)/(94-110) 188/107  SpO2:  [94 %-97 %] 97 %     GEN: Sitting up in chair, appears slightly uncomfortable but NAD  HEENT: sclera anicteric, MMM  CV: RRR, no murmur  RESP: CTAB, normal WOB  ABD: soft, NT ND BS normoactive  EXT: no LE edema  NEURO: non-focal

## 2018-04-13 NOTE — Unmapped (Signed)
Pt. Is A & O x 4 this shift. Afebrile. Intermittent hypertension, resolved with improvement of pain. Encouraged pt. To utilize PCA button when in pain, pt. Verb. Understanding. Uses walker, steady gait observed, pt. Is standby assist when OOB. Encouraged pt. To notify staff for ambulation assistance. Pt. Verb. Understanding. No reports of injuries or complications. Bed low, locked, call bell w/in reach @ all times. Cont. Falls precaution protocol.    Problem: Adult Inpatient Plan of Care  Goal: Plan of Care Review  Outcome: Progressing  Goal: Patient-Specific Goal (Individualization)  Outcome: Progressing  Goal: Absence of Hospital-Acquired Illness or Injury  Outcome: Progressing  Goal: Optimal Comfort and Wellbeing  Outcome: Progressing  Goal: Readiness for Transition of Care  Outcome: Progressing  Goal: Rounds/Family Conference  Outcome: Progressing     Problem: Fall Injury Risk  Goal: Absence of Fall and Fall-Related Injury  Outcome: Progressing

## 2018-04-14 NOTE — Unmapped (Signed)
Pt. A & O this shift x 4, steady gait observed. Afebrile, intermittent hypertension, paged oncall, see new order, effective upon BP recheck. Encouraged pt. To push button on PCA when he sees the green flashing light if having pain. Verb. Understanding. Cont. FPP. Bed low, locked, callbell w/in reach @ all times. No complications/injuries this shift.  Problem: Adult Inpatient Plan of Care  Goal: Plan of Care Review  Outcome: Progressing  Goal: Patient-Specific Goal (Individualization)  Outcome: Progressing  Goal: Absence of Hospital-Acquired Illness or Injury  Outcome: Progressing  Goal: Optimal Comfort and Wellbeing  Outcome: Progressing  Goal: Readiness for Transition of Care  Outcome: Progressing  Goal: Rounds/Family Conference  Outcome: Progressing     Problem: Fall Injury Risk  Goal: Absence of Fall and Fall-Related Injury  Outcome: Progressing

## 2018-04-14 NOTE — Unmapped (Signed)
Palliative Care Progress Note    Consultation from Requesting Attending Physician:  Georgiann Mccoy, MD  Primary Care Provider:  Marc Morgans ROSE, MD    Code Status: Full  Advance Directive Status: Nil    Assessment/Recommendation:   SUMMARY:  Russell [name] is a 57 y/o male with??metastatic RCC with diffusely metastatic disease to the bone s/p palliative XRT to the right femur and iliac mets, who has evidence of disease progression on Cabozantinib admitted for worsening left sided hip pain    1. Symptom Assessment and Recommendations:      1) Cancer associated left hip pain and shoulder pain: The patient has diffuse osseous metastases. He has pain in his right shoulder and left hip with no clear areas that are amenable for further XRT. Was transitioned to Methadone in the outpatient setting due to concerns for ongoing substance misuse as highlighted below. Ongoing difficulties with increasing dilaudid requirements via PCA. Appreciate Acute pain team involvement and will assess response to ketamine.  - Please wean off basal dose of Dilaudid over the next 12hrs while on Ketamine  - Maintain Bolus at 0.9mg  every - will aim to   - Methadone 10mg  TID (last dose titration on 5/11)  - Given minimal response to prednisone  - will restart taper to 30mg  (5/15 - decrease by 67m every 3 days until stopped)  - Gabapentin 900mg  TID  - Lidocaine patch  - Tylenol 1g q6h    2) Substance Misuse: The patient has had repeated urine toxicology screens that have been positive for cocaine. His last use was on 5/6 and he has had ongoing discussions with Dr. Legrand Como in the outpatient setting.    2. Communication and ACP:  -- Prognosis and Understanding: The patient has previously expressed his understanding of the incurable nature of his underlying RCC and the aim is to change therapy based on his progression of disease. Currently his pain is severely limiting and the focus on his care.      -- Goals of Care and Decision Making: Decision Maker at Visit: Patient. The patient ultimately wants to return home with his friend [name]. He states that he wants to pursue ongoing therapy for his RCC and is hopeful for ongoing management for his RCC.      Thank you for this consult. Please page  [phone]  or Palliative Care [phone]) if there are any questions.     Subjective:    New Events:  Has had improved pain control over the evening since restarting the basal dose of Dilaudid. He has significant pain when ambulating in his left hip. The shoulder pain is exacerbated at times of sleeping and he is unable to abduct it.    Symptom Severity and Assessment:  (0 = No symptom --> 10 = Most Severe)  Pain: 6/10 severity in his left hip and 7/10 in right shouldr with intermittent exacerbations particularly   Shortness of Breath: Nil  Secretions/Congestion: Nil  Nausea/Vomiting: Denies  Constipation: Opening his bowels daily  Fatigue: Is able to ambulate around the unit without difficulty.  Sleep: Is not sleeping well at times of pain exacerbations  Other:   Substance misuse: Has had positive urine toxicology over the course of his outpatient    Pertinent Medications:   Dilaudid PCA  Methadone 10mg  TID    Objective:     Function:  70% - Ambulation: Reduced / unable to do normal work, some evidence of disease / Self-Care: Full / Intake: Normal or reduced /  Level of Conscious: Full    Temp:  [36.5 ??C (97.7 ??F)-37.3 ??C (99.2 ??F)] 37.2 ??C (98.9 ??F)  Heart Rate:  [102-121] 109  Resp:  [16-24] 16  BP: (128-196)/(81-130) 167/101  SpO2:  [93 %-99 %] 93 %      Physical Exam:  GEN: More alert today  HEENT: Pupils equally round without scleral icterus  CV: Regular rhythm with normal rate, no murmurs  LUNGS: Clear to auscultation bilaterally without wheeze or rhonchi  MSK: Very tender to light touch at the right shoulder  ABD: Soft and non-tender with no distention or palpable masses. Bowel sounds are hypoactive  PYSCH: oriented to person, place and time,  EXT: No edema noted of the lower extremity      Test Results:  Lab Results   Component Value Date    WBC 5.4 04/13/2018    RBC 3.73 (L) 04/13/2018    HGB 9.9 (L) 04/13/2018    HCT 31.0 (L) 04/13/2018    MCV 83.0 04/13/2018    MCH 26.5 04/13/2018    MCHC 31.9 04/13/2018    RDW 17.2 (H) 04/13/2018    PLT 266 04/13/2018     Lab Results   Component Value Date    NA 137 04/13/2018    NA 137 04/13/2018    K 4.4 04/13/2018    K 4.4 04/13/2018    CL 98 04/13/2018    CL 98 04/13/2018    CO2 33.0 (H) 04/13/2018    CO2 33.0 (H) 04/13/2018    BUN 20 04/13/2018    BUN 20 04/13/2018    CREATININE 0.53 (L) 04/13/2018    CREATININE 0.53 (L) 04/13/2018    GLU 84 04/13/2018    GLU 84 04/13/2018    CALCIUM 8.8 04/13/2018    CALCIUM 8.8 04/13/2018    ALBUMIN 3.0 (L) 04/13/2018    PHOS 3.5 02/23/2018      Lab Results   Component Value Date    ALKPHOS 194 (H) 04/13/2018    BILITOT 0.4 04/13/2018    PROT 5.7 (L) 04/13/2018    ALBUMIN 3.0 (L) 04/13/2018    ALT 51 04/13/2018    AST 61 (H) 04/13/2018       New Imaging: Nil New      Total time spent with patient for evaluation & management: 30 Minutes     Greater than 50% time spent on counseling/coordination of care:  No.   See ACP Note from today for additional billable service:  No.

## 2018-04-14 NOTE — Unmapped (Signed)
HTN & tachycardic d/t uncontrolled pain. Off going RN told oncoming RN that pt got angry about methadone & flung his arms which took out his PIV around 0600. Pt went roughly 2hrs w/o PCA due to being a hard stick. Vasc team consulted & placed new PIV. PCA cartridge changed. IV Ketamine started after pain management consult. Wife & family at bedside. Ctm    Problem: Adult Inpatient Plan of Care  Goal: Plan of Care Review  Outcome: Progressing  Flowsheets (Taken 04/14/2018 1604)  Progress: declining  Plan of Care Reviewed With: spouse;patient;family  Goal: Patient-Specific Goal (Individualization)  Outcome: Progressing  Flowsheets  Taken 04/12/2018 1827 by Roby Lofts, RN  Patient-Specific Goals (Include Timeframe): pain control  Taken 04/09/2018 0642 by Dala Dock, RN  Individualized Care Needs: Cluster care, pain management  Taken 04/13/2018 1618 by Torrie Mayers, RN  Anxieties, Fears or Concerns: not getting adequate pain relief  Goal: Absence of Hospital-Acquired Illness or Injury  Outcome: Progressing  Intervention: Identify and Manage Fall Risk  Flowsheets (Taken 04/14/2018 0705)  Safety Interventions: fall reduction program maintained;lighting adjusted for tasks/safety;low bed;nonskid shoes/slippers when out of bed  Intervention: Prevent Skin Injury  Flowsheets (Taken 04/12/2018 2100 by Cristal Deer, RN)  Pressure Reduction Techniques: frequent weight shift encouraged  Intervention: Prevent VTE (venous thromboembolism)  Flowsheets (Taken 04/13/2018 2100 by Cristal Deer, RN)  VTE Prevention/Management: ambulation promoted  Intervention: Prevent Infection  Flowsheets (Taken 04/14/2018 0705)  Infection Prevention: environmental surveillance performed;equipment surfaces disinfected;handwashing promoted;personal protective equipment utilized;rest/sleep promoted;single patient room provided;visitors restricted/screened  Goal: Readiness for Transition of Care  Outcome: Progressing  Intervention: Mutually Develop Transition Plan  Flowsheets (Taken 04/09/2018 1628 by Buford Dresser, MSW)  Discharge Facility/Level of Care Needs:  (home)  Equipment Needed After Discharge: none  Equipment Currently Used at Home: none  Anticipated Changes Related to Illness: none  Concerns to be Addressed: denies needs/concerns at this time  Readmission Within the Last 30 Days: no previous admission in last 30 days  Goal: Rounds/Family Conference  Outcome: Progressing  Flowsheets  Taken 04/14/2018 1046 by Buford Dresser, MSW  Clinical EDD (Estimated Discharge Date) : 04/17/18  Taken 04/11/2018 1233 by Cristal Deer, RN  Participants: nursing     Problem: Fall Injury Risk  Goal: Absence of Fall and Fall-Related Injury  Outcome: Progressing  Intervention: Promote Injury-Free Environment  Flowsheets  Taken 04/14/2018 6578 by Torrie Mayers, RN  Safety Interventions: fall reduction program maintained;lighting adjusted for tasks/safety;low bed;nonskid shoes/slippers when out of bed  Taken 04/12/2018 1827 by Roby Lofts, RN  Environmental Safety Modification: assistive device/personal items within reach;clutter free environment maintained

## 2018-04-14 NOTE — Unmapped (Signed)
=  Department of Anesthesiology  Pain Medicine Division    Chronic Pain Consult Note      Requesting Attending Physician:  Georgiann Mccoy, MD  Service Requesting Consult:  [facility]    Assessment/Recommendations:    The patient was seen in consultation on request of Georgiann Mccoy, MD regarding assistance with pain management. The patient is not obtaining adequate pain relief on current medication regimen.     The patient is a 57 y.o. male with metastatic renal cell carcinoma admitted for opioid refractory pain despite significant dose escalation. He's currently on HM PCA 0.3 mg/hr basal rate with demand of 0.9 mg q33min. He's followed by Palliative Care as an outpatient and had a UDS positive for cocaine. We were consulted for consideration for ketamine infusion.     Ketamine infusion discussed with the patient who is agreeable to trying. We will defer medication management otherwise to Palliative care team given their ongoing relationship with the patient.     Recommendations:  - Patient is on a low dose ketamine infusion for pain   -All ketamine orders will be managed by the [facility] Chronic Pain Service only   -Start ketamine infusion at 0.1mg /kg/hr   -Patient location: Floor: dose can change every 6 hours   -Day 0 (max-7 days). Started (date): 04/14/18   -Prior to starting: Recent EKG (04/09/18), CMP (LFTs 61/51 on 04/13/18)   -Daily CMP while running   -Lorazepam available PRN for agitation/hallucinations   -Glycopyrrolate available PRN for salivation   -Zofran PRN available for nausea   -Please contact the Chronic Pain service with any questions or concerns about ketamine infusion.  - Defer remainder of analgesic regimen to Palliative Care team given their ongoing relationship with the patient.     We will continue to follow.    History of Present Illness:      Reason for Consult:  Poorly controlled pain in setting of advanced malignancy    Russell [name] is seen in consultation at the request of Dr. Leroy Kennedy for evaluation of  Poorly controlled hip and lower extremity pain in setting of widespread renal cancer.  The pain started many months ago.  The patient describes His pain as aching, sharp and stabbing.   He rates His pain level as 7/10.  The pain is constant and tends to be continuous . The symptoms do not radiate.    For the pain, the patient's current medication regimen includes:  - HM PCA 0.3 mg/hr basal rate with demand 0.9 mg q45min (40 mg in past 24 hours)  - APAP 1g q6h  - Lidocaine patch  - Gabapentin 900 mg TID     Prior to admission, the patient was on home opiate medications.  Hehas not been followed by a pain clinic.  The [place] CSRS was reviewed.    Allergies    No Known Allergies    Home Medications    Medications Prior to Admission   Medication Sig Dispense Refill Last Dose   ??? albuterol (PROVENTIL HFA;VENTOLIN HFA) 90 mcg/actuation inhaler Inhale 2 puffs.   Taking   ??? [EXPIRED] aluminum-magnesium hydroxide-simethicone 200-200-20 mg/5 mL Susp 80 mL, diphenhydrAMINE 12.5 mg/5 mL Liqd 200 mg, nystatin 100,000 unit/mL Susp 8,000,000 Units, distilled water Liqd 80 mL Take 5 mL by mouth every six (6) hours as needed. Swish, gargle, spit as needed.  May be swallowed if esophageal involvement. 200 mL 0 Taking   ??? amLODIPine (NORVASC) 5 MG tablet Take 1  tablet (5 mg total) by mouth daily. 90 tablet 5 Taking   ??? cabozantinib 40 mg Tab Take 40 mg by mouth daily. 30 tablet 5 Taking   ??? gabapentin (NEURONTIN) 300 MG capsule Take 2 capsules (600 mg total) by mouth Three (3) times a day. 180 capsule 11 Taking   ??? ibuprofen (ADVIL,MOTRIN) 600 MG tablet Take 1 tablet (600 mg total) by mouth every eight (8) hours as needed for pain. 60 tablet 2 Taking   ??? lidocaine (LIDODERM) 5 % patch Place 1 patch on the skin daily. Apply to affected area for 12 hours only each day (then remove patch)   Taking   ??? lidocaine-diphenhydramine-aluminum-magnesium (FIRST-MOUTHWASH BLM) 200-25-400-40 mg/30 mL Mwsh SWISH, GARGLE & SPIT BY MOUTH 1 TEASPOONFUL EVERY 6 HOURS AS NEEDED. MAY BE SWALLOWED IF ESOPHAGEAL INVOLVEMENT  0 Taking   ??? methadone (DOLOPHINE) 5 MG tablet Take 2 tablets (10 mg total) by mouth Two (2) times a day. 28 tablet 0    ??? naloxone (NARCAN) 4 mg nasal spray One spray in either nostril once for known/suspected opioid overdose. May repeat every 2-3 minutes in alternating nostril til EMS arrives 1 each 0 Taking   ??? omeprazole (PRILOSEC) 20 MG capsule Take 20 mg by mouth daily. TAKE 1 CAPSULE (20 MG TOTAL) BY MOUTH DAILY.  6 Taking   ??? ondansetron (ZOFRAN-ODT) 4 MG disintegrating tablet Take 2 tablets (8 mg total) by mouth every eight (8) hours as needed for nausea. 60 tablet 3 Taking   ??? oxyCODONE (ROXICODONE) 10 mg immediate release tablet Take 1 tablet (10 mg total) by mouth every four (4) hours as needed for pain. 42 tablet 0    ??? predniSONE (DELTASONE) 20 MG tablet Take 3 tablets (60 mg total) by mouth daily. 63 tablet 0 Taking   ??? tiotropium (SPIRIVA WITH HANDIHALER) 18 mcg inhalation capsule Place 18 mcg into inhaler and inhale.   Taking       Inpatient Medications  Current Facility-Administered Medications   Medication Dose Route Frequency Provider Last Rate Last Dose   ??? acetaminophen (TYLENOL) tablet 1,000 mg  1,000 mg Oral Q6H [facility], AGNP   1,000 mg at 04/14/18 1100   ??? albuterol (PROVENTIL HFA;VENTOLIN HFA) 90 mcg/actuation inhaler 2 puff  2 puff Inhalation Q4H PRN Mauri Brooklyn, MD       ??? amLODIPine (NORVASC) tablet 5 mg  5 mg Oral Daily Mauri Brooklyn, MD   5 mg at 04/14/18 0801   ??? enoxaparin (LOVENOX) syringe 40 mg  40 mg Subcutaneous Q24H [facility]i [facility], MD   40 mg at 04/14/18 0800   ??? gabapentin (NEURONTIN) capsule 900 mg  900 mg Oral TID [facility], AGNP   900 mg at 04/14/18 0801   ??? glycopyrrolate (ROBINUL) injection 0.2 mg  0.2 mg Intravenous Q4H PRN Ignatius Specking, MD       ??? HYDROmorphone (DILAUDID) 50mg /75ml (1mg /ml) PCA CADD   Intravenous Continuous Gerlean Ren, MD   Stopped at 04/14/18 0600   ??? ketamine 250 mg in sodium chloride 0.9 % 250 mL (1 mg/mL) infusion  0.1 mg/kg/hr Intravenous Continuous Ignatius Specking, MD       ??? lidocaine (LIDODERM) 5 % patch 3 patch  3 patch Transdermal Daily [address] [place], AGNP   3 patch at 04/14/18 0800   ??? lidocaine-diphenhydramine-aluminum-magnesium (FIRST-MOUTHWASH BLM) mouthwash 10 mL  10 mL Mouth 4x Daily PRN Lillia Carmel, MD       ???  LORazepam (ATIVAN) injection 1 mg  1 mg Intravenous Q2H PRN Ignatius Specking, MD       ??? methadone (DOLOPHINE) tablet 10 mg  10 mg Oral TID Lillia Carmel, MD   10 mg at 04/14/18 1610   ??? naloxone [facility]) injection 0.4 mg  0.4 mg Intravenous Q5 Min PRN Mauri Brooklyn, MD       ??? nicotine (NICODERM CQ) 14 mg/24 hr patch 1 patch  1 patch Transdermal Daily Mauri Brooklyn, MD   1 patch at 04/14/18 0802   ??? nicotine polacrilex (NICORETTE) gum 2 mg  2 mg Buccal Q1H PRN Mauri Brooklyn, MD        Or   ??? nicotine polacrilex (NICORETTE) lozenge 2 mg  2 mg Buccal Q1H PRN Mauri Brooklyn, MD       ??? nicotine polacrilex (NICORETTE) gum 2 mg  2 mg Buccal Q2H PRN [facility], AGNP   2 mg at 04/09/18 1445   ??? ondansetron (ZOFRAN) injection 4 mg  4 mg Intravenous Q6H PRN Mauri Brooklyn, MD       ??? ondansetron [facility]) injection 4 mg  4 mg Intravenous Q6H PRN Ignatius Specking, MD       ??? ondansetron (ZOFRAN-ODT) disintegrating tablet 8 mg  8 mg Oral Q8H PRN Mauri Brooklyn, MD       ??? pantoprazole (PROTONIX) EC tablet 20 mg  20 mg Oral Daily Mauri Brooklyn, MD   20 mg at 04/14/18 0801   ??? polyethylene glycol (MIRALAX) packet 17 g  17 g Oral Daily PRN Mauri Brooklyn, MD       ??? predniSONE (DELTASONE) tablet 60 mg  60 mg Oral Daily Lillia Carmel, MD   60 mg at 04/14/18 0801   ??? senna (SENOKOT) tablet 2 tablet  2 tablet Oral BID Mauri Brooklyn, MD   2 tablet at 04/14/18 0801   ??? sodium chloride (NS) 0.9 % infusion  10 mL/hr Intravenous Continuous Ignatius Specking, MD             Past Medical History    Past Medical History:   Diagnosis Date   ??? Bone metastasis (CMS-HCC)    ??? Renal cancer (CMS-HCC)            Past Surgical History    Past Surgical History:   Procedure Laterality Date   ??? FLEXIBLE BRONCHOSCOPY  07/24/2017   ??? HERNIA REPAIR             Family History    Family History   Problem Relation Age of Onset   ??? Brain cancer Paternal Uncle          Social History:  Social History     Tobacco Use   Smoking Status Current Every Day Smoker   ??? Packs/day: 1.00   ??? Types: Cigarettes   Smokeless Tobacco Never Used     Social History     Substance and Sexual Activity   Alcohol Use Yes     Social History     Substance and Sexual Activity   Drug Use Yes   ??? Types: Cocaine         Review of Systems    A 12 system review of systems was negative except as noted in HPI    Objective:     Vital Signs  Temp:  [36.5 ??C-37.3 ??C] 37.3 ??C  Heart Rate:  [102-121] 115  Resp:  [  16-24] 16  BP: (136-196)/(81-130) 165/93  MAP (mmHg):  [108] 108  SpO2:  [93 %-99 %] 93 %      Physical Exam    GENERAL:  The patient is a well developed, well-nourished obese male and appears to be in obvious discomfort. Patient is a fair historian.   HEAD/NECK:    Reveals normocephalic/atraumatic. Mucous membranes are moist. CARDIOVASCULAR:   Tachycardic  LUNGS:   Normal work of breathing on room air  EXTREMITIES:  No clubbing, cyanosis, or edema was noted.  NEUROLOGIC:    The patient was alert and oriented times four with normal language, attention, cognition and memory.  MUSCULOSKELETAL:    Deferred  SKIN:  No obvious rashes lesions or erythema  PSY:  Appropriate affect and mood.      Test Results    All lab results last 24 hours:  No results found for this or any previous visit (from the past 24 hour(s)).    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

## 2018-04-14 NOTE — Unmapped (Addendum)
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 *        Pressure Ulcer(s)    Active Pressure Ulcer     None                 Russell [name] is a 57 y.o. male who presented to [facility] with Cancer related pain.  ??  Opioid refractory pain in the setting of metastatic RCC: Appreciate palliative care assistance with pain management. Not a candidate for radiation at this time due to widespread disease.  His pain has been difficult to control; minimal improvement with Methadone and Dilaudid PCA.  Had to add back continuous rate on PCA given uncontrolled pain after continuous dose was stopped over the weekend.  At pall care's recommendation, have reached out to acute pain service for consideration of ketamine infusion.  Used 37.5 mg hydromorphone over last 24 hours.   - dilaudid PCA 0.3 continuous plus demand dosing 0.9 q .   - Methadone 10 TID  - Gabapentin 900 TID  - lidoderm patches  - Tylenol 1G q6 (will decrease to 1G TID at discharge)  - prednisone 60 mg daily   ??  Diffusely Metastatic Renal Cell Carcinoma with disease progression. Oncology has stopped cabozantinib given disease progression.  Long-term goals of care have not been formally addressed  - Prednisone 60 daily  - Protonix 20 mg while on prednisone   - page oncology at discharge for outpatient follow up  ??  Polysubstance abuse: UDS positive for cocaine, Dr. Legrand Como aware.    Constipation (resolved):  Reports daily BM on current bowel regimen  - continue Senna, PEG prn    Leukoplakia:  Magic mouthwash q6h prn     Tobacco Use:   - Nicotine patch, nicotine gum PRN    HTN:   - Continue home amlodipine  ??  Prophylaxis: lovenox 40??  Disposition: Home when pain controlled ___________________________________________________________________    Subjective:  Lost IV access and was without IV pain medication x2 hours.  Patient reports pain is beginning to be controlled now that the PCA is back in place.  Briefly discussed long-term goals but no in-depth conversation at this time due to uncontrolled pain.  In the short-term he would like to be able to go outside.  Patient is requesting someone come work on his thermostat because he is cold. Temp set to 75 and patient in shorts and t-shirt without blanket. Turned up room thermostat and provided warm blanket.     Labs/Studies:  Labs per EMR and Reviewed (last 24hrs)    Objective:  Temp:  [36.5 ??C (97.7 ??F)-37.3 ??C (99.1 ??F)] 37 ??C (98.6 ??F)  Heart Rate:  [102-121] 110  Resp:  [20-24] 21  BP: (136-196)/(81-130) 167/96  SpO2:  [95 %-99 %] 98 %     GEN: Lying in bed, appears slightly uncomfortable but NAD  HEENT: sclera anicteric, MMM  CV: RRR, no murmur  RESP: CTAB, normal WOB  ABD: soft, NT ND BS normoactive  EXT: no LE edema  NEURO: non-focal

## 2018-04-14 NOTE — Unmapped (Signed)
The Venous Access Team (VAT) received a request from care RN.   PIV placed.           PIV Workup / Procedure Time:  15 minutes      The primary RN was notified.       Thank you,     Elizabeth Palau RN Venous Access Team

## 2018-04-14 NOTE — Unmapped (Signed)
Order was placed for PIV by Venous Access Team (VAT).  Patient was assessed for placement of a PIV. Access was obtained. Blood return noted.  Dressing intact and device well secured.  Flushed with normal saline.  Pt advised to inform RN of any s/s of discomfort at the PIV site.    Workup / Procedure Time:  15 minutes      The primary RN was notified.       Thank you,     Marylee Floras RN Venous Access Team

## 2018-04-15 LAB — CBC
HEMATOCRIT: 29.1 % — ABNORMAL LOW (ref 41.0–53.0)
MEAN CORPUSCULAR HEMOGLOBIN CONC: 32.5 g/dL (ref 31.0–37.0)
MEAN CORPUSCULAR VOLUME: 83.3 fL (ref 80.0–100.0)
MEAN PLATELET VOLUME: 7.4 fL (ref 7.0–10.0)
PLATELET COUNT: 219 10*9/L (ref 150–440)
RED BLOOD CELL COUNT: 3.49 10*12/L — ABNORMAL LOW (ref 4.50–5.90)
RED CELL DISTRIBUTION WIDTH: 17.9 % — ABNORMAL HIGH (ref 12.0–15.0)
WBC ADJUSTED: 4.3 10*9/L — ABNORMAL LOW (ref 4.5–11.0)

## 2018-04-15 LAB — COMPREHENSIVE METABOLIC PANEL
ALBUMIN: 3 g/dL — ABNORMAL LOW (ref 3.5–5.0)
ANION GAP: 12 mmol/L (ref 9–15)
AST (SGOT): 157 U/L — ABNORMAL HIGH (ref 19–55)
BILIRUBIN TOTAL: 0.6 mg/dL (ref 0.0–1.2)
BUN / CREAT RATIO: 44
CALCIUM: 8.6 mg/dL (ref 8.5–10.2)
CHLORIDE: 99 mmol/L (ref 98–107)
CO2: 24 mmol/L (ref 22.0–30.0)
CREATININE: 0.48 mg/dL — ABNORMAL LOW (ref 0.70–1.30)
EGFR MDRD AF AMER: >=60 mL/min/{1.73_m2} (ref >=60)
EGFR MDRD NON AF AMER: >=60 mL/min/{1.73_m2} (ref >=60)
GLUCOSE RANDOM: 165 mg/dL (ref 65–179)
POTASSIUM: 4.2 mmol/L (ref 3.5–5.0)
PROTEIN TOTAL: 5.6 g/dL — ABNORMAL LOW (ref 6.5–8.3)
SODIUM: 135 mmol/L (ref 135–145)

## 2018-04-15 LAB — WBC ADJUSTED: Lab: 4.3 — ABNORMAL LOW

## 2018-04-15 LAB — CREATININE: Creatinine:MCnc:Pt:Ser/Plas:Qn:: 0.48 — ABNORMAL LOW

## 2018-04-15 NOTE — Unmapped (Signed)
Department of Anesthesiology  Pain Medicine Division    Chronic Pain Followup Inpatient Consult Note    Requesting Attending Physician:  Georgiann Mccoy, MD  Service Requesting Consult:  [facility]    Assessment/Recommendations:  The patient was seen in consultation on request of Georgiann Mccoy, MD regarding assistance with pain management. The patient is not obtaining adequate pain relief on current medication regimen.   ??  The patient is a 57 y.o. male with metastatic renal cell carcinoma admitted for opioid refractory pain despite significant dose escalation. He's currently on HM PCA 0.3 mg/hr basal rate with demand of 0.9 mg q9min. He's followed by Palliative Care as an outpatient and had a UDS positive for cocaine. We were consulted for consideration for ketamine infusion.     Interval: Patient reports that he's doing okay, but does not like the way the ketamine makes him feel and would like to have it discontinued.     Recommendations:  - -Patient is on a low dose ketamine infusion for pain   -All ketamine orders will be managed by the [facility] Chronic Pain Service only   -Discontinue ketamine infusion at 0.1mg /kg/hr    -Day 1 (max-7 days). Started (date): 5/14   -Daily CMP while running   -Prn medications including lorazepam, glycopyrrolate, and zofran discontinued   - Defer remainder of analgesic regimen to Palliative Care team given their ongoing relationship with the patient.     We will sign off at this time.  Please contact the chronic pain service if we can be of further assistance with pain control.      Interim History  There were no Acute Events Overnight.  The patient is not obtaining adequate pain relief on current medication regimen and feels that their pain is Somewhat controlled    Analgesia Evaluation:  Pain at minimum: 3/10  Pain at maximum: 10/10    Current pain medication regimen (including how frequent PRN's were used):  - Ketamine infusion at 0.1 mg/kg/hr  - HM PCA 0.9 mg q74min - Methadone 10 mg TID  - APAP 1g q6h  - Lidocaine patch  - Gabapentin 900 mg TID    Inpatient Medications  Current Facility-Administered Medications   Medication Dose Route Frequency Provider Last Rate Last Dose   ??? acetaminophen (TYLENOL) tablet 1,000 mg  1,000 mg Oral Q6H [facility], AGNP   1,000 mg at 04/15/18 1610   ??? albuterol (PROVENTIL HFA;VENTOLIN HFA) 90 mcg/actuation inhaler 2 puff  2 puff Inhalation Q4H PRN Mauri Brooklyn, MD       ??? amLODIPine (NORVASC) tablet 5 mg  5 mg Oral Daily Mauri Brooklyn, MD   5 mg at 04/15/18 0821   ??? enoxaparin (LOVENOX) syringe 40 mg  40 mg Subcutaneous Q24H [facility] Mauri Brooklyn, MD   40 mg at 04/15/18 0820   ??? gabapentin (NEURONTIN) capsule 900 mg  900 mg Oral TID [facility], AGNP   900 mg at 04/15/18 9604   ??? glycopyrrolate (ROBINUL) injection 0.2 mg  0.2 mg Intravenous Q4H PRN Ignatius Specking, MD       ??? HYDROmorphone (DILAUDID) 50mg /85ml (1mg /ml) PCA CADD   Intravenous Continuous Laqueta Carina, AGNP       ??? ketamine 250 mg in sodium chloride 0.9 % 250 mL (1 mg/mL) infusion  0.1 mg/kg/hr Intravenous Continuous Ignatius Specking, MD 9 mL/hr at 04/14/18 1406 0.1 mg/kg/hr at 04/14/18 1406   ??? lidocaine (LIDODERM) 5 %  patch 3 patch  3 patch Transdermal Daily [facility]   3 patch at 04/15/18 0820   ??? lidocaine-diphenhydramine-aluminum-magnesium (FIRST-MOUTHWASH BLM) mouthwash 10 mL  10 mL Mouth 4x Daily PRN Lillia Carmel, MD       ??? LORazepam (ATIVAN) injection 1 mg  1 mg Intravenous Q2H PRN Ignatius Specking, MD       ??? methadone (DOLOPHINE) tablet 10 mg  10 mg Oral TID Lillia Carmel, MD   10 mg at 04/15/18 0538   ??? naloxone (NARCAN) injection 0.4 mg  0.4 mg Intravenous Q5 Min PRN Mauri Brooklyn, MD       ??? nicotine (NICODERM CQ) 14 mg/24 hr patch 1 patch  1 patch Transdermal Daily Mauri Brooklyn, MD   1 patch at 04/15/18 0820   ??? nicotine polacrilex (NICORETTE) gum 2 mg  2 mg Buccal Q1H PRN Mauri Brooklyn, MD        Or   ??? nicotine polacrilex (NICORETTE) lozenge 2 mg  2 mg Buccal Q1H PRN Mauri Brooklyn, MD       ??? nicotine polacrilex (NICORETTE) gum 2 mg  2 mg Buccal Q2H PRN [facility], AGNP   2 mg at 04/09/18 1445   ??? ondansetron (ZOFRAN) injection 4 mg  4 mg Intravenous Q6H PRN Ignatius Specking, MD       ??? ondansetron (ZOFRAN-ODT) disintegrating tablet 8 mg  8 mg Oral Q8H PRN Mauri Brooklyn, MD       ??? pantoprazole (PROTONIX) EC tablet 20 mg  20 mg Oral Daily Mauri Brooklyn, MD   20 mg at 04/15/18 1478   ??? polyethylene glycol (MIRALAX) packet 17 g  17 g Oral Daily PRN Mauri Brooklyn, MD       ??? predniSONE (DELTASONE) tablet 30 mg  30 mg Oral Daily [facility], AGNP   30 mg at 04/15/18 2956    Followed by   ??? [START ON 04/18/2018] predniSONE (DELTASONE) tablet 15 mg  15 mg Oral Daily [facility], AGNP        Followed by   ??? [START ON 04/21/2018] predniSONE (DELTASONE) tablet 10 mg  10 mg Oral Daily [facility], AGNP        Followed by   ??? [START ON 04/24/2018] predniSONE (DELTASONE) tablet 5 mg  5 mg Oral Daily [facility], AGNP       ??? senna (SENOKOT) tablet 2 tablet  2 tablet Oral BID Mauri Brooklyn, MD   2 tablet at 04/15/18 0820   ??? sodium chloride (NS) 0.9 % infusion  10 mL/hr Intravenous Continuous Ignatius Specking, MD 10 mL/hr at 04/14/18 1958 10 mL/hr at 04/14/18 1958         Objective:     Vital Signs    Temp:  [36.6 ??C-37.3 ??C] 37.1 ??C  Heart Rate:  [109-118] 115  Resp:  [16-18] 18  BP: (128-178)/(91-101) 132/92  SpO2:  [93 %-97 %] 94 %      Physical Exam    GENERAL:  The patient is a well developed, well-nourished male and appears to be in no apparent distress. The patient is pleasant and interactive. Drowsy, sitting on edge of bed eating lunch.   HEAD/NECK:    Reveals normocephalic/atraumatic. Mucous membranes are moist.   CARDIOVASCULAR:   Tachycardic  LUNGS:   Normal work of breathing on room air  EXTREMITIES:  No clubbing, cyanosis, or edema was  noted.  NEUROLOGIC:    The patient was alert and oriented times four with normal language, attention, cognition and memory.   MUSCULOSKELETAL:    Deferred  SKIN:  No obvious rashes lesions or erythema  PSY:  Appropriate affect and mood.    Test Results    All lab results last 24 hours:  No results found for this or any previous visit (from the past 24 hour(s)).    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

## 2018-04-15 NOTE — Unmapped (Signed)
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 *        Pressure Ulcer(s)    Active Pressure Ulcer     None                 Russell [name] is a 57 y.o. male who presented to [facility] with Cancer related pain.  ??  Opioid refractory pain in the setting of metastatic RCC: Appreciate palliative care and acute pain service assistance with pain management. Not a candidate for radiation at this time due to widespread disease.  His pain has been difficult to control; minimal improvement with Methadone and Dilaudid PCA. Started Ketamine infusion yesterday, 5/14. Overnight patient with some delirium so continuous rate on PCA was stopped. Today, patient appears more comfortable than prior encounters but reports he feels confused on the Ketamine and it is dangerous.  Additionally, he reports his pain is still 10/10.  He says he thinks he could manage his pain effectively at home without IV medication.    - Dilaudid PCA demand dose 0.9 q .   - Methadone 10 TID  - Gabapentin 900 TID  - Ketamine infusion per   - lidoderm patches  - Tylenol 1G q6 (will decrease to 1G TID at discharge)  - prednisone taper per oncology  ??  Diffusely metastatic renal cell carcinoma with disease progression. Oncology has stopped cabozantinib given disease progression.    - Prednisone taper per oncology  - Protonix 20 mg while on prednisone   - page oncology at discharge for outpatient follow up  ??  Polysubstance abuse: UDS positive for cocaine, Dr. Legrand Como aware.    Constipation (resolved):  Reports daily BM on current bowel regimen  - continue Senna, PEG prn    Leukoplakia:  Magic mouthwash q6h prn     Tobacco Use:   - Nicotine patch, nicotine gum PRN    HTN:   - Continue home amlodipine Prophylaxis: lovenox 40??  Disposition: Home when pain controlled    ___________________________________________________________________    Subjective:  Patient appears more comfortable than on prior exams but still reports 10/10 pain.  He feels he would be able to manage pain at home on oral medication.  Reports feeling confused from Ketamine but able to hold appropriate conversation.     Labs/Studies:  Labs per EMR and Reviewed (last 24hrs)    Objective:  Temp:  [36.6 ??C (97.8 ??F)-37.3 ??C (99.2 ??F)] 37.1 ??C (98.8 ??F)  Heart Rate:  [109-118] 115  Resp:  [16-18] 18  BP: (128-178)/(91-101) 132/92  SpO2:  [93 %-97 %] 94 %     GEN: Sitting up in chair, NAD  HEENT: sclera anicteric, MMM  CV: RRR, no murmur  RESP: CTAB, normal WOB  ABD: soft, NT ND BS normoactive  EXT: no LE edema  NEURO: non-focal

## 2018-04-15 NOTE — Unmapped (Signed)
I reached out to [place] and explained that I am covering for his nurse navigator today. She stated that since she sent her mychart message she has met with the inpatient team and her questions were answered. She stated that they are working on weaning him off his drips and setting him up for home.  She is concern he may not make f/u appt if he is still inpatient and I let her know that we can work appt after d/c. I told her that I would relay this to [place].  Rodman Pickle, RN

## 2018-04-15 NOTE — Unmapped (Signed)
Pt disoriented to place upon this RN's initial shift assessment; Hospitalist paged, decision made to discontinue continuous PCA dose. Bolus dose and lockout interval remain the same. Pt less disoriented upon later reassessment. Pt tachycardic and hypertensive this shift, but below page parameters. Also endorses persistent pain, PCA and Ketamine drip both remain in place. Pt tearful at the beginning of this shift regarding his diagnosis and treatment plan, stating that he doesn't feel like anything is being done for him other than pain management; this RN reassured pt. Remains free from falls this shift. Pt currently resting in bed with eyes closed, equal chest rise and fall noted. Will continue to monitor.     Problem: Adult Inpatient Plan of Care  Goal: Plan of Care Review  Outcome: Progressing  Goal: Patient-Specific Goal (Individualization)  Outcome: Progressing  Goal: Absence of Hospital-Acquired Illness or Injury  Outcome: Progressing  Goal: Optimal Comfort and Wellbeing  Outcome: Progressing  Goal: Readiness for Transition of Care  Outcome: Progressing  Goal: Rounds/Family Conference  Outcome: Progressing     Problem: Fall Injury Risk  Goal: Absence of Fall and Fall-Related Injury  Outcome: Progressing

## 2018-04-16 LAB — COMPREHENSIVE METABOLIC PANEL
ALBUMIN: 2.6 g/dL — ABNORMAL LOW (ref 3.5–5.0)
ALKALINE PHOSPHATASE: 180 U/L — ABNORMAL HIGH (ref 38–126)
ALT (SGPT): 32 U/L (ref 19–72)
ANION GAP: 6 mmol/L — ABNORMAL LOW (ref 9–15)
AST (SGOT): 102 U/L — ABNORMAL HIGH (ref 19–55)
BILIRUBIN TOTAL: 0.6 mg/dL (ref 0.0–1.2)
BLOOD UREA NITROGEN: 21 mg/dL (ref 7–21)
BUN / CREAT RATIO: 38
CALCIUM: 7.9 mg/dL — ABNORMAL LOW (ref 8.5–10.2)
CHLORIDE: 101 mmol/L (ref 98–107)
CO2: 28 mmol/L (ref 22.0–30.0)
CREATININE: 0.56 mg/dL — ABNORMAL LOW (ref 0.70–1.30)
EGFR MDRD NON AF AMER: >=60 mL/min/{1.73_m2} (ref >=60)
GLUCOSE RANDOM: 91 mg/dL (ref 65–179)
POTASSIUM: 4.2 mmol/L (ref 3.5–5.0)
PROTEIN TOTAL: 5.2 g/dL — ABNORMAL LOW (ref 6.5–8.3)
SODIUM: 135 mmol/L (ref 135–145)

## 2018-04-16 LAB — CO2: Carbon dioxide:SCnc:Pt:Ser/Plas:Qn:: 28

## 2018-04-16 MED ORDER — GABAPENTIN 300 MG CAPSULE
ORAL_CAPSULE | Freq: Three times a day (TID) | ORAL | 0 refills | 0.00000 days | Status: SS
Start: 2018-04-16 — End: 2018-05-08

## 2018-04-16 MED ORDER — METHADONE 5 MG TABLET
ORAL_TABLET | Freq: Three times a day (TID) | ORAL | 0 refills | 0.00000 days | Status: CP
Start: 2018-04-16 — End: 2018-04-22

## 2018-04-16 MED ORDER — OXYCODONE 30 MG TABLET
ORAL_TABLET | ORAL | 0 refills | 0 days | Status: CP | PRN
Start: 2018-04-16 — End: 2018-04-22

## 2018-04-16 MED ORDER — EVEROLIMUS (ANTINEOPLASTIC) 5 MG TABLET
Freq: Every day | ORAL | 5 refills | 0.00000 days | Status: CP
Start: 2018-04-16 — End: 2018-04-16

## 2018-04-16 MED ORDER — PREDNISONE 5 MG TABLET
ORAL_TABLET | 0 refills | 0 days | Status: CP
Start: 2018-04-16 — End: 2018-05-09

## 2018-04-16 MED ORDER — OXYCODONE 30 MG TABLET: 30 mg | tablet | 0 refills | 0 days | Status: AC

## 2018-04-16 MED ORDER — LENVATINIB 18 MG/DAY (10 MG X 1 AND 4 MG X 2) CAPSULE: 18 mg | capsule | Freq: Every day | 5 refills | 0 days | Status: AC

## 2018-04-16 MED ORDER — LIDOCAINE 5 % TOPICAL PATCH
MEDICATED_PATCH | TRANSDERMAL | 0 refills | 0.00000 days | Status: CP
Start: 2018-04-16 — End: 2018-04-16

## 2018-04-16 MED ORDER — LENVATINIB 18 MG/DAY (10 MG X 1 AND 4 MG X 2) CAPSULE
Freq: Every day | ORAL | 5 refills | 0.00000 days | Status: CP
Start: 2018-04-16 — End: 2018-04-16

## 2018-04-16 MED ORDER — LIDOCAINE 5 % TOPICAL PATCH: 1 | patch | 0 refills | 0 days | Status: AC

## 2018-04-16 MED ORDER — EVEROLIMUS (ANTINEOPLASTIC) 5 MG TABLET: 5 mg | tablet | Freq: Every day | 5 refills | 0 days | Status: AC

## 2018-04-16 MED ORDER — NICOTINE (POLACRILEX) 2 MG GUM
BUCCAL | 0 refills | 0 days | Status: CP | PRN
Start: 2018-04-16 — End: 2018-05-09

## 2018-04-16 MED ORDER — ACETAMINOPHEN 500 MG TABLET
ORAL_TABLET | Freq: Three times a day (TID) | ORAL | 0 refills | 0.00000 days
Start: 2018-04-16 — End: ?

## 2018-04-16 MED ORDER — METHADONE 5 MG TABLET: 15 mg | tablet | Freq: Three times a day (TID) | 0 refills | 0 days | Status: AC

## 2018-04-16 NOTE — Unmapped (Signed)
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    1. Symptom Assessment and Recommendations:      1) Cancer associated left hip pain and shoulder pain: The patient has diffuse osseous metastases. He has pain in his right shoulder and left hip with no clear areas that are amenable for further XRT. He was transiently tried for 24hrs on Ketamine with mild improvement in his pain control allowing weaning of his dilaudid PCA. He has had limited further improvement with the use of Steroids and is currently on a prednisone taper. He has been at times sleeping on the at his friends home and would benefit from a hospital bed at home if this can be facilitated. I have updated Dr. Legrand Como of the events of this admission and the patient has an appointment with him on 5/21.   - Discharge on Methadone 15mg  TID ( transition on 5/15)  - Oxycodone 30mg  q3hr PRN for breakthrough  - Please prescribe 1 week of Methadone and Oxycodone  - Complete Predinsone taper as outpatinet  - The patient has Naloxone prescription at home  - Please place order for hospital bed for home  - Gabapentin 900mg  TID  - Lidocaine patch  - Reduce Tylenol to 1g q8h on discharge      2) Substance Misuse: The patient has had repeated urine toxicology screens that have been positive for cocaine. His last use was on 5/6 and he has had ongoing discussions with Dr. Legrand Como in the outpatient setting and understands the further positive urine toxicology will preclude further narcotic prescriptions.      2. Communication and ACP:  -- Prognosis and Understanding: The patient has previously expressed his knowledge of the incurable nature of his underlying malignancy and understands that there will be further change in therapy given his recent progression of disease. He is adamant that his prior cocaine use will no longer be an issue. He feels his pain is better controlled over the course of this admission and that he is hopeful he will have better success in managing this as an outpatient to help facilitate his cancer directed therapies.    -- Goals of Care and Decision Making:   Decision Maker at Visit: Patient. The patient ultimately wants to return home with his friend Russell [name]. He is keen to pursue further cancer directed therap    Thank you for this consult. Please page  [phone]  or Palliative Care [phone]) if there are any questions.     Subjective:    New Events:    No persistent exacerbation of pain since switching to oral breakthrough. States symptoms in the morning are most severe and alleviated with the oxycodone from 7 to 3/10. Feels more lucid today and in particular felt disoriented with the use of Ketamine and would not want this in the future.    Symptom Severity and Assessment:  (0 = No symptom --> 10 = Most Severe)  Pain: Currently 3/10 in severity, exacerbated to 10/10 transiently on waking in the  morning  Shortness of Breath: Nil  Secretions/Congestion: Nil  Nausea/Vomiting: Denies  Constipation: No bowel movement for 48hrs, denies any abdominal pain or discomfort. Passing flatus  Fatigue: Energy has been an issue  Sleep: Is not sleeping well at times of pain exacerbations, sleeps on the floor at times at his friends home      Pertinent Medications:   Methadone 15mg  TID  Oxycodone 30mg  q4hr PRN    Objective:     Function:  70% - Ambulation: Reduced / unable to do normal work, some evidence of disease / Self-Care: Full / Intake: Normal or reduced / Level of Conscious: Full Temp:  [36.6 ??C (97.9 ??F)-38.3 ??C (100.9 ??F)] 36.8 ??C (98.3 ??F)  Heart Rate:  [100-122] 117  Resp:  [16-22] 22  BP: (126-181)/(63-99) 126/80  SpO2:  [92 %-96 %] 94 %      Physical Exam:  GEN: Appears comfortable, 6/10 in the morning and improved over time  HEENT: Pupils equally round without scleral icterus  CV: Regular rhythm with normal rate, no murmurs  LUNGS: Clear to auscultation bilaterally without wheeze or rhonchi  ZOX:[id number] tenderness to palpation and allodynia in his right shoulder, able to weight bear, significant tenderness to light touch of skin left    ABD: Soft and non-tender with no distention or palpable masses. Bowel sounds are hypoactive  PYSCH: oriented to person, place and time,  EXT: No edema noted of the lower extremity      Test Results:  Lab Results   Component Value Date    WBC 4.3 (L) 04/15/2018    RBC 3.49 (L) 04/15/2018    HGB 9.5 (L) 04/15/2018    HCT 29.1 (L) 04/15/2018    MCV 83.3 04/15/2018    MCH 27.1 04/15/2018    MCHC 32.5 04/15/2018    RDW 17.9 (H) 04/15/2018    PLT 219 04/15/2018     Lab Results   Component Value Date    NA 135 04/16/2018    K 4.2 04/16/2018    CL 101 04/16/2018    CO2 28.0 04/16/2018    BUN 21 04/16/2018    CREATININE 0.56 (L) 04/16/2018    GLU 91 04/16/2018    CALCIUM 7.9 (L) 04/16/2018    ALBUMIN 2.6 (L) 04/16/2018    PHOS 3.5 02/23/2018      Lab Results   Component Value Date    ALKPHOS 180 (H) 04/16/2018    BILITOT 0.6 04/16/2018    PROT 5.2 (L) 04/16/2018    ALBUMIN 2.6 (L) 04/16/2018    ALT 32 04/16/2018    AST 102 (H) 04/16/2018       New Imaging: Nil New      Total time spent with patient for evaluation & management: 30 Minutes     Greater than 50% time spent on counseling/coordination of care:  No.   See ACP Note from today for additional billable service:  No.

## 2018-04-16 NOTE — Unmapped (Signed)
Problem: Adult Inpatient Plan of Care  Goal: Plan of Care Review  Outcome: Progressing  Goal: Patient-Specific Goal (Individualization)  Outcome: Progressing  Goal: Absence of Hospital-Acquired Illness or Injury  Outcome: Progressing  Goal: Optimal Comfort and Wellbeing  Outcome: Progressing  Goal: Readiness for Transition of Care  Outcome: Progressing     Problem: Fall Injury Risk  Goal: Absence of Fall and Fall-Related Injury  Outcome: Progressing     Problem: Pain Acute  Goal: Optimal Pain Control  Outcome: Progressing   PT HR tachy. BP high  in the middle of the night, tremor. AOX4. Slow response sometime. Pain medication gave, BP WNL in am.  Bed alarm on, Free of fall. Pain medication gave per pt's request. No acute stress noticed. Continue POC.

## 2018-04-16 NOTE — Unmapped (Signed)
Palliative Care Progress Note    Consultation from Requesting Attending Physician:  Georgiann Mccoy, MD  Primary Care Provider:  Marc Morgans ROSE, MD    Code Status: Full  Advance Directive Status: Nil    Assessment/Recommendation:   SUMMARY:  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    1. Symptom Assessment and Recommendations:      1) Cancer associated left hip pain and shoulder pain: The patient has diffuse osseous metastases. He has pain in his right shoulder and left hip with no clear areas that are amenable for further XRT. He was transiently tried for 24hrs on Ketamine with mild improvement in his pain control allowing weaning of his dilaudid PCA. He has had limited further improvement with the use of Steroids and is currently on a prednisone taper. He has been at times sleeping on the at his friends home and would benefit from a hospital bed at home if this can be facilitated. I will liaise with Dr. Legrand Como at the point of discharge to facilitate his ongoing palliative care management.    - Stop Ketamine and Dilaudid PCA today  - Increase Methadone 15mg  TID (prior transition on 5/11)  - Oxycodone 30mg  q3hr PRN for breakthrough  - Please place order for hospital bed  - Gabapentin 900mg  TID  - Lidocaine patch  - Tylenol 1g q6h      2) Substance Misuse: The patient has had repeated urine toxicology screens that have been positive for cocaine. His last use was on 5/6 and he has had ongoing discussions with Dr. Legrand Como in the outpatient setting and understands the further positive urine toxicology will preclude further narcotic prescriptions.    3) Nutrition: The patient's oral intake at home and here has been poor and will benefit from regular Ensure supplements. 2. Communication and ACP:  -- Prognosis and Understanding: The patient has previously expressed his understanding of the incurable nature of his underlying RCC and the aim is to change therapy based on his progression of disease. He is particularly frustrated with lack of response to iv ketamine and is accepting of the change in analgesia to oral regimen to assess response.    -- Goals of Care and Decision Making:   Decision Maker at Visit: Patient. The patient ultimately wants to return home with his friend [name]. He needs formal notarization of his HCPOA forms.     Thank you for this consult. Please page  [phone]  or Palliative Care [phone]) if there are any questions.     Subjective:    New Events:  Was transiently hallucinating with ketamine over the course of yesterday. Is wanting to stop the infusion. He is keen to stop the ketamine given his transient hallucination.    Symptom Severity and Assessment:  (0 = No symptom --> 10 = Most Severe)  Pain: Hallucinating at times with ketamine. Pain was transiently down to 2/10, however multiple unpredictable spikes particularly when sleeping. Wants to have the Ketamine infusion taken off.  Shortness of Breath: Nil  Secretions/Congestion: Nil  Nausea/Vomiting: Denies  Constipation: No bowel movements for 24hrs  Fatigue: Is able to ambulate around the unit without difficulty.  Sleep: Is not sleeping well at times of pain exacerbations      Pertinent Medications:  Methadone 15mg  TID    Objective:     Function:  70% - Ambulation: Reduced / unable to do normal work, some evidence of disease / Self-Care: Full / Intake: Normal or reduced / Level of Conscious: Full    Temp:  [36.6 ??C (97.9 ??F)-37.1 ??C (98.8 ??F)] 36.7 ??C (98.1 ??F)  Heart Rate:  [100-118] 100  Resp:  [16-18] 16  BP: (132-156)/(63-97) 152/92  SpO2:  [94 %-97 %] 94 %      Physical Exam:  GEN: Appears comfortable, states pain is 4/10 at time of examination with ketamine infusion still running.   HEENT: Pupils equally round without scleral icterus  CV: Regular rhythm with normal rate, no murmurs  LUNGS: Clear to auscultation bilaterally without wheeze or rhonchi  ZOX:WRUEAVW tenderness to palpation and allodynia in his right shoulder, able to weight bear, significant tenderness to light touch of skin left    ABD: Soft and non-tender with no distention or palpable masses. Bowel sounds are hypoactive  PYSCH: oriented to person, place and time,  EXT: No edema noted of the lower extremity      Test Results:  Lab Results   Component Value Date    WBC 4.3 (L) 04/15/2018    RBC 3.49 (L) 04/15/2018    HGB 9.5 (L) 04/15/2018    HCT 29.1 (L) 04/15/2018    MCV 83.3 04/15/2018    MCH 27.1 04/15/2018    MCHC 32.5 04/15/2018    RDW 17.9 (H) 04/15/2018    PLT 219 04/15/2018     Lab Results   Component Value Date    NA 135 04/15/2018    K 4.2 04/15/2018    CL 99 04/15/2018    CO2 24.0 04/15/2018    BUN 21 04/15/2018    CREATININE 0.48 (L) 04/15/2018    GLU 165 04/15/2018    CALCIUM 8.6 04/15/2018    ALBUMIN 3.0 (L) 04/15/2018    PHOS 3.5 02/23/2018      Lab Results   Component Value Date    ALKPHOS 205 (H) 04/15/2018    BILITOT 0.6 04/15/2018    PROT 5.6 (L) 04/15/2018    ALBUMIN 3.0 (L) 04/15/2018    ALT 34 04/15/2018    AST 157 (H) 04/15/2018       New Imaging: Nil New      Total time spent with patient for evaluation & management: 30 Minutes     Greater than 50% time spent on counseling/coordination of care:  No.   See ACP Note from today for additional billable service:  No.

## 2018-04-16 NOTE — Unmapped (Signed)
Pt A&Ox4, forgetful when on pain gtts, but has been more neurologically clear since they have been d/c. Currently states pain 3/10, appears comfortable-- early in the shift was 10/10. Pain controlled with PO meds. Up ad lib. See flowsheet for detailed assessment and MAR for meds given. No other concerns at this time. Will continue to monitor.      Problem: Adult Inpatient Plan of Care  Goal: Plan of Care Review  Outcome: Progressing  Goal: Patient-Specific Goal (Individualization)  Outcome: Progressing  Goal: Absence of Hospital-Acquired Illness or Injury  Outcome: Progressing  Goal: Optimal Comfort and Wellbeing  Outcome: Progressing  Goal: Readiness for Transition of Care  Outcome: Progressing  Goal: Rounds/Family Conference  Outcome: Progressing     Problem: Fall Injury Risk  Goal: Absence of Fall and Fall-Related Injury  Outcome: Progressing

## 2018-04-16 NOTE — Unmapped (Signed)
Met patient's caregiver [name] during gas card distribution. She is requesting charitable foundation assistance with [employer].    Russell [name] is a 57 yo patient of Drs. 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 Russell [name] is currently admitted for pain control. He continues to receive chemotherapy.    Russell [name] is disabled. His only income is $550 SSI. He has Medicaid for insurance. Patient lives with long-time friend Russell [name]. Russell [name] is patient???s primary caregiver. Russell [name] and Russell [name] are experiencing extreme financial hardship because of travel, meals and parking expenses during Russell [name]???s hospitalization.    Russell [name] is seeking assistance with paying a $59.48 [employer] bill. [employer] made a $59.48 electronic payment on Russell [name]'s account.  Russell [name] notified by phone.    Patient has my contact information and will contact me if I can be of additional assistance.    Cindie Crumbly, JD  [facility] Patient Assistance Coordinator

## 2018-04-16 NOTE — Unmapped (Signed)
Physician Discharge Summary [facility]  [address]  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 [facility] Course:   [name] is a 57 year old male with diffusely metastatic RCC admitted for uncontrolled pain. His hospital course is outlined below.     Opioid refractory pain in the setting of metastatic RCC: Appreciate palliative care and acute pain service assistance with pain management. Not a candidate for radiation at this time due to widespread disease.  Over the course of admission his pain remained difficult to control; minimal improvement with Methadone and Dilaudid PCA. Started Ketamine infusion 5/14 however this was stopped the following day because patient didn't like the way it made him feel.  His PCA was also discontinued at that time.  He was ultimately discharge on Methadone 15 mg TID (last increased 5/15) along with oxycodone 30 mg q3 hours PRN, gabapentin 900 mg TID, acetaminophen 1G TID, and Lidocaine patches. His prednisone is being tapered. He will follow up with palliative care as an outpatient.   ??  Diffusely metastatic renal cell carcinoma with disease progression. Oncology has stopped cabozantinib given disease progression.  He will follow up with oncology as an outpatient.   ??  Polysubstance abuse: UDS positive for cocaine, primary palliative care provider is aware.   ??  Constipation (resolved):  Reported daily BM on bowel regimen with Senna, PEG prn  ??  Leukoplakia:  Magic mouthwash q6h prn   ??  Tobacco Use:   Nicotine patch, nicotine gum PRN  ??  HTN:  Continued home amlodipine    Procedures:     No admission procedures for hospital encounter.  ______________________________________________________________________  Discharge Medications:     Your Medication List      STOP taking these medications    aluminum-magnesium hydroxide-simethicone 200-200-20 mg/5 mL Susp 80 mL, diphenhydrAMINE 12.5 mg/5 mL Liqd 200 mg, nystatin 100,000 unit/mL Susp 8,000,000 Units, distilled water Liqd 80 mL     cabozantinib 40 mg Tab        START taking these medications    acetaminophen 500 MG tablet  Commonly known as:  TYLENOL  Take 2 tablets (1,000 mg total) by mouth three (3) times a day (at 6am, noon and 6pm).     nicotine 14 mg/24 hr patch  Commonly known as:  NICODERM CQ  Place 1 patch on the skin daily.  Start taking on:  04/17/2018     nicotine polacrilex 2 mg gum  Commonly known as:  NICORETTE  Apply 1 each (2 mg total) to cheek every hour as needed for smoking cessation.        CHANGE how you take these medications    gabapentin 300 MG  capsule  Commonly known as:  NEURONTIN  Take 3 capsules (900 mg total) by mouth Three (3) times a day.  What changed:  how much to take     methadone 5 MG tablet  Commonly known as:  DOLOPHINE  Take 3 tablets (15 mg total) by mouth three (3) times a day (at 6am, noon and 6pm).  What changed:    ?? how much to take  ?? when to take this     oxyCODONE 30 MG immediate release tablet  Commonly known as:  ROXICODONE  Take 1 tablet (30 mg total) by mouth every four (4) hours as needed for pain.  What changed:    ?? medication strength  ?? how much to take     predniSONE 5 MG tablet  Commonly known as:  DELTASONE  30 mg 5/17, 15 mg x3 days, 10 mg x3 days, 5mg  x3 days,  What changed:    ?? medication strength  ?? how much to take  ?? how to take this  ?? when to take this  ?? additional instructions        CONTINUE taking these medications    albuterol 90 mcg/actuation inhaler  Commonly known as:  PROVENTIL HFA;VENTOLIN HFA  Inhale 2 puffs.     amLODIPine 5 MG tablet  Commonly known as:  NORVASC  Take 1 tablet (5 mg total) by mouth daily.     ibuprofen 600 MG tablet  Commonly known as:  ADVIL,MOTRIN  Take 1 tablet (600 mg total) by mouth every eight (8) hours as needed for pain.     lidocaine 5 % patch  Commonly known as:  LIDODERM  Place 1 patch on the skin daily. Apply to affected area for 12 hours only each day (then remove patch)     lidocaine-diphenhydramine-aluminum-magnesium 200-25-400-40 mg/30 mL Mwsh  Commonly known as:  FIRST-MOUTHWASH BLM  SWISH, GARGLE & SPIT BY MOUTH 1 TEASPOONFUL EVERY 6 HOURS AS NEEDED. MAY BE SWALLOWED IF ESOPHAGEAL INVOLVEMENT     naloxone nasal spray  Commonly known as:  NARCAN  One spray in either nostril once for known/suspected opioid overdose. May repeat every 2-3 minutes in alternating nostril til EMS arrives     omeprazole 20 MG capsule  Commonly known as:  PriLOSEC  Take 20 mg by mouth daily. TAKE 1 CAPSULE (20 MG TOTAL) BY MOUTH DAILY.     ondansetron 4 MG disintegrating tablet  Commonly known as:  ZOFRAN-ODT  Take 2 tablets (8 mg total) by mouth every eight (8) hours as needed for nausea.     SPIRIVA WITH HANDIHALER 18 mcg inhalation capsule  Generic drug:  tiotropium  Place 18 mcg into inhaler and inhale.            Allergies:  Patient has no known allergies.  ______________________________________________________________________  Pending Test Results (if blank, then none):      Most Recent Labs:  All lab results last 24 hours -   Recent Results (from the past 24 hour(s))   Comprehensive Metabolic Panel    Collection Time: 04/15/18  1:05 PM   Result Value Ref Range    Sodium 135 135 - 145 mmol/L    Potassium 4.2 3.5 - 5.0 mmol/L    Chloride 99 98 - 107 mmol/L    CO2 24.0 22.0 - 30.0 mmol/L    BUN 21 7 - 21 mg/dL Creatinine 1.61 (L) 0.96 - 1.30 mg/dL    BUN/Creatinine Ratio 44     EGFR  MDRD Non Af Amer >=60 >=60 mL/min/1.57m2    EGFR MDRD Af Amer >=60 >=60 mL/min/1.55m2    Anion Gap 12 9 - 15 mmol/L    Glucose 165 65 - 179 mg/dL    Calcium 8.6 8.5 - 69.6 mg/dL    Albumin 3.0 (L) 3.5 - 5.0 g/dL    Total Protein 5.6 (L) 6.5 - 8.3 g/dL    Total Bilirubin 0.6 0.0 - 1.2 mg/dL    AST 295 (H) 19 - 55 U/L    ALT 34 19 - 72 U/L    Alkaline Phosphatase 205 (H) 38 - 126 U/L   CBC    Collection Time: 04/15/18  1:05 PM   Result Value Ref Range    WBC 4.3 (L) 4.5 - 11.0 10*9/L    RBC 3.49 (L) 4.50 - 5.90 10*12/L    HGB 9.5 (L) 13.5 - 17.5 g/dL    HCT 28.4 (L) 13.2 - 53.0 %    MCV 83.3 80.0 - 100.0 fL    MCH 27.1 26.0 - 34.0 pg    MCHC 32.5 31.0 - 37.0 g/dL    RDW 44.0 (H) 10.2 - 15.0 %    MPV 7.4 7.0 - 10.0 fL    Platelet 219 150 - 440 10*9/L   Comprehensive Metabolic Panel    Collection Time: 04/16/18  6:35 AM   Result Value Ref Range    Sodium 135 135 - 145 mmol/L    Potassium 4.2 3.5 - 5.0 mmol/L    Chloride 101 98 - 107 mmol/L    CO2 28.0 22.0 - 30.0 mmol/L    BUN 21 7 - 21 mg/dL    Creatinine 7.25 (L) 0.70 - 1.30 mg/dL    BUN/Creatinine Ratio 38     EGFR MDRD Non Af Amer >=60 >=60 mL/min/1.99m2    EGFR MDRD Af Amer >=60 >=60 mL/min/1.40m2    Anion Gap 6 (L) 9 - 15 mmol/L    Glucose 91 65 - 179 mg/dL    Calcium 7.9 (L) 8.5 - 10.2 mg/dL    Albumin 2.6 (L) 3.5 - 5.0 g/dL    Total Protein 5.2 (L) 6.5 - 8.3 g/dL    Total Bilirubin 0.6 0.0 - 1.2 mg/dL    AST 366 (H) 19 - 55 U/L    ALT 32 19 - 72 U/L    Alkaline Phosphatase 180 (H) 38 - 126 U/L       Relevant Studies/Radiology (if blank, then none):  Xr Tibia Fibula Left    Result Date: 04/08/2018  EXAM: XR TIBIA FIBULA LEFT DATE: 04/08/2018 9:24 AM ACCESSION: [id number] UN DICTATED: 04/08/2018 9:30 AM INTERPRETATION LOCATION: [facility] CLINICAL INDICATION: 57 years old Male with pain-history of metastatic renal cancer. COMPARISON: Bilateral thigh MRI examination performed 02/27/2018. Same-day radiographs of the bilateral femora. TECHNIQUE: AP and lateral views of the left tibia and fibula. FINDINGS: No fracture. Bone density is preserved. No lytic or sclerotic osseous lesions. The knee and ankle remain approximated. MRI examination of the left tibia fibula may be considered for further evaluation for further characterization given extensive intramedullary metastatic disease seen on prior MRI of the bilateral thighs.     No fracture or malalignment. No lytic or sclerotic osseous lesion of the left tibia or fibula.    Xr Tibia Fibula Right    Result Date: 04/08/2018  EXAM: XR TIBIA FIBULA RIGHT DATE: 04/08/2018 9:23 AM ACCESSION: [id number] UN DICTATED: 04/08/2018 9:30 AM INTERPRETATION LOCATION: [facility] CLINICAL INDICATION: 57 years old Male with  pain-  COMPARISON: Concurrent left lower extremity radiographs TECHNIQUE: AP and lateral views of the right tibia and fibula. FINDINGS: No acute fracture. Distal femoral sclerotic lesion, as seen on prior imaging. Irregular lucent lesion in the right proximal tibia, likely representing the site of metastasis as seen on prior MRI. Small osseous body at the tibial spine which may be an intra-articular body /degenerative. Knee and ankle remain approximated. Mild distal soft tissue swelling.     Distal femur and proximal tibia osseous lesions, likely representing metastases. No evidence for acute fracture or pathologic fracture.    Ct Chest W Contrast    Result Date: 04/08/2018  EXAM: CT CHEST W CONTRAST DATE: 04/08/2018 6:03 AM ACCESSION: [id number] UN DICTATED: 04/08/2018 7:17 AM INTERPRETATION LOCATION: [facility] CLINICAL INDICATION: 57 years old Male with Malig Neoplasm (other)- h/o metastatic renal cell ca to bone, radiation pneumonitits-  COMPARISON: Concurrent CT abdomen pelvis and prior studies TECHNIQUE: A spiral CT scan was obtained with IV contrast from the thoracic inlet through the hemidiaphragms. Images were reconstructed in the axial plane.  Coronal and sagittal reformatted images of the chest were also provided for further evaluation of the lung parenchyma. FINDINGS: AIRWAYS, LUNGS, PLEURA: Unchanged right upper lobe patchy opacities within the bronchiectasis and volume loss; compatible with radiation pneumonitis. Interval worsening of multifocal bilateral pulmonary nodules, nodular septal thickening and nodularity along the major fissures.Reference left lower lobe 1.0 cm nodule (5:66), unchanged. Scrotal. Right lower lobe masslike opacity is similar to prior.  New trace, right greater than left, bilateral pleural effusions. MEDIASTINUM: Normal heart size.  No pericardial effusion.   Normal caliber thoracic aorta.  1.3 cm left lower paratracheal node (5:37). 1.9 cm left hilar node (5:50). 1.6 cm right hilar node (5:52). Additional subcentimeter mediastinal nodes. IMAGED ABDOMEN: Better evaluate on concurrent CT abdomen pelvis. SOFT TISSUES: Unremarkable. BONES: Diffuse osseous metastases of the C7 compression deformity.Marland Kitchen     -- Interval worsening of multifocal bilateral lung nodules and nodularity along the bilateral major fissures as compared to the prior CT dated 03/16/2018; the primary differential consideration is for worsening metastatic disease. --Interval appearance of nodular septal thickening and worsening nodularity along the fissures; raises the possibility of lymphangitic spread of disease. -- Similar-appearing right lower lobe consolidation. -- Unchanged mediastinal and bilateral hilar lymphadenopathy; concerning for nodal metastases. --New trace bilateral pleural effusions.    Ct Abdomen Pelvis With Oral And Iv Contrast    Result Date: 04/08/2018  EXAM: CT abdomen and pelvis with contrast DATE: 04/08/2018 6:03 AM ACCESSION: [id number] UN DICTATED: 04/08/2018 6:39 AM INTERPRETATION LOCATION: [facility] CLINICAL INDICATION: 57 years old Male with OTHER- metastatic renal cancer with increasing pain-  COMPARISON: Concurrent chest CT and CT chest abdomen pelvis 02/23/2018 TECHNIQUE: A spiral CT scan was obtained with IV contrast from the lung bases to the pubic symphysis.  Images were reconstructed in the axial plane. Coronal and sagittal reformatted images were also provided for further evaluation. FINDINGS: LOWER CHEST: Better evaluated on concurrent CT chest. ABDOMEN/PELVIS: HEPATOBILIARY: 4 mm enhancing focus in the liver (1:29), unchanged. No biliary ductal dilatation. Gallbladder is decompressed. PANCREAS: Unremarkable. SPLEEN: Unremarkable. ADRENAL GLANDS: Unremarkable. KIDNEYS/URETERS: Exophytic heterogeneously enhancing mass arising from the lower pole right kidney measuring up to 6.2 x 5.0 cm (1:67), unchanged. Focal calcifications noted throughout the right lower pole. Left renal hypodensity too small to characterize. BLADDER: Unremarkable. BOWEL/PERITONEUM/RETROPERITONEUM:  Mild diverticulosis. Redundant sigmoid colon. No evidence of bowel obstruction. Oral contrast is seen to the level of the descending colon. No ascites.  VASCULATURE: Abdominal aorta within normal limits for patient's age. Unremarkable inferior vena cava. LYMPH NODES: Subcentimeter but prominent retroperitoneal lymph nodes. REPRODUCTIVE ORGANS: Unremarkable. BONES/SOFT TISSUES: Expansile lytic lesion in the right iliac wing with pathologic fracture. Multiple additional sclerotic and lytic lesions are similar to prior in the axial and appendicular skeleton..     --No acute intra-abdominal pathology. -- Redemonstration of right exophytic renal cell carcinoma with numerous osseous metastases including pathologic fracture of the right iliac wing. -- Indeterminate 4 mm hypervascular focus in segment 3 of liver is unchanged. In the setting of renal cell carcinoma, this may represent a hepatic metastases, however, small size limits further evaluation. MRI may be helpful if clinically warranted. --Please refer to concurrent CT chest for findings above the diaphragm.    Xr Femur 2 Views Bilateral Result Date: 04/08/2018  EXAM: XR FEMUR 2 VIEWS BILATERAL DATE: 04/08/2018 8:15 AM ACCESSION: [id number] UN DICTATED: 04/08/2018 8:19 AM INTERPRETATION LOCATION: [facility] CLINICAL INDICATION: 57 years old Male with metastatic lesions-history of metastatic renal cancer. Osseous metastatic disease. COMPARISON: CT of the chest abdomen and pelvis performed 04/08/2017. MRI of the bilateral thighs performed 02/27/2018. TECHNIQUE: AP and lateral views of the femora. FINDINGS: No fracture or malalignment. Degree of extensive metastatic disease of the bilateral femora as appreciated on prior 02/27/2018 MRI examination is less conspicuous on this examination. The known left proximal femur intramedullary lesion causes mild endosteal scalloping of the proximal lateral femoral cortex.  Sclerotic lesion in the distal right femur is noted in keeping with metastatic disease as demonstrated on prior examination. No periosteal reaction, or evidence of extraosseous extension. The hips and knees remain approximated.     No acute fracture or malalignment. Extensive metastatic lesions throughout the bilateral femora most prominent in the left proximal femur and distal right femur. Right distal femur lesion appears slightly more sclerotic.    Mri Cervical Thoracic Lumbar Spine W Wo Contrast    Result Date: 04/09/2018  EXAM: Magnetic resonance imaging, spine, without and with contrast material, cervical, thoracic and lumbar. DATE: 04/09/2018 9:56 AM ACCESSION: [id number] UN DICTATED: 04/09/2018 10:57 AM INTERPRETATION LOCATION: [facility] CLINICAL INDICATION: 57 years old Male with OTHER- R/o cord compression-  COMPARISON: 04/18/2018 CT and earlier TECHNIQUE: Multiplanar MRI was performed through the cervical, thoracic, and lumbar spine prior to and following intravenous contrast administration. FINDINGS: Heterogeneous T1-hypointense T2-hyperintense signal with associated enhancement is seen throughout the vertebral column, affecting nearly every vertebra both anterior and posterior elements. Visualized pelvic bones are also affected. The No extraosseous soft tissue components identified. Nonacute C7 compression fracture with T2-hypointense signal consistent with sclerosis as seen on prior CTs. Mild multilevel disc bulging in the cervical and thoracic spine. Disc desiccation with small central disc protrusion at L4-5. No canal stenosis. No high-grade foraminal stenosis. No abnormal spinal cord signal. No leptomeningeal enhancement.     --Extensive widespread osseous metastatic disease. --No acute compression fracture. --No canal stenosis. --Unchanged nonacute C7 compression deformity.    ______________________________________________________________________  Discharge Instructions:   Activity Instructions     Activity as tolerated            Diet Instructions     Discharge diet (specify)      Discharge Nutrition Therapy:  General              Follow Up instructions and Outpatient Referrals     Call MD for:  difficulty breathing, headache or visual disturbances      Call MD for:  extreme fatigue  Call MD for:  hives      Call MD for:  persistent dizziness or light-headedness      Call MD for:  persistent nausea or vomiting      Call MD for:  severe uncontrolled pain      Call MD for: Temperature > 38.5 Celsius ( > 101.3 Fahrenheit)      Discharge instructions      You will be contacted with an oncology appointment .  If you need to reach the clinic, use the information listed below.   Appointments & Questions  Call [phone] to make an appointment, or to speak with an operator who can direct your call. (8 a.m. - 5 p.m., EST Monday - Friday)    You will be contacted with a follow up appointment with Palliative Care.  If you need to reach the clinic please call [phone].               Appointments which have been scheduled for you    Apr 21, 2018 11:30 AM EDT  (Arrive by 11:00 AM)  RETURN PALLIATIVE CARE with Sonny Masters, MD  [facility] HEMATOLOGY ONCOLOGY [facility]ND [facility]) [address]  [place] [address]  [phone]           ______________________________________________________________________  Discharge Day Services:  BP 126/80  - Pulse 117  - Temp 36.8 ??C (98.3 ??F) (Oral)  - Resp 22  - Ht 170.2 cm (5' 7.01)  - Wt 89.8 kg (197 lb 15.6 oz)  - SpO2 94%  - BMI 31.00 kg/m??   Pt seen on the day of discharge and determined appropriate for discharge.    Condition at Discharge: good    Length of Discharge: I spent greater than 30 mins in the discharge of this patient.

## 2018-04-17 MED ORDER — NICOTINE 14 MG/24 HR DAILY TRANSDERMAL PATCH
MEDICATED_PATCH | Freq: Every day | TRANSDERMAL | 0 refills | 0 days | Status: CP
Start: 2018-04-17 — End: 2018-05-09

## 2018-04-17 NOTE — Unmapped (Signed)
Spoke with [name] and let her know that we moved his appointment to 5/22 per her request. Confirmed appointment with her and let her know that pt will be seeing Ardean Larsen NP. Also let her know that we are trying to move appointment with Dr. Legrand Como to 5/22 and awaiting response from provider.

## 2018-04-17 NOTE — Unmapped (Signed)
-----   Message from Jennye Moccasin, MD sent at 04/17/2018 10:35 AM EDT -----  Regarding: RE: Follow up - inpatient  Copying my NN Soraya    Thanks  French Ana    ----- Message -----  From: Belva Bertin, RN  Sent: 04/17/2018  10:24 AM  To: Marica Otter, RN, Jennye Moccasin, MD  Subject: Annell Greening: Follow up - inpatient                        FYI from inpt heme onc fellow. We will see him for f/u next tues 5/21 and he has f/u with Rose on thurs 5/23.     Thanks  Allegra Lai   ----- Message -----  From: Benay Pike, MD  Sent: 04/16/2018  10:42 AM  To: Sonny Masters, MD, #  Subject: Follow up - inpatient                            Dear all,    I would like to update everyone on Mr. Arviso who has had a prolonged admission following exacerbation of his left hip and shoulder pain. On review of his imaging there were no areas amenable to local XRT. He had a protracted clinical course and was tried on a 24hr ketamine infusion with limited success.     On admission, he was on 10mg  BID of Methadone and we have escalated over a 10 day period to 15mg  TID with the most recent transition being on 5/15 (from 10mg  TID).     He will be going home with his friend [name] and we have re-emphasized avoidance of illicit substances and ongoing urine toxicology monitoring.     An area that is to be addressed is possibly exploring an outpatient hospice visit. He will be having a change in his therapy given his disease progression.    Please let me know if you have any further concerns,    Thanks,    Vab   Hematology/Oncology Fellow

## 2018-04-17 NOTE — Unmapped (Signed)
[facility] Specialty Medication Referral: PA APPROVED    Medication (Brand/Generic): Lenvima 18mg  daily dose and Afinitor 5mg     Initial FSI Test Claim completed with resulted information below:   RAN A TEST CLAIM FOR AFINITOR 5MG ,anticipated copay is $3.00, was not able to run a test claim for   lenvima because the medication is too expensive and we are not able to break the package to run a test claim.  No PA required  Patient ABLE to fill at [facility]:  [facility]  Anticipated Copay: $3.00  Is anticipated copay with a copay card or grant? No    As Co-pay is under $100 defined limit, per policy there will be no further investigation of need for financial assistance at this time unless patient requests. This referral has been communicated to the provider and handed off to the [facility] team for further processing and filling of prescribed medication.   ______________________________________________________________________  Please utilize this referral for viewing purposes as it will serve as the central location for all relevant documentation and updates.

## 2018-04-17 NOTE — Unmapped (Signed)
-----   Message from Jennye Moccasin, MD sent at 04/16/2018  3:02 PM EDT -----  Regarding: RE: Follow up appointment  Soraya - could you let him know that I send a new Rx for change in cancer drugs to pharmacy and then I should maybe see him maybe Thursday next week before he starts    Thanks  French Ana    ----- Message -----  From: Arthur Holms, MD  Sent: 04/16/2018  10:53 AM  To: Elijah Birk, RN, Jennye Moccasin, MD, #  Subject: Follow up appointment                            Mayo Ao,    Mr. Burnham should be discharged home today, do you know when you'd like to see him back in clinic?     Thanks,  [employer]

## 2018-04-20 LAB — OPIATE, URINE, QUANTITATIVE
6-MONOACETYLMRPH: <20 ng/mL
CODEINE GC/MS CONF: <50 ng/mL
HYDROCODONE GC/MS CONF: 131 ng/mL
HYDROMORPHONE GC/MS CONF: 241 ng/mL
NORBUPRENORPHINE: <5 ng/mL
OPIATE INTERP: POSITIVE
OXYCODONE (GC/MS): 64 ng/mL

## 2018-04-20 LAB — NORBUPRENORPHINE: Lab: <5

## 2018-04-20 NOTE — Unmapped (Signed)
Spoke with Russell [name] and reinforced that Dr. Okey Dupre sent prescription for Afinitor and Lenvatinib. Instructed Russell [name] not to start taking it until after his appointment with Ardean Larsen. Russell [name] confirmed understanding.

## 2018-04-21 ENCOUNTER — Ambulatory Visit
Admit: 2018-04-21 | Discharge: 2018-04-21 | Payer: MEDICAID | Attending: Geriatric Medicine | Primary: Geriatric Medicine

## 2018-04-21 ENCOUNTER — Ambulatory Visit: Admit: 2018-04-21 | Discharge: 2018-04-21 | Payer: MEDICAID

## 2018-04-21 DIAGNOSIS — F1410 Cocaine abuse, uncomplicated: Secondary | ICD-10-CM

## 2018-04-21 DIAGNOSIS — F1199 Opioid use, unspecified with unspecified opioid-induced disorder: Secondary | ICD-10-CM

## 2018-04-21 DIAGNOSIS — Z515 Encounter for palliative care: Secondary | ICD-10-CM

## 2018-04-21 DIAGNOSIS — G893 Neoplasm related pain (acute) (chronic): Principal | ICD-10-CM

## 2018-04-21 DIAGNOSIS — C7951 Secondary malignant neoplasm of bone: Secondary | ICD-10-CM

## 2018-04-21 DIAGNOSIS — C649 Malignant neoplasm of unspecified kidney, except renal pelvis: Secondary | ICD-10-CM

## 2018-04-21 LAB — TOXICOLOGY SCREEN, URINE
AMPHETAMINE SCREEN URINE: <500
CANNABINOID SCREEN URINE: <20

## 2018-04-21 LAB — BARBITURATE SCREEN URINE: Lab: <200

## 2018-04-21 NOTE — Unmapped (Signed)
Genitourinary Oncology Clinic Follow-Up Note    Patient Name: Russell [name]  Encounter Date: 04/22/2018    Assessment/Plan:    57 y.o. with metastatic RCC (unknown histology) s/p ipi/nivo, and now with POD, on cabozantinib, and increasing pain. He is here in follow up today after recent D/C from the hospital were he was evaluated and treated for increasing pain and to discuss treatment options.     1. 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 He has intermediate risk disease at diagnosis based on time to initiation of treatment with PS status unknown. We have also reviewed the difference between clear cell vs non-clear cell. 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 MSI stable, 1 Mut burden.   We discussed the phase II trial of lenvatinib+ everolimus vs lenvatinib alone vs everolimus alone (Motzer et al, Lancet Oncol 2015) demonstrating an improvement in PFS with combination therapy compared with everolimus alone (14.6 vs 5.5 months, HR 0.40, p<0.01). Rate of gr3-4 AEs was 71% with combination treatment with most common AEs of diarrhea, proteinuria, and anemia. We reviewed other AEs including, but not limited to: anorexia, fatigue, n/v, cough, hypercholesterolemia, stomatitis, HTN, edema, abd pain, thyroid disorders, rash, hyperglycemia, fever, and arthralgia.   The combination is dosed lenvatinib 18mg /day and everolimus 5mg  po daily continuous x 28 day cycles, with repeat imaging q8weeks.    * to note EKG done today given hx of cocaine abuse to eval QTc (458). Will CTM on treatment     2. Pain: continues to be an issue. 7/10 today. He follows closely with Dr. Doreatha Martin and is on a pain contract given SA hx    3. Cough:  Resolved, treated with Augmentin x 5 days for presumed pneumonia    4. SA: recent positive urine tox screen. We discussed the implications of cocaine abuse, and the importance in abstaining in the setting of TKI administration, as well as, narcotic use. He follows with palliative and has agreed to be seen weekly with tox screens to ensure that he is not using non-prescribed agents.       Plan:  - Discontinue cabozantinib  - Will start everolimus + lenvatinib- prescriptions previously sent    - Met with Katina Degree, Pharmacist, for additional medication counsel  - Pain management following-- Dr. Doreatha Martin  - will also see an addiction management speialist      Greater than 50% of today's 60 minute office visit was dedicated to face-to-face counseling on test results, diagnosis, treatment and prognosis-- Ardean Larsen, FNP-BC      History of Present Illness:    Russell [name] is a 57 y.o. male with metastatic RCC who is seen in follow-up for his RCC.    He initially presented with shoulder pain in the summer 2018 and was found to have a large renal mass with metastatic disease to lung, LN and bone. This was quickly followed by right lung collapse and rapid POD in the lung LN. Biopsy of his RUL tumor showed RCC (subtype not specified). 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 He then had progressively worsening right hip pain and was found to have new right femur and iliac bone mets, s/p XRT completed 01/2018 (800cGy x 1).  He then started cabozantinib 01/08/18 complicated by HTN which was treated with amlodipine.     Now with POD noted on imaging he will transition to lenvatinb + everolimus.    Today he reports continued global pain. He was recently discharged from the hospital where he was evaluated for SOB and increasing pain. He was treated for pneumonia with augmentin x 5 days, and was seen by palliative care for adjustments in pain regimen. His breathing has improved but he wear supplemental O2 as needed. He will see palliative today for pain management. He continues to be depressed and does not have a lot to say today. Coping skills are poor. He is accompanied by his family.     Past Medical History:  Past Medical History:   Diagnosis Date   ??? Bone metastasis (CMS-HCC)    ??? Renal cancer (CMS-HCC)    COPD    Medications:    Current Outpatient Medications on File Prior to Visit   Medication Sig Dispense Refill   ??? amLODIPine (NORVASC) 5 MG tablet Take 1 tablet (5 mg total) by mouth daily. 90 tablet 5   ??? gabapentin (NEURONTIN) 300 MG capsule Take 3 capsules (900 mg total) by mouth Three (3) times a day. 270 capsule 0   ??? omeprazole (PRILOSEC) 20 MG capsule Take 20 mg by mouth daily. TAKE 1 CAPSULE (20 MG TOTAL) BY MOUTH DAILY.  6   ??? tiotropium (SPIRIVA WITH HANDIHALER) 18 mcg inhalation capsule Place 18 mcg into inhaler and inhale.     ??? acetaminophen (TYLENOL) 500 MG tablet Take 2 tablets (1,000 mg total) by mouth three (3) times a day (at 6am, noon and 6pm). 30 tablet 0   ??? albuterol (PROVENTIL HFA;VENTOLIN HFA) 90 mcg/actuation inhaler Inhale 2 puffs.     ??? everolimus, antineoplastic, (AFINITOR) 5 mg tablet Take 1 tablet (5 mg total) by mouth daily. 30 tablet 5   ??? ibuprofen (ADVIL,MOTRIN) 600 MG tablet Take 1 tablet (600 mg total) by mouth every eight (8) hours as needed for pain. 60 tablet 2   ??? lenvatinib 18 mg/day (10 mg x 1-4 mg x2) cap Take 18 mg by mouth daily. 90 capsule 5   ??? lidocaine (LIDODERM) 5 % patch Place 1 patch on the skin daily. Apply to affected area for 12 hours only each day (then remove patch) 90 patch 0   ??? naloxone (NARCAN) 4 mg nasal spray One spray in either nostril once for known/suspected opioid overdose. May repeat every 2-3 minutes in alternating nostril til EMS arrives 1 each 0   ??? nicotine (NICODERM CQ) 14 mg/24 hr patch Place 1 patch on the skin daily. 28 patch 0   ??? nicotine polacrilex (NICORETTE) 2 mg gum Apply 1 each (2 mg total) to cheek every hour as needed for smoking cessation. (Patient not taking: Reported on 05/01/2018) 110 each 0   ??? ondansetron (ZOFRAN-ODT) 4 MG disintegrating tablet Take 2 tablets (8 mg total) by mouth every eight (8) hours as needed for nausea. 60 tablet 3   ??? predniSONE (DELTASONE) 5 MG tablet 30 mg 5/17, 15 mg x3 days, 10 mg x3 days, 5mg  x3 days, (Patient not taking: Reported on 05/01/2018) 24 tablet 0     No current facility-administered medications on file prior to visit.      Allergies:  No Known Allergies    Social History:  Lives in [place] with his long-time friend, who accompanies him today. 40 py h/o smoking. Not working.  Family History:  Grandmother sisters all died of some cancers. Paternal uncle had cancer but unknown type.    Review of Systems: A 10 system review was completed and was negative except per HPI    Physical Exam:  Vitals:    04/22/18 1216   BP: 124/71   Pulse: (S) 105   Resp: 16   Temp: 36.8 ??C (98.3 ??F)   SpO2: 94%     ECOG PS 2    General:   No acute distress, alert and interactive   Eyes:   Extra ocular muscles intact.  Sclera not icteric.   ENT:  Oropharynx with occasional blister in the mouth.   Neck:  Supple, no thyromegaly.   Lymph Nodes:  No adenopathy (cervical, axillary, inguinal)   Cardiovascular:  RRR without murmurs, rubs, gallops   Lungs:  Clear to auscultation bilaterally except for occasional crackles.   Skin:    No rash, lesions or breakdown    Abdomen:   Abdomen soft, not tender and not distended, no hepatosplenomegaly or masses.   Extremities:   Warm and well-perfused with pain over right femur with any palpation.   Neurological:  Alert and oriented to person, place and time.     Labs:    Reviewed in Epic    At diagnosis (07/10/17)  WBC 7.6 Hgb 14.8 Plt 251 ANC 4.9 (all normal)  Ca 8.7    Imaging:    02/02/18 Femur xray:  1. No acute or healing fracture.  2. Mild degenerative changes of the knee and hip.  3. Known metastases in the proximal right femur are not well seen by  x-ray.  4. Lytic lesion at the anterior iliac spine.    12/26/17 CT chest  1. Stable appearance of large mass in the lower pole of the right kidney.  2. Evolving postradiation changes in the right lung. Several enlarging and new nodular appearing areas in the right lung, as above. Whether or not these reflect evolving metastatic disease or evolving postradiation changes is uncertain. Continued attention on follow-up studies is recommended.  3. Extensive mediastinal and hilar lymphadenopathy redemonstrated, as above. Similar appearance of retroperitoneal lymphadenopathy compared to the prior study. No other new signs of metastatic disease elsewhere in the abdomen or pelvis.  4. Widespread metastatic disease to the bones very similar to the prior examination.  5. Aortic atherosclerosis, in addition to left anterior descending coronary artery disease. Please note that although the presence of coronary artery calcium documents the presence of coronary artery disease, the severity of this disease and any potential stenosis cannot be assessed on this non-gated CT examination. Assessment for potential risk factor modification, dietary therapy or pharmacologic therapy may be warranted, if clinically indicated.    12/15/17 MR right hip  Multifocal metastatic disease as described above. There are some  lesions in the right hip and acetabulum. Largest lesion is in the  right ilium. Negative for fracture. The exam is otherwise  Unremarkable.    02/23/18 CT CAP:  - Previously noted pulmonary nodules appear increased in number and size. The largest of these lesions is currently in the right lower lobe now a 2.9 x 4.5 cm mass (axial image 62 of series 4). An adjacent mass is also noted in the right lower lobe (axial image 57 of series 4) measuring 3.3 x 2.6 cm. Small right pleural effusion appears to be loculated posteriorly. Several left-sided pulmonary nodules have also increased in number and size, largest of which is in the anterior  aspect of the left lower lobe currently measuring 1.3 x 1.0 cm (axial image 68 of series 4).  - Slight progression of the primary lesion in the lower pole of the right kidney, now with loss of the intervening fat plane separating the lesion from the adjacent right psoas muscle, concerning for early or pending direct invasion of the psoas.    - Some regression of adjacent retrocaval lymphadenopathy  - New tiny hypovascular lesion in segment 3 of the liver which is nonspecific, but suspicious for potential metastatic lesion  - Previously noted mediastinal and hilar lymphadenopathy has slightly regressed compared to the prior study.   - Previously noted osseous metastatic disease appears very similar to the prior study.    MRI Brain 02/27/18  No acute intracranial abnormality.    MRI RLE 02/27/18  Extensive osseous metastatic disease of the of the visualized osseous structures (bilateral femurs and visualized lower pelvis). Plain radiographs of the femurs might be useful for further characterization degree of osseous erosion and better indication of potential impending pathologic fracture (especially in the region of the subtrochanteric proximal left femur).    Pathology:    (Outside case: [id number], 6 slides, collected on 07/10/2017)  ??  A. Bone, right iliac crest lytic lesion, CT-guided biopsy  - Metastatic carcinoma, compatible with renal primary (see comment)  ??  (Outside case: [id number], 2 slides, collected on 07/24/2017)  ??  A. Lung, right upper lobe, endobronchial biopsy  - Metastatic carcinoma, compatible with renal primary (see comment)    Immunohistochemical stains for both cases ([id number] and [id number]) were performed at the originating institution and are reviewed at [facility]:  [phone] (right iliac bone biopsy): Tumor cells are positive for PAX-8 and negative for GATA-3.   [id number] (right upper lobe lung biopsy): Tumor cells are positive for PAX-8. Additional stains performed were not sent for review. By report, the tumor was negative for CK7 and TTF-1.   ??  Given the clinical history of a large right renal mass, the morphology and immunoprofile for both of these metastatic lesions is most compatible with a metastasis from a renal primary. Correlation with clinical and imaging findings is recommended.

## 2018-04-22 ENCOUNTER — Ambulatory Visit: Admit: 2018-04-22 | Discharge: 2018-04-22 | Payer: MEDICAID | Attending: Family | Primary: Family

## 2018-04-22 ENCOUNTER — Ambulatory Visit
Admit: 2018-04-22 | Discharge: 2018-04-22 | Payer: MEDICAID | Attending: Geriatric Medicine | Primary: Geriatric Medicine

## 2018-04-22 DIAGNOSIS — Z515 Encounter for palliative care: Secondary | ICD-10-CM

## 2018-04-22 DIAGNOSIS — C7951 Secondary malignant neoplasm of bone: Principal | ICD-10-CM

## 2018-04-22 DIAGNOSIS — C649 Malignant neoplasm of unspecified kidney, except renal pelvis: Secondary | ICD-10-CM

## 2018-04-22 DIAGNOSIS — F1410 Cocaine abuse, uncomplicated: Principal | ICD-10-CM

## 2018-04-22 DIAGNOSIS — G893 Neoplasm related pain (acute) (chronic): Secondary | ICD-10-CM

## 2018-04-22 DIAGNOSIS — F1199 Opioid use, unspecified with unspecified opioid-induced disorder: Secondary | ICD-10-CM

## 2018-04-22 MED ORDER — OXYCODONE 30 MG TABLET
ORAL_TABLET | ORAL | 0 refills | 0.00000 days | Status: CP | PRN
Start: 2018-04-22 — End: 2018-05-02

## 2018-04-22 MED ORDER — DEXAMETHASONE 0.5 MG/5 ML ORAL SOLUTION
Freq: Four times a day (QID) | ORAL | 12 refills | 0.00000 days | Status: CP
Start: 2018-04-22 — End: 2018-05-09

## 2018-04-22 MED ORDER — METHADONE 10 MG TABLET
ORAL_TABLET | Freq: Three times a day (TID) | ORAL | 0 refills | 0 days | Status: CP
Start: 2018-04-22 — End: 2018-05-09

## 2018-04-22 MED ORDER — UREA 40 % TOPICAL CREAM
Freq: Two times a day (BID) | TOPICAL | 6 refills | 0 days | Status: CP
Start: 2018-04-22 — End: ?

## 2018-04-22 NOTE — Unmapped (Signed)
Clinical Pharmacist Practitioner: Genitourinary Clinic    New Start Lenvatinib/Everolimus Education    Russell [name] is a 57 y.o. male with metastatic RCC who I am counseling today on initiation of oral chemotherapy.    Oral chemotherapy regimen: Lenvatinib 18 mg daily/Everolimus 5 mg daily    Side effects discussed included but were not limited to: mucositis, diarrhea, hypertension, fatigue, hand-foot syndrome, edema, hyperglycemia, increased LDL and TG, bone marrow suppression, increased risk of infection, nephrotoxicity and pneumonitis.Side effect prevention and management were also reviewed including management of mucositis and laboratory monitoring.    Administration: I reviewed the importance of taking with a full glass of water and consistently with or without regard to food, as well as the importance of medication adherence.     Drug Interactions: Instructed the patient that this medication has potential drug interactions. The patient should inform me of any new prescriptions so that I may evaluate for potential drug interactions.other medications reviewed and up to date in Epic.  Risk of QTc prolongation with methadone and lenvatinib. Baseline EKG will be completed before starting therapy and checked periodically.     Storage requirements: this medicine should be stored at room temperature.     Handling precautions reviewed:  Patient was counseled on the hazards surrounding oral chemotherapy and will minimize contact/exposure to the medicine.    Comorbidities/Allergies: reviewed and up to date in Epic.    Handout provided: Chemotherapy handout from [facility]    Patient verbalized understanding of the above information as well as how to contact the Team with any questions/concerns.    Assessment/ Plan:    1) Initiate Lenvatinib 18 mg once daily (one 10 mg capsule + two 4 mg capsules) and everolimus 5 mg daily once daily   2) For mucositis prophylaxis with everolimus, initiate dexamethasone mouth rinses four times a day.   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      F/u:   Specific monitoring for lenvatinib includes:   ?? LFT every 2 weeks for 2 months and then monthly     Time with patient: 15 minutes    Rozanna Boer, PharmD, MSPS  PGY2 Hematology/Oncology Pharmacy Resident  Pager: [phone]

## 2018-04-22 NOTE — Unmapped (Signed)
[facility]  Comprehensive Cancer Support Program/Psychiatry   New Patient Evaluation - Outpatient    Name: Russell [name]  Date: 04/21/2018  MRN: [id number]  DOB: 1961/08/11  REFERRING PROVIDER: Jennye Moccasin, MD  ONCOLOGIST: Dr. 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  He was not very willing to engage with me and said that he did not need support. He denied depressed mood or anxiety. He did not verbalize to me that he was interested in therapy or treatment for his cocaine use disorder. He has my contact info and I can be available to me if he changes his mind or if other circumstances should change.      Risk Assessment:  A suicide and violence risk assessment was performed as part of this evaluation. There patient is deemed to be at chronic elevated risk for self-harm/suicide given the following factors: no history of significant relationship, recent onset of serious medical condition, current substance abuse, chronic severe medical condition, chronic mental illness > 5 years, past substance abuse and chronic poor judgment. There patient is deemed to be at chronic elevated risk for violence given the following factors: male gender, current substance abuse, no history of significant relationships, childhood abuse and past substance abuse. These risk factors are mitigated by the following factors:lack of active SI/HI, no know access to weapons or firearms, no history of previous suicide attempts , presence of an available support system, safe housing, support system in agreement with treatment recommendations and presence of a safety plan with follow-up care. There is no acute risk for suicide or violence at this time. The patient was educated about relevant modifiable risk factors including following recommendations for treatment of psychiatric illness and abstaining from substance abuse. While future psychiatric events cannot be accurately predicted, the patient does not currently require  acute inpatient psychiatric care and does not currently meet [facility] involuntary commitment criteria.      Diagnoses:   Patient Active Problem List   Diagnosis   ??? Metastatic renal cell carcinoma to bone (CMS-HCC)   ??? Substance abuse (CMS-HCC)   ??? Cancer related pain   ??? Leukoplakia of oral cavity   ??? Essential hypertension        Stressors: severe and terminal cancer, ongoing drug use     Disability Assessment Scale: WHODAS clinically estimated as 30-40       Plan:  1. Cocaine use disorder, ongoing   - Pt did not discuss with me a desire to quit or reduce. When discussing the risks of drug use along with his large doses of opioid medication, he minimized the risks and repeated that When you gotta go, you gotta go.   - Continue with palliative care in a effort to address his pain and his drug use in a harm reduction model     Patient was provided with information regarding available [facility] resources  Follow-up appointment for medication management and supportive psychotherapy as needed     Revised Medication(s) Post Visit:  Outpatient Encounter Medications as of 04/21/2018   Medication Sig Dispense Refill   ??? acetaminophen (TYLENOL) 500 MG tablet Take 2 tablets (1,000 mg total) by mouth three (3) times a day (at 6am, noon and 6pm). 30 tablet 0   ??? albuterol (  PROVENTIL HFA;VENTOLIN HFA) 90 mcg/actuation inhaler Inhale 2 puffs.     ??? amLODIPine (NORVASC) 5 MG tablet Take 1 tablet (5 mg total) by mouth daily. 90 tablet 5   ??? everolimus, antineoplastic, (AFINITOR) 5 mg tablet Take 1 tablet (5 mg total) by mouth daily. 30 tablet 5   ??? gabapentin (NEURONTIN) 300 MG capsule Take 3 capsules (900 mg total) by mouth Three (3) times a day. 270 capsule 0   ??? ibuprofen (ADVIL,MOTRIN) 600 MG tablet Take 1 tablet (600 mg total) by mouth every eight (8) hours as needed for pain. 60 tablet 2   ??? lenvatinib 18 mg/day (10 mg x 1-4 mg x2) cap Take 18 mg by mouth daily. 90 capsule 5   ??? lidocaine (LIDODERM) 5 % patch Place 1 patch on the skin daily. Apply to affected area for 12 hours only each day (then remove patch) 90 patch 0   ??? lidocaine-diphenhydramine-aluminum-magnesium (FIRST-MOUTHWASH BLM) 200-25-400-40 mg/30 mL Mwsh SWISH, GARGLE & SPIT BY MOUTH 1 TEASPOONFUL EVERY 6 HOURS AS NEEDED. MAY BE SWALLOWED IF ESOPHAGEAL INVOLVEMENT  0   ??? methadone (DOLOPHINE) 10 MG tablet TAKE 1 AND 1/2 TABLETS BY MOUTH THREE TIMES DAILY AT 6AM, NOON & 6PM  0   ??? methadone (DOLOPHINE) 5 MG tablet Take 3 tablets (15 mg total) by mouth three (3) times a day (at 6am, noon and 6pm). (Patient not taking: Reported on 04/21/2018) 63 tablet 0   ??? naloxone (NARCAN) 4 mg nasal spray One spray in either nostril once for known/suspected opioid overdose. May repeat every 2-3 minutes in alternating nostril til EMS arrives 1 each 0   ??? nicotine (NICODERM CQ) 14 mg/24 hr patch Place 1 patch on the skin daily. (Patient not taking: Reported on 04/21/2018) 28 patch 0   ??? nicotine polacrilex (NICORETTE) 2 mg gum Apply 1 each (2 mg total) to cheek every hour as needed for smoking cessation. (Patient not taking: Reported on 04/21/2018) 110 each 0   ??? omeprazole (PRILOSEC) 20 MG capsule Take 20 mg by mouth daily. TAKE 1 CAPSULE (20 MG TOTAL) BY MOUTH DAILY.  6   ??? ondansetron (ZOFRAN-ODT) 4 MG disintegrating tablet Take 2 tablets (8 mg total) by mouth every eight (8) hours as needed for nausea. 60 tablet 3   ??? oxyCODONE (ROXICODONE) 30 MG immediate release tablet Take 1 tablet (30 mg total) by mouth every four (4) hours as needed for pain. 42 tablet 0   ??? predniSONE (DELTASONE) 5 MG tablet 30 mg 5/17, 15 mg x3 days, 10 mg x3 days, 5mg  x3 days, 24 tablet 0   ??? tiotropium (SPIRIVA WITH HANDIHALER) 18 mcg inhalation capsule Place 18 mcg into inhaler and inhale.       No facility-administered encounter medications on file as of 04/21/2018.        Patient was provided with my contact information. He was instructed to call [phone] for emergencies.     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 He has a history of polysubstance use disorder. He was interview in clinic with is caregiver present per pt request. He did not initially want to talk with me but since he was drinking water in preparation for  leaving a urine sample, he did begin to engage with me.          When I explained who I was, he replied, I don't need support, I am doing fine. He says that he is sleeping well and eating well and his energy is improving. He says that he knows he has a bad cancer but It's no big deal, when you gotta go, you gotta go.         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 He is single with no children. His mother and father are all still living and he has 2 brothers and 1 sister. They are live very close to him except for his father who is in [place]. His caretaker is a long time family friend who used to work with his mother in a printing shop years ago.         He says that he grew up with lots of drugs and alcohol. He reports that his father used to fall asleep sitting up with a beer in his hands and then awaken and finish it. If you tried to take the beer out of his hand, he would wake up and grab it. He says that his family made moonshine. His grandmother regularly drank and smoked but then one day she quit. He says that he was told that not one could quit on their own but he saw that she did so he does not believe what health care providers say about drug use.  He reports using cocaine, MJ and acid a lot in his youth, but he stopped the MJ as it made him cough and he did not like the acid. He regularly uses cocaine and still does citing the high feeling he gets.     Allergies:  Patient has no known allergies.    Medications:   Current Outpatient Medications   Medication Sig Dispense Refill   ??? acetaminophen (TYLENOL) 500 MG tablet Take 2 tablets (1,000 mg total) by mouth three (3) times a day (at 6am, noon and 6pm). 30 tablet 0   ??? albuterol (PROVENTIL HFA;VENTOLIN HFA) 90 mcg/actuation inhaler Inhale 2 puffs.     ??? amLODIPine (NORVASC) 5 MG tablet Take 1 tablet (5 mg total) by mouth daily. 90 tablet 5   ??? everolimus, antineoplastic, (AFINITOR) 5 mg tablet Take 1 tablet (5 mg total) by mouth daily. 30 tablet 5   ??? gabapentin (NEURONTIN) 300 MG capsule Take 3 capsules (900 mg total) by mouth Three (3) times a day. 270 capsule 0   ??? ibuprofen (ADVIL,MOTRIN) 600 MG tablet Take 1 tablet (600 mg total) by mouth every eight (8) hours as needed for pain. 60 tablet 2   ??? lenvatinib 18 mg/day (10 mg x 1-4 mg x2) cap Take 18 mg by mouth daily. 90 capsule 5   ??? lidocaine (LIDODERM) 5 % patch Place 1 patch on the skin daily. Apply to affected area for 12 hours only each day (then remove patch) 90 patch 0   ??? lidocaine-diphenhydramine-aluminum-magnesium (FIRST-MOUTHWASH BLM) 200-25-400-40 mg/30 mL Mwsh SWISH, GARGLE & SPIT BY MOUTH 1 TEASPOONFUL EVERY 6 HOURS AS NEEDED. MAY BE SWALLOWED IF ESOPHAGEAL INVOLVEMENT  0   ??? methadone (DOLOPHINE) 10 MG tablet TAKE 1 AND 1/2 TABLETS BY MOUTH THREE TIMES DAILY AT 6AM, NOON & 6PM  0   ??? methadone (DOLOPHINE) 5 MG tablet Take 3 tablets (15 mg total) by mouth three (3) times a day (at 6am, noon  and 6pm). (Patient not taking: Reported on 04/21/2018) 63 tablet 0   ??? naloxone (NARCAN) 4 mg nasal spray One spray in either nostril once for known/suspected opioid overdose. May repeat every 2-3 minutes in alternating nostril til EMS arrives 1 each 0   ??? nicotine (NICODERM CQ) 14 mg/24 hr patch Place 1 patch on the skin daily. (Patient not taking: Reported on 04/21/2018) 28 patch 0   ??? nicotine polacrilex (NICORETTE) 2 mg gum Apply 1 each (2 mg total) to cheek every hour as needed for smoking cessation. (Patient not taking: Reported on 04/21/2018) 110 each 0   ??? omeprazole (PRILOSEC) 20 MG capsule Take 20 mg by mouth daily. TAKE 1 CAPSULE (20 MG TOTAL) BY MOUTH DAILY.  6   ??? ondansetron (ZOFRAN-ODT) 4 MG disintegrating tablet Take 2 tablets (8 mg total) by mouth every eight (8) hours as needed for nausea. 60 tablet 3   ??? oxyCODONE (ROXICODONE) 30 MG immediate release tablet Take 1 tablet (30 mg total) by mouth every four (4) hours as needed for pain. 42 tablet 0   ??? predniSONE (DELTASONE) 5 MG tablet 30 mg 5/17, 15 mg x3 days, 10 mg x3 days, 5mg  x3 days, 24 tablet 0   ??? tiotropium (SPIRIVA WITH HANDIHALER) 18 mcg inhalation capsule Place 18 mcg into inhaler and inhale.       No current facility-administered medications for this visit.        Psychiatric/Medical History:  Past Medical History:   Diagnosis Date   ??? Bone metastasis (CMS-HCC)    ??? Renal cancer (CMS-HCC)        Surgical History:  Past Surgical History:   Procedure Laterality Date   ??? FLEXIBLE BRONCHOSCOPY  07/24/2017   ??? HERNIA REPAIR         Social History:  Social History     Socioeconomic History   ??? Marital status: Single     Spouse name: Not on file   ??? Number of children: Not on file   ??? Years of education: Not on file   ??? Highest education level: Not on file   Occupational History   ??? Not on file   Social Needs   ??? Financial resource strain: Not on file   ??? Food insecurity:     Worry: Not on file     Inability: Not on file   ??? Transportation needs:     Medical: Not on file     Non-medical: Not on file   Tobacco Use   ??? Smoking status: Current Every Day Smoker     Packs/day: 1.00     Types: Cigarettes   ??? Smokeless tobacco: Never Used   Substance and Sexual Activity   ??? Alcohol use: Yes   ??? Drug use: Yes     Types: Cocaine   ??? Sexual activity: Not on file   Lifestyle   ??? Physical activity:     Days per week: Not on file     Minutes per session: Not on file   ??? Stress: Not on file   Relationships   ??? Social connections:     Talks on phone: Not on file     Gets together: Not on file     Attends religious service: Not on file     Active member of club or organization: Not on file     Attends meetings of clubs or organizations: Not on file     Relationship status: Not on file  Other Topics Concern   ??? Not on file   Social History Narrative   ??? Not on file       Family History:  The patient's family history includes Brain cancer in his paternal uncle..    ROS: The balance of 10 systems is negative except for the following: abdominal pain     Objective:     Vitals:   There were no vitals filed for this visit.    Mental Status Exam:  Appearance:    Appears stated age   Motor:   No abnormal movements   Speech/Language:    Normal rate, volume, tone, fluency   Mood:   fine   Affect:    Calm, cooperative, euthymic   Thought process:   Logical, linear, clear, coherent, goal directed   Thought content:     Denies SI, HI, self harm, delusions, obsessions, paranoid ideation, or ideas of reference   Perceptual disturbances:     Denies auditory and visual hallucinations, behavior not concerning for response to internal stimuli     Orientation:   Oriented to person, place, time, and general circumstances   Attention:   Able to fully attend without fluctuations in consciousness   Concentration:   Able to fully concentrate and attend   Memory:   Immediate, short-term, long-term, and recall grossly intact    Fund of knowledge:    Consistent with level of education and development   Insight:     Intact   Judgment:    Intact   Impulse Control:   Intact     PE:   General: in NAD  Neuro: CN II-XII intact.  No abnormal movements.  Gait is steady, normal-based.    Test Results:  Data Review: Lab results last 24 hours:  No results found for this or any previous visit (from the past 24 hour(s)).  Imaging: None    Marc Morgans, [place]   04/21/2018

## 2018-04-22 NOTE — Unmapped (Signed)
Comprehensive Cancer Support Program - Psychiatry Outpatient Clinic   After Visit Summary    Continue these medicines:none     Medication changes:    Our next appointment is on: as needed     [facility]: [phone].      Some good information about cancer can be found at: www.cancer.net    If you are taking any controlled substances (such as anxiety or sleep medications), you must use them as the directions say to use them. We cannot provide early refills, and it would be inappropriate to obtain the medications from other doctors. We routinely use the [place] controlled substance database to monitor prescription drug use.     If you need to get in touch with me for a non-urgent issue, the best way to do so is through sending a [facility] using the Patient Advice Request.    You can also contact Myrene Galas, the [facility] admin asst, at [phone] for an issue during business hours.    Or Marletta Lor, the scheduler at [phone] for any scheduling issues.    In order to receive updates on clinic closings due to adverse weather,   please call [phone].    The [place] crisis psychiatry line can be reached for after hours urgent issues at:   [phone]. You can also call the I need to talk line-- [phone]-TALK ([phone]) connects you to a skilled, trained counselor at a crisis center in your area, anytime 24/7.

## 2018-04-23 ENCOUNTER — Ambulatory Visit: Admit: 2018-04-23 | Discharge: 2018-04-24 | Payer: MEDICAID

## 2018-04-23 DIAGNOSIS — C7951 Secondary malignant neoplasm of bone: Principal | ICD-10-CM

## 2018-04-23 DIAGNOSIS — C649 Malignant neoplasm of unspecified kidney, except renal pelvis: Secondary | ICD-10-CM

## 2018-04-23 NOTE — Unmapped (Signed)
OUTPATIENT ONCOLOGY PALLIATIVE CARE    Principal Diagnosis: Russell [name] is a 57 y.o. 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 He then had XRT to right femur/hip completed 01/2018.  Patient had [place] hospitalization in 04/2018 for increased pain with evidence of disease progression.  PMH include cocaine use.    Assessment/Plan:    1. Pain:  Currently with left hip and leg pain, associated with bony metastases.. Pain management is complicated by patient's active cocaine abuse and use of non-prescribed opioids.  Patient was seen by rad onc in hospital - decision made not to pursue xrt given extensive bony disease and uncertain benefit from treatment.    Plan:  -- continue methadone 10 mg TID (QTC 450 a few weeks ago, plan periodic monitoring)  -- Continue Oxycodone 30 mg Q4hrs prn   -- continue 1 week opioid scripts - no early refills.    -- continue gabapentin 900 mg tid  -- advised that maximum dose of ibuprofen is 2,400 mg/day (800 tid).  Recommended that patient take omeprazole 20 mg qd  -- complete prednisone taper.    2.  Cocaine abuse/use of nonprescribed opioids: longstanding - discussed that open communication is essential.  He expresses interest in stopping use and dealing with stressors.  Has consistently had urine + for cocaine.  He states today that he has not used cocaine in two weeks since last visit.    Plan:  -- [facility]SP/psychiatry referral - has appt with Marianna Payment, PA today  -- continue urine tox monitoring - for substance use and to confirm appropriate opioid use - patient denies use of nonprescribed opioids.  Discussed that our team would not continue prescribing if he has another urine with non-prescribed opioids.      3. Controlled substances risk management.   - Patient has a signed pain medication agreement with Outpatient Palliative Care, completed on 03/17/18, as per standard of care.   - [place] database was reviewed today and it was appropriate.   - Urine drug screen was performed at this visit. Findings: pending.   - Patient has received information about safe storage and administration of medications.   - Patient has received a prescription for narcan.       F/u: 1-2 weeks    ----------------------------------------  Referring Provider: Salem Caster MD  Oncology Team: [facility]  PCP: [facility]      HPI: Russell [name] is a 57 y.o. 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 He then had XRT to right femur/hip completed 01/2018. He is now being treated with cabozantinib started  01/08/18 along with Prednisone 60mg  daily.    Today Russell [name] shares that he is aware his cancer has spread throughout his body, but is hoping his current chemotherapy will get rid of this stuff. He does want his providers to be direct with him about his prognosis, however, and while he shares he does not enjoy talking  about his cancer, would want his providers to be straight-forward and honest about his prognosis should his cancer fail to respond to current therapies. In terms of symptoms, Russell [name] shares his pain is worst in his right thigh and hip, feels like someone stabbing me with an ice pick, is 10/10 (NRS) at its worst, and 2/10 after taking 10mg  Oxycodone which he reports using three times a day. Pt shares that Ibuprofen and lidocaine patches are also helpful in alleviating pain somewhat, though not as effective as Oxycodone. He often wakes at night with pain. He shares he previously had poor appetite, but this has improved on increasing his Prednisone dose from 20 to 60mg  a day. He otherwise denies significant symptoms, other than mild nausea after taking his medications in the morning that is resolved with Zofran. He denies HA, SOB, chest pain, abdominal pain, persistent nausea, constipation, depression, anxiety, or any other symptoms.    Regarding advance care planning, Russell [name] shares he has not thought much about what kind of interventions he would or would not want were he to become seriously ill, though he does want his HCPOA to be his friends [name] and [name], not any of his immediate family members.    Interval history, 04/07/18, [facility]: follow up - friend [name] was present   -- ER visit in [facility] yesterday 2/2 running out of opioids a day early.  He was scheduled for CT scan - drank contrast but didn't stay 2/2 wait time - he states that his pain was increasing. Of note, he was seen in ER prior to CT and received dose of IV hydromorphone.  Patient c/o left hip, shin, knee pain.  Severity - worse than a kidney stone, currently 7 on 0-10 scale, has ranged from 3 to off the chart in the last 24 hours.  He has ms contin 60 mg tid and oxycodone 10 mg q 4 prn prescribed - when pain is severe, patient takes both more oxy and ms contin than prescribed.  Other analgesics - gabapentin TID (uncertain whether dose is 600 or 1200 mg), prednisone 60 mg qd (for concern re pneumonitis), ibuprofen (connie estimates that patient takes 2 tablets bid). He doesn't have pain all the time.  -- patient initially stated that his last cocaine use was 2 weeks ago - when informed that urine was then be negative for cocaine and that I would not prescribe unless he was open/honest with substance use, he reports using cocaine yesterday.  -- discussed urine tox from 2 weeks ago - hydrocodone present - patient denies taking - doesn't understand how could be in urine - of note, database does not have hydrocodone prescription - uncertain (though unlikely) prescription in ER  -- also had ER visit last week.    Interval history, 04/21/18, GW: follow up. Friend connie was present for this visit.  -- hours after last visit patient was hospitalized at [facility] for pain.  He had taken x1 dose of methadone 10 mg.  Imaging revealed no fracture, Extensive metastatic lesions throughout the bilateral femora most prominent in the left proximal femur and distal right femur.  He was treated with a PCA.  Methadone increased to 10 mg tid, then 15 mg tid.  Ketamine was ineffective.  He returned home 5 days ago - received message from [place] that he was more sedated - advised decreasing methadone to 10 mg tid.  He also taking oxycodone 30 mg q 4 hours, gabapentin 900 mg tid (patient isn't  certain whether this is helpful), ibuprofen 1000 mg bid-tid and is finishing a prednisone taper.  He uses a lidoderm patch last night.  -- denies reflux symptoms - hasn't been taking omeprazole which was prescribed.  -- now has a hospital bed at home which has been helpful  -- today, pain in left hip is the worst since he returned home from the hospital.  -- denies constipation, has BM qod, doesn't take laxatives - he's been eating fruit  -- has lost 5 lbs  -- states last cocaine use was 2 weeks ago (prior to hospitalization)  -- discussed that last urine demonstrated x2 unprescribed opioids.  -- has appt with NP Rod Can re consideration of next treatment option     Palliative Performance Scale: 70% - Ambulation: Reduced / unable to do normal work, some evidence of disease / Self-Care: Full / Intake: Normal or reduced / Level of Conscious: Full    Coping/Support Issues: Issues with previously documented cocaine use.    Goals of Care: Pt currently focused on symptom relief, though wishes to continue any available cancer-directed treatments.    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  Hobbies: Previously played softball, likes to shoot pool at bars with his mom.  Current residence / distance from [facility]: lives in [place]    Advance Care Planning:   HCPOA: Wishes to have friends [name] [phone]), and [name] [phone]).  Natural surrogate decision maker: Mother  Living Will: None  ACP note: No    Objective     Opioid Risk Tool:  ?? Male  Male    Family history of substance abuse      Alcohol  1  3    Illegal drugs  2  3    Rx drugs  4  4    Personal history of substance abuse      Alcohol  3  3    Illegal drugs  4  4    Rx drugs  5  5    Age between 3???45 years  1  1    History of preadolescent sexual abuse  3  0    Psychological disease      ADD, OCD, bipolar, schizophrenia  2  2    Depression  1  1    ??  Total: Not completed today. Pt high risk by definition with previous documented cocaine use and prior prescribers refusing to continue providing pain medications due to concern for misuse/diversion (see assessment).  (<3 low risk, 4-7 moderate risk, >8 high risk)     No history exists.       Patient Active Problem List   Diagnosis   ??? Metastatic renal cell carcinoma to bone (CMS-HCC)   ??? Substance abuse (CMS-HCC)   ??? Cancer related pain   ??? Leukoplakia of oral cavity   ??? Essential hypertension       Past Medical History:   Diagnosis Date   ??? Bone metastasis (CMS-HCC)    ??? Renal cancer (CMS-HCC)        Past Surgical History:   Procedure Laterality Date   ??? FLEXIBLE BRONCHOSCOPY  07/24/2017   ??? HERNIA REPAIR         Current Outpatient Medications   Medication Sig Dispense Refill   ??? acetaminophen (TYLENOL) 500 MG tablet Take 2 tablets (1,000 mg total) by mouth three (3)  times a day (at 6am, noon and 6pm). 30 tablet 0   ??? albuterol (PROVENTIL HFA;VENTOLIN HFA) 90 mcg/actuation inhaler Inhale 2 puffs.     ??? amLODIPine (NORVASC) 5 MG tablet Take 1 tablet (5 mg total) by mouth daily. 90 tablet 5   ??? everolimus, antineoplastic, (AFINITOR) 5 mg tablet Take 1 tablet (5 mg total) by mouth daily. (Patient not taking: Reported on 04/22/2018) 30 tablet 5   ??? gabapentin (NEURONTIN) 300 MG capsule Take 3 capsules (900 mg total) by mouth Three (3) times a day. 270 capsule 0   ??? ibuprofen (ADVIL,MOTRIN) 600 MG tablet Take 1 tablet (600 mg total) by mouth every eight (8) hours as needed for pain. 60 tablet 2   ??? lenvatinib 18 mg/day (10 mg x 1-4 mg x2) cap Take 18 mg by mouth daily. (Patient not taking: Reported on 04/22/2018) 90 capsule 5   ??? lidocaine (LIDODERM) 5 % patch Place 1 patch on the skin daily. Apply to affected area for 12 hours only each day (then remove patch) 90 patch 0   ??? naloxone (NARCAN) 4 mg nasal spray One spray in either nostril once for known/suspected opioid overdose. May repeat every 2-3 minutes in alternating nostril til EMS arrives 1 each 0   ??? omeprazole (PRILOSEC) 20 MG capsule Take 20 mg by mouth daily. TAKE 1 CAPSULE (20 MG TOTAL) BY MOUTH DAILY.  6   ??? ondansetron (ZOFRAN-ODT) 4 MG disintegrating tablet Take 2 tablets (8 mg total) by mouth every eight (8) hours as needed for nausea. 60 tablet 3   ??? predniSONE (DELTASONE) 5 MG tablet 30 mg 5/17, 15 mg x3 days, 10 mg x3 days, 5mg  x3 days, (Patient not taking: Reported on 04/22/2018) 24 tablet 0   ??? tiotropium (SPIRIVA WITH HANDIHALER) 18 mcg inhalation capsule Place 18 mcg into inhaler and inhale.     ??? dexamethasone (DECADRON) 0.5 mg/5 mL solution Take 10 mL (1 mg total) by mouth Four (4) times a day. Swish and spit. 500 mL 12   ??? lidocaine-diphenhydramine-aluminum-magnesium (FIRST-MOUTHWASH BLM) 200-25-400-40 mg/30 mL Mwsh SWISH, GARGLE & SPIT BY MOUTH 1 TEASPOONFUL EVERY 6 HOURS AS NEEDED. MAY BE SWALLOWED IF ESOPHAGEAL INVOLVEMENT  0   ??? methadone (DOLOPHINE) 10 MG tablet Take 1 tablet (10 mg total) by mouth every eight (8) hours. Fill on or after 04/23/18 21 tablet 0   ??? nicotine (NICODERM CQ) 14 mg/24 hr patch Place 1 patch on the skin daily. (Patient not taking: Reported on 04/21/2018) 28 patch 0   ??? nicotine polacrilex (NICORETTE) 2 mg gum Apply 1 each (2 mg total) to cheek every hour as needed for smoking cessation. (Patient not taking: Reported on 04/21/2018) 110 each 0   ??? oxyCODONE (ROXICODONE) 30 MG immediate release tablet Take 1 tablet (30 mg total) by mouth every four (4) hours as needed for pain. Fill on or after 04/23/18 42 tablet 0   ??? urea (CARMOL) 40 % cream Apply 1 application topically Two (2) times a day. To hands and feet. 198 g 6     No current facility-administered medications for this visit.        Allergies: No Known Allergies    Family History:  Cancer-related family history includes Brain cancer in his paternal uncle.  indicated that the status of his paternal uncle is unknown.      REVIEW OF SYSTEMS:  A comprehensive review of 14 systems was negative except for pertinent positives noted in HPI.  PHYSICAL EXAM:   Vital signs for this encounter: VS reviewed in EPIC.  GEN: Awake and alert, caucasian male sitting in chair, uncomfortable.  PSYCH: Alert and oriented to person, place and time. Euthymic.  HEENT: Pupils equally round without scleral icterus. No facial asymmetry.  LUNGS: No increased work of breathing, no audible stridor or wheeze.  ABD: Non-distended  SKIN: No rashes, petechiae or jaundice noted  EXT: No edema noted of the lower extremities  NEURO: No focal deficits noted    Lab Results   Component Value Date    CREATININE 0.56 (L) 04/16/2018     Lab Results   Component Value Date    ALKPHOS 180 (H) 04/16/2018    BILITOT 0.6 04/16/2018    PROT 5.2 (L) 04/16/2018    ALBUMIN 2.6 (L) 04/16/2018    ALT 32 04/16/2018    AST 102 (H) 04/16/2018        I personally spent over half of a total 30. minutes in counseling and discussion with the patient as described above.    Doreatha Martin, MD  [facility].

## 2018-04-23 NOTE — Unmapped (Signed)
OUTPATIENT ONCOLOGY PALLIATIVE CARE    Principal Diagnosis: Russell [name] is a 57 y.o. 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 He then had XRT to right femur/hip completed 01/2018.  Patient had [facility] in 04/2018 for increased pain with evidence of disease progression.  PMH include cocaine use.    Assessment/Plan:    1. Cocaine use/opioid use disorder:  Patient has longstanding cocaine use and he has not been able to communicate openly.  He also does not have interest in addressing his cocaine abuse.  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  Prognosis likely measured in months - d/w NP Brower.  Overall approach is to minimize harms associated with opioid use though have also stated that our team will not prescribe if urine demonstrates unprescribed opioids again.    Plan:  -- discussed option of substance abuse treatment.  Will provide patient information re services in his home community though do not anticipate that he will pursue.  -- discussed that current opioid doses will not be increased by our team with continued cocaine use, and that dose may be decreased if we have concerns re potential harms/side effects.  -- discussed that cocaine use may be impacting efficacy of opioid medications for pain  -- discussed with NP Rod Can my concerns re patient's ability to take oral chemotherapy given ongoing cocaine use.  -- f/u opioid confirmation testing    2.  Pain:  Currently with left hip and leg pain, associated with bony metastases.. Pain management is complicated by patient's active cocaine abuse and use of non-prescribed opioids.  Patient was seen by rad onc in hospital - decision made not to pursue xrt given extensive bony disease and uncertain benefit from treatment.    Plan:  -- continue methadone 10 mg TID (QTC 450 a few weeks ago, plan periodic monitoring)  -- Continue Oxycodone 30 mg Q4hrs prn   -- continue 1 week opioid scripts - no early refills.    -- continue gabapentin 900 mg tid  -- advised that maximum dose of ibuprofen is 2,400 mg/day (800 tid).  Recommended that patient take omeprazole 20 mg qd  -- complete prednisone taper.      3. Controlled substances risk management.   - Patient has a signed pain medication agreement with Outpatient Palliative Care, completed on 03/17/18, as per standard of care.   - [place] database was reviewed today and it was appropriate.   - Urine drug screen was performed at this visit. Findings: pending.   - Patient has received information about safe storage and administration of medications.   - Patient has received a prescription for narcan.       F/u: 1-2 weeks    ----------------------------------------  Referring Provider: Salem Caster MD  Oncology Team: [facility]  PCP: [facility]      HPI: Russell [name] is a 57 y.o. 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 Treatment has consisted of chest XRT 07/2017 and followed by ipi/nivo on 07/22/17 followed by nivolumab maintenance x 5, which was stopped due to radiation pneumonitis vs nivo-induced pneumonitis and he  was treated with steroids. He then had XRT to right femur/hip completed 01/2018. He is now being treated with cabozantinib started  01/08/18 along with Prednisone 60mg  daily.    Today Russell [name] shares that he is aware his cancer has spread throughout his body, but is hoping his current chemotherapy will get rid of this stuff. He does want his providers to be direct with him about his prognosis, however, and while he shares he does not enjoy talking about his cancer, would want his providers to be straight-forward and honest about his prognosis should his cancer fail to respond to current therapies. In terms of symptoms, Russell [name] shares his pain is worst in his right thigh and hip, feels like someone stabbing me with an ice pick, is 10/10 (NRS) at its worst, and 2/10 after taking 10mg  Oxycodone which he reports using three times a day. Pt shares that Ibuprofen and lidocaine patches are also helpful in alleviating pain somewhat, though not as effective as Oxycodone. He often wakes at night with pain. He shares he previously had poor appetite, but this has improved on increasing his Prednisone dose from 20 to 60mg  a day. He otherwise denies significant symptoms, other than mild nausea after taking his medications in the morning that is resolved with Zofran. He denies HA, SOB, chest pain, abdominal pain, persistent nausea, constipation, depression, anxiety, or any other symptoms.    Regarding advance care planning, Russell [name] shares he has not thought much about what kind of interventions he would or would not want were he to become seriously ill, though he does want his HCPOA to be his friends [name] and [name], not any of his immediate family members.    Interval history, 04/21/18, [facility]: follow up. Friend connie was present for this visit.  -- hours after last visit patient was hospitalized at [facility] for pain.  He had taken x1 dose of methadone 10 mg.  Imaging revealed no fracture, Extensive metastatic lesions throughout the bilateral femora most prominent in the left proximal femur and distal right femur.  He was treated with a PCA.  Methadone increased to 10 mg tid, then 15 mg tid.  Ketamine was ineffective.  He returned home 5 days ago - received message from [facility] that he was more sedated - advised decreasing methadone to 10 mg tid.  He also taking oxycodone 30 mg q 4 hours, gabapentin 900 mg tid (patient isn't certain whether this is helpful), ibuprofen 1000 mg bid-tid and is finishing a prednisone taper.  He uses a lidoderm patch last night.  -- denies reflux symptoms - hasn't been taking omeprazole which was prescribed.  -- now has a hospital bed at home which has been helpful  -- today, pain in left hip is the worst since he returned home from the hospital.  -- denies constipation, has BM qod, doesn't take laxatives - he's been eating fruit  -- has lost 5 lbs  -- states last cocaine use was 2 weeks ago (prior to hospitalization)  -- discussed that last urine demonstrated x2 unprescribed opioids.  -- has appt with NP Rod Can re consideration of next treatment option     Interval history, 04/22/18, GW: follow up. Friends [name] and Rosey Bath present for this visit with patient's permission  -- urine tox + for cocaine -- patient had denied cocaine use at yesterday's visit.  [name] states that she was not with the patient last weekend.  -- patient again stated that he has not taken unprescribed  opioids - both buprenorphine and hydrocodone were present on confirmation test from urine two weeks ago  -- patient had visit with Marianna Payment, PA from [facility] yesterday - expressed no interest in stopping cocaine use.    Palliative Performance Scale: 70% - Ambulation: Reduced / unable to do normal work, some evidence of disease / Self-Care: Full / Intake: Normal or reduced / Level of Conscious: Full    Coping/Support Issues: Issues with previously documented cocaine use.    Goals of Care: Pt currently focused on symptom relief, though wishes to continue any available cancer-directed treatments.    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  Hobbies: Previously played softball, likes to shoot pool at bars with his mom.  Current residence / distance from [facility]: lives in [place]    Advance Care Planning:   HCPOA: Wishes to have friends [name] [phone]), and [name] [phone]).  Natural surrogate decision maker: Mother  Living Will: None  ACP note: No    Objective     Opioid Risk Tool:  ?? Male  Male    Family history of substance abuse      Alcohol  1  3    Illegal drugs  2  3    Rx drugs  4  4    Personal history of substance abuse      Alcohol  3  3    Illegal drugs  4  4    Rx drugs  5  5    Age between 75???45 years  1  1    History of preadolescent sexual abuse  3  0    Psychological disease      ADD, OCD, bipolar, schizophrenia  2  2    Depression  1  1    ??  Total: Not completed today. Pt high risk by definition with previous documented cocaine use and prior prescribers refusing to continue providing pain medications due to concern for misuse/diversion (see assessment).  (<3 low risk, 4-7 moderate risk, >8 high risk)     No history exists.       Patient Active Problem List   Diagnosis   ??? Metastatic renal cell carcinoma to bone (CMS-HCC)   ??? Substance abuse (CMS-HCC)   ??? Cancer related pain   ??? Leukoplakia of oral cavity   ??? Essential hypertension       Past Medical History:   Diagnosis Date   ??? Bone metastasis (CMS-HCC)    ??? Renal cancer (CMS-HCC)        Past Surgical History:   Procedure Laterality Date   ??? FLEXIBLE BRONCHOSCOPY  07/24/2017   ??? HERNIA REPAIR         Current Outpatient Medications   Medication Sig Dispense Refill   ??? acetaminophen (TYLENOL) 500 MG tablet Take 2 tablets (1,000 mg total) by mouth three (3) times a day (at 6am, noon and 6pm). 30 tablet 0   ??? albuterol (PROVENTIL HFA;VENTOLIN HFA) 90 mcg/actuation inhaler Inhale 2 puffs.     ??? amLODIPine (NORVASC) 5 MG tablet Take 1 tablet (5 mg total) by mouth daily. 90 tablet 5   ??? dexamethasone (DECADRON) 0.5 mg/5 mL solution Take 10 mL (1 mg total) by mouth Four (4) times a day. Swish and spit. 500 mL 12   ??? everolimus, antineoplastic, (AFINITOR) 5 mg tablet Take 1 tablet (5 mg  total) by mouth daily. (Patient not taking: Reported on 04/22/2018) 30 tablet 5   ??? gabapentin (NEURONTIN) 300 MG capsule Take 3 capsules (900 mg total) by mouth Three (3) times a day. 270 capsule 0   ??? ibuprofen (ADVIL,MOTRIN) 600 MG tablet Take 1 tablet (600 mg total) by mouth every eight (8) hours as needed for pain. 60 tablet 2   ??? lenvatinib 18 mg/day (10 mg x 1-4 mg x2) cap Take 18 mg by mouth daily. (Patient not taking: Reported on 04/22/2018) 90 capsule 5   ??? lidocaine (LIDODERM) 5 % patch Place 1 patch on the skin daily. Apply to affected area for 12 hours only each day (then remove patch) 90 patch 0   ??? lidocaine-diphenhydramine-aluminum-magnesium (FIRST-MOUTHWASH BLM) 200-25-400-40 mg/30 mL Mwsh SWISH, GARGLE & SPIT BY MOUTH 1 TEASPOONFUL EVERY 6 HOURS AS NEEDED. MAY BE SWALLOWED IF ESOPHAGEAL INVOLVEMENT  0   ??? methadone (DOLOPHINE) 10 MG tablet Take 1 tablet (10 mg total) by mouth every eight (8) hours. Fill on or after 04/23/18 21 tablet 0   ??? naloxone (NARCAN) 4 mg nasal spray One spray in either nostril once for known/suspected opioid overdose. May repeat every 2-3 minutes in alternating nostril til EMS arrives 1 each 0   ??? nicotine (NICODERM CQ) 14 mg/24 hr patch Place 1 patch on the skin daily. (Patient not taking: Reported on 04/21/2018) 28 patch 0   ??? nicotine polacrilex (NICORETTE) 2 mg gum Apply 1 each (2 mg total) to cheek every hour as needed for smoking cessation. (Patient not taking: Reported on 04/21/2018) 110 each 0   ??? omeprazole (PRILOSEC) 20 MG capsule Take 20 mg by mouth daily. TAKE 1 CAPSULE (20 MG TOTAL) BY MOUTH DAILY.  6   ??? ondansetron (ZOFRAN-ODT) 4 MG disintegrating tablet Take 2 tablets (8 mg total) by mouth every eight (8) hours as needed for nausea. 60 tablet 3   ??? oxyCODONE (ROXICODONE) 30 MG immediate release tablet Take 1 tablet (30 mg total) by mouth every four (4) hours as needed for pain. Fill on or after 04/23/18 42 tablet 0   ??? predniSONE (DELTASONE) 5 MG tablet 30 mg 5/17, 15 mg x3 days, 10 mg x3 days, 5mg  x3 days, (Patient not taking: Reported on 04/22/2018) 24 tablet 0   ??? tiotropium (SPIRIVA WITH HANDIHALER) 18 mcg inhalation capsule Place 18 mcg into inhaler and inhale.     ??? urea (CARMOL) 40 % cream Apply 1 application topically Two (2) times a day. To hands and feet. 198 g 6     No current facility-administered medications for this visit.        Allergies: No Known Allergies    Family History:  Cancer-related family history includes Brain cancer in his paternal uncle.  indicated that the status of his paternal uncle is unknown.      REVIEW OF SYSTEMS:  A comprehensive review of 14 systems was negative except for pertinent positives noted in HPI.      PHYSICAL EXAM:   Vital signs for this encounter: VS reviewed in EPIC.  GEN: Awake and alert, caucasian male sitting in chair, uncomfortable.  PSYCH: Alert and oriented to person, place and time. Euthymic.  HEENT: Pupils equally round without scleral icterus. No facial asymmetry.  LUNGS: No increased work of breathing, no audible stridor or wheeze.  ABD: Non-distended  SKIN: No rashes, petechiae or jaundice noted  EXT: No edema noted of the lower extremities  NEURO: No focal deficits noted  Lab Results   Component Value Date    CREATININE 0.56 (L) 04/16/2018     Lab Results   Component Value Date    ALKPHOS 180 (H) 04/16/2018    BILITOT 0.6 04/16/2018    PROT 5.2 (L) 04/16/2018    ALBUMIN 2.6 (L) 04/16/2018    ALT 32 04/16/2018    AST 102 (H) 04/16/2018        I personally spent over half of a total 45. minutes in counseling and discussion with the patient as described above.    Doreatha Martin, MD  [facility] Out[facility] On[facility] Palliative [facility].

## 2018-04-24 ENCOUNTER — Ambulatory Visit
Admit: 2018-04-24 | Discharge: 2018-04-25 | Discharge status: Against Medical Advice | Payer: MEDICAID | Admitting: Geriatric Medicine

## 2018-04-24 DIAGNOSIS — G9340 Encephalopathy, unspecified: Principal | ICD-10-CM

## 2018-04-24 LAB — SMEAR - MD REQUEST

## 2018-04-24 LAB — CBC W/ AUTO DIFF
BASOPHILS RELATIVE PERCENT: 0.8 %
BASOPHILS RELATIVE PERCENT: 0.5 %
EOSINOPHILS ABSOLUTE COUNT: 0 10*9/L (ref 0.0–0.4)
EOSINOPHILS ABSOLUTE COUNT: 0 10*9/L (ref 0.0–0.4)
EOSINOPHILS RELATIVE PERCENT: 0.6 %
HEMATOCRIT: 20.7 % — ABNORMAL LOW (ref 41.0–53.0)
HEMATOCRIT: 20.1 % — ABNORMAL LOW (ref 41.0–53.0)
HEMOGLOBIN: 6.5 g/dL — ABNORMAL LOW (ref 13.5–17.5)
HEMOGLOBIN: 6.1 g/dL — ABNORMAL LOW (ref 13.5–17.5)
LARGE UNSTAINED CELLS: 3 % (ref 0–4)
LYMPHOCYTES ABSOLUTE COUNT: 0.7 10*9/L — ABNORMAL LOW (ref 1.5–5.0)
LYMPHOCYTES ABSOLUTE COUNT: 0.6 10*9/L — ABNORMAL LOW (ref 1.5–5.0)
LYMPHOCYTES RELATIVE PERCENT: 22.4 %
LYMPHOCYTES RELATIVE PERCENT: 23.2 %
MEAN CORPUSCULAR HEMOGLOBIN CONC: 31.5 g/dL (ref 31.0–37.0)
MEAN CORPUSCULAR HEMOGLOBIN CONC: 30.6 g/dL — ABNORMAL LOW (ref 31.0–37.0)
MEAN CORPUSCULAR HEMOGLOBIN: 27.1 pg (ref 26.0–34.0)
MEAN CORPUSCULAR HEMOGLOBIN: 25.8 pg — ABNORMAL LOW (ref 26.0–34.0)
MEAN CORPUSCULAR VOLUME: 86 fL (ref 80.0–100.0)
MEAN CORPUSCULAR VOLUME: 84.4 fL (ref 80.0–100.0)
MEAN PLATELET VOLUME: 8.5 fL (ref 7.0–10.0)
MONOCYTES ABSOLUTE COUNT: 0.2 10*9/L (ref 0.2–0.8)
MONOCYTES ABSOLUTE COUNT: 0.2 10*9/L (ref 0.2–0.8)
MONOCYTES RELATIVE PERCENT: 5.9 %
MONOCYTES RELATIVE PERCENT: 8.3 %
NEUTROPHILS ABSOLUTE COUNT: 2 10*9/L (ref 2.0–7.5)
NEUTROPHILS ABSOLUTE COUNT: 1.6 10*9/L — ABNORMAL LOW (ref 2.0–7.5)
NEUTROPHILS RELATIVE PERCENT: 66.8 %
PLATELET COUNT: 80 10*9/L — ABNORMAL LOW (ref 150–440)
PLATELET COUNT: 90 10*9/L — ABNORMAL LOW (ref 150–440)
RED BLOOD CELL COUNT: 2.4 10*12/L — ABNORMAL LOW (ref 4.50–5.90)
RED BLOOD CELL COUNT: 2.38 10*12/L — ABNORMAL LOW (ref 4.50–5.90)
RED CELL DISTRIBUTION WIDTH: 17.7 % — ABNORMAL HIGH (ref 12.0–15.0)
RED CELL DISTRIBUTION WIDTH: 18.3 % — ABNORMAL HIGH (ref 12.0–15.0)
WBC ADJUSTED: 3 10*9/L — ABNORMAL LOW (ref 4.5–11.0)
WBC ADJUSTED: 2.5 10*9/L — ABNORMAL LOW (ref 4.5–11.0)

## 2018-04-24 LAB — COMPREHENSIVE METABOLIC PANEL
ALBUMIN: 2.5 g/dL — ABNORMAL LOW (ref 3.5–5.0)
ALKALINE PHOSPHATASE: 213 U/L — ABNORMAL HIGH (ref 38–126)
ALT (SGPT): 39 U/L (ref 19–72)
ANION GAP: 11 mmol/L (ref 9–15)
AST (SGOT): 79 U/L — ABNORMAL HIGH (ref 19–55)
BILIRUBIN TOTAL: 1 mg/dL (ref 0.0–1.2)
BLOOD UREA NITROGEN: 17 mg/dL (ref 7–21)
CALCIUM: 7.6 mg/dL — ABNORMAL LOW (ref 8.5–10.2)
CHLORIDE: 100 mmol/L (ref 98–107)
CO2: 26 mmol/L (ref 22.0–30.0)
EGFR MDRD AF AMER: >=60 mL/min/{1.73_m2} (ref >=60)
EGFR MDRD NON AF AMER: >=60 mL/min/{1.73_m2} (ref >=60)
GLUCOSE RANDOM: 97 mg/dL (ref 65–179)
POTASSIUM: 4.4 mmol/L (ref 3.5–5.0)
PROTEIN TOTAL: 5 g/dL — ABNORMAL LOW (ref 6.5–8.3)
SODIUM: 137 mmol/L (ref 135–145)

## 2018-04-24 LAB — ANISOCYTOSIS

## 2018-04-24 LAB — LACTATE DEHYDROGENASE: Lactate dehydrogenase:CCnc:Pt:Ser/Plas:Qn:: 6831 — ABNORMAL HIGH

## 2018-04-24 LAB — MAGNESIUM: Magnesium:MCnc:Pt:Ser/Plas:Qn:: 2.4 — ABNORMAL HIGH

## 2018-04-24 LAB — APTT
APTT: 30.3 s (ref 27.7–37.7)
Coagulation surface induced:Time:Pt:PPP:Qn:Coag: 30.3

## 2018-04-24 LAB — TROPONIN I: Troponin I.cardiac:MCnc:Pt:Ser/Plas:Qn:: 0.072

## 2018-04-24 LAB — NEUTROPHILS RELATIVE PERCENT: Lab: 64.2

## 2018-04-24 LAB — PHOSPHORUS: Phosphate:MCnc:Pt:Ser/Plas:Qn:: 2.9

## 2018-04-24 LAB — D-DIMER QUANTITATIVE (CH,ML,PD,ET): Lab: 1165 — ABNORMAL HIGH

## 2018-04-24 LAB — INR: Lab: 1.32

## 2018-04-24 LAB — RETICULOCYTE COUNT PCT: Lab: 2.6

## 2018-04-24 LAB — BILIRUBIN DIRECT: Bilirubin.glucuronidated:MCnc:Pt:Ser/Plas:Qn:: 0.4

## 2018-04-24 LAB — HAPTOGLOBIN: Haptoglobin:MCnc:Pt:Ser/Plas:Qn:: 678 — ABNORMAL HIGH

## 2018-04-24 LAB — FIBRINOGEN LEVEL: Lab: >1000 — ABNORMAL HIGH

## 2018-04-24 LAB — BILIRUBIN, DIRECT: BILIRUBIN DIRECT: 0.4 mg/dL (ref 0.00–0.40)

## 2018-04-24 LAB — LACTATE BLOOD VENOUS: Lactate:SCnc:Pt:BldV:Qn:: 0.9

## 2018-04-24 LAB — POTASSIUM: Potassium:SCnc:Pt:Ser/Plas:Qn:: 4.4

## 2018-04-24 NOTE — Unmapped (Signed)
Hi,     Connie contacted the [employer] regarding the following:    Would like to know if they need to take the patient off the Methadone medication?    Please feel free to contact Brent at [phone].    Thanks in advance,      Christell Faith  [facility]   [phone]

## 2018-04-24 NOTE — Unmapped (Signed)
Pt Stage IV Renal with mets to lung and bone. Per friend, pt has started to lose it since being placed on Methadone on 5/7. Pt not following commands and per friend, he has been more confused with simple tasks. Pt grimacing in triage, following commands intermittently, and not answering questions consistently. Pt was seen on Tuesday by his palliative care doctor and cleared. Pt friend is the sole caretaker and reports starting to feel burned out as he has to have most things done for him.

## 2018-04-24 NOTE — Unmapped (Addendum)
Pt followed by Dr Salem Caster, [facility] for met renal cell cancer with mets to lung LN and bone. Pt followed for pain and sx mng by Outpt. Oncology Palliative Care,  Dr Doreatha Martin. Pt has cocaine use/opioid use disorder. See previous notes re managing risk      RN spoke to [place], pt caregiver. She stays with pt throughout day and overnight--she is beginning to experience caregiver burnout and don't know how long I can continue to care for him by myself.     Reported increase in strange behaviors since Wed 5/22 after last Outpt. Oncology Palliative Care appointment with provider Dr Legrand Como on 5/22--smacking, grimacing, groaning, not eating (althoguth tolerating and drinking fluids and did have some pudding this am), up and down overnight, is a partial assist to bathroom--forgot to remove pants before sitting on commode  Yesterday x 1(no reports of defecating or urinating on self), has L> R hand tremors, no reports of aphasia [name] reports pt calling out to someone in home that isn't present, called [name] by wrong name x 1 yesterday, no report of hallucinations. Remains in bed majority of day groaning. [name] reports he hasn't been the same since starting methadone (of note MS Contin stopped on 04/07/18 and methadone started). [name] oversees medication administration to pt states he's taking meds as prescribed methadone 10 mg TID and oxycodone 30 mg Q4.  States she believes there is little possibility that he has used cocaine in the last 2 days as I've been right here the whole time.    RN reviewed meds--there have been no changes in pain meds 04/16/18 when pt was discharged from [facility] on 15mg  methadone TID and oxycodone 30 mg Q4 prn. Was previously switched from MS Contin to methadone 10 mg BID on 04/07/18 and oxycodone mg Q4. Pt current pain regimen ismethadone 10 mg TID and oxycodone 30 mg Q4.    Pt reported chest pain in early morning-EMS called to home at 5 am, cardiac workup negative and VSS.    RN spoke with pt--pt unable to identify date and day or week or if morning, afternoon, or evening. When asked tell me how you are feeling pt groaned and grimaced and was unable to produce coherent speech.     RN informed [name] that message would be sent to Dr Legrand Como for any recommendations at this time but if pt conditions does not improve or worsens to take pt to ED for further evaluation. Caregiver verbalized understanding.    Message sent to Dr Legrand Como for recs.  Recommends pt come to ED for further evaluation of new neurological sx.   Pts primary oncologist Dr Okey Dupre, notified as well.

## 2018-04-24 NOTE — Unmapped (Addendum)
Switched to Methadone on 7th May, 2019. Pt has been AMS since he was switched from morphine to methadone. Family states pt's last cocaine use was last Sunday. Friend is concerned that his cognitive decline is r/t switching to methadone. She also reports caregiver burnout as his family doesn't assist with his care. She also states that pt is unable to do his own ADLs. Pt also had chest pain this morning. EMS performed 12 Lead. friend states that she signed refusal as ems reported normal rhythm.

## 2018-04-25 DIAGNOSIS — G9340 Encephalopathy, unspecified: Principal | ICD-10-CM

## 2018-04-25 LAB — COMPREHENSIVE METABOLIC PANEL
ALBUMIN: 2.4 g/dL — ABNORMAL LOW (ref 3.5–5.0)
ALKALINE PHOSPHATASE: 181 U/L — ABNORMAL HIGH (ref 38–126)
ALT (SGPT): 34 U/L (ref 19–72)
ANION GAP: 7 mmol/L — ABNORMAL LOW (ref 9–15)
AST (SGOT): 83 U/L — ABNORMAL HIGH (ref 19–55)
BILIRUBIN TOTAL: 0.8 mg/dL (ref 0.0–1.2)
BLOOD UREA NITROGEN: 15 mg/dL (ref 7–21)
BUN / CREAT RATIO: 36
CO2: 24 mmol/L (ref 22.0–30.0)
CREATININE: 0.42 mg/dL — ABNORMAL LOW (ref 0.70–1.30)
EGFR MDRD AF AMER: >=60 mL/min/{1.73_m2} (ref >=60)
EGFR MDRD NON AF AMER: >=60 mL/min/{1.73_m2} (ref >=60)
GLUCOSE RANDOM: 94 mg/dL (ref 65–179)
POTASSIUM: 4.2 mmol/L (ref 3.5–5.0)
SODIUM: 134 mmol/L — ABNORMAL LOW (ref 135–145)

## 2018-04-25 LAB — CBC W/ AUTO DIFF
BASOPHILS ABSOLUTE COUNT: 0 10*9/L (ref 0.0–0.1)
BASOPHILS RELATIVE PERCENT: 0.8 %
EOSINOPHILS ABSOLUTE COUNT: 0 10*9/L (ref 0.0–0.4)
EOSINOPHILS RELATIVE PERCENT: 0.9 %
HEMATOCRIT: 23.2 % — ABNORMAL LOW (ref 41.0–53.0)
HEMOGLOBIN: 7.4 g/dL — ABNORMAL LOW (ref 13.5–17.5)
LYMPHOCYTES ABSOLUTE COUNT: 0.7 10*9/L — ABNORMAL LOW (ref 1.5–5.0)
LYMPHOCYTES RELATIVE PERCENT: 21.4 %
MEAN CORPUSCULAR HEMOGLOBIN CONC: 31.9 g/dL (ref 31.0–37.0)
MEAN CORPUSCULAR HEMOGLOBIN: 27.1 pg (ref 26.0–34.0)
MEAN CORPUSCULAR VOLUME: 84.7 fL (ref 80.0–100.0)
MEAN PLATELET VOLUME: 8.7 fL (ref 7.0–10.0)
MONOCYTES ABSOLUTE COUNT: 0.2 10*9/L (ref 0.2–0.8)
MONOCYTES RELATIVE PERCENT: 6.9 %
NEUTROPHILS ABSOLUTE COUNT: 2.1 10*9/L (ref 2.0–7.5)
NEUTROPHILS RELATIVE PERCENT: 66.2 %
PLATELET COUNT: 92 10*9/L — ABNORMAL LOW (ref 150–440)
RED BLOOD CELL COUNT: 2.74 10*12/L — ABNORMAL LOW (ref 4.50–5.90)
RED CELL DISTRIBUTION WIDTH: 17.7 % — ABNORMAL HIGH (ref 12.0–15.0)
WBC ADJUSTED: 3.2 10*9/L — ABNORMAL LOW (ref 4.5–11.0)

## 2018-04-25 LAB — URINALYSIS WITH CULTURE REFLEX
BILIRUBIN UA: NEGATIVE
BLOOD UA: NEGATIVE
GLUCOSE UA: NEGATIVE
KETONES UA: 20 — AB
LEUKOCYTE ESTERASE UA: NEGATIVE
NITRITE UA: NEGATIVE
PH UA: 6.5 (ref 5.0–9.0)
RBC UA: <1 /HPF (ref <=3)
SPECIFIC GRAVITY UA: 1.02 (ref 1.003–1.030)
TRANSITIONAL EPITHELIAL: <1 /HPF (ref 0–2)
UROBILINOGEN UA: >=8 — AB

## 2018-04-25 LAB — BASOPHILS RELATIVE PERCENT: Lab: 0.8

## 2018-04-25 LAB — MAGNESIUM: Magnesium:MCnc:Pt:Ser/Plas:Qn:: 2.2

## 2018-04-25 LAB — TROPONIN I
Troponin I.cardiac:MCnc:Pt:Ser/Plas:Qn:: 0.066
Troponin I.cardiac:MCnc:Pt:Ser/Plas:Qn:: 0.095

## 2018-04-25 LAB — BUN / CREAT RATIO: Urea nitrogen/Creatinine:MRto:Pt:Ser/Plas:Qn:: 36

## 2018-04-25 LAB — RBC UA: Lab: <1

## 2018-04-25 LAB — TOXICOLOGY SCREEN, URINE
AMPHETAMINE SCREEN URINE: <500
BARBITURATE SCREEN URINE: <200
BENZODIAZEPINE SCREEN, URINE: <200

## 2018-04-25 LAB — BENZODIAZEPINE SCREEN, URINE: Lab: <200

## 2018-04-25 NOTE — Unmapped (Addendum)
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  Patient appears uncomfortable in the bed, constantly moving in pain.  Patient is tachycardic to the 110's and SPO2 in the low 90s which per the patient's friend has never been a problem in the past.  We attempted to start the patient on supplemental oxygen by nasal cannula but he refused to comply.  Pulmonary exam significant for bilateral expiratory wheezing consistent with his COPD, will plan to treat with DuoNeb treatment and reassess.  While this could be the single cause of his hypoxia, we will also obtain chest x-ray to rule out pneumonia though he has no other infectious symptoms.  His generalized pain has had no significant progression or acute changes I have low concern for new intra-abdominal pathology or musculoskeletal disease.  No recent travel or PE risk factors, will consider working up further if tachycardia and hypoxia persists despite pain management and breathing treatment.  We will obtain basic labs to rule out any acute changes as well as EKG/troponin to evaluate for causes of tachycardia.    ED Course as of Apr 25 102   Fri Apr 24, 2018   1919 Labs significant for troponin elevated to 0.072. Spoke with cardiology fellow who is not concerned for acute ischemic changes on EKG.  Patient would also be a poor candidate for catheterization.  CBC significant for pancytopenia with new thrombus cytopenia, leukopenia, anemia which is of unclear origin.  No history suggesting active bleeding and last chemotherapy was approximately 2 weeks ago. Elevated troponin may be secondary to demand ischemia from this acute anemia.  We will plan to redraw CBC in case these are incorrect values but will also send a type and screen to prepare for transfusion.  With ongoing tachycardia with new leukopenia, and ANC of 1.6, will plan to draw blood cultures and treat empirically with antibiotics due to possible immunosuppressed status.      2037 Hemoglobin of 6.1 on repeat CBC, will transfuse PRBCs.  Discussed admission with med E resident who recommended DIC work-up, peripheral smear, LP.  Will plan for LP after coags result.      2321 On arrival of med E resident, patient was declining admission however I do not believe he has the capacity at this time to make that decision.  After discussion with patient and his power of attorney, patient was agreeable to stay for treatment.  Will defer LP due to patient's tenuous agreement to be admitted. Patient will be admitted to Med E.        Disposition: Admit    Additional Medical Decision Making     I have reviewed the vital signs and the nursing notes. Labs and radiology results that were available during my care of the patient were independently reviewed by me and considered in my medical decision making. I discussed the case with Dr. Micael Hampshire. I reviewed the patient's prior medical records. I directly visualized and independently interpreted the EKG tracing which showed: Sinus tachycardia at 119 bpm, regular intervals, no acute ischemic changes.  I independently  visualized the radiology images. I discussed the case with the admitting provider.    Portions of this record have been created using Scientist, clinical (histocompatibility and immunogenetics). Dictation errors have been sought, but may not have been identified and corrected.  __________________________________________    History     Chief Complaint  Altered Mental Status    HPI   Russell [name] is a 57 y.o. male with history of metastatic renal cell carcinoma, cocaine abuse, hypertension who presents for evaluation of increased confusion, uncontrollable pain since discharge from the hospital last week.  The majority of the history was obtained from patient's friend with whom the patient lives.  She states that he was discharged last Thursday after being admitted for pain management related to his metastatic cancer.  During the hospitalization he was transitioned from morphine to methadone as his primary method of pain control and he continued to take 30 mg of oxycodone every 4 hours as needed.  The patient states that his current pain is primarily in his hips but also his abdomen, back, bilateral legs.  Patient has been using the oxycodone on average about 4 times per day.  However he states that his pain is never adequately controlled with his medications.  The patient's friend states that since she returned home on Sunday the patient has seemed more confused than previously.  He has been struggling with ADLs that he had previously done independently and provided a specific example of being unable to understand instructions of how to put on his shoes.  She states that she has had to assist him with virtually all activities including using the bathroom and feeding him which has been overwhelming to her and that she feels she is no longer able to take care of him.  She states that there has been no progression of the symptoms over the week but that they have been present consistently for the last 5 days.  She also advises that the patient continues to have extremely poor appetite and has continued to lose weight.  Patient states he has not had a bowel movement in the past few days.    Patient denies any fever, chills, nausea, vomiting, chest pain, dyspnea, changes in the nature of his pain since discharge.  He states that he last used cocaine 5 days ago.  Denies any other drug use.    Past Medical History:   Diagnosis Date   ??? Bone metastasis (CMS-HCC)    ??? Renal cancer (CMS-HCC)        Patient Active Problem List   Diagnosis   ??? Metastatic renal cell carcinoma to bone (CMS-HCC)   ??? Substance abuse (CMS-HCC)   ??? Cancer related pain   ??? Leukoplakia of oral cavity   ??? Essential hypertension     Past Surgical History:   Procedure Laterality Date   ??? FLEXIBLE BRONCHOSCOPY  07/24/2017   ??? HERNIA REPAIR       No current facility-administered medications for this encounter.     Current Outpatient Medications:   ???  acetaminophen (TYLENOL) 500 MG tablet, Take 2 tablets (1,000 mg total) by mouth three (3) times a day (at 6am, noon and 6pm)., Disp: 30 tablet, Rfl: 0  ???  albuterol (PROVENTIL HFA;VENTOLIN HFA) 90 mcg/actuation inhaler, Inhale 2 puffs., Disp: , Rfl:   ???  amLODIPine (NORVASC) 5 MG tablet, Take 1 tablet (5 mg total) by mouth daily., Disp: 90 tablet, Rfl: 5  ???  dexamethasone (DECADRON) 0.5 mg/5 mL solution, Take 10  mL (1 mg total) by mouth Four (4) times a day. Swish and spit., Disp: 500 mL, Rfl: 12  ???  everolimus, antineoplastic, (AFINITOR) 5 mg tablet, Take 1 tablet (5 mg total) by mouth daily. (Patient not taking: Reported on 04/22/2018), Disp: 30 tablet, Rfl: 5  ???  gabapentin (NEURONTIN) 300 MG capsule, Take 3 capsules (900 mg total) by mouth Three (3) times a day., Disp: 270 capsule, Rfl: 0  ???  ibuprofen (ADVIL,MOTRIN) 600 MG tablet, Take 1 tablet (600 mg total) by mouth every eight (8) hours as needed for pain., Disp: 60 tablet, Rfl: 2  ???  lenvatinib 18 mg/day (10 mg x 1-4 mg x2) cap, Take 18 mg by mouth daily. (Patient not taking: Reported on 04/22/2018), Disp: 90 capsule, Rfl: 5  ???  lidocaine (LIDODERM) 5 % patch, Place 1 patch on the skin daily. Apply to affected area for 12 hours only each day (then remove patch), Disp: 90 patch, Rfl: 0  ???  lidocaine-diphenhydramine-aluminum-magnesium (FIRST-MOUTHWASH BLM) 200-25-400-40 mg/30 mL Mwsh, SWISH, GARGLE & SPIT BY MOUTH 1 TEASPOONFUL EVERY 6 HOURS AS NEEDED. MAY BE SWALLOWED IF ESOPHAGEAL INVOLVEMENT, Disp: , Rfl: 0  ???  methadone (DOLOPHINE) 10 MG tablet, Take 1 tablet (10 mg total) by mouth every eight (8) hours. Fill on or after 04/23/18, Disp: 21 tablet, Rfl: 0  ???  naloxone (NARCAN) 4 mg nasal spray, One spray in either nostril once for known/suspected opioid overdose. May repeat every 2-3 minutes in alternating nostril til EMS arrives, Disp: 1 each, Rfl: 0  ???  nicotine (NICODERM CQ) 14 mg/24 hr patch, Place 1 patch on the skin daily. (Patient not taking: Reported on 04/21/2018), Disp: 28 patch, Rfl: 0  ???  nicotine polacrilex (NICORETTE) 2 mg gum, Apply 1 each (2 mg total) to cheek every hour as needed for smoking cessation. (Patient not taking: Reported on 04/21/2018), Disp: 110 each, Rfl: 0  ???  omeprazole (PRILOSEC) 20 MG capsule, Take 20 mg by mouth daily. TAKE 1 CAPSULE (20 MG TOTAL) BY MOUTH DAILY., Disp: , Rfl: 6  ???  ondansetron (ZOFRAN-ODT) 4 MG disintegrating tablet, Take 2 tablets (8 mg total) by mouth every eight (8) hours as needed for nausea., Disp: 60 tablet, Rfl: 3  ???  oxyCODONE (ROXICODONE) 30 MG immediate release tablet, Take 1 tablet (30 mg total) by mouth every four (4) hours as needed for pain. Fill on or after 04/23/18, Disp: 42 tablet, Rfl: 0  ???  predniSONE (DELTASONE) 5 MG tablet, 30 mg 5/17, 15 mg x3 days, 10 mg x3 days, 5mg  x3 days, (Patient not taking: Reported on 04/22/2018), Disp: 24 tablet, Rfl: 0  ???  tiotropium (SPIRIVA WITH HANDIHALER) 18 mcg inhalation capsule, Place 18 mcg into inhaler and inhale., Disp: , Rfl:   ???  urea (CARMOL) 40 % cream, Apply 1 application topically Two (2) times a day. To hands and feet., Disp: 198 g, Rfl: 6    Allergies  Patient has no known allergies.    Family History   Problem Relation Age of Onset   ??? Brain cancer Paternal Uncle        Social History  Social History     Tobacco Use   ??? Smoking status: Current Every Day Smoker     Packs/day: 1.00     Types: Cigarettes   ??? Smokeless tobacco: Never Used   Substance Use Topics   ??? Alcohol use: Yes   ??? Drug use: Yes     Types: Cocaine  Review of Systems  All other systems have been reviewed and are negative except as otherwise documented.     Constitutional: Negative for fever.  Eyes: Negative for visual changes.  ENT: Negative for sore throat.  Cardiovascular: Negative for chest pain.  Respiratory: Negative for shortness of breath.  Gastrointestinal: Decreased appetite and frequency of bowel movements.  No nausea/vomiting.  Genitourinary: Negative for dysuria.  Musculoskeletal: Generalized lower back pain, bilateral hip pain, bilateral leg pain.  Skin: Negative for rash.  Neurological: Negative for headaches, focal weakness or numbness.    Physical Exam     ED Triage Vitals   Enc Vitals Group      BP 04/24/18 1626 109/73      Heart Rate 04/24/18 1626 116      SpO2 Pulse 04/24/18 1654 126      Resp 04/24/18 1626 14      Temp 04/24/18 1626 37.5 ??C (99.5 ??F)      Temp Source 04/24/18 1626 Oral      SpO2 04/24/18 1626 98 %     Constitutional: Alert and oriented.  Constantly moving around on bed in apparent discomfort.  Eyes: Conjunctivae are normal.  ENT       Head: Normocephalic and atraumatic.       Nose: No congestion.       Mouth/Throat: Mucous membranes are moist.       Neck: No stridor.  Hematological/Lymphatic/Immunilogical: No cervical lymphadenopathy.  Cardiovascular: Tachycardic rate, regular rhythm. Normal and symmetric distal pulses are present in all extremities.  Respiratory: Normal respiratory effort.  Bilateral expiratory wheezing.  Gastrointestinal: Mild general abdominal pain to palpation without guarding or rebound tenderness.   Musculoskeletal: Normal range of motion in all extremities.  Has generalized pain to palpation over hips and thighs.  Neurologic: Normal speech and language. No gross focal neurologic deficits are appreciated.  Skin: Skin is warm, dry and intact. No rash noted.  Psychiatric: Mood and affect are normal. Speech and behavior are normal.         Tonna Boehringer, MD  Resident  04/25/18 1610       Tonna Boehringer, MD  Resident 04/25/18 [phone]

## 2018-04-25 NOTE — Unmapped (Signed)
Medicine History and Physical    Assessment/Plan:    Principal Problem:    Encephalopathy  Active Problems:    Metastatic renal cell carcinoma to bone (CMS-HCC)    Substance abuse (CMS-HCC)    Cancer related pain    Pancytopenia (CMS-HCC)    Hypoxemia  Resolved Problems:    * No resolved hospital problems. *      Russell [name] is a 57 y.o. male with history of polysubstance abuse, COPD and metastatic RCC no longer on chemotherapy, with poorly-controlled malignant pain requiring recent hospitalization for pain management and use of moderate-high doses of corticosteroids who presented to [facility] with encephalopathy and was found to be hypoxemic with new pancytopenia.    Encephalopathy: Although his encephalopathy on examination is not profound, this was his primary reason for presentation and he is clearly not at his cognitive baseline.  In this situation, differential is broad but probably most concerning for infection (either primary CNS infection or peripheral/systemic infection) or a drug/withdrawal effect.  With respect to infection, his immunocompromise due to significant steroid use, pancytopenia and recent chemotherapy should not be overlooked.  She also be noted that while he has not febrile here, his temperature was 37.5 in the setting of scheduled and PRN antipyretics.  Although, he is on a number of pain???related medications that are frequent culprits for encephalopathy, his pain regimen is relatively unchanged over recent months and he is just now experiencing encephalopathy.  Given his history, substance use and/or withdrawal are a wild card.  PRES, as a result of a drug effect (cocaine or cabozantinib) could cause a similar presentation.  - Follow-up blood cultures  - Urinalysis +/- culture  - Urine toxicology screen  - Brain MRI with contrast  - Vancomycin, pharmacy to dose  - Cefepime 2g Q8h  - Ampicillin 2g Q4h  - Acyclovir 660mg  Q8h  - Consult Med M for LP in the morning  ??  Hypoxemia: Infection and progressive metastatic disease (+/- lymphangitic spread) are the most likely etiologies for his hypoxemia. Infectious possibilities are quite broad given his immunocompromise due to pancytopenia, moderate-high dose prednisone use and recent chemotherapy. PE should also be considered, as he is a patient with active, metastatic RCC who is presenting with tachycardia and new hemoptysis.  - CTA chest  - Lower respiratory culture  - Follow-up blood cultures  - Pneumocystis DFA  - Fungitell assay  - PRN nebulized albuterol  - PRN nebulized ipratropium  ??  Pancytopenia: Surprising drop in all of his cell counts from the week prior despite absence of recent chemotherapy. Most concerning is the possibility of a consumptive process such as DIC in the setting of above-mentioned suspected infection. An infiltrative process could also be responsible for the cytopenias - this could come in the form of disease progression (and he has known bone involvement already) or an infiltrating infection (such as nocardia, TB or disseminated mycoses), which could perhaps tie together his pulmonary, hematologic and neurologic abnormalities. Alternatively he could have marrow suppression related to systemic infection or a drug effect.  - Peripheral blood smear  - Fibrinogen, INR, PTT  - LDH, haptoglobin, conjugated bilirubin  - Reticulocyte count  - Daily CBC with diff  ??  Metastatic RCC: Complicated by diffuse metastases with resultant, difficult-to-control pain. Although there is concern about encephalopathy, he still appears to be in considerable pain on exam and he is not somnolent or bradypneic so will continue home analgesic regimen for now. He has progressed on first  and second line chemotherapy, so initiation of everolimus and lenvatinib was discussed in clinic though current presentation is concerning for potential accelerated progression of disease in the interim.  - Palliative care consult  - Methadone 15mg  TID  - Oxycodone 30mg  Q4h PRN   - Acetaminophen 1000mg  TID  - Ibuprofen 600mg  Q6h PRN  - 5% lidocaine patch  - Pantoprazole 20mg  daily  ??  Elevated troponin: Likely represents demand mismatch in the setting of severe anemia and tachycardia. This could also be consistent with PE with right heart strain. Low suspicion for ACS with low level troponin in the absence of chest pain or ECG changes.  - Trend troponin  - CTA per above  - Serial ECG for symptoms  ??  Hypertension: Low-normal blood pressures and potential for hypotension with a number of the above-mentioned differential diagnoses.  - Hold amlodipine  ??  Tobacco use:  - Nicotine patch 14mg /24hr  - Nicotine gum PRN    FEN/GI/PPx:  Diet - regular  IVF - none  Replete electrolytes PRN  GI - pantroprazole 20mg  daily  VTE - enoxaparin 40mg  daily    Code status: Full - no living will or ACP note per Dr. Caprice Red note on 5/22  Decision makers: [name] and [name]  Dispo: oncology service, floor status  Access: PIV x 2  ___________________________________________________________________    Chief Complaint:  Cognitive changes    HPI:  Russell [name] is a 57 y.o. male with history of metastatic RCC, polysubstance abuse and COPD who presented to [facility] with encephalopathy.    Patient presenting today due to encephalopathy. Patient's friend/roommate [name] reports that he has needed help doing basic tasks such as putting on his shoes or using the bathroom which is a significant change for him, and he seems to take a long time to process things or respond to questions. Progressive during this time. No other neurologic symptoms. No headache or neck pain/stiffness. Some cough, occasionally blood tinged but minimal dyspnea. Friend has heard him wheezing at times, but not much different from baseline. No fever/chills or night sweats. He has chronic abdominal pain but relatively unchanged, and no nausea/vomiting or diarrhea. Pain worsening but no new myalgias or arthralgias. No rash or skin changes. No sick contacts or particular exposures (specifically denies any tick or other insect bites). Continues to smoke but cigarettes per day have been dwindling for months. Last used cocaine five days ago, and denies other illicit drug use.   ??  Admitted 5/8-5/16 on a hospitalist service for uncontrolled cancer-related pain. He was discharged on methadone 15mg  TID, gabapentin 900mg  TID, oxycodone 30mg  Q4h PRN, ibuprofen 600mg  TID PRN, acetaminophen 1000mg  TID and lidocaine patches all for pain. He and [name] (who controls his medications) report that he has taken all of his medications as prescribed. He was also sent home with a prednisone taper of 30-15-10-5mg  x 3d each (though 5/22 oncology clinic note says he was taking 20mg  daily), which comes on the heels of 60mg  daily 5/8-5/14 and 30mg  daily 5/15-5/16 as an inpatient and (per notes from last hospitalization) ~40mg  prednisone daily for a week prior to that admission.    Allergies:  Patient has no known allergies.    Medications:   Prior to Admission medications    Medication Dose, Route, Frequency   acetaminophen (TYLENOL) 500 MG tablet 1,000 mg, Oral, 3 times a day   albuterol (PROVENTIL HFA;VENTOLIN HFA) 90 mcg/actuation inhaler 2 puffs, Inhalation   amLODIPine (NORVASC) 5 MG tablet  5 mg, Oral, Daily (standard)   dexamethasone (DECADRON) 0.5 mg/5 mL solution 1 mg, Oral, 4 times a day, Swish and spit.   everolimus, antineoplastic, (AFINITOR) 5 mg tablet 5 mg, Oral, Daily (standard)  Patient not taking: Reported on 04/22/2018   gabapentin (NEURONTIN) 300 MG capsule 900 mg, Oral, 3 times a day (standard)   ibuprofen (ADVIL,MOTRIN) 600 MG tablet 600 mg, Oral, Every 8 hours PRN   lenvatinib 18 mg/day (10 mg x 1-4 mg x2) cap 18 mg, Oral, Daily (standard)  Patient not taking: Reported on 04/22/2018   lidocaine (LIDODERM) 5 % patch 1 patch, Transdermal, Every 24 hours, Apply to affected area for 12 hours only each day (then remove patch) lidocaine-diphenhydramine-aluminum-magnesium (FIRST-MOUTHWASH BLM) 200-25-400-40 mg/30 mL Mwsh SWISH, GARGLE & SPIT BY MOUTH 1 TEASPOONFUL EVERY 6 HOURS AS NEEDED. MAY BE SWALLOWED IF ESOPHAGEAL INVOLVEMENT   methadone (DOLOPHINE) 10 MG tablet 10 mg, Oral, Every 8 hours, Fill on or after 04/23/18   naloxone (NARCAN) 4 mg nasal spray One spray in either nostril once for known/suspected opioid overdose. May repeat every 2-3 minutes in alternating nostril til EMS arrives   nicotine (NICODERM CQ) 14 mg/24 hr patch 1 patch, Transdermal, Daily (standard)  Patient not taking: Reported on 04/21/2018   nicotine polacrilex (NICORETTE) 2 mg gum 2 mg, Buccal, Every 1 hour prn  Patient not taking: Reported on 04/21/2018   omeprazole (PRILOSEC) 20 MG capsule 20 mg, Oral, Daily (standard), TAKE 1 CAPSULE (20 MG TOTAL) BY MOUTH DAILY.   ondansetron (ZOFRAN-ODT) 4 MG disintegrating tablet 8 mg, Oral, Every 8 hours PRN   oxyCODONE (ROXICODONE) 30 MG immediate release tablet 30 mg, Oral, Every 4 hours PRN, Fill on or after 04/23/18   predniSONE (DELTASONE) 5 MG tablet 30 mg 5/17, 15 mg x3 days, 10 mg x3 days, 5mg  x3 days,  Patient not taking: Reported on 04/22/2018   tiotropium (SPIRIVA WITH HANDIHALER) 18 mcg inhalation capsule 18 mcg, Inhalation   urea (CARMOL) 40 % cream 1 application, Topical, 2 times a day (standard), To hands and feet.       Medical History:  Past Medical History:   Diagnosis Date   ??? Bone metastasis (CMS-HCC)    ??? Renal cancer (CMS-HCC)        Surgical History:  Past Surgical History:   Procedure Laterality Date   ??? FLEXIBLE BRONCHOSCOPY  07/24/2017   ??? HERNIA REPAIR         Social History:  Social History     Socioeconomic History   ??? Marital status: Single     Spouse name: Not on file   ??? Number of children: Not on file   ??? Years of education: Not on file   ??? Highest education level: Not on file   Occupational History   ??? Not on file   Social Needs   ??? Financial resource strain: Not on file   ??? Food insecurity: Worry: Not on file     Inability: Not on file   ??? Transportation needs:     Medical: Not on file     Non-medical: Not on file   Tobacco Use   ??? Smoking status: Current Every Day Smoker     Packs/day: 1.00     Types: Cigarettes   ??? Smokeless tobacco: Never Used   Substance and Sexual Activity   ??? Alcohol use: Yes   ??? Drug use: Yes     Types: Cocaine   ??? Sexual activity: Not on file  Lifestyle   ??? Physical activity:     Days per week: Not on file     Minutes per session: Not on file   ??? Stress: Not on file   Relationships   ??? Social connections:     Talks on phone: Not on file     Gets together: Not on file     Attends religious service: Not on file     Active member of club or organization: Not on file     Attends meetings of clubs or organizations: Not on file     Relationship status: Not on file   Other Topics Concern   ??? Not on file   Social History Narrative   ??? Not on file       Family History:  Family History   Problem Relation Age of Onset   ??? Brain cancer Paternal Uncle        Review of Systems:  10 systems reviewed and are negative unless otherwise mentioned in HPI    Labs/Studies:  Labs and Studies from the last 24hrs per EMR and Reviewed    Physical Exam:  Temp:  [37.4 ??C-37.5 ??C] 37.4 ??C  Heart Rate:  [105-117] 105  SpO2 Pulse:  [106-126] 106  Resp:  [14-25] 20  BP: (109-165)/(69-88) 165/76  SpO2:  [89 %-98 %] 93 %    GEN - Appears very uncomfortable, frequently shifting around in bed. Wincing at times. Cursing to himself. Interactive but frequently slow to respond or unresponsive to questions altogether. Friend at bedside providing majority of history.  HEENT - No conjunctival icterus, injection or exudate. No rhinorrhea. Partial dentures in place. MM somewhat dry. No appreciable adenopathy.  CV - Tachycardic, regular. Faint systolic murmur. Unable to visualize JVPs.  PULM - Mildly tachypnic. Wearing 2L/min nasal cannula and satting in mid 90s, but repeatedly removing it and dropping to 88-90% on room air. One episode of small volume hemoptysis with blood mixed with mucous. Lateral crackles but air movement was adequate, no wheezing. Of note, posterior lung fields not examined secondary to patient pain/unwillingness.  ABD - Soft, mildly distended. Active bowel sounds. Non-tender when distracted. Unable to examine CVAs.  EXT - Warm. Trace edema bilaterally. Tender with examination for pitting.  NEURO - Alert. Oriented to self, place, birth date, age and year but not month or date. Able to spell world but unable to attempt spelling it backwards. Anisocoria (L>R) but reactive to light. CN otherwise intact bilaterally. UE strength intact. Distal LE strength intact. Unable to test proximal LE strength secondary to hip/thigh pain. Spontaneous low-amplitude unilateral muscle jerks. No appreciable asterixis or inducible myoclonus. Patient not cooperative with cerebellar testing.  PSYCH - Somewhat labile. Not linear or logical. Poor insight and judgement.  SKIN - Warm and dry. Elbow extensor surfaces dry and shiny with mild healing scabs. Evidence of possible old excoriations on legs. No purpura or other rash.  MSK - Left third finger DIP deformity, otherwise no apparent gross joint swelling or deformity. Expressed pain with knee palpation, but inconsistently so and was freely bending/flexing his knees (which were not warm or erythematous) during encounter.

## 2018-04-25 NOTE — Unmapped (Signed)
Patient rounds completed. The following patient needs were addressed:  Pain, Toileting, Personal Belongings, Plan of Care, Call Bell in Reach and Bed Position Low .

## 2018-04-25 NOTE — Unmapped (Signed)
[facility]  Emergency Department Progress Note    Apr 24, 2018 6:03 PM    I supervised care provided by the resident/APP. We have discussed the case, I have reviewed the note and I agree with the plan of treatment.    Recent VS:  Recent Patient Data       04/24/2018 1626 04/24/2018 1654 04/24/2018 1700       BP:  109/73  117/82  ???     Pulse:  116  117  112     Temp:  37.5 ??C (99.5 ??F)  ???  ???     Resp:  14  22  19      SpO2:  98 %  89 %  (Abnormal)   98 %         Russell [name] is a 57 y.o. male with a past medical history of diffusely metastatic RCC, HTN and polysubstance abuse presenting for AMS. Patient's friend states that in the last week she noticed a decline in the patient's weight, physical activity, and mental status. Patient has been more confused with day- day activities. Patient was recently hospitalized between 5-8-5/16 for pain control in the setting of his metastatic RCC. Patient was switched to Methadone and oxycodone during this admission for pain control.     6:54 PM   EKG shows tachycardia with ST depressions in 2 and 3 as well as V5 and V6, as well as PVCs. These are all new from his last EKG on 5/9 except for the ST depression in V6 which was present on prior EKG.    On re-evaluation, patient states he had chest pain earlier today while walking to the bathroom but denies any current chest pain.  Patient is oriented to person and place but not time. He is pale appearing with dry mucous membranes. Patient has TTP to the left knee and is intermittently groining in pain from his thighs and hips. Abdomen is soft, non-tender, and non-distended. Labs show patient is anemic with hgb to 6.5 and RBC to 2.4. Troponin is elevated to 0.072. This could be ACS but more likely demand in the setting of anemia.Waiting on chest XR, CT head, sepsis workup and will order a second troponin. This case was discussed with onc who recommended transfusion in addition to the sepsis workup.     Plan to transfuse PRBCs, send a DIC work-up, and empirically treat with antibiotics.  Patient admitted to med E.  ??   Additional Medical Decision Making   ??  I have reviewed the patient's vital signs and the nursing notes. Any pertinent labs & imaging results which were available during my care of the patient were reviewed by me.     ??   ED Clinical Impression     Final diagnoses:   None     _________________________________  Documentation assistance was provided by Devin Going, Scribe, on Apr 24, 2018 at 6:49 PM for Dewitt Hoes, MD.    Documentation assistance was provided by the scribe in my presence.  The documentation recorded by the scribe has been reviewed by me and accurately reflects the services I personally performed.

## 2018-04-25 NOTE — Unmapped (Signed)
pT IS REFUSING TO PUT ON OXYGEN.

## 2018-04-25 NOTE — Unmapped (Signed)
Pt admitted from ED at approximately 0030, 1 unit of pRBC finished infusing upon arrival. Pt moaning, oriented to self and situation,only minimially participating in admission, information obtained from friend and mother.  PRN oxycodone given 1x, scheduled methodone given 1x, lidocaine patch cut in half and applied to L hip and R shoulder. Inspiratory wheezes, PRN duoneb given x1.  On 2L [place], pt requiring frequent reminders to keep O2 on.  WCTM.

## 2018-04-25 NOTE — Unmapped (Signed)
Vancomycin Initiation Pharmacy Note    Russell [name] is a 57 y.o. male being initiated on vancomycin therapy for Suspected infection .    Goal trough: 15-20 mg/L    Pharmacokinetic Parameters:    Wt Readings from Last 1 Encounters:   04/22/18 87 kg (191 lb 14.4 oz)     Lab Results   Component Value Date    CREATININE 0.56 (L) 04/24/2018      Vd = 61.77 L, ke = 0.104 hr-1    Recommended Dose: 1250 mg IV q8h  CrCl capped at 120 ml/min    Estimated Peak: 34 mg/L  Estimated trough: 16 mg/L    Pharmacy will continue to monitor and order levels as appropriate.  Patient will be followed for changes in renal function, toxicity, and efficacy.  Please page service pharmacist with questions/clarifications.    [place] K Mindel Friscia,  PharmD

## 2018-04-25 NOTE — Unmapped (Addendum)
Patient rounds completed. The following patient needs were addressed:  Pain, Toileting, Personal Belongings, Plan of Care, Call Bell in Reach and Bed Position Low .

## 2018-04-25 NOTE — Unmapped (Signed)
Medicine Daily Progress Note    Assessment/Plan:  Principal Problem:    Encephalopathy  Active Problems:    Metastatic renal cell carcinoma to bone (CMS-HCC)    Substance abuse (CMS-HCC)    Cancer related pain    Pancytopenia (CMS-HCC)    Hypoxemia  Resolved Problems:    * No resolved hospital problems. *           Russell [name] is a 57 y.o. male with history of polysubstance abuse, COPD and metastatic RCC no longer on chemotherapy, with poorly-controlled malignant pain requiring recent hospitalization for pain management and use of moderate-high doses of corticosteroids who presented to [facility] with AMS (UTox + cocaine) and was found to be hypoxemic with new pancytopenia.  ??  AMS, resolved: Time course, resolution, and UTox consistent with polysubstance abuse bender over past several days or so as etiology (roommate left town 5/18 and not back till yesterday 5/24 when she found the patient acutely altered, see Dr. London Sheer 5/24 H&P for more details). Patient alert and oriented early this morning (5/25) -- A/O to self, place, month, year, president, and name and phone number of mother and two close friends. Not A/O to day of month (25th), thought he had only just gotten out of the hospital a couple of days ago (discharged the 16th of this month) -- this would also be in accordance with polysubstance abuse bender of several days duration while roommate, [name], was out of town. Patient with known history of polysubstance abuse, UTox + for cocaine (though patient denies it, + for cocaine prior admission earlier this month as well) as well as methadone and opiates (expected from methadone and oxycodone Rx, though difficult to determine if patient was taking a higher quantity than prescribed).  Of note, patient's alertness from the early morning diminished slightly after 30mg  oxycodone, though nowhere near presentation.  - Pain mgmt:   - Palliative care consult, appreciate assistance   - Methadone 15mg  TID, continue for now   -??D/c oxycodone 30mg  Q4h PRN, start dilaudid 8mg  PO q4h PRN   - Acetaminophen 1000mg  TID   - Ibuprofen 600mg  Q6h PRN   - Ketorolac 15mg  IV one time dose   -??5% lidocaine patch  - Appreciate radiation oncology consult for palliative treatment of L proximal femur (hip), foremost location of patient's pain. It is their assessment that given systemic extensive burden of disease, they would defer local treatment with XRT at this time.  Of note, when I went to speak with patient afterwards, he did not want palliative XRT as he would prefer to stay the pain pill route.  - Consider psychiatry consult Tuesday 5/28 given Memorial Day weekend  ??  Hypoxemia:??On 2L [place], not on oxygen at home.Infection and progressive metastatic disease (+/- lymphangitic spread) are the most likely etiologies for his hypoxemia. Infectious possibilities are quite broad given his immunocompromise due to pancytopenia, moderate-high dose prednisone use and recent chemotherapy. PE should also be considered, as he is a patient with active, metastatic RCC who is presenting with tachycardia and new hemoptysis.    -??CTA chest  - Lower respiratory culture  - Follow-up blood cultures  - Pneumocystis DFA  - Fungitell assay  - PRN nebulized albuterol  - PRN nebulized ipratropium  ??  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 Drop in all of his cell counts from the week prior despite absence of recent chemotherapy.  An infiltrative process could also be responsible for the cytopenias - this could come in the form of  disease progression (and he has known bone involvement already).  Alternatively he could have marrow suppression related to systemic infection or a drug effect.  Particularly of note is levamisole is common contaminant in cocaine and can cause agranulocytosis. DIC labs and hemolysis labs reassuring.  - Peripheral blood smear  - Daily CBC with diff  ??  Metastatic RCC:??Complicated by diffuse metastases with resultant, difficult-to-control pain. Although there is concern about encephalopathy, he still appears to be in considerable pain on exam and he is not somnolent or bradypneic so will continue home analgesic regimen for now. He has progressed on first and second line chemotherapy, so initiation of everolimus and lenvatinib was discussed in clinic though current presentation is concerning for potential accelerated progression of disease in the interim.  Patient endorses not yet receiving everolimus and lenvatinib in the mail.    Troponinemia, downtrending: Troponin I peaked at 0.095 on 5/25 at 05:25am, since downtrending. Patient without chest pain or ECG changes. Likely represents demand mismatch in the setting of severe anemia and tachycardia. This could also be consistent with PE with right heart strain. Low suspicion for ACS with low level troponin in the absence of chest pain or ECG changes.  - CTA per above  - Serial ECG for symptoms PRN  ??  Hypertension: Low-normal blood pressures on presentation, now robustly 140s-160s.  - Resume home amlodipine  ??  Tobacco use:  - Nicotine patch 14mg /24hr  - Nicotine gum PRN  ??  FEN/GI/PPx:  Diet - regular  IVF - none  Replete electrolytes PRN  GI - pantroprazole 20mg  daily  VTE - enoxaparin 40mg  daily  ??  Code status: Full - no living will or ACP note per Dr. Caprice Red note on 5/22  Decision makers: [name] and [name]    ___________________________________________________________________    Subjective:  Early this morning, patient's mental status significantly improved from overnight and patient is fully alert and oriented to self, place, month, year, president, name and phone numbers of people close to him early this morning.  He did not know the day of the month (25th) and thought it was only a couple of days since his discharge on 5/16, further supporting evidence of leading hypothesis that patient was on polysubstance binge over past week or so -- UTox positive for cocaine, methadone and opiates ( friend and roommate [name] says she heard he got a hold of suboxone in addition to his oxycodone, but she has not been home since weekend of 5/18-19 so she is not sure.  [name] endorses that he frequently asks at home if it is time for more oxycodone 1hr after taking a PRN pill.    Of note, patient's mental status deteriorated slightly later this morning 30 minutes s/p 30mg  oxycodone PRN dose (less oriented to month, etc), but nowhere near what it has been on presentation.    Labs/Studies:  Labs and Studies from the last 24hrs per EMR and Reviewed      Objective:  Temp:  [36.9 ??C-37.6 ??C] 36.9 ??C  Heart Rate:  [90-117] 90  SpO2 Pulse:  [106-126] 106  Resp:  [14-25] 18  BP: (109-165)/(69-94) 143/94  SpO2:  [89 %-98 %] 97 %    GEN: NAD, lying in bed  EYES: EOMI  ENT: MMM  CV: Tachycardic, regular rhythm, no r/m/g appreciated  PULM: Inspiratory ronchi over bilateral lower and middle lung fields  ABD: soft, NT/ND, +BS  EXT: No edema  NEURO: alert and oriented to self, place, month,  year, president, and name and phone number of mother and two close friends. Not A/O to day of month (25th), thought he had only just gotten out of the hospital a couple of days ago (discharged the 16th of this month)

## 2018-04-25 NOTE — Unmapped (Signed)
Report given to [place] RN. Patient care transferred at this time.

## 2018-04-25 NOTE — Unmapped (Signed)
Pt friend at bedside.  

## 2018-04-25 NOTE — Unmapped (Addendum)
Russell [name]??is a 57 y.o.??male??with history of polysubstance abuse, COPD and metastatic RCC no longer on chemotherapy, with poorly-controlled malignant pain requiring recent hospitalization for pain management and use of moderate-high doses of corticosteroids who??presented to [facility] with AMS (UTox + cocaine) and was found to be hypoxemic with new pancytopenia, ultimately left against medical advice (AMA) within a day of admission.  ??  AMS, resolved: Time course, resolution, and UTox consistent with polysubstance abuse bender over past several days or so as etiology (roommate left town 5/18 and not back till 5/24 when she found the patient acutely altered, see Dr. London Sheer 5/24 H&P for more details). AMS resolved and patient alert and oriented by early morning of 5/25 -- A/O to self, place, month, year, president, and name and phone number of mother and two close friends. Not A/O to day of month (25th), thought he had only just gotten out of the hospital a couple of days ago (discharged the 16th of this month) -- this would also be in accordance with polysubstance abuse bender of several days duration while roommate, [name], was out of town. Patient with known history of polysubstance abuse, UTox + for cocaine (though patient denies it, + for cocaine prior admission earlier this month as well) as well as methadone and opiates (expected from methadone and oxycodone Rx, though difficult to determine if patient was taking a higher quantity than prescribed and/or other opiates). Patient left AMA afternoon of 5/25 despite comprehensive discussion of his medical conditions and our medical advice he remain for further work-up of his pancytopenia. Patient possessed capacity at the time of this decision as his AMS had fully resolved.  - Patient discharged AMA on unchanged pain regimen, no new Rx written  - Radiation oncology consulted on patient for evaluation of palliative XRT of L proximal femur (hip), foremost location of patient's pain. It is their assessment that given systemic extensive burden of disease, they would defer local treatment with XRT at this time.  Of note, when I went to speak with patient afterwards, he did not want palliative XRT as he would prefer to stay the pain pill route.  ??  Hypoxemia, improved:??Initially requiring 2L supplemental O2 via nasal cannula, but saturating 92%+ on room by time of AMA discharge.  CTA Chest with severe interval increase in nodular pulmonary opacities, and diffuse nodular intralobular septal thickening and groundglass opacities, likely worsening metastatic disease, but no PE identified.  ??  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 Drop in all of his cell counts from the week prior despite absence of recent chemotherapy. Suspect infiltrative process from worsening known bone involvement.  Alternatively he could have marrow suppression related to systemic infection or a drug effect (particularly of note is levamisole, a common contaminant in cocaine that can cause agranulocytosis). DIC labs and hemolysis labs reassuring.  - Transfused 1u pRBC on 5/24 for Hgb 6.1, improved to 7.4.  ??  Metastatic RCC, progressing:??Complicated by diffuse metastases with resultant, difficult-to-control pain.??He has progressed on first and second line chemotherapy, so initiation of??everolimus and lenvatinib??was discussed in clinic, though patient endorses not yet receiving everolimus and lenvatinib in the mail.  ??  Troponinemia, downtrending:??Troponin I peaked at 0.095 on 5/25 at 05:25am, since downtrending. Patient without chest pain or ECG changes consistent with ischemia. Likely represents??demand??mismatch in the setting of severe anemia and tachycardia on presentation, in addition to + cocaine on UTox causing potential transient coronary vasospasm.   ??  Hypertension:??Low-normal blood pressures on presentation, robustly 140s-160s by time of  AMA discharge.  - Home amlodipine initially held, resumed prior to [facility] discharge  ??  Tobacco use: Patient provided with nicotine patch??14mg /24hr and nicotine gum PRN while in the hospital.

## 2018-04-25 NOTE — Unmapped (Signed)
Brief Hematology/Oncology Consult Note    Requesting Attending Physician :  Crawford Givens, MD  Service Requesting Consult : Emergency Medicine  Reason for Consult: Pancytopenia    Assessment: Russell [name] is an 57 y.o. male with metastatic RCC who presented to the ED tonight due to failure to thrive making it difficult for his caretaker to safely care for him.    He was previously treated with ipilimumab and nivolumab (07/2017-01/2018) then switched to cabozantinib due to POD. This was discontinued during recent admission and per his caregiver, he has not taken this in over one week. Plan is for him to switch therapy now to lenvatinib+everolimus however per caregiver, he has not yet started this course (and it was just prescribed two days prior).    Tonight upon arrival to the ED [name] was found to be pancytopenic with WBC 2.5, Hgb 6.1, plt 90. He has never been pancytopenic before. Oncology was consulted to discuss whether any of the anticancer agents may have caused the pancytopenia.    WBC was 4.3 on 04/15/18, now 2.5, with an ANC of 1.6.  Hgb was 9.5 on 04/15/18, now 6.1.  Plts were 219 on 04/15/18, now 80.    Cause for [name] very acute onset pancytopenia is not clear. While cabozantinib can cause leukopenia, anemia and thrombocytopenia, the timing of acute decline in all cell lines makes this unlikely given he had been on this medication for months without such an effect on the blood counts and has been off it now for nearly two weeks.    While there are rare cases of autoimmune related cytopenias from immunotherapy, he has been off immunotherapy for several months, also making this unlikely. He has not yet started the everolimus and lenvatinib per ED provider report (per caregiver).    Given this, alternate etiologies of pancytopenia are in the differential, ie drug-induced (but less likely anticancer drug induced), infection or alternate marrow stress or bone marrow infiltration of malignancy. Has known bone involvement. The acuity of the development of this over the past nine days makes marrow involvement with disease less likely although still possible. Consumptive process could also be considered although less likely to involve WBCs as well.    Recommendations:   - Transfuse PRBCs given symptomatic anemia  - Agree with admission to oncology for further care  - May require BMBx if no clear etiology of pancytopenia is identified    Outpatient Oncologist: Dr. Marcie Mowers, MD  HemOnc Fellow

## 2018-04-25 NOTE — Unmapped (Signed)
Radiation Oncology Note  Spoke to Dr. Sandi Raveling regarding [name], who is well known to [place].    Briefly, this is a 57 y.o. male with metastatic RCC, widespread bony disease, with pain at his left hip. See note from 04/09/18 for full history.    When I spoke to him on 04/09/18, he reported his pain at his left hip had resolved at that time and reported instead lower ~L4 pain as well as pain radiating down his left leg to his left foot. He reported he had several months of left hip pain before that. On this admission, he primarily endorses left hip pain.    Reviewing his imaging, he does have subtle left pelvic lesions, but these are quite small and quite stable from previous imaging. His major area of disease is on the treated right side. See image below for lesions:          Reviewing his MRI from 04/09/18, he also has heterogenous signal enhancement throughout the spine and pelvis, likely representing metastatic disease throughout the spine and pelvis that we are less able to visualize on the above CT scan. Given the widespread disease without any focal areas of large burden bony disease that clearly localize to his described pain, it is very hard to localize his source of pain (it could be radiating pain from his thoracic/lumbar spine, it could be what appears to be stable lesions from >6 months on left pelvis, etc).    Given this constellation of factors, we cannot localize any area of bony pain that we would be able to reasonably target with any certainty as the source of most of his pain. It is far more likely that we would treat with no impact of pain, a large treatment field, and a chance of adverse events (increased chance of fracture).    Unfortunately, while his pain is certainly from his disease, localized radiation has more risk than benefit.    Other than optimization of pain management, primary team can explore other systemic therapy options, bisphosphonates, or radiopharmaceuticals (I.e. [id number]) if primary team feels those are clinically indicated and may help.    Lonzo Cloud, MD, [facility]  Resident Physician PGY-3  Department of Radiation Oncology  Pager: [phone]

## 2018-04-25 NOTE — Unmapped (Signed)
Radiation Oncology Consult Note    Patient Name: Russell [name]  Patient Age: 57 y.o.  Encounter Date: 04/24/2018    Referring Physician:   Lillia Carmel, MD  Internal Medicine  .  Primary Care Provider: [facility]        Assessment:   Russell [name] is a 57 y.o. 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 I discussed that his osseous metastatic disease was quite widespread and his pain is likely the result of both bony disease as well as a component of nerve shooting pain from involvement of his nerve roots from widespread spine disease. I discussed that based on his exam and imaging (recent CT, MRI, and X-rays), I was not certain whether any particular area of his bony disease best would correlate with his actual pain.    I discussed that if we were to treat, I would recommend a large area of treatment to his lower sacrum, left hemi-pelvis, and 1/2 of his femur. I discussed that this would be a large area of treatment but would be most likely to be able to treat the area most likely causing his pain.     I did discuss side effects from this, including chance of pain flare (he had significant pain flare before relief when treated to his contralateral side), fatigue, diarrhea, urinary urgency. I discussed long term risk including bone fracture. I discussed that he may experience some pain relief in ~2 weeks after radiation, but I was not sure given the nature of his diffuse disease without a bulky specific area of disease to treat.     Given the balance of risks and benefits, patient does not want to proceed with radiation treatment, which I think is reasonable.    I agree with systemic therapy (everolimus and lenvatinib) to try to get systemic effect. If patient does progress in a single area with bulky disease (such as contralateral left hip disease that is clearly visualized), we could do radiation to that in the future, but I don't think any imaging today will help. Please see my note from earlier today for further discussion.    - I do not recommend radiation therapy at this time    History of Present Illness:    Onc history:  Summer 2018- p/w shoulder pain, found to have large renal mass with mets to lung, LN, bone. Had R lung collapse and rapid POD in lung LNs. Bx of RUL tumor with RCC  07/2017- chest XRT  07/22/17- ipi/nivo --> nivo x5, stopped d/t RT pneumonitis vs. nivo pneumonitis, treated with steroids  01/2018- XRT to new R femur and iliac mets  01/08/18- start cabozantinib, c/b HTN, hand foot syndrome, mouth sores    04/09/18: Admitted for worsening left hip, back, and leg pain. Imaging at that time showed widespread osseous disease without any clear focal areas of disease that correlated that could be targeted for radiation. At that time, we recommended continued medical management instead of radiation.    04/24/18: Admitted for altered mental status. Continued to have polysusbtance abuse (cocaine and prescribed opioids.) He was previously discharged on methadone 15mg  TID, gabapentin 900mg  TID, oxycodone 30mg  Q4h PRN, ibuprofen 600mg  TID PRN, acetaminophen 1000mg  TID and lidocaine patches all for pain    He  reports he was applying lidocaine patch just above his left knee where his pain was located at his distal femur.    Today, he reports most of the pain is located at left hip, about 5/10. He reports last night the pain was also located in hip but worse pain was located at lower back (around L5/Sacrum - this is where he had pain when I examined him on May 9th). Pain does occasionally radiate down his left leg and is sharp/shooting in nature       Prior Radiation Therapy:yes lung and R hip  Pregnancy status: No; male patient    Review of Systems:  A 10 systems was negative except for pertinent positives noted in HPI.     PAST MEDICAL HISTORY:  Past Medical History:   Diagnosis Date   ??? Bone metastasis (CMS-HCC)    ??? Renal cancer (CMS-HCC)        FAMILY HISTORY:  Family History   Problem Relation Age of Onset   ??? Brain cancer Paternal Uncle        SOCIAL HISTORY:   Social History     Socioeconomic History   ??? Marital status: Single     Spouse name: Not on file   ??? Number of children: Not on file   ??? Years of education: Not on file   ??? Highest education level: Not on file   Occupational History   ??? Not on file   Social Needs   ??? Financial resource strain: Not on file   ??? Food insecurity:     Worry: Not on file     Inability: Not on file   ??? Transportation needs:     Medical: Not on file     Non-medical: Not on file   Tobacco Use   ??? Smoking status: Current Every Day Smoker     Packs/day: 1.00     Types: Cigarettes   ??? Smokeless tobacco: Never Used   Substance and Sexual Activity   ??? Alcohol use: Yes   ??? Drug use: Yes     Types: Cocaine   ??? Sexual activity: Not on file   Lifestyle   ??? Physical activity:     Days per week: Not on file     Minutes per session: Not on file   ??? Stress: Not on file   Relationships   ??? Social connections:     Talks on phone: Not on file     Gets together: Not on file     Attends religious service: Not on file     Active member of club or organization: Not on file     Attends meetings of clubs or organizations: Not on file     Relationship status: Not on file   Other Topics Concern   ??? Not on file   Social History Narrative   ??? Not on file       No Known Allergies    Current Medications:  Current Facility-Administered Medications   Medication Dose Route Frequency Provider Last Rate Last Dose   ??? acetaminophen (TYLENOL) tablet 1,000 mg  1,000 mg Oral TID Carrolyn Meiers, MD   1,000 mg at 04/25/18 1300   ??? albuterol 2.5 mg /3 mL (0.083 %) nebulizer solution 2.5 mg  2.5 mg Nebulization Q6H PRN Carrolyn Meiers, MD   2.5 mg at 04/25/18 0203   ??? amLODIPine (NORVASC) tablet 5 mg  5 mg Oral Daily Sandi Raveling, MD       ???  baclofen (LIORESAL) tablet 5 mg  5 mg Oral TID PRN Matthew Folks, MD       ??? dexamethasone (DECADRON) oral solution  1 mg Oral 4x Daily Carrolyn Meiers, MD   1 mg at 04/25/18 1300   ??? enoxaparin (LOVENOX) syringe 40 mg  40 mg Subcutaneous Q24H Carrolyn Meiers, MD   40 mg at 04/25/18 0935   ??? HYDROmorphone (DILAUDID) tablet 8 mg  8 mg Oral Q4H PRN Sandi Raveling, MD       ??? ibuprofen (ADVIL,MOTRIN) tablet 600 mg  600 mg Oral Q6H PRN Carrolyn Meiers, MD       ??? ipratropium (ATROVENT) 0.02 % nebulizer solution 500 mcg  500 mcg Nebulization Q6H PRN Carrolyn Meiers, MD   500 mcg at 04/25/18 0203   ??? lidocaine (LIDODERM) 5 % patch 1 patch  1 patch Transdermal Daily Carrolyn Meiers, MD   1 patch at 04/25/18 0134   ??? lidocaine-diphenhydramine-aluminum-magnesium (FIRST-MOUTHWASH BLM) mouthwash 5 mL  5 mL Mouth TID PRN Carrolyn Meiers, MD       ??? methadone (DOLOPHINE) tablet 15 mg  15 mg Oral Q8H Carrolyn Meiers, MD   15 mg at 04/25/18 0934   ??? nicotine (NICODERM CQ) 14 mg/24 hr patch 1 patch  1 patch Transdermal Daily Carrolyn Meiers, MD   1 patch at 04/25/18 1610   ??? nicotine polacrilex (NICORETTE) gum 2 mg  2 mg Buccal Q2H PRN Matthew Folks, MD       ??? ondansetron (ZOFRAN-ODT) disintegrating tablet 8 mg  8 mg Oral Q8H PRN Carrolyn Meiers, MD       ??? pantoprazole (PROTONIX) EC tablet 20 mg  20 mg Oral Daily Carrolyn Meiers, MD   20 mg at 04/25/18 9604   ??? sodium chloride (NS) 0.9 % infusion   Intravenous Continuous Carrolyn Meiers, MD             Physical Exam:   Vitals:    04/25/18 1157   BP: 143/94   Pulse: 90   Resp: 18   Temp: 36.9 ??C (98.4 ??F)   SpO2: 97%     ECOG: 1  General: Well-developed, no acute distress  Skin: No lesions or rashes  Neuro: Alert and oriented to conversation  HEENT:Normocephalic, moist mucous membranes  Eyes: EOMI  Cardio: Normal rate.  Respiratory: Breathing comfortably on room air  GI: Non-distended   MSK: Tender to touch at left hip, sacrum at back  Psych: Normal mood and affect.  Converses clearly and emotionally appropriate.       RADIOLOGY: Personally reviewed.  See HPI    PATHOLOGY: Personally reviewed. See HPI    Labs:      Lab Results   Component Value Date    WBC 3.2 (L) 04/25/2018    WBC 2.5 (L) 04/24/2018    WBC 3.0 (L) 04/24/2018    WBC 4.3 (L) 04/15/2018    WBC 5.4 04/13/2018    HGB 7.4 (L) 04/25/2018    HGB 6.1 (L) 04/24/2018    HGB 6.5 (L) 04/24/2018    HGB 9.5 (L) 04/15/2018    HGB 9.9 (L) 04/13/2018    HCT 23.2 (L) 04/25/2018    HCT 20.1 (L) 04/24/2018    HCT 20.7 (L) 04/24/2018    HCT 29.1 (L) 04/15/2018    HCT 31.0 (L) 04/13/2018    PLT 92 (L) 04/25/2018    PLT 90 (L) 04/24/2018  PLT 80 (L) 04/24/2018    PLT 219 04/15/2018    PLT 266 04/13/2018    LDH 6,831 (H) 04/24/2018    CREATININE 0.42 (L) 04/25/2018    CREATININE 0.56 (L) 04/24/2018    CREATININE 0.56 (L) 04/16/2018    CREATININE 0.48 (L) 04/15/2018    CREATININE 0.53 (L) 04/13/2018    CREATININE 0.53 (L) 04/13/2018    AST 83 (H) 04/25/2018    AST 79 (H) 04/24/2018    AST 102 (H) 04/16/2018    AST 157 (H) 04/15/2018    AST 61 (H) 04/13/2018    ALT 34 04/25/2018    ALT 39 04/24/2018    ALT 32 04/16/2018    ALT 34 04/15/2018    ALT 51 04/13/2018    MG 2.2 04/25/2018    MG 2.4 (H) 04/24/2018         **I have reviewed all outside records as it pertains to patient's care.**      Lonzo Cloud, MD, MBA  Resident Physician PGY-3  Department of Radiation Oncology    I saw and evaluated the patient, participating in the key portions of the service.  I reviewed the resident???s note.  I agree with the resident???s findings and plan. Felix Ahmadi, MD

## 2018-04-26 NOTE — Unmapped (Signed)
Physician Discharge Summary    Admit date: 04/24/2018    Discharge date and time: 04/25/2018   17:00    Discharge to: Against Medical Advice [facility])    Discharge Service: Oncology/Hematology (MDE)    Discharge Attending Physician: Jilda Panda, MBBS    Discharge Diagnoses: Altered Mental Status due to substance abuse, Pancytopenia, Type II Demand Ischemia, Metastatic Renal Cell Carcinoma    Pertinent Test Results:  Urine Toxicology Screen:  Ref Range & Units 04/25/18 0040      Amphetamine Screen, Ur  <500 ng/mL     Barbiturate Screen, Ur  <200 ng/mL     Benzodiazepine Screen, Urine  <200 ng/mL     Cannabinoid Scrn, Ur  <20 ng/mL     Methadone Screen, Urine  =/>300 ng/mL  ABNORMAL    Cocaine(Metab.)Screen, Urine  =/>150 ng/mL  ABNORMAL     Opiate Scrn, Ur  =/>300 ng/mL  ABNORMAL         EXAM: CTA CHEST W CONTRAST  DATE: 04/25/2018 3:57 PM  ACCESSION: [id number]  DICTATED: 04/25/2018 4:06 PM  INTERPRETATION LOCATION: Main [facility]    CLINICAL INDICATION: 57 years old Male with C/f PE - metastatic RCC with hypoxemia, tachycardia and hemoptysis- ??    COMPARISON: Chest CT, 04/08/2018    TECHNIQUE: A spiral CTA scan was obtained with IV contrast from the thoracic inlet through the hemidiaphragms. Images were reconstructed in the axial plane. Multiplanar reformatted and MIP images are provided for further evaluation of the pulmonary vasculature.    FINDINGS:     Pulmonary arterial tree shows no filling defects to suggest pulmonary embolism.    AIRWAYS, LUNGS, PLEURA:   Clear central airways.  Severe increase in multifocal nodular pulmonary opacities, diffuse nodular intralobular septal thickening, and diffuse patchy groundglass opacities, reference lesions include:  -Left upper lobe, 1.7 cm, previously 0.8 cm, (4:54)  -New confluent medial right middle lobe nodular consolidation, 5.9 cm, (4:86)  -Right lower lobe, 4.8 cm, previously 4.2 cm, (4:98)     Small to medium bilateral pleural effusions, increased from prior. MEDIASTINUM:   Coronary artery calcifications. Thoracic aortic caliber within normal limits.   Mild cardiomegaly. No pericardial effusion.     Interval stability of hilar mediastinal lymphadenopathy, including left inferior paratracheal, 1.3 cm (4:66) and left hilar, 1.9 cm, (4:88).    IMAGED ABDOMEN: Short interval increase in bilateral adrenal nodularity, 1.7 cm on the right (4:156) and 1.4 cm on the left (4:71).    SOFT TISSUES: Unremarkable.    BONES: Unchanged diffuse osseous metastases and T7 compression deformity.      Impression     -No evidence of pulmonary embolism.    -Severe interval increase in nodular pulmonary opacities, and diffuse nodular intralobular septal thickening and groundglass opacities, likely worsening metastatic disease. Superimposed acute process (infection, pulmonary edema, drug toxicity, etc.) also considered.    -Increase in bilateral pleural effusions (small to medium in size).    -Short interval increase in bilateral adrenal nodularity, also concerning for worsening metastases.    -Stable hilar/mediastinal lymphadenopathy and diffuse osseous metastases.         Hospital Course:  Russell [name]??is a 57 y.o.??male??with history of polysubstance abuse, COPD and metastatic RCC no longer on chemotherapy, with poorly-controlled malignant pain requiring recent hospitalization for pain management and use of moderate-high doses of corticosteroids who??presented to [facility] with AMS (UTox + cocaine) and was found to be hypoxemic with new pancytopenia, ultimately left against medical advice (AMA) within a day of  admission.  ??  AMS, resolved: Time course, resolution, and UTox consistent with polysubstance abuse bender over past several days or so as etiology (roommate left town 5/18 and not back till 5/24 when she found the patient acutely altered, see Dr. London Sheer 5/24 H&P for more details).  AMS resolved and patient alert and oriented by early morning of 5/25 -- A/O to self, place, month, year, president, and name and phone number of mother and two close friends. Not A/O to day of month (25th), thought he had only just gotten out of the hospital a couple of days ago (discharged the 16th of this month) -- this would also be in accordance with polysubstance abuse bender of several days duration while roommate, [name], was out of town. Patient with known history of polysubstance abuse, UTox + for cocaine (though patient denies it, + for cocaine prior admission earlier this month as well) as well as methadone and opiates (expected from methadone and oxycodone Rx, though difficult to determine if patient was taking a higher quantity than prescribed and/or other opiates). Patient left AMA afternoon of 5/25 despite comprehensive discussion of his medical conditions and our medical advice he remain for further work-up of his pancytopenia. Patient possessed capacity at the time of this decision as his AMS had fully resolved.  -  Patient discharged AMA on unchanged pain regimen, no new Rx written  - Radiation oncology consulted on patient for evaluation of palliative XRT of L proximal femur (hip), foremost location of patient's pain. It is their assessment that given systemic extensive burden of disease, they would defer local treatment with XRT at this time.  Of note, when I went to speak with patient afterwards, he did not want palliative XRT as he would prefer to stay the pain pill route.  ??  Hypoxemia, improved:??Initially requiring 2L supplemental O2 via nasal cannula, but saturating 92%+ on room by time of AMA discharge.  CTA Chest with severe interval increase in nodular pulmonary opacities, and diffuse nodular intralobular septal thickening and groundglass opacities, likely worsening metastatic disease, but no PE identified.  ??  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 Drop in all of his cell counts from the week prior despite absence of recent chemotherapy. Suspect infiltrative process from worsening known bone involvement.  Alternatively he could have marrow suppression related to systemic infection or a drug effect (particularly of note is levamisole, a common contaminant in cocaine that can cause agranulocytosis). DIC labs and hemolysis labs reassuring.  - Transfused 1u pRBC on 5/24 for Hgb 6.1, improved to 7.4.  ??  Metastatic RCC, progressing:??Complicated by diffuse metastases with resultant, difficult-to-control pain.??He has progressed on first and second line chemotherapy, so initiation of??everolimus and lenvatinib??was discussed in clinic, though patient endorses not yet receiving everolimus and lenvatinib in the mail.  ??  Troponinemia, downtrending:??Troponin I peaked at 0.095 on 5/25 at 05:25am, since downtrending. Patient without chest pain or ECG changes consistent with ischemia. Likely represents??demand??mismatch in the setting of severe anemia and tachycardia on presentation, in addition to + cocaine on UTox causing potential transient coronary vasospasm.   ??  Hypertension:??Low-normal blood pressures on presentation, robustly 140s-160s by time of AMA discharge.  - Home amlodipine initially held, resumed prior to [facility] discharge  ??  Tobacco use: Patient provided with nicotine patch??14mg /24hr and nicotine gum PRN while in the hospital.     Condition at Discharge: stable  Discharge Medications:      Your Medication List      CONTINUE taking these medications  acetaminophen 500 MG tablet  Commonly known as:  TYLENOL  Take 2 tablets (1,000 mg total) by mouth three (3) times a day (at 6am, noon and 6pm).     albuterol 90 mcg/actuation inhaler  Commonly known as:  PROVENTIL HFA;VENTOLIN HFA  Inhale 2 puffs.     amLODIPine 5 MG tablet  Commonly known as:  NORVASC  Take 1 tablet (5 mg total) by mouth daily.     dexamethasone 0.5 mg/5 mL solution  Commonly known as:  DECADRON  Take 10 mL (1 mg total) by mouth Four (4) times a day. Swish and spit.     everolimus (antineoplastic) 5 mg tablet Commonly known as:  AFINITOR  Take 1 tablet (5 mg total) by mouth daily.     gabapentin 300 MG capsule  Commonly known as:  NEURONTIN  Take 3 capsules (900 mg total) by mouth Three (3) times a day.     ibuprofen 600 MG tablet  Commonly known as:  ADVIL,MOTRIN  Take 1 tablet (600 mg total) by mouth every eight (8) hours as needed for pain.     lenvatinib 18 mg/day (10 mg x 1-4 mg x2) Cap  Take 18 mg by mouth daily.     lidocaine 5 % patch  Commonly known as:  LIDODERM  Place 1 patch on the skin daily. Apply to affected area for 12 hours only each day (then remove patch)     lidocaine-diphenhydramine-aluminum-magnesium 200-25-400-40 mg/30 mL Mwsh  Commonly known as:  FIRST-MOUTHWASH BLM  SWISH, GARGLE & SPIT BY MOUTH 1 TEASPOONFUL EVERY 6 HOURS AS NEEDED. MAY BE SWALLOWED IF ESOPHAGEAL INVOLVEMENT     methadone 10 MG tablet  Commonly known as:  DOLOPHINE  Take 1 tablet (10 mg total) by mouth every eight (8) hours. Fill on or after 04/23/18     naloxone nasal spray  Commonly known as:  NARCAN  One spray in either nostril once for known/suspected opioid overdose. May repeat every 2-3 minutes in alternating nostril til EMS arrives     nicotine 14 mg/24 hr patch  Commonly known as:  NICODERM CQ  Place 1 patch on the skin daily.     nicotine polacrilex 2 mg gum  Commonly known as:  NICORETTE  Apply 1 each (2 mg total) to cheek every hour as needed for smoking cessation.     omeprazole 20 MG capsule  Commonly known as:  PriLOSEC  Take 20 mg by mouth daily. TAKE 1 CAPSULE (20 MG TOTAL) BY MOUTH DAILY.     ondansetron 4 MG disintegrating tablet  Commonly known as:  ZOFRAN-ODT  Take 2 tablets (8 mg total) by mouth every eight (8) hours as needed for nausea.     oxyCODONE 30 MG immediate release tablet  Commonly known as:  ROXICODONE  Take 1 tablet (30 mg total) by mouth every four (4) hours as needed for pain. Fill on or after 04/23/18     predniSONE 5 MG tablet  Commonly known as:  DELTASONE  30 mg 5/17, 15 mg x3 days, 10 mg x3 days, 5mg  x3 days,     SPIRIVA WITH HANDIHALER 18 mcg inhalation capsule  Generic drug:  tiotropium  Place 18 mcg into inhaler and inhale.     urea 40 % cream  Commonly known as:  CARMOL  Apply 1 application topically Two (2) times a day. To hands and feet.            Pending Test Results:  Order Current Status    Type and Screen Collected (04/24/18 2101)    Blood Culture #1 In process    Blood Culture #2 In process    Fungitell Assay In process    Urine Culture In process          Discharge Instructions:       Follow Up instructions and Outpatient Referrals     Discharge instructions      Mr. Charrette, we regret you leaving against medical advice despite your blood counts being low and requiring transfusion less than 24 hours ago. Having said that, we are pleased your confusion has resolved and you do not have a blood clot in your lungs.  Please do come back if you have worsening confusion, shortness of breath, and/or fatigue.             Appointments which have been scheduled for you    Apr 28, 2018  8:30 AM EDT  (Arrive by 8:00 AM)  RETURN PALLIATIVE CARE with Sonny Masters, MD  [facility] HEMATOLOGY ONCOLOGY [facility]ND [facility]) [address]  [place] [address]  [phone]              I spent greater than 30 minutes in the discharge of this patient.    Sandi Raveling, MD

## 2018-04-26 NOTE — Unmapped (Signed)
Pt had progressively improving mental status throughout the shift today. Pt medicated per mar for pain, pt denies the need for additional pain medication or pain interventions throughout the shift. Pt denies nausea vomiting or shortness of breath  throughout the shift. Pt oxygen requirement improved throughout the shift. Pt at time of leaving AMA was 92% on room air All other pt vital signs stable throughout the shift. Pt encouraged to stay to have blood counts monitored but patient refused and left AMA.

## 2018-04-26 NOTE — Unmapped (Signed)
Care Management  Initial Transition Planning Assessment              General  Care Manager assessed the patient by : In person interview with patient, Medical record review  Orientation Level: Oriented X4  Who provides care at home?: N/A   Extended Emergency Contact Information  Primary Emergency Contact: Fredrik Rigger [place]  Home Phone: [phone]  Relation: Friend  Secondary Emergency Contact: [name]  Mobile Phone: [phone]  Relation: Friend   Type of Residence: Mailing Address:  [address] [address]  Contacts: Accompanied by: Family member, Friend  Password: pimp  Patient Phone Number: [phone] (home)         Medical Provider(s): [facility]  Reason for Admission: Admitting Diagnosis:  No admission diagnoses are documented for this encounter.  Past Medical History:   has a past medical history of Bone metastasis (CMS-HCC) and Renal cancer (CMS-HCC).  Past Surgical History:   has a past surgical history that includes Flexible bronchoscopy (07/24/2017) and Hernia repair.   Previous admit date: 04/08/2018    Primary Insurance- Payor: MEDICAID [place] / Plan: MEDICAID [place] ACCESS / Product Type: *No Product type* /   Secondary Insurance ??? None  Prescription Coverage ??? same as above  Preferred Pharmacy - [facility] - [place], [place] - [address]  [facility] - [place] - [address]    Transportation home: Private vehicle  Level of function prior to admission: Independent    Contact/Decision Maker    Extended Emergency Contact Information  Primary Emergency Contact: Fredrik Rigger [place]  Home Phone: [phone]  Relation: Friend  Secondary Emergency Contact: [name]  Mobile Phone: [phone]  Relation: Friend    Legal Next of Kin / Guardian / POA / Advance Directives     HCDM (patient stated preference): Nikki Dom [phone]    HCDM (patient stated preference): [name] - Friend - [phone]    Advance Directive (Medical Treatment)  Does patient have an advance directive covering medical treatment?: Patient does not have advance directive covering medical treatment., Patient would not like information.  Reason patient does not have an advance directive covering medical treatment:: Patient does not wish to complete one at this time  Information provided on advance directive:: No  Patient requests assistance:: No    Patient Information  Lives with: Friends    Type of Residence: Private residence        Location/Detail: [place], [place]    Support Systems: Friends/Neighbors    Responsibilities/Dependents at home?: No    Home Care services in place prior to admission?: No    Equipment Currently Used at Home: hospital bed, walker, rolling, wheelchair, manual    Currently receiving outpatient dialysis?: No    Financial Information    Need for financial assistance?: No    Social Determinants of Health  Social Determinants of Health were addressed in provider documentation.  Please refer to patient history.    Discharge Needs Assessment  Concerns to be Addressed: denies needs/concerns at this time    Clinical Risk Factors: Principal Diagnosis: Cancer, Stroke, COPD, Heart Failure, AMI, Pneumonia, Joint Replacment    Barriers to taking medications: No    Prior overnight hospital stay or ED visit in last 90 days: Yes    Readmission Within the Last 30 Days: current reason for admission unrelated to previous  admission    Anticipated Changes Related to Illness: none    Equipment Needed After Discharge: none    Discharge Facility/Level of Care Needs: other (see comments)(Home)    Readmission  Risk of Unplanned Readmission Score: UNPLANNED READMISSION SCORE: 34%  Readmitted Within the Last 30 Days? Yes  Patient at risk for readmission?: Yes    Discharge Plan  Screen findings are: Discharge planning needs identified or anticipated (Comment).    Expected Discharge Date: 04/25/18    Expected Transfer from Critical Care:      Patient and/or family were provided with choice of facilities / services that are available and appropriate to meet post hospital care needs?: Yes   List choices in order highest to lowest preferred, if applicable. : No preference of agency    Initial Assessment complete?: Yes

## 2018-04-27 ENCOUNTER — Ambulatory Visit: Admit: 2018-04-27 | Discharge: 2018-04-29 | Disposition: A | Discharge status: Home Health | Payer: MEDICAID

## 2018-04-27 DIAGNOSIS — R0602 Shortness of breath: Principal | ICD-10-CM

## 2018-04-27 LAB — TOXICOLOGY SCREEN, URINE
AMPHETAMINE SCREEN URINE: <500
BARBITURATE SCREEN URINE: <200
BENZODIAZEPINE SCREEN, URINE: <200
COCAINE(METAB.)SCREEN, URINE: <150

## 2018-04-27 LAB — ALBUMIN: Albumin:MCnc:Pt:Ser/Plas:Qn:: 2.5 — ABNORMAL LOW

## 2018-04-27 LAB — TROPONIN I: Troponin I.cardiac:MCnc:Pt:Ser/Plas:Qn:: 0.062

## 2018-04-27 LAB — URINALYSIS WITH CULTURE REFLEX
BILIRUBIN UA: NEGATIVE
LEUKOCYTE ESTERASE UA: NEGATIVE
NITRITE UA: NEGATIVE
PH UA: 6 (ref 5.0–9.0)
RBC UA: 26 /HPF — ABNORMAL HIGH (ref <=3)
SPECIFIC GRAVITY UA: 1.017 (ref 1.003–1.030)
SQUAMOUS EPITHELIAL: <1 /HPF (ref 0–5)
UROBILINOGEN UA: 2 — AB
WBC UA: <1 /HPF (ref <=2)

## 2018-04-27 LAB — COMPREHENSIVE METABOLIC PANEL
ALBUMIN: 2.5 g/dL — ABNORMAL LOW (ref 3.5–5.0)
ALKALINE PHOSPHATASE: 158 U/L — ABNORMAL HIGH (ref 38–126)
ALT (SGPT): 30 U/L (ref 19–72)
ANION GAP: 8 mmol/L — ABNORMAL LOW (ref 9–15)
AST (SGOT): 58 U/L — ABNORMAL HIGH (ref 19–55)
BILIRUBIN TOTAL: 0.5 mg/dL (ref 0.0–1.2)
BLOOD UREA NITROGEN: 20 mg/dL (ref 7–21)
BUN / CREAT RATIO: 38
CALCIUM: 7.8 mg/dL — ABNORMAL LOW (ref 8.5–10.2)
CO2: 24 mmol/L (ref 22.0–30.0)
CREATININE: 0.52 mg/dL — ABNORMAL LOW (ref 0.70–1.30)
EGFR MDRD AF AMER: >=60 mL/min/{1.73_m2} (ref >=60)
EGFR MDRD NON AF AMER: >=60 mL/min/{1.73_m2} (ref >=60)
POTASSIUM: 4 mmol/L (ref 3.5–5.0)
PROTEIN TOTAL: 5 g/dL — ABNORMAL LOW (ref 6.5–8.3)
SODIUM: 136 mmol/L (ref 135–145)

## 2018-04-27 LAB — CBC W/ AUTO DIFF
BASOPHILS ABSOLUTE COUNT: 0 10*9/L (ref 0.0–0.1)
BASOPHILS RELATIVE PERCENT: 1 %
EOSINOPHILS ABSOLUTE COUNT: 0 10*9/L (ref 0.0–0.4)
EOSINOPHILS RELATIVE PERCENT: 0.3 %
HEMATOCRIT: 21.9 % — ABNORMAL LOW (ref 41.0–53.0)
LARGE UNSTAINED CELLS: 4 % (ref 0–4)
LYMPHOCYTES ABSOLUTE COUNT: 0.8 10*9/L — ABNORMAL LOW (ref 1.5–5.0)
LYMPHOCYTES RELATIVE PERCENT: 21.6 %
MEAN CORPUSCULAR HEMOGLOBIN CONC: 31.7 g/dL (ref 31.0–37.0)
MEAN CORPUSCULAR HEMOGLOBIN: 26.8 pg (ref 26.0–34.0)
MEAN PLATELET VOLUME: 9.3 fL (ref 7.0–10.0)
MONOCYTES ABSOLUTE COUNT: 0.3 10*9/L (ref 0.2–0.8)
MONOCYTES RELATIVE PERCENT: 6.8 %
NEUTROPHILS ABSOLUTE COUNT: 2.5 10*9/L (ref 2.0–7.5)
NEUTROPHILS RELATIVE PERCENT: 66.7 %
PLATELET COUNT: 68 10*9/L — ABNORMAL LOW (ref 150–440)
RED BLOOD CELL COUNT: 2.59 10*12/L — ABNORMAL LOW (ref 4.50–5.90)
WBC ADJUSTED: 3.8 10*9/L — ABNORMAL LOW (ref 4.5–11.0)

## 2018-04-27 LAB — BARBITURATE SCREEN URINE: Lab: <200

## 2018-04-27 LAB — PRO-BNP: Natriuretic peptide.B prohormone N-Terminal:MCnc:Pt:Ser/Plas:Qn:: 522 — ABNORMAL HIGH

## 2018-04-27 LAB — WBC UA: Lab: <1

## 2018-04-27 LAB — HEMATOCRIT: Lab: 21.9 — ABNORMAL LOW

## 2018-04-27 LAB — CREATINE KINASE TOTAL: Creatine kinase:CCnc:Pt:Ser/Plas:Qn:: 31 — ABNORMAL LOW

## 2018-04-27 NOTE — Unmapped (Signed)
Pt rounds completed. Call bell in reach, belongings at bedside, bed locked and low. Pt resting comfortably with no acute distress noted. Equal and unlabored respirations. Stating no needs at this time.

## 2018-04-27 NOTE — Unmapped (Signed)
Pt here with c/o CP   Pt was seen, admitted and left AMA   Pt sts not able to control pain at home  Pt Dx with bone Ca

## 2018-04-27 NOTE — Unmapped (Signed)
MRI Screening Form needs to be filled out in EPIC in order to proceed with examination. Please ensure to list all surgeries and implants.

## 2018-04-27 NOTE — Unmapped (Signed)
Meal tray ordered 

## 2018-04-27 NOTE — Unmapped (Signed)
[facility]   Emergency Department Provider Note    ED Final Clinical Impression     Final diagnoses:   Dyspnea, unspecified type (Primary)   Pancytopenia (CMS-HCC)         ED Course, Assessment and Plan     Initial Clinical Impression:    Russell [name] is a 57 y.o. 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 Patient recently seen 2 days ago when he presented with altered mental status and positive U tox for cocaine and was found to be hypoxemic with new pancytopenia.  He had a CTA chest done that was negative for pulmonary embolism (04/25/2018) he left AMA within a day of admission.  He returns today with increased shortness of breath.  On my exam, mild tachycardia, vital signs otherwise stable.  Patient satting well on 1 L nasal cannula. Patient well-appearing, no apparent distress.  Heart regular rhythm, normal S1-S2.  Lungs with bibasilar crackles, no wheezing.  +1 bilateral edema to ankles.  Differential includes ACS, PE, pneumonia, CHF.  Concern for possible ACS secondary to cocaine use although patient adamantly denies any recent illicit drug use.  Possible pulmonary embolism, however patient had recent CT scan 2 days ago that was negative.  It was found to show worsening bilateral pleural effusions which could be contributing to his shortness of breath.  Patient was also found to be pancytopenic on last admission acquired transfusion, could likely be cause of dyspnea. Possible new CHF due to increased dyspnea with exertion and bilateral ankle swelling.  Plan for CBC, CMP, EKG, troponin, chest x-ray, BNP.  We will give conservative IV fluids and cancer pain control.      ED Course:    0800 - Labs significant for pancytopenia with hemoglobin 6.9, likely the cause of his dyspnea.  Will transfuse 1 unit RBC.  Troponinemia at baseline.  EKG significant for sinus tachycardia otherwise reassuring, no signs of ischemia. Plan for admission.    [email or web address]  - I discussed case with the Med E resident, they will admit patient to their service.    I have discussed the case with the Attending Physician, Dr. Massie Maroon who was in agreement with the plan as described above.  ____________________________________________    Time seen: Apr 27, 2018 6:58 AM     History     CHIEF COMPLAINT:   SOB, chest pain      HPI:   Russell [name] is a 57 y.o. male with PMH of polysubstance abuse, COPD and metastatic RCC no longer on chemotherapy who presents to the ED for evaluation of chest pain, joint pain, and shortness of breath.  Patient was recently admitted to the hospital for chest pain and cancer pain 2 days ago.  He was found to have a new O2 requirement required blood transfusion.  However, patient states he left to go home too early but realizes it was mistake. Since then, patient reports his shortness of breath has increased.  He does not have oxygen at home.The dyspnea is non positional. He endorses a nonproductive cough and had blood tinged mucus today.  Family at bedside reports the patient may have increased swelling in both of his feet.  Patient smokes cigarettes daily.  He denies any cocaine or illicit drug use.  Patient's chest pain is bilaterally and points to his lateral chest wall.  He describes it as throbbing.  No radiation.  No fever or diaphoresis.  He reports his  joint pain is in his hips and knees and notes it is his cancer related pain.  Both his chest pain and joint pain is unchanged.  He denies any history of DVT/PE. He reports that he had a CAT scan done for evaluation of pulmonary embolism which was negative.        ____________________________________________    PAST MEDICAL HISTORY/PAST SURGICAL HISTORY:   Past Medical History:   Diagnosis Date   ??? Bone metastasis (CMS-HCC)    ??? Renal cancer (CMS-HCC)        MEDICATIONS:     Current Facility-Administered Medications:   ???  acetaminophen (TYLENOL) tablet 1,000 mg, 1,000 mg, Oral, TID, Fanny Dance, MD  ??? albuterol (PROVENTIL HFA;VENTOLIN HFA) 90 mcg/actuation inhaler 2 puff, 2 puff, Inhalation, Q6H PRN, Fanny Dance, MD  ???  enoxaparin (LOVENOX) syringe 40 mg, 40 mg, Subcutaneous, Q24H SCH, Fanny Dance, MD, 40 mg at 04/27/18 1555  ???  gabapentin (NEURONTIN) capsule 900 mg, 900 mg, Oral, TID, Fanny Dance, MD, 900 mg at 04/27/18 1555  ???  ibuprofen (ADVIL,MOTRIN) tablet 600 mg, 600 mg, Oral, Q8H PRN, Fanny Dance, MD, 600 mg at 04/27/18 1610  ???  lidocaine (LIDODERM) 5 % patch 1 patch, 1 patch, Transdermal, Q24H, Fanny Dance, MD, 1 patch at 04/27/18 1555  ???  ondansetron (ZOFRAN-ODT) disintegrating tablet 8 mg, 8 mg, Oral, Q8H PRN, Fanny Dance, MD  ???  pantoprazole (PROTONIX) EC tablet 40 mg, 40 mg, Oral, Daily, Fanny Dance, MD, 40 mg at 04/27/18 1555  ???  tiotropium (SPIRIVA) 18 mcg inhalation capsule 18 mcg, 18 mcg, Inhalation, Daily (RT), Fanny Dance, MD, 18 mcg at 04/27/18 1540    ALLERGIES:   Patient has no known allergies.    SOCIAL HISTORY:   Social History     Tobacco Use   ??? Smoking status: Current Every Day Smoker     Packs/day: 1.00     Types: Cigarettes   ??? Smokeless tobacco: Never Used   Substance Use Topics   ??? Alcohol use: Yes       FAMILY HISTORY:  Family History   Problem Relation Age of Onset   ??? Brain cancer Paternal Uncle         ____________________________________________    Review of Systems  See HPI  Constitutional: no fever, no chills   Eyes: no drainage   ENT: no nasal congestion, no sore throat  Cardiovascular:  + chest pain   Resp: + SOB, no cough   Gastrointestinal: no nausea, no vomiting, no diarrhea  Genitourinary: no dysuria  Integumentary: no rash   Allergy: no hives  Musculoskeletal: + leg swelling, no joint pain   Neurological: no slurred speech, no headache  A 10 point review of systems was performed and is negative other than positive elements noted in HPI     Physical Exam     VITAL SIGNS:    BP 140/91  - Pulse 112  - Temp 36.5 ??C (97.7 ??F) (Oral)  - Resp 22 - Ht 170.2 cm (5' 7.01)  - Wt 85 kg (187 lb 6.3 oz)  - SpO2 98%  - BMI 29.34 kg/m??     Constitutional: Alert and oriented. Well appearing and in no distress.  Head: Normocephalic and atraumatic  Eyes: EOMI, PERRL, conjunctiva normal  ENT: External ears normal, bilateral nares clear, MMM  Cardiovascular: Normal rate, regular rhythm. Normal and symmetric distal pulses are present in all extremities.  Respiratory:  Normal respiratory effort. Breath sounds are normal.  Gastrointestinal: Soft, non-distended and nontender. There is no CVA tenderness.  Musculoskeletal: Nontender with normal range of motion in all extremities. 1+ bilateral edema to ankle  Neurologic: Normal speech and language. No gross focal neurologic deficits are appreciated.  Skin: Skin is warm, dry and intact. No rash noted.  Psychiatric: Mood and affect are normal. Speech and behavior are normal.      EKG      Name: KASSIM GUERTIN   Age: 57 y.o.   Gender: male       Rate: 112   Rhythm: sinus tachycardia   Narrative Interpretation: Normal sinus rhythm, normal intervals, no ST segment elevations or depressions, no abnormal T-wave inversions, normal morphology.      Radiology     XR Chest 2 views   Final Result      -Unchanged diffuse pulmonary nodular and reticular opacities as well as consolidative right hilar and basilar opacities. Findings could reflect worsening metastatic disease with possible superimposed infection or pulmonary edema.      -Small bilateral pleural effusions. No pneumothorax.      -Stable cardiomediastinal silhouette.      MRI Cervical Thoracic Lumbar Spine W Wo Contrast    (Results Pending)   MRI Lower Extremity Joint Left W Wo Contrast    (Results Pending)           Pertinent labs & imaging results that were available during my care of the patient were reviewed by me and considered in my medical decision making. Labs and radiology studies included in my note may not constitute all ordered/reviewied labs during this encounter (see chart for details).    Portions of this record have been created using Scientist, clinical (histocompatibility and immunogenetics). Dictation errors have been sought, but may not have been identified and corrected.     Jacelyn Grip, MD  Resident  04/27/18 [phone]

## 2018-04-27 NOTE — Unmapped (Signed)
Inpatient team in to see pt. MD turned pts O2 off without notifying RN. RN in to pts bedside because pts o2 sat had been sitting at 87 for around a minute. RN found pts O2 off and pt stated the MD turned O2 off. RN turned O2 back to 2L [place]. Pt o2 sat is now 95%.

## 2018-04-27 NOTE — Unmapped (Signed)
Patient returned from X-ray  Transported by Radiology  How tranported Stretcher  Cardiac Monitor yes

## 2018-04-27 NOTE — Unmapped (Signed)
Report received from [facility], RN. Care transferred at this time.

## 2018-04-27 NOTE — Unmapped (Signed)
Patient transported to X-ray  Transported by Radiology  How tranported Stretcher  Cardiac Monitor yes

## 2018-04-27 NOTE — Unmapped (Addendum)
Pt rounds completed. Call bell in reach, belongings at bedside, bed locked and low. Pt resting comfortably with no acute distress noted. Equal and unlabored respirations. Pt requesting more pain medication at this time. MD notified.     Pt Reassessment:    Pt assessment remains largely unchanged from previous assessment. Pt remains A&Ox4, GCS 15, calm, cooperative, NAD. Pt endorses pain allover body.  ST on monitor. Skin warm, dry, intact. Breathing even and unlabored, CTA bilaterally, (-) SOB, (-) CP.

## 2018-04-27 NOTE — Unmapped (Addendum)
E2 Solid Oncology History and Physical    Assessment/Plan:    Active Problems:    Metastatic renal cell carcinoma to bone (CMS-HCC)    Cancer related pain      Russell [name] is a 57 y.o. male with history of polysubstance abuse, COPD and metastatic RCC no longer on chemotherapy, with poorly-controlled malignant pain requiring recent hospitalization for pain management and use of moderate-high doses of corticosteroids who presented to [facility] with shortness of breath, anxiety, and desire to discuss cancer treatment vs palliation options.    Shortness of breath - resolved  Likely in setting of lung disease and anxiety.  Was on 2L [place] in ED  Maintain sats > 90%    Hip pain  -MRI hip  -pain regimen as below    Metastatic RCC:??Complicated by diffuse metastases with resultant, difficult-to-control pain. Although there is concern about encephalopathy, he still appears to be in considerable pain on exam and he is not somnolent or bradypneic so will continue home analgesic regimen for now. He has progressed on first and second line chemotherapy, so initiation of everolimus and lenvatinib was discussed in clinic though current presentation is concerning for potential accelerated progression of disease in the interim.  - Palliative care consulted, hopefully will see 5/28 AM  - Acetaminophen 1000mg  TID  - Ibuprofen 600mg  q6h PRN  - Pantoprazole 40 mg daily  - MRI total spine  - consider toradol for breakthrough pain  - will consider methadone 15mg  TID; oxycodone 30mg  Q4h PRN     Substance abuse  UDS clean of cocaine on admission; positive for opiates and methadone  -CTM  -Encourage cessation    Hypertension: Low-normal blood pressures and potential for hypotension with a number of the above-mentioned differential diagnoses.  - Hold amlodipine  ??  Tobacco use:  - Nicotine patch 14mg /24hr  - Nicotine gum PRN  ??  FEN/GI/PPx:  Diet - regular  IVF - none  Replete electrolytes PRN  GI - pantroprazole 20mg  daily  VTE - enoxaparin 40mg  daily  ??  Code status: Full - no living will or ACP note per Dr. Caprice Red note on 5/22  Decision makers: [name] and [name]  Dispo: solid oncology service, floor status  Access: PIV x 1        Code Status:  Full Code  ___________________________________________________________________    Chief Complaint  No chief complaint on file.      HPI:  Russell [name] is a 56 y.o. male with PMHx as noted below that presents to [facility] with shortness of breath.    He was at his mother's home and became more anxious and short of breath. He called 911 and came to ED.    He has thought about his decision to leave AMA on 5/25 and realizes that he was wrong. He is interested in discussing what treatment vs palliation options may be.    In ED, VSS. Able to wean to room air. Having pain in L hip.    Allergies:  Patient has no known allergies.    Medications:   Prior to Admission medications    Medication Dose, Route, Frequency   acetaminophen (TYLENOL) 500 MG tablet 1,000 mg, Oral, 3 times a day   albuterol (PROVENTIL HFA;VENTOLIN HFA) 90 mcg/actuation inhaler 2 puffs, Inhalation   amLODIPine (NORVASC) 5 MG tablet 5 mg, Oral, Daily (standard)   dexamethasone (DECADRON) 0.5 mg/5 mL solution 1 mg, Oral, 4 times a day, Swish and spit.   everolimus,  antineoplastic, (AFINITOR) 5 mg tablet 5 mg, Oral, Daily (standard)   gabapentin (NEURONTIN) 300 MG capsule 900 mg, Oral, 3 times a day (standard)   ibuprofen (ADVIL,MOTRIN) 600 MG tablet 600 mg, Oral, Every 8 hours PRN   lenvatinib 18 mg/day (10 mg x 1-4 mg x2) cap 18 mg, Oral, Daily (standard)   lidocaine (LIDODERM) 5 % patch 1 patch, Transdermal, Every 24 hours, Apply to affected area for 12 hours only each day (then remove patch)    lidocaine-diphenhydramine-aluminum-magnesium (FIRST-MOUTHWASH BLM) 200-25-400-40 mg/30 mL Mwsh SWISH, GARGLE & SPIT BY MOUTH 1 TEASPOONFUL EVERY 6 HOURS AS NEEDED. MAY BE SWALLOWED IF ESOPHAGEAL INVOLVEMENT   methadone (DOLOPHINE) 10 MG tablet 10 mg, Oral, Every 8 hours, Fill on or after 04/23/18   naloxone (NARCAN) 4 mg nasal spray One spray in either nostril once for known/suspected opioid overdose. May repeat every 2-3 minutes in alternating nostril til EMS arrives   nicotine (NICODERM CQ) 14 mg/24 hr patch 1 patch, Transdermal, Daily (standard)   nicotine polacrilex (NICORETTE) 2 mg gum 2 mg, Buccal, Every 1 hour prn   omeprazole (PRILOSEC) 20 MG capsule 20 mg, Oral, Daily (standard), TAKE 1 CAPSULE (20 MG TOTAL) BY MOUTH DAILY.   ondansetron (ZOFRAN-ODT) 4 MG disintegrating tablet 8 mg, Oral, Every 8 hours PRN   oxyCODONE (ROXICODONE) 30 MG immediate release tablet 30 mg, Oral, Every 4 hours PRN, Fill on or after 04/23/18   predniSONE (DELTASONE) 5 MG tablet 30 mg 5/17, 15 mg x3 days, 10 mg x3 days, 5mg  x3 days,   tiotropium (SPIRIVA WITH HANDIHALER) 18 mcg inhalation capsule 18 mcg, Inhalation   urea (CARMOL) 40 % cream 1 application, Topical, 2 times a day (standard), To hands and feet.       Medical History:  Past Medical History:   Diagnosis Date   ??? Bone metastasis (CMS-HCC)    ??? Renal cancer (CMS-HCC)        Surgical History:  Past Surgical History:   Procedure Laterality Date   ??? FLEXIBLE BRONCHOSCOPY  07/24/2017   ??? HERNIA REPAIR         Social History:  Tobacco use:   reports that he has been smoking cigarettes.  He has been smoking about 1.00 pack per day. He has never used smokeless tobacco.  Alcohol use:   reports that he drinks alcohol.  Drug use:  reports that he has current or past drug history. Drug: Cocaine.  Living situation: lives with mom sometimes; with room mates at other times.    Family History:  Family History   Problem Relation Age of Onset   ??? Brain cancer Paternal Uncle        Review of Systems:  10 systems reviewed and are negative unless otherwise mentioned in HPI      Physical Exam:  Temp:  [36.5 ??C-37 ??C] 36.5 ??C  Heart Rate:  [107-117] 112  SpO2 Pulse:  [107-114] 114  Resp:  [20-30] 22  BP: (126-155)/(78-97) 140/91 SpO2:  [93 %-98 %] 98 %  Body mass index is 29.34 kg/m??.    General: chronically ill appearing man lying in ED gurney, NAD  Head: atramautic, normocephalic  Eyes: EOMI  Nose: no nasal discharge; Haskell in place  Mouth: clear oropharynx, moist mucous membranes  CV: tachycardic but regular rhythm, no m/r/g  Pulm: coarse lung sounds diffusely; normal wob on room air  Abd: soft, nt, nd, bs present  Skin: WWP  Neuro: AOx3, no focal deficits  Psych: appropriate mood  and affect  Extremities: moving all extremities well, good pulses throughout    Test Results:  Data Review:    All lab results last 24 hours:    Recent Results (from the past 24 hour(s))   ECG 12 lead (Adult)    Collection Time: 04/27/18  7:14 AM   Result Value Ref Range    EKG Systolic BP  mmHg    EKG Diastolic BP  mmHg    EKG Ventricular Rate 113 BPM    EKG Atrial Rate 113 BPM    EKG P-R Interval 148 ms    EKG QRS Duration 98 ms    EKG Q-T Interval 332 ms    EKG QTC Calculation 455 ms    EKG Calculated P Axis 104 degrees    EKG Calculated R Axis 163 degrees    EKG Calculated T Axis 141 degrees    QTC Fredericia 410 ms   Comprehensive Metabolic Panel    Collection Time: 04/27/18  7:22 AM   Result Value Ref Range    Sodium 136 135 - 145 mmol/L    Potassium 4.0 3.5 - 5.0 mmol/L    Chloride 104 98 - 107 mmol/L    CO2 24.0 22.0 - 30.0 mmol/L    BUN 20 7 - 21 mg/dL    Creatinine 1.30 (L) 0.70 - 1.30 mg/dL    BUN/Creatinine Ratio 38     EGFR MDRD Non Af Amer >=60 >=60 mL/min/1.14m2    EGFR MDRD Af Amer >=60 >=60 mL/min/1.19m2    Anion Gap 8 (L) 9 - 15 mmol/L    Glucose 95 65 - 179 mg/dL    Calcium 7.8 (L) 8.5 - 10.2 mg/dL    Albumin 2.5 (L) 3.5 - 5.0 g/dL    Total Protein 5.0 (L) 6.5 - 8.3 g/dL    Total Bilirubin 0.5 0.0 - 1.2 mg/dL    AST 58 (H) 19 - 55 U/L    ALT 30 19 - 72 U/L    Alkaline Phosphatase 158 (H) 38 - 126 U/L   Troponin I    Collection Time: 04/27/18  7:22 AM   Result Value Ref Range    Troponin I 0.062 (HH) <0.034 ng/mL   CBC w/ Differential Collection Time: 04/27/18  7:22 AM   Result Value Ref Range    WBC 3.8 (L) 4.5 - 11.0 10*9/L    RBC 2.59 (L) 4.50 - 5.90 10*12/L    HGB 6.9 (L) 13.5 - 17.5 g/dL    HCT 86.5 (L) 78.4 - 53.0 %    MCV 84.5 80.0 - 100.0 fL    MCH 26.8 26.0 - 34.0 pg    MCHC 31.7 31.0 - 37.0 g/dL    RDW 69.6 (H) 29.5 - 15.0 %    MPV 9.3 7.0 - 10.0 fL    Platelet 68 (L) 150 - 440 10*9/L    Variable HGB Concentration Slight (A) Not Present    Neutrophils % 66.7 %    Lymphocytes % 21.6 %    Monocytes % 6.8 %    Eosinophils % 0.3 %    Basophils % 1.0 %    Absolute Neutrophils 2.5 2.0 - 7.5 10*9/L    Absolute Lymphocytes 0.8 (L) 1.5 - 5.0 10*9/L    Absolute Monocytes 0.3 0.2 - 0.8 10*9/L    Absolute Eosinophils 0.0 0.0 - 0.4 10*9/L    Absolute Basophils 0.0 0.0 - 0.1 10*9/L    Large Unstained Cells 4 0 - 4 %  Microcytosis Slight (A) Not Present    Anisocytosis Slight (A) Not Present    Hypochromasia Marked (A) Not Present   Pro-BNP    Collection Time: 04/27/18  7:22 AM   Result Value Ref Range    PRO-BNP 522.0 (H) 0.0 - 177.0 pg/mL   Total CK (Creatine Kinase)    Collection Time: 04/27/18  7:22 AM   Result Value Ref Range    Creatine Kinase, Total 31.0 (L) 45.0 - 250.0 U/L   Toxicology Screen, Urine    Collection Time: 04/27/18  7:38 AM   Result Value Ref Range    Amphetamine Screen, Ur <500 ng/mL Not Applicable    Barbiturate Screen, Ur <200 ng/mL Not Applicable    Benzodiazepine Screen, Urine <200 ng/mL Not Applicable    Cannabinoid Scrn, Ur <20 ng/mL Not Applicable    Methadone Screen, Urine =/>300 ng/mL (A) Not Applicable    Cocaine(Metab.)Screen, Urine <150 ng/mL Not Applicable    Opiate Scrn, Ur =/>300 ng/mL (A) Not Applicable   Urinalysis with Culture Reflex    Collection Time: 04/27/18  7:38 AM   Result Value Ref Range    Color, UA Yellow     Clarity, UA Clear     Specific Gravity, UA 1.017 1.003 - 1.030    pH, UA 6.0 5.0 - 9.0    Leukocyte Esterase, UA Negative Negative    Nitrite, UA Negative Negative    Protein, UA Trace (A) Negative    Glucose, UA Negative Negative    Ketones, UA 20 mg/dL (A) Negative    Urobilinogen, UA 2.0 mg/dL (A) 0.2 mg/dL, 1.0 mg/dL    Bilirubin, UA Negative Negative    Blood, UA Small (A) Negative    RBC, UA 26 (H) <=3 /HPF    WBC, UA <1 <=2 /HPF    Squam Epithel, UA <1 0 - 5 /HPF    Bacteria, UA Rare (A) None Seen /HPF    Mucus, UA Occasional (A) None Seen /HPF   ABO/Rh Only    Collection Time: 04/27/18  7:56 AM   Result Value Ref Range    Reject/Recollect REJECT    Type and Screen    Collection Time: 04/27/18  7:56 AM   Result Value Ref Range    Antibody Screen NEG    ABO/RH    Collection Time: 04/27/18  7:56 AM   Result Value Ref Range    Blood Type A NEG    Prepare RBC    Collection Time: 04/27/18  9:43 AM   Result Value Ref Range    Crossmatch Compatible     Unit Blood Type O Neg     ISBT Number 9500     Unit # [id number]     Status Issued     Spec Expiration [id number]     Product ID Red Blood Cells     PRODUCT CODE [id number]        Imaging: Radiology studies were personally reviewed    EKG: sinus tachycardia

## 2018-04-27 NOTE — Unmapped (Signed)
Pt admitted from ED.  VSS. Afebrile at time of admission. POC initiated. Oriented to room and unit. NAD noted

## 2018-04-28 DIAGNOSIS — R0602 Shortness of breath: Principal | ICD-10-CM

## 2018-04-28 LAB — CBC W/ AUTO DIFF
BASOPHILS ABSOLUTE COUNT: 0.1 10*9/L (ref 0.0–0.1)
BASOPHILS RELATIVE PERCENT: 1.3 %
EOSINOPHILS ABSOLUTE COUNT: 0 10*9/L (ref 0.0–0.4)
EOSINOPHILS RELATIVE PERCENT: 0.6 %
HEMATOCRIT: 25.8 % — ABNORMAL LOW (ref 41.0–53.0)
HEMOGLOBIN: 8.4 g/dL — ABNORMAL LOW (ref 13.5–17.5)
LYMPHOCYTES ABSOLUTE COUNT: 1.2 10*9/L — ABNORMAL LOW (ref 1.5–5.0)
LYMPHOCYTES RELATIVE PERCENT: 25.7 %
MEAN CORPUSCULAR HEMOGLOBIN CONC: 32.5 g/dL (ref 31.0–37.0)
MEAN CORPUSCULAR HEMOGLOBIN: 27.1 pg (ref 26.0–34.0)
MEAN CORPUSCULAR VOLUME: 83.4 fL (ref 80.0–100.0)
MEAN PLATELET VOLUME: 9.3 fL (ref 7.0–10.0)
MONOCYTES ABSOLUTE COUNT: 0.2 10*9/L (ref 0.2–0.8)
MONOCYTES RELATIVE PERCENT: 4.7 %
NEUTROPHILS ABSOLUTE COUNT: 2.9 10*9/L (ref 2.0–7.5)
NEUTROPHILS RELATIVE PERCENT: 63.7 %
NUCLEATED RED BLOOD CELLS: 12 /100{WBCs} — ABNORMAL HIGH (ref <=4)
PLATELET COUNT: 68 10*9/L — ABNORMAL LOW (ref 150–440)
RED BLOOD CELL COUNT: 3.1 10*12/L — ABNORMAL LOW (ref 4.50–5.90)
RED CELL DISTRIBUTION WIDTH: 17.7 % — ABNORMAL HIGH (ref 12.0–15.0)
WBC ADJUSTED: 4.5 10*9/L (ref 4.5–11.0)

## 2018-04-28 LAB — BASIC METABOLIC PANEL
ANION GAP: 8 mmol/L — ABNORMAL LOW (ref 9–15)
BLOOD UREA NITROGEN: 15 mg/dL (ref 7–21)
BUN / CREAT RATIO: 32
CALCIUM: 8 mg/dL — ABNORMAL LOW (ref 8.5–10.2)
CHLORIDE: 103 mmol/L (ref 98–107)
CO2: 25 mmol/L (ref 22.0–30.0)
CREATININE: 0.47 mg/dL — ABNORMAL LOW (ref 0.70–1.30)
EGFR MDRD NON AF AMER: >=60 mL/min/{1.73_m2} (ref >=60)
GLUCOSE RANDOM: 103 mg/dL (ref 65–179)
POTASSIUM: 4 mmol/L (ref 3.5–5.0)
SODIUM: 136 mmol/L (ref 135–145)

## 2018-04-28 LAB — CREATININE: Creatinine:MCnc:Pt:Ser/Plas:Qn:: 0.47 — ABNORMAL LOW

## 2018-04-28 LAB — COMPREHENSIVE METABOLIC PANEL
ALKALINE PHOSPHATASE: 206 U/L — ABNORMAL HIGH (ref 38–126)
ALT (SGPT): 34 U/L (ref 19–72)
ANION GAP: 8 mmol/L — ABNORMAL LOW (ref 9–15)
BILIRUBIN TOTAL: 0.8 mg/dL (ref 0.0–1.2)
BLOOD UREA NITROGEN: 15 mg/dL (ref 7–21)
BUN / CREAT RATIO: 32
CALCIUM: 8 mg/dL — ABNORMAL LOW (ref 8.5–10.2)
CHLORIDE: 103 mmol/L (ref 98–107)
CO2: 25 mmol/L (ref 22.0–30.0)
CREATININE: 0.47 mg/dL — ABNORMAL LOW (ref 0.70–1.30)
EGFR MDRD AF AMER: >=60 mL/min/{1.73_m2} (ref >=60)
EGFR MDRD NON AF AMER: >=60 mL/min/{1.73_m2} (ref >=60)
GLUCOSE RANDOM: 103 mg/dL (ref 65–179)
POTASSIUM: 4 mmol/L (ref 3.5–5.0)
PROTEIN TOTAL: 5.5 g/dL — ABNORMAL LOW (ref 6.5–8.3)
SODIUM: 136 mmol/L (ref 135–145)

## 2018-04-28 LAB — BASOPHILS ABSOLUTE COUNT: Lab: 0.1

## 2018-04-28 LAB — SMEAR REVIEW

## 2018-04-28 LAB — PHOSPHORUS: Phosphate:MCnc:Pt:Ser/Plas:Qn:: 2.1 — ABNORMAL LOW

## 2018-04-28 LAB — BUN / CREAT RATIO: Urea nitrogen/Creatinine:MRto:Pt:Ser/Plas:Qn:: 32

## 2018-04-28 LAB — HEPATIC FUNCTION PANEL
ALBUMIN: 2.7 g/dL — ABNORMAL LOW (ref 3.5–5.0)
ALKALINE PHOSPHATASE: 206 U/L — ABNORMAL HIGH (ref 38–126)
AST (SGOT): 83 U/L — ABNORMAL HIGH (ref 19–55)
BILIRUBIN DIRECT: 0.3 mg/dL (ref 0.00–0.40)
PROTEIN TOTAL: 5.5 g/dL — ABNORMAL LOW (ref 6.5–8.3)

## 2018-04-28 LAB — ALBUMIN: Albumin:MCnc:Pt:Ser/Plas:Qn:: 2.7 — ABNORMAL LOW

## 2018-04-28 LAB — MAGNESIUM: Magnesium:MCnc:Pt:Ser/Plas:Qn:: 2.1

## 2018-04-28 MED FILL — LENVIMA 18MG DAILY DOSE (BOX)/10MG/4MG/CPPK: LENVIMA 18MG DAILY DOSE (BOX)/10MG/4MG/CPPK | 30 days supply | Qty: 1 | Fill #0

## 2018-04-28 MED FILL — AFINITOR/5MG/TABS: AFINITOR/5MG/TABS | 28 days supply | Qty: 28 | Fill #0

## 2018-04-28 NOTE — Unmapped (Signed)
[facility] Onboarding (see note from 5/22 for clinical onboarding)    Specialty Medication(s): Lenvatinib/Everolimus  Diagnosis: metastatic RCC  Refills: 30 day    Russell [name], DOB: 22-Nov-1961  Phone: [phone] (home)   Confirmed Shipping address: [address]  [name] [address]  All above HIPAA information was verified with patient.    Next Scheduled Delivery Date: 04/30/18 from [facility]     Financial/Shipment   Primary Billing: Medicaid  Anticipated copay of $6 reviewed with patient (See full details under Referrals tab in EPIC).    Verified delivery address in FSI and reviewed medication storage requirement.    The following was explained to the patient:  Advised patient of the following:  -A Welcome packet will be sent to the patient   -Assignment of Benefit for the patient to review and return before next refill  -Arrangement of payment method can be done by contacting the pharmacy  -Take medications with during travel, have doctor's appointments, or if being admitted to the hospital.    Advised patient of refill order process:  Specialty pharmacy process for medication shipment was also reviewed.  Discussed the service provided by [facility] with respects to monthly outbound calls to the patient to set up refill deliveries 7-10 days prior to their subsequent needed refill.  Emphasized need for patient to be reachable in order to schedule medication shipment and that shipment will be shipped to the deliverable address provided via UPS.  Informed patient that welcome packet will be sent.        Provided the [facility] pharmacy contact information below:  [facility] [phone], option 4)    Medication Education   Patient was counseled on this medication previously please see prior note.     Patient verbalized understanding of the above information.  Answered all patient questions.  [facility] team will follow up with patient monthly for standard refill processing and delivery.      Laverna Peace PharmD, BCOP, CPP  Hematology/Oncology Pharmacist  P: [phone]

## 2018-04-28 NOTE — Unmapped (Signed)
Met with Russell [name], 57 y.o., for treatment of tobacco use/dependence.    SUMMARY: Pt expresses ambivalence around becoming tobacco-free, stating that while he knows smoking isn't benefiting his health it helps him cope with stress. Pt had difficulty remaining awake during conversation and his mother and friend provided collateral information regarding his tobacco use. While pt normally smokes 1ppd, he has recently been smoking 5 or less per day due to constant drowsiness from the medications he is on. Pt's mother reports pt spends the majority of the day sleeping now. Pt denies cravings and feels the patch has been effective at managing withdrawal symptoms. SW elicited motivation and helped pt identify related triggers, strategies, and resources. SW provided pt with contact information, physical improvements related to tobacco cessation, and available resource (including outpt Tobacco Treatment Program at [facility] and [place] [facility]). Please see below for NRT recommendations in bold. Please send pt home with scripts for nicotine patch and gum at time of discharge.    Medications Recommended During Hospitalization: Patch 14mg   Outpatient/Discharge Medications Recommended: Patch 14mg , Gum 4mg     Tobacco Use Treatment  Program: Hospital Inpatient  Type of Visit: Initial  Tobacco Use Treatment Visit: Talked with patient, Talked with family member/other visitor  Goals Of Session: Insight, increase, Assessment, Relapse prevention skills training    Tobacco Use During Past 30 Days  Time Since Last Tobacco Use: 1 to 7 days ago  Tobacco Withdrawal (Past 24 Hours): None noted  Type of Tobacco Products Used: Cigarettes  Quantity Used: 20(Currently less than 5 per day)  Quantity Per: day  Other Household Members Use Tobacco: Prevalent in social network  Smoking Allowed in Home: Yes    Tobacco Use History  Brand of Tobacco Used: Marlboro  Menthol: Yes    Behavioral Assessment  Why Uses: 1. Stress-relief 2. Habit 3. Helps pass the time   Reasons to Become Tobacco Free: 1. Health 2. Save money  Barriers/Challenges: 1. Long-standing habit 2. Others smoking around him 3. Stress from his medical condition  Strategies: 1. In addition to using NRT, pt can use drinking straws, cinnamon-flavored toothpicks, and hard candy 2. Pt can make his home tobacco-free 3. Pt can engage in other activities he enjoys that help reduce stress    Treatment Plan  Cessation Meds Currently Using: Patch 14mg   Medications Recommended During Hospitalization: Patch 14mg   Outpatient/Discharge Medications Recommended: Patch 14mg , Gum 4mg   Plan to Obtain Outpatient Meds: TTS messaged providers for Rx  Patient's Plan Post Discharge/Visit: Undecided     TTS Information  Diagnosis: Tobacco use disorder, unspecified, uncomplicated (F17.200)  Interventions: Assessed, Discussed, Informed, Motivational interviewing, Suggested, Taught, Tx plan development, Psycho-education, Encouraged  TTS Visit Length: Psychotherapy >16 min       Russell Chu, LCSW, LCAS, CTTS  Clinical Social Worker / Tobacco Treatment Specialist  Tobacco Treatment Program  [facility]  phone: [phone]  pager: [phone]

## 2018-04-28 NOTE — Unmapped (Signed)
-----   Message from Daylene Posey, CPP sent at 04/28/2018  2:26 PM EDT -----  Regarding: RE: Ship meds  He will get the drugs thursday  ----- Message -----  From: Jennye Moccasin, MD  Sent: 04/28/2018   1:49 PM  To: Elijah Birk, RN, #  Subject: Ship meds                                        Florentina Addison - he is being discharged and looks okay apparently. I reviewed lenvatinib/evero paper again and very minimal myelosuppression so I think fine to ship/start both    Soraya - could you set him up to see me or Thermon Leyland next week?    Thanks  French Ana

## 2018-04-28 NOTE — Unmapped (Signed)
Pt stable overnight. Vital signs stable aside from HTN close to baseline. Continues on 2L [place]. Family member at the bedside overnight. Pt taken down for MRI approx 2000 but sent back to the unit because unable to tolerate lying still due to pain. Pt stated he was in extreme pain and previous regimen was not offering any relief. In contact frequently with MD who eventually came to discus with patient and home dose of oxycodone was ordered as well as one IV toradol and voltaren gel. Trazadone given for sleep with relief.

## 2018-04-28 NOTE — Unmapped (Signed)
OCCUPATIONAL THERAPY  Evaluation (04/28/18 1534)    Patient Name:  Russell [name]       Medical Record Number: [id number]   Date of Birth: 1961-07-18  Sex: Male          OT Treatment Diagnosis:  Decreased activity tolerance, and significant c/o LLE pain impacting safe ADL/IADL engagement     Assessment  Russell [name] is a 57 y.o. male who presented to [facility] with Cancer related pain. Russell [name] presents to acute OT with decreased activity tolerance, and significant RLE pain impacting safe ADL/IADL engagement. Pt politely requesting to defer EOB/OOB assessment 2/2 RLE pain despite encouragement and education from therapist. Pt demonstrated functional bed mobility this date. RN Fritzi Mandes updated and aware. Russell [name] would benefit from continued skilled acute OT to progress EOB/OOB ADL/IADL. Based on consideration of pt's occupational profile, assessment review, level of clinical decision making involved, and intervention plan, this pt is considered to be a moderate complexity case. Based on the daily activity AM-PAC raw score of 14/24, the pt is considered to be 59.67% impaired with self care. This indicates this pt would benefit from 5x/week, low intensity post-acute skilled OT services to maximize safe return to daily routines, life roles, and meaningful occupations. Pt with potential to progress to 3x/week pending progression of EOB/OOB activity.       Activity Tolerance During Today's Session  Patient limited by pain    Plan  Planned Frequency of Treatment:  1-2x per day for: 3-4x week    Planned Interventions:  Adaptive equipment;ADL retraining;Balance activities;Bed mobility;Compensatory tech. training;Conservation;Education - Patient;Home exercise program;Functional mobility;Functional cognition;Endurance activities;Education - Family / caregiver;Positioning;Range of motion;Safety education;Postular / Proximal stability;Neuromuscular re-education;Therapeutic exercise;Transfer training;UE Strength / coordination exercise;Visual / perceptual tasks    Post-Discharge Occupational Therapy Recommendations:  OT Post Acute Discharge Recommendations: 5x weekly;Low intensity(pt with potential to progress pending EOB/OOB assessment)     OT DME Recommendations: Defer to post acute(if pt d/c home, recommend 3in1 commode)    GOALS:   Patient and Family Goals: I just want some pain meds so I can get up and walk to the bathroom, and back.    Long Term Goal #1: Pt will score 24/24 on AMPAC in 6 weeks     Short Term:  Pt will complete EOB assessment in preparation for seated ADL    Time Frame : 2 weeks  Pt will complete OOB/transfer assessment in preparation for standing ADL    Time Frame : 2 weeks  Pt will complete bimanual grooming task at bed level with setup A    Time Frame : 2 weeks    Prognosis:  Good  Positive Indicators:  PLOF  Barriers to Discharge: Inability to safely perform ADLS;Pain;Endurance deficits;Functional strength deficits    Subjective  Current Status Pt received/left prone at bed level, all immediate needs met, call bell in reach, RN Kirsten aware   Prior Functional Status Prior to recent admission, Russell [name] reports independence with daily activities. He was not utilizing AD for functional mobility inside/outside of the home, and denies recent falls. He reports that he does not drive, so his roommate completes most of the grocery shopping.     Medical Tests / Procedures: MRI LLE 5/27: Limited study as patient was unable to tolerate exam. Extensive osseous metastatic disease including large expansile mass within the right iliac wing. MRI CTL spine 04/08/18: Extensive widespread osseous metastatic disease. No acute compression fracture. No canal stenosis. Unchanged nonacute C7 compression deformity.     Patient /  Caregiver reports: I just want some pain meds so I can get up and walk to the bathroom, and back.    Past Medical History:   Diagnosis Date   ??? Bone metastasis (CMS-HCC)    ??? Renal cancer (CMS-HCC)     Social History     Tobacco Use   ??? Smoking status: Current Every Day Smoker     Packs/day: 1.00     Types: Cigarettes   ??? Smokeless tobacco: Never Used   ??? Tobacco comment: Currently less than a quarter pack due to drowsiness from meds   Substance Use Topics   ??? Alcohol use: Yes      Past Surgical History:   Procedure Laterality Date   ??? FLEXIBLE BRONCHOSCOPY  07/24/2017   ??? HERNIA REPAIR      Family History   Problem Relation Age of Onset   ??? Brain cancer Paternal Uncle         Patient has no known allergies.     Objective Findings  Precautions / Restrictions  Falls precautions    Weight Bearing  Non-applicable    Required Braces or Orthoses  Non-applicable    Communication Preference  Verbal    Pain  pt reporting 7/10 pain at LLE, pt deferring EOB/OOB assessment 2/2 pain, activity adjusted per tolerance, RN Kirsten aware    Equipment / Environment  Vascular access (PIV, TLC, Port-a-cath, PICC);Supplemental oxygen(1L via [place])    Living Situation   Living environment: House   Lives With: Friend(s)(per pt roommate available to assist 24/7)   Home Living: One level home;Level entry;Tub/shower unit;Standard height toilet   Equipment available at home: [employer];Wheelchair-manual;Hospital bed    Cognition   Orientation Level:  Oriented x 4   Arousal/Alertness:  Appropriate responses to stimuli   Attention Span:  Appears intact   Memory:  Appears intact   Following Commands:  Follows one step commands with repetition;Follows one step commands with increased time   Safety Judgment:  Unable to assess   Awareness of Errors:  Unable to assess   Problem Solving:  Unable to assess   Comments: pt limited by pain, and fatigue with eyes closed during most of session     Vision / Perception  Vision: No visual deficits  Perception: appears grossly intact   Comments: pt denies acute visual changes     Hand Function  Hand Dominance: R  appeared grossly WFL at bed level     Skin Inspection  visible skin appeared intact     ROM / Strength/Coordination  UE ROM/ Strength/ Coordination: appeared grossly WFL at bed level   LE ROM/ Strength/ Coordination: appeared grossly WFL at bed level, but limited 2/2 pain     Sensation:  denied numbness/tingling at this time     Balance:  pt not agreeable to EOB/OOB assessment 2/2 pain     Mobility/Gait/Transfers: pt not agreeable to EOB/OOB assessment 2/2 pain     ADL:  Bathing: anticipate max A at bed level   Grooming: anticipate setup A at bed level   Dressing: anticipate max A   Eating: setup A at bed level   Toileting: anticipate max A at bed level     Vitals/ Orthostatics:  At Rest: SpO2: 91% on 1 L via [place]; HR: 114 at rest   With Activity: N/A  Orthostatics: N/A     Interventions Performed During Today's Session: AMPAC: 14/24.  Pt politely requesting to defer EOB/OOB assessment 2/2 RLE pain despite encouragement and  education from therapist. Pt demonstrated functional bed mobility this date. Pt provided skilled education re: importance of EOB/OOB activity to promote increased activity tolerance, pursed lip breathing strategies, minimizing excessive bending/lifting/twisting, role of acute OT, and POC.     Eval Duration (OT): 15 Min.    Medical Staff Made Aware: RN Kirsten     I attest that I have reviewed the above information.  Signed: Danielle Dess, OT  Filed 04/28/2018

## 2018-04-28 NOTE — Unmapped (Signed)
Palliative Care Consult Note    Consultation from Requesting Attending Physician:  Olivia Mackie, MD  Service Requesting Consult:  Oncology/Hematology (MDE)  Reason for Consult Request from Attending Physician:  Evaluation of Symptoms  Primary Care Provider:  [facility]    Code Status: Full  Advance Directive Status: No HCPOA or living will but has stated decision maker preferences as documented in chart.      Assessment/Recommendation:   SUMMARY: Russell [name] is an 57 y.o. 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 Also with ongoing substance use with multiple U tox positive for cocaine. He was admitted 3 days ago for AMS that cleared and was thought to be related to cocaine use. He left AMA but returned yesterday due to shortness of breath and anxiety.    1. Symptom Assessment and Recommendations:      # Cancer related pain: not the best historian but states right now pain is fairly well controlled, received oxycodone and methadone prior to visit. Discussed with patient that pain control is doable but ongoing cocaine use will make it difficult. He states he has decided that he will no longer use cocaine.   Reviewed outpatient notes and appears that his methadone dose was decreased by Dr. Legrand Como on 04/17/18 due to friend's concern that he was oversedated on methadone 15 mg TID. He last received Rx on 5/23 for 7 day supply.    -Continue methadone 10 mg PO TID  -continue oxycodone 30 mg q4hr prn  -Continue gabapentin 900 mg TID and tylenol TID  -I have sent InBasket message to [facility] coordinator Allegra Lai to see when he can be rescheduled for follow up with Dr. Legrand Como. Patient should have pain meds through tomorrow 5/29.    2. Communication and ACP:  -- Prognosis and Understanding: Patient tells me today that he was hoping to beat the cancer but now understands that it cannot be cured. Still hoping to control the disease and have more time.    -- Goals of Care and Decision Making: Not discussed  Decision Maker at Visit      3. Support / Other Communication and Counseling Topics:        Thank you for this consult. Please page  Darrin Nipper  or Palliative Care [phone]) if there are any questions.     Subjective:    History of Present Illness:      Russell [name] is an 57 y.o. M with metastatic RCC, substance disorder and worsening pain on methadone. He was seen on 04/25/18 for AMS and found to have a U Tox positive for cocaine. His mental status improved and he decided to leave AMA. Yesterday he called 911 due to shortness of breath and anxiety. This AM says he is doing okay. He feels that pain isn't too bad although intermittently has discomfort in left buttock/leg. This pain has been going on for months. It radiates down his leg and is a shooting pain. Also has pain across his midback. This pain is sharp and achey. He received oxycodone 30 mg which he thinks has been helpful. He taking his methadone dose now. He is unable to tell me what he has been taking at home. He denies shortness of breath, cough, nausea, vomiting, constipation or LE swelling.     Allergies:  No Known Allergies    Current Facility-Administered Medications   Medication Dose  Route Frequency Provider Last Rate Last Dose   ??? acetaminophen (TYLENOL) tablet 1,000 mg  1,000 mg Oral TID Fanny Dance, MD   1,000 mg at 04/28/18 0538   ??? albuterol (PROVENTIL HFA;VENTOLIN HFA) 90 mcg/actuation inhaler 2 puff  2 puff Inhalation Q6H PRN Fanny Dance, MD       ??? diclofenac sodium (VOLTAREN) 1 % gel 2 g  2 g Topical 4x Daily Clance Boll, MD   2 g at 04/28/18 0538   ??? enoxaparin (LOVENOX) syringe 40 mg  40 mg Subcutaneous Q24H SCH Fanny Dance, MD   40 mg at 04/28/18 0830   ??? gabapentin (NEURONTIN) capsule 900 mg  900 mg Oral TID Fanny Dance, MD   900 mg at 04/28/18 0830   ??? ibuprofen (ADVIL,MOTRIN) tablet 600 mg  600 mg Oral Q8H PRN Fanny Dance, MD   600 mg at 04/27/18 1610   ??? lidocaine (LIDODERM) 5 % patch 1 patch  1 patch Transdermal Q24H Fanny Dance, MD   1 patch at 04/27/18 1555   ??? methadone (DOLOPHINE) tablet 15 mg  15 mg Oral [facility] Sandi Raveling, MD   15 mg at 04/28/18 1020   ??? naloxone [facility]) injection 1 mg  1 mg Intravenous Once PRN Sandi Raveling, MD       ??? nicotine (NICODERM CQ) 14 mg/24 hr patch 1 patch  1 patch Transdermal Daily Sandi Raveling, MD   1 patch at 04/28/18 1020   ??? nicotine polacrilex (NICORETTE) gum 4 mg  4 mg Buccal Q1H PRN Sandi Raveling, MD       ??? ondansetron (ZOFRAN-ODT) disintegrating tablet 8 mg  8 mg Oral Q8H PRN Fanny Dance, MD       ??? oxyCODONE (ROXICODONE) immediate release tablet 30 mg  30 mg Oral Q4H PRN Clance Boll, MD   30 mg at 04/28/18 0932   ??? pantoprazole (PROTONIX) EC tablet 40 mg  40 mg Oral Daily Fanny Dance, MD   40 mg at 04/28/18 0830   ??? tiotropium (SPIRIVA) 18 mcg inhalation capsule 18 mcg  18 mcg Inhalation Daily (RT) Fanny Dance, MD   18 mcg at 04/28/18 5621       Past Medical History:   Diagnosis Date   ??? Bone metastasis (CMS-HCC)    ??? Renal cancer (CMS-HCC)        Past Surgical History:   Procedure Laterality Date   ??? FLEXIBLE BRONCHOSCOPY  07/24/2017   ??? HERNIA REPAIR         Social History and Social/Spiritual Support: He has been living with a friend.    Family History:   family history includes Brain cancer in his paternal uncle.    Review of Systems:  A 12 system review of systems was negative except as noted in HPI.    Objective:     Function:  60% - Ambulation: Reduced / unable to do hobby or some housework, significant disease / Self-Care:Occasional assist as necessary / Intake:Normal or reduced / Level of Conscious: Full or confusion    Temp:  [36.3 ??C (97.3 ??F)-37.7 ??C (99.9 ??F)] 37.2 ??C (99 ??F)  Heart Rate:  [106-119] 119  SpO2 Pulse:  [107-114] 114  Resp:  [20-25] 20  BP: (138-174)/(78-109) 147/84  FiO2 (%):  [2 %] 2 %  SpO2:  [90 %-99 %] 93 % No intake/output data recorded.    Physical Exam:  GEN: Middle-aged man, lying in bed,  NAD  EYES: anicteric  ENT: mmm   CARDIO: RRR, no heave, no edema  RESP: CTAB, normal work of breathing  GI: s/nt/nd, +BS   MSK: TTP mid-center back, no TTP in left buttock area or down leg, no other areas of tenderness along vertebral bodies   SKIN: warm and dry, no rash  NEURO: Alert and oriented to person and place, slow to answer questions at times      Test Results:  Lab Results   Component Value Date    WBC 4.5 04/28/2018    RBC 3.10 (L) 04/28/2018    HGB 8.4 (L) 04/28/2018    HCT 25.8 (L) 04/28/2018    MCV 83.4 04/28/2018    MCH 27.1 04/28/2018    MCHC 32.5 04/28/2018    RDW 17.7 (H) 04/28/2018    PLT 68 (L) 04/28/2018     Lab Results   Component Value Date    NA 136 04/28/2018    NA 136 04/28/2018    K 4.0 04/28/2018    K 4.0 04/28/2018    CL 103 04/28/2018    CL 103 04/28/2018    CO2 25.0 04/28/2018    CO2 25.0 04/28/2018    BUN 15 04/28/2018    BUN 15 04/28/2018    CREATININE 0.47 (L) 04/28/2018    CREATININE 0.47 (L) 04/28/2018    GLU 103 04/28/2018    GLU 103 04/28/2018    CALCIUM 8.0 (L) 04/28/2018    CALCIUM 8.0 (L) 04/28/2018    ALBUMIN 2.7 (L) 04/28/2018    ALBUMIN 2.7 (L) 04/28/2018    PHOS 2.1 (L) 04/28/2018      Lab Results   Component Value Date    ALKPHOS 206 (H) 04/28/2018    ALKPHOS 206 (H) 04/28/2018    BILITOT 0.8 04/28/2018    BILITOT 0.8 04/28/2018    BILIDIR 0.30 04/28/2018    PROT 5.5 (L) 04/28/2018    PROT 5.5 (L) 04/28/2018    ALBUMIN 2.7 (L) 04/28/2018    ALBUMIN 2.7 (L) 04/28/2018    ALT 34 04/28/2018    ALT 34 04/28/2018    AST 83 (H) 04/28/2018    AST 83 (H) 04/28/2018       Imaging: na      Total time spent with patient for evaluation & management: 45 Minutes  Start time - stop time:    Greater than 50% time spent on counseling/coordination of care:  No.   See ACP Note from today for additional billable service:  No.

## 2018-04-28 NOTE — Unmapped (Signed)
E2 Solid Oncology Daily Progress Note    Interval events:  Restarted home pain regimen.  Palliative Care met with patient.  Weaned supplemental oxygen to room air.     Assessment/Plan:    Principal Problem:    Cancer related pain  Active Problems:    Metastatic renal cell carcinoma to bone (CMS-HCC)    Shortness of breath  Resolved Problems:    * No resolved hospital problems. *        Pressure Ulcer(s)    Active Pressure Ulcer     None                 Russell [name] is a 57 y.o. male who presented to [facility] with Cancer related pain.    Shortness of breath  Was on 2L [place] again overnight.  -Will attempt to wean to room air with goal sat > 88%    Chronic pain, cancer-related pain  - continue methadone 10 mg TID  - continue Oxycodone 30 mg Q4hrs prn   - continue 1 week opioid scripts - no early refills  - will work for close interval followup    Metastatic RCC  - palliative care consulted  - pain regimen as above    Substance abuse  UDS clean of cocaine on admission; positive for opiates and methadone  -CTM  -Encourage cessation  ??  Hypertension:??Low-normal blood pressures and potential for hypotension with a number of the above-mentioned differential diagnoses.  - Hold amlodipine  ??  Tobacco use:  - Nicotine patch??14mg /24hr  - Nicotine gum PRN  ??  FEN/GI/PPx:  Diet -??regular  IVF -??none  Replete electrolytes PRN  GI -??pantroprazole??20mg  daily  VTE -??enoxaparin 40mg  daily  ??  Code status:??Full - no living will or ACP note per??Dr.??Winzelberg's note??on 5/22  Decision makers: Russell [name] and Russell [name]  Dispo:??solid oncology service, floor status  Access:??PIV x 1      ___________________________________________________________________    Subjective:  No acute events overnight, Patient states pain well controlled, Feels that breathing is improved    Labs/Studies:  Labs and Studies from the last 24hrs per EMR and Reviewed    Objective:  Temp:  [36.3 ??C-37.7 ??C] 36.9 ??C  Heart Rate:  [106-119] 113  Resp:  [18-25] 18  BP: (136-174)/(78-109) 136/88  FiO2 (%):  [2 %] 2 %  SpO2:  [90 %-99 %] 92 %    General: chronically ill appearing man  HEENT: MMM, [place] in place  CV: tachycardic but regular rhythm, no m/r/g  Pulm: coarse lung sounds diffusely; normal wob on room air  Abd: soft, nt, nd  Skin: WWP       I saw and evaluated the patient, participating in the key portions of the service.  I reviewed the resident???s note.  I agree with the resident???s findings and plan. Russell [name] appears comfortable this morning with no acute symptoms. I have spoken with his oncologist Dr. Okey Dupre who is arranging shipment of his next line of systemic therapy for RCC. We will restart his home methadone, assure no hypoxia on RA and then arrange for closer follow-up with Dr. Okey Dupre.  Russell Nigh, MD

## 2018-04-28 NOTE — Unmapped (Signed)
Pt rescheduled as requested, do I need to contact him?    Thanks,  [employer]

## 2018-04-28 NOTE — Unmapped (Signed)
Care Management  Initial Transition Planning Assessment        Per notes pt just d/c AMA and returned with increasing pain.  Per team palliative care to meet with pt for GOC.  CM will meet with pt after palliative care for d/c needs.               General  Care Manager assessed the patient by : Medical record review, Discussion with Clinical Care team  Orientation Level: Oriented X4    Contact/Decision Maker        Extended Emergency Contact Information  Primary Emergency Contact: Fredrik Rigger [place]  Home Phone: [phone]  Relation: Friend  Secondary Emergency Contact: [name]  Mobile Phone: [phone]  Relation: Friend    Legal Next of Kin / Guardian / POA / Advance Directives     HCDM (patient stated preference): Nikki Dom [phone]    HCDM (patient stated preference): [name] - Friend - [phone]    Advance Directive (Medical Treatment)  Does patient have an advance directive covering medical treatment?: Patient does not have advance directive covering medical treatment., Patient would not like information.  Reason patient does not have an advance directive covering medical treatment:: Patient does not wish to complete one at this time  Information provided on advance directive:: No  Patient requests assistance:: No         Patient Information  Lives with: Friends    Type of Residence:          Location/Detail: [place], [place]    Support Systems: Friends/Neighbors    Responsibilities/Dependents at home?: No    Home Care services in place prior to admission?: No                  Equipment Currently Used at Home: walker, rolling, hospital bed, wheelchair, manual       Currently receiving outpatient dialysis?: No       Financial Information       Need for financial assistance?: No         Discharge Needs Assessment  Concerns to be Addressed: adjustment to diagnosis/illness, care coordination/care conferences    Clinical Risk Factors: Principal Diagnosis: Cancer, Stroke, COPD, Heart Failure, AMI, Pneumonia, Joint Replacment    Barriers to taking medications: No    Prior overnight hospital stay or ED visit in last 90 days: Yes    Readmission Within the Last 30 Days: current reason for admission unrelated to previous admission                   Discharge Facility/Level of Care Needs:      Readmission  Risk of Unplanned Readmission Score: UNPLANNED READMISSION SCORE: 36%  Readmitted Within the Last 30 Days? Yes  Patient at risk for readmission?: Yes    Discharge Plan  Screen findings are: Discharge planning needs identified or anticipated (Comment).    Expected Discharge Date: 05/01/18    Expected Transfer from Critical Care:      Patient and/or family were provided with choice of facilities / services that are available and appropriate to meet post hospital care needs?: N/A       Initial Assessment complete?: Yes

## 2018-04-29 DIAGNOSIS — D61818 Other pancytopenia: Secondary | ICD-10-CM

## 2018-04-29 DIAGNOSIS — C7951 Secondary malignant neoplasm of bone: Principal | ICD-10-CM

## 2018-04-29 DIAGNOSIS — Z7952 Long term (current) use of systemic steroids: Secondary | ICD-10-CM

## 2018-04-29 DIAGNOSIS — F17210 Nicotine dependence, cigarettes, uncomplicated: Secondary | ICD-10-CM

## 2018-04-29 DIAGNOSIS — C779 Secondary and unspecified malignant neoplasm of lymph node, unspecified: Secondary | ICD-10-CM

## 2018-04-29 DIAGNOSIS — J449 Chronic obstructive pulmonary disease, unspecified: Secondary | ICD-10-CM

## 2018-04-29 DIAGNOSIS — C7801 Secondary malignant neoplasm of right lung: Secondary | ICD-10-CM

## 2018-04-29 DIAGNOSIS — C649 Malignant neoplasm of unspecified kidney, except renal pelvis: Secondary | ICD-10-CM

## 2018-04-29 DIAGNOSIS — G893 Neoplasm related pain (acute) (chronic): Secondary | ICD-10-CM

## 2018-04-29 DIAGNOSIS — F1410 Cocaine abuse, uncomplicated: Secondary | ICD-10-CM

## 2018-04-29 DIAGNOSIS — Z9981 Dependence on supplemental oxygen: Secondary | ICD-10-CM

## 2018-04-29 LAB — URINALYSIS
BACTERIA: NONE SEEN /HPF
BILIRUBIN UA: NEGATIVE
BLOOD UA: NEGATIVE
GLUCOSE UA: NEGATIVE
KETONES UA: 20 — AB
LEUKOCYTE ESTERASE UA: NEGATIVE
NITRITE UA: NEGATIVE
PH UA: 6.5 (ref 5.0–9.0)
PROTEIN UA: 30 — AB
RBC UA: 7 /HPF — ABNORMAL HIGH (ref <=3)
SPECIFIC GRAVITY UA: 1.025 (ref 1.003–1.030)
SQUAMOUS EPITHELIAL: <1 /HPF (ref 0–5)
UROBILINOGEN UA: >=8 — AB
WBC UA: 1 /HPF (ref <=2)

## 2018-04-29 LAB — CBC W/ AUTO DIFF
BASOPHILS ABSOLUTE COUNT: 0 10*9/L (ref 0.0–0.1)
BASOPHILS RELATIVE PERCENT: 0.8 %
EOSINOPHILS ABSOLUTE COUNT: 0 10*9/L (ref 0.0–0.4)
HEMATOCRIT: 23.9 % — ABNORMAL LOW (ref 41.0–53.0)
HEMOGLOBIN: 7.6 g/dL — ABNORMAL LOW (ref 13.5–17.5)
LARGE UNSTAINED CELLS: 4 % (ref 0–4)
LYMPHOCYTES ABSOLUTE COUNT: 0.8 10*9/L — ABNORMAL LOW (ref 1.5–5.0)
LYMPHOCYTES RELATIVE PERCENT: 21.2 %
MEAN CORPUSCULAR HEMOGLOBIN CONC: 31.7 g/dL (ref 31.0–37.0)
MEAN CORPUSCULAR HEMOGLOBIN: 26.1 pg (ref 26.0–34.0)
MEAN PLATELET VOLUME: 8.9 fL (ref 7.0–10.0)
MONOCYTES ABSOLUTE COUNT: 0.2 10*9/L (ref 0.2–0.8)
MONOCYTES RELATIVE PERCENT: 6 %
NEUTROPHILS ABSOLUTE COUNT: 2.7 10*9/L (ref 2.0–7.5)
NEUTROPHILS RELATIVE PERCENT: 68.1 %
NUCLEATED RED BLOOD CELLS: 11 /100{WBCs} — ABNORMAL HIGH (ref <=4)
PLATELET COUNT: 69 10*9/L — ABNORMAL LOW (ref 150–440)
RED BLOOD CELL COUNT: 2.91 10*12/L — ABNORMAL LOW (ref 4.50–5.90)
RED CELL DISTRIBUTION WIDTH: 18.1 % — ABNORMAL HIGH (ref 12.0–15.0)
WBC ADJUSTED: 3.9 10*9/L — ABNORMAL LOW (ref 4.5–11.0)

## 2018-04-29 LAB — BASIC METABOLIC PANEL
ANION GAP: 8 mmol/L — ABNORMAL LOW (ref 9–15)
BLOOD UREA NITROGEN: 15 mg/dL (ref 7–21)
BUN / CREAT RATIO: 29
CALCIUM: 7.8 mg/dL — ABNORMAL LOW (ref 8.5–10.2)
CHLORIDE: 103 mmol/L (ref 98–107)
CO2: 24 mmol/L (ref 22.0–30.0)
CREATININE: 0.51 mg/dL — ABNORMAL LOW (ref 0.70–1.30)
EGFR MDRD NON AF AMER: >=60 mL/min/{1.73_m2} (ref >=60)
GLUCOSE RANDOM: 85 mg/dL (ref 65–179)
POTASSIUM: 4 mmol/L (ref 3.5–5.0)
SODIUM: 135 mmol/L (ref 135–145)

## 2018-04-29 LAB — BILIRUBIN DIRECT: Bilirubin.glucuronidated:MCnc:Pt:Ser/Plas:Qn:: 0.2

## 2018-04-29 LAB — POLYCHROMASIA

## 2018-04-29 LAB — HEPATIC FUNCTION PANEL
ALBUMIN: 2.3 g/dL — ABNORMAL LOW (ref 3.5–5.0)
ALT (SGPT): 29 U/L (ref 19–72)
BILIRUBIN TOTAL: 0.6 mg/dL (ref 0.0–1.2)
PROTEIN TOTAL: 4.7 g/dL — ABNORMAL LOW (ref 6.5–8.3)

## 2018-04-29 LAB — PHOSPHORUS: Phosphate:MCnc:Pt:Ser/Plas:Qn:: 2.8 — ABNORMAL LOW

## 2018-04-29 LAB — EGFR MDRD AF AMER: Glomerular filtration rate/1.73 sq M.predicted.black:ArVRat:Pt:Ser/Plas/Bld:Qn:Creatinine-based formula (MDRD): >=60

## 2018-04-29 LAB — COLOR

## 2018-04-29 LAB — SLIDE REVIEW

## 2018-04-29 LAB — MAGNESIUM: Magnesium:MCnc:Pt:Ser/Plas:Qn:: 2

## 2018-04-29 LAB — WBC ADJUSTED: Lab: 3.9 — ABNORMAL LOW

## 2018-04-29 LAB — FUNGITELL: Lab: <31

## 2018-04-29 MED ORDER — AMOXICILLIN 875 MG-POTASSIUM CLAVULANATE 125 MG TABLET
ORAL_TABLET | Freq: Two times a day (BID) | ORAL | 0 refills | 0.00000 days | Status: CP
Start: 2018-04-29 — End: 2018-05-09

## 2018-04-29 MED ORDER — DICLOFENAC 1 % TOPICAL GEL
Freq: Four times a day (QID) | TOPICAL | 0 refills | 0.00000 days | Status: CP
Start: 2018-04-29 — End: 2019-04-29

## 2018-04-29 NOTE — Unmapped (Addendum)
[facility] at [phone].                Referral accepted for delivery of Oxygen to patient prior to discharge    [facility] Intake at [phone].   Referral accepted for home health 05/07/18 first available will see earlier if able.

## 2018-04-29 NOTE — Unmapped (Signed)
Pt febrile overnight at 38.2 and tachy to 120. MD aware, workup ordered. Chest x ray performed. Waiting for urine sample. Pt used oxycodone for PRN pain. Continues to need O2, bumped to 2L when febrile. Scheduled tylenol and voltaren also used.

## 2018-04-29 NOTE — Unmapped (Signed)
Hi,     Sussie, with [facility], contacted the Communication Center regarding the following:    To confirm with Dr. Dixie Dials that she will follow the patient in Hospice. The patient is being discharged today.    Please feel free to contact Sussie at [phone].    Thanks in advance,    Drema Balzarine  [facility]   [phone]

## 2018-04-29 NOTE — Unmapped (Signed)
Physician Discharge Summary    Admit date: 04/27/2018    Discharge date and time: 04/29/2018    Discharge to: home    Discharge Service: Oncology/Hematology (MDE)    Discharge Attending Physician: Danae Orleans, MD    Discharge Diagnoses:  Metastatic RCC  Shortness of breath - improved  Chronic pain - stable    Procedures:   none    Pertinent Test Results:  Lab Results   Component Value Date    WBC 3.9 (L) 04/29/2018    HGB 7.6 (L) 04/29/2018    HCT 23.9 (L) 04/29/2018    PLT 69 (L) 04/29/2018       Lab Results   Component Value Date    NA 135 04/29/2018    K 4.0 04/29/2018    CL 103 04/29/2018    CO2 24.0 04/29/2018    BUN 15 04/29/2018    CREATININE 0.51 (L) 04/29/2018    GLU 85 04/29/2018    CALCIUM 7.8 (L) 04/29/2018    MG 2.0 04/29/2018    PHOS 2.8 (L) 04/29/2018       Lab Results   Component Value Date    BILITOT 0.6 04/29/2018    BILIDIR 0.20 04/29/2018    PROT 4.7 (L) 04/29/2018    ALBUMIN 2.3 (L) 04/29/2018    ALT 29 04/29/2018    AST 91 (H) 04/29/2018    ALKPHOS 191 (H) 04/29/2018       Lab Results   Component Value Date    INR 1.32 04/24/2018    APTT 30.3 04/24/2018         Hospital Course:  Liev Brockbank Sick??is a 57 y.o.??male??with history of polysubstance abuse, COPD and metastatic RCC no longer on chemotherapy, with poorly-controlled malignant pain requiring recent hospitalization for pain management and use of moderate-high doses of corticosteroids who??presented to [facility] with shortness of breath, anxiety, and desire to discuss cancer treatment vs palliation options.    Concern for postobstructive pneumonia  Febrile overnight 5/28. Started on Augmentin for 5 day course to be completed as outpatient.    Shortness of breath  Most likely this is related to the progression of his cancer affecting his lungs. Patient has been saturating well on 2L via [place] so we have set him up with home oxygen supplies.    Metastatic RCC  Complicated by diffuse metastases with resultant, difficult-to-control pain.??He has progressed on first and second line chemotherapy, so initiation of??everolimus and lenvatinib??was discussed in clinic; these medications will be sent to his home. He has follow-up next week with Ardean Larsen in oncology clinic.     Chronic pain  Follows with Dr. Legrand Como in cancer pain clinic. Continued home regimen:  -methadone 10 mg TID  -Oxycodone??30??mg Q4hrs prn??  -tylenol prn  -gabapentin     Added voltaren gel for joint and back pain.    Dr. Legrand Como is pain medicine prescriber for patient. In accord with pain plan and contract, continue 1 week opioid scripts with no early refills. Since patient already has this week's supply we will not be giving more methadone or oxycodone prescriptions on discharge.    Patient has followup appointment on 5.31.2019 in pain clinic.    Substance abuse  UDS was negative for cocaine on admission; UDS was positive for opiates and methadone as expected based on patient's known pain regimen. Encouraged cessation of cocaine and other illicit substances and explained the potential for adverse interactions with chemotherapy regimen.    Tobacco abuse  Encouraged smoking cessation.  Seen by smoking cessation counselor and discharged with nicotine patch and gum.             Condition at Discharge: stable  Discharge Medications:      Your Medication List      STOP taking these medications    lidocaine-diphenhydramine-aluminum-magnesium 200-25-400-40 mg/30 mL Mwsh  Commonly known as:  FIRST-MOUTHWASH BLM        START taking these medications    diclofenac sodium 1 % gel  Commonly known as:  VOLTAREN  Apply 2 g topically Four (4) times a day.        CONTINUE taking these medications    acetaminophen 500 MG tablet  Commonly known as:  TYLENOL  Take 2 tablets (1,000 mg total) by mouth three (3) times a day (at 6am, noon and 6pm).     albuterol 90 mcg/actuation inhaler  Commonly known as:  PROVENTIL HFA;VENTOLIN HFA  Inhale 2 puffs.     amLODIPine 5 MG tablet  Commonly known as:  NORVASC Take 1 tablet (5 mg total) by mouth daily.     dexamethasone 0.5 mg/5 mL solution  Commonly known as:  DECADRON  Take 10 mL (1 mg total) by mouth Four (4) times a day. Swish and spit.     everolimus (antineoplastic) 5 mg tablet  Commonly known as:  AFINITOR  Take 1 tablet (5 mg total) by mouth daily.     gabapentin 300 MG capsule  Commonly known as:  NEURONTIN  Take 3 capsules (900 mg total) by mouth Three (3) times a day.     ibuprofen 600 MG tablet  Commonly known as:  ADVIL,MOTRIN  Take 1 tablet (600 mg total) by mouth every eight (8) hours as needed for pain.     lenvatinib 18 mg/day (10 mg x 1-4 mg x2) Cap  Take 18 mg by mouth daily.     lidocaine 5 % patch  Commonly known as:  LIDODERM  Place 1 patch on the skin daily. Apply to affected area for 12 hours only each day (then remove patch)     methadone 10 MG tablet  Commonly known as:  DOLOPHINE  Take 1 tablet (10 mg total) by mouth every eight (8) hours. Fill on or after 04/23/18     naloxone nasal spray  Commonly known as:  NARCAN  One spray in either nostril once for known/suspected opioid overdose. May repeat every 2-3 minutes in alternating nostril til EMS arrives     nicotine 14 mg/24 hr patch  Commonly known as:  NICODERM CQ  Place 1 patch on the skin daily.     nicotine polacrilex 2 mg gum  Commonly known as:  NICORETTE  Apply 1 each (2 mg total) to cheek every hour as needed for smoking cessation.     omeprazole 20 MG capsule  Commonly known as:  PriLOSEC  Take 20 mg by mouth daily. TAKE 1 CAPSULE (20 MG TOTAL) BY MOUTH DAILY.     ondansetron 4 MG disintegrating tablet  Commonly known as:  ZOFRAN-ODT  Take 2 tablets (8 mg total) by mouth every eight (8) hours as needed for nausea.     oxyCODONE 30 MG immediate release tablet  Commonly known as:  ROXICODONE  Take 1 tablet (30 mg total) by mouth every four (4) hours as needed for pain. Fill on or after 04/23/18     predniSONE 5 MG tablet  Commonly known as:  DELTASONE  30 mg 5/17, 15 mg x3 days,  10 mg x3 days, 5mg  x3 days,     SPIRIVA WITH HANDIHALER 18 mcg inhalation capsule  Generic drug:  tiotropium  Place 18 mcg into inhaler and inhale.     urea 40 % cream  Commonly known as:  CARMOL  Apply 1 application topically Two (2) times a day. To hands and feet.            Pending Test Results:      Order Current Status    Blood Culture, Adult In process    Blood Culture, Adult In process    Opiate Confirmation, Urine In process    Urine Culture In process          Discharge Instructions:   Activity Instructions     Activity as tolerated          Diet Instructions     Discharge diet (specify)      Discharge Nutrition Therapy:  General          Follow Up instructions and Outpatient Referrals     Ambulatory referral to Home Health      Disciplines requested:   Nursing  Physical Therapy  Occupational Therapy       Nursing requested:  Other: (please enter in comments)    Physical Therapy requested:  Home safety evaluation    Occupational Therapy Requested:  Home safety evaluation    Physician to follow patient's care:  Referring Provider    Requested start of care date:  Routine (within 48 hours)    Do you want ongoing co-management?:  No    Care coordination required?:  No    I certify that JAYVYN HASELTON is confined to his/her home and needs intermittent skilled nursing care, physical therapy and/or speech therapy or continues to need occupational therapy. The patient is under my care, and I have authorized services on this plan of care and will periodically review the plan. The patient had a face-to-face encounter with an allowed provider type on (date) 04/29/2018 and the encounter was related to the primary reason for home health care.         Call MD for:  extreme fatigue      Call MD for:  persistent nausea or vomiting      Call MD for:  severe uncontrolled pain      Call MD for: Temperature > 38.5 Celsius ( > 101.3 Fahrenheit)      Discharge instructions      You were hospitalized here at [facility] for shortness of breath. We are sending you home with oxygen. We are also sending you home with Augmentin; please take this antibiotic as directed.    You have an appointment Friday morning with Dr. Legrand Como. It is important that you keep this appointment.             Appointments which have been scheduled for you    May 01, 2018  8:30 AM EDT  (Arrive by 8:00 AM)  RETURN PALLIATIVE CARE with Sonny Masters, MD  [facility] ONCOLOGY MULTIDISCIPLINARY 2ND FLR CANCER HOSP [facility] REGION) [address]  [place] [address]  [phone]      May 06, 2018 11:45 AM EDT  (Arrive by 11:15 AM)  LAB ONLY [place] with ADULT ONC LAB  [facility] ADULT ONCOLOGY LAB DRAW STATION [place] [facility] REGION) [address]  [place] [address]  [phone]      May 06, 2018 12:30 PM EDT  (  Arrive by 12:00 PM)  RETURN ACTIVE [place] with Kennieth Francois, FNP  [facility] SURGERY ONCOLOGY [facility] REGION) [address]  [place] [address]  [phone]            Resources and Referrals     Oxygen      Oxygen Type:  Continuous    % saturation on room air at rest:  88    Provide Continous Oxygen at (LPM):  2    Oxygen device delivery method:  Nasal Cannula    Type of portable O2 tank and concentrator to deliver:  Gaseous    Other Orders:  Optional Systems for Oxygen delivery    Optional System for Delivery:  Eval for Oxygen Conserving Device System    Provide:  Lightweight portable tank, carry bag, appropriate conserving device    Maintain SaO2 (greater than or = to)%:  90    Length of Need:  99 months          I spent greater than 30 minutes in the discharge of this patient.    Fanny Dance, MD    I personally saw, examined, and evaluated the patient, participating in the key portions of the service. I have reviewed the resident's note and agree with the documented findings and plan. The note reflects my participation and input in the interval history, physical exam, and complex medical decision making. It was medically necessary for me to see the patient due to complex medical issues in the setting of metastatic RCC, hypoxia and fever.      I personally spent 20 minutes face to face on the day of discharge. The total time in discharge was >30  minutes

## 2018-04-29 NOTE — Unmapped (Addendum)
Russell [name] a 57 y.o.??male??with history of polysubstance abuse, COPD and metastatic RCC no longer on chemotherapy, with poorly-controlled malignant pain requiring recent hospitalization for pain management and use of moderate-high doses of corticosteroids who??presented to [facility] with shortness of breath, anxiety, and desire to discuss cancer treatment vs palliation options.    Concern for postobstructive pneumonia  Febrile overnight 5/28. Started on Augmentin for 5 day course to be completed as outpatient.    Shortness of breath  Most likely this is related to the progression of his cancer affecting his lungs. Patient has been saturating well on 2L via [place] so we have set him up with home oxygen supplies.    Metastatic RCC  Complicated by diffuse metastases with resultant, difficult-to-control pain.??He has progressed on first and second line chemotherapy, so initiation of??everolimus and lenvatinib??was discussed in clinic; these medications will be sent to his home. He has follow-up next week with Russell [name] in oncology clinic.     Chronic pain  Follows with Dr. Legrand [name] in cancer pain clinic. Continued home regimen:  -methadone 10 mg TID  -Oxycodone??30??mg Q4hrs prn??  -tylenol prn  -gabapentin     Added voltaren gel for joint and back pain.    Dr. Legrand [name] is pain medicine prescriber for patient. In accord with pain plan and contract, continue 1 week opioid scripts with no early refills. Since patient already has this week's supply we will not be giving more methadone or oxycodone prescriptions on discharge.    Patient has followup appointment on 5.31.2019 in pain clinic.    Substance abuse  UDS was negative for cocaine on admission; UDS was positive for opiates and methadone as expected based on patient's known pain regimen. Encouraged cessation of cocaine and other illicit substances and explained the potential for adverse interactions with chemotherapy regimen.    Tobacco abuse  Encouraged smoking cessation. Seen by smoking cessation counselor and discharged with nicotine patch and gum.

## 2018-04-29 NOTE — Unmapped (Signed)
Pt A&O. VSS. Pt remains afebrile and free from falls/injuries. 30mg  oxycodone given x2 effective for pain management. Lidocaine patch and voltaren applied for pain management. Able to wean pt off of O2. Plan is for pt to be discharged tomorrow. WCTM.

## 2018-04-29 NOTE — Unmapped (Signed)
PHYSICAL THERAPY  Evaluation (04/29/18 0945)     Patient Name:  Russell [name]       Medical Record Number: [id number]   Date of Birth: 06-28-1961  Sex: Male            Treatment Diagnosis: Impaired functional mobility 2/2 pain     ASSESSMENT    Russell [name] is a 57 y.o. male with history of polysubstance abuse, COPD and metastatic RCC no longer on chemotherapy, with poorly-controlled malignant pain requiring recent hospitalization for pain management and use of moderate-high doses of corticosteroids who presented to [facility] with shortness of breath, anxiety, and desire to discuss cancer treatment vs palliation options. Pt able to move in bed with independence but unable to transition OOB due to severe back and L hip/pelvis pain. B/l LE strength appears WFL. Based on the AM-PAC 6 item raw score of 9, the patient is considered to be 77.59% impaired with basic mobility. Anticipate he will benefit from 5x wkly low intensity rehab post d/c though his mobility may quickly progress with pain management. Acute PT to f/u.       Today's Interventions: [facility] 9/24. Pt educated re: role of PT, PT POC, benefits progressive mobility, importance OOB mobility, log roll technique.     Activity Tolerance: Patient limited by pain    PLAN  Planned Frequency of Treatment:  1-2x per day for: 2-3x week      Planned Interventions: Balance activities;Education - Patient;Education - Family / caregiver;Endurance activities;Gait training;Home exercise program;Self-care / Home training;Stair training;Therapeutic exercise;Therapeutic activity;Transfer training    Post-Discharge Physical Therapy Recommendations:  5x weekly;Low intensity        PT DME Recommendations: Defer to post acute     Goals:   Patient and Family Goals: to return home             SHORT GOAL #1: Pt will complete EOB/OOB mobility assessment               Time Frame : 1 week                         Prognosis:  Fair  Barriers to Discharge: Pain  Positive Indicators: Age SUBJECTIVE  Patient reports: Pt agreeable to participate. Reports frustration that pain has limited his ability to get out of bed.   Current Functional Status: RN cleared pt for activity. Pt received and left supine in bed with NAD, call bell within reach, and all lines intact at end of session/      Prior functional status: Pt reports PTA he was independent with all mobility without device.   Equipment available at home: [employer];Wheelchair-manual;Hospital bed    Past Medical History:   Diagnosis Date   ??? Bone metastasis (CMS-HCC)    ??? Renal cancer (CMS-HCC)     Social History     Tobacco Use   ??? Smoking status: Current Every Day Smoker     Packs/day: 1.00     Types: Cigarettes   ??? Smokeless tobacco: Never Used   ??? Tobacco comment: Currently less than a quarter pack due to drowsiness from meds   Substance Use Topics   ??? Alcohol use: Yes      Past Surgical History:   Procedure Laterality Date   ??? FLEXIBLE BRONCHOSCOPY  07/24/2017   ??? HERNIA REPAIR      Family History   Problem Relation Age of Onset   ??? Brain cancer Paternal Uncle  Allergies: Patient has no known allergies.                Objective Findings              Precautions: Falls              Weight Bearing Status: Non- applicable              Required Braces or Orthoses: Non- applicable    Communication Preference: Verbal  Pain Comments: Pt with c/o 10/10 back/L hip pain even with pre-medication by RN.      Equipment / Environment: Vascular access (PIV, TLC, Port-a-cath, PICC)                  Living environment: House  Lives With: Mother(will be d/c'ing to mother's home )  Home Living: One level home;Stairs to enter with rails        Number of Stairs: 4    Cognition: Alert.      Skin Inspection: visible skin c/d/i     UE ROM: WFL  UE Strength: WFL  LE ROM: WFL  LE Strength: Able to perform b/l SLR, heel slides, ankle pumps                       Sensation: Denies changes in sensation or numbness/tingling             Bed Mobility: Pt rolled R/L with independence. Unable to transition to EOB due to severe pain   Transfers: Unable to attempt due to severe pain   Gait: Unable to attempt due to severe pain   Stairs: Unable to attempt due to severe pain            Eval Duration(PT): 15 Min.          I attest that I have reviewed the above information.  Signed: Hilaria Ota, PT  Filed 04/29/2018     *Evaluation started 04/29/18 at 945 AM when consult still active. Unable to link order at this time due to patient discharging.

## 2018-04-29 NOTE — Unmapped (Signed)
Evaluated patient's oxygen needs at bedside. Took off 2L [place] and patient desaturated to 87% at rest. Rechecked after 15 minutes and oxygen saturation still 88%. Placed back on 2L and patient's oxygen saturations improved to 95%.

## 2018-04-30 ENCOUNTER — Encounter: Admit: 2018-04-30 | Discharge: 2018-05-05 | Payer: MEDICAID

## 2018-04-30 ENCOUNTER — Inpatient Hospital Stay: Admit: 2018-04-30 | Discharge: 2018-05-05 | Payer: MEDICAID

## 2018-04-30 DIAGNOSIS — J449 Chronic obstructive pulmonary disease, unspecified: Secondary | ICD-10-CM

## 2018-04-30 DIAGNOSIS — Z9981 Dependence on supplemental oxygen: Secondary | ICD-10-CM

## 2018-04-30 DIAGNOSIS — C779 Secondary and unspecified malignant neoplasm of lymph node, unspecified: Secondary | ICD-10-CM

## 2018-04-30 DIAGNOSIS — G893 Neoplasm related pain (acute) (chronic): Secondary | ICD-10-CM

## 2018-04-30 DIAGNOSIS — F17210 Nicotine dependence, cigarettes, uncomplicated: Secondary | ICD-10-CM

## 2018-04-30 DIAGNOSIS — C7801 Secondary malignant neoplasm of right lung: Secondary | ICD-10-CM

## 2018-04-30 DIAGNOSIS — F1410 Cocaine abuse, uncomplicated: Secondary | ICD-10-CM

## 2018-04-30 DIAGNOSIS — C649 Malignant neoplasm of unspecified kidney, except renal pelvis: Secondary | ICD-10-CM

## 2018-04-30 DIAGNOSIS — C7951 Secondary malignant neoplasm of bone: Principal | ICD-10-CM

## 2018-04-30 DIAGNOSIS — D61818 Other pancytopenia: Secondary | ICD-10-CM

## 2018-04-30 DIAGNOSIS — Z7952 Long term (current) use of systemic steroids: Secondary | ICD-10-CM

## 2018-04-30 LAB — OPIATE, URINE, QUANTITATIVE
6-MONOACETYLMRPH: <20 ng/mL
CODEINE GC/MS CONF: <50 ng/mL
HYDROCODONE GC/MS CONF: <50 ng/mL
HYDROMORPHONE GC/MS CONF: <50 ng/mL
MORPHINE GC/MS CONF: <50 ng/mL
NORBUPRENORPHINE: <5 ng/mL
OPIATE INTERP: POSITIVE

## 2018-04-30 LAB — OXYCODONE (GC/MS): Lab: 3026

## 2018-04-30 NOTE — Unmapped (Signed)
AOC Triage Note     Patient: Russell [name]     Reason for call: Return call    Time call returned: 0918     Phone Assessment: Suzie wanted to confirm who would be following the patient for Home Health. Dr. Dixie Dials declined, stated that his primary oncologist should be following him. His primary oncologist is Dr. Okey Dupre.     Triage Recommendations: None     Patient Response: N/A     Outstanding tasks: Russell [name] was sent home with home health, will you be able to follow the patient and send orders for him Dr. Okey Dupre?    Patient Pharmacy has been verified and primary pharmacy has been marked as preferred

## 2018-04-30 NOTE — Unmapped (Signed)
Called pt's friend regarding her question if pt ok to take off his oxygen only for a short time. Home care nurse came in to see pt and check his O2 sat on RA was 88% per [name]. Encouraged [name] to keep O2 on but if pt insists on taking it off he can only for a very short time. Friend thankful for call and will call for any questions.

## 2018-04-30 NOTE — Unmapped (Signed)
Called pt's friend [name] regarding her [employer]. No answer so left voicemail with callback number.

## 2018-05-01 ENCOUNTER — Ambulatory Visit
Admit: 2018-05-01 | Discharge: 2018-05-01 | Payer: MEDICAID | Attending: Geriatric Medicine | Primary: Geriatric Medicine

## 2018-05-01 DIAGNOSIS — C7951 Secondary malignant neoplasm of bone: Secondary | ICD-10-CM

## 2018-05-01 DIAGNOSIS — C649 Malignant neoplasm of unspecified kidney, except renal pelvis: Secondary | ICD-10-CM

## 2018-05-01 DIAGNOSIS — G893 Neoplasm related pain (acute) (chronic): Principal | ICD-10-CM

## 2018-05-01 DIAGNOSIS — F1490 Cocaine use, unspecified, uncomplicated: Secondary | ICD-10-CM

## 2018-05-01 DIAGNOSIS — Z515 Encounter for palliative care: Secondary | ICD-10-CM

## 2018-05-01 DIAGNOSIS — F1199 Opioid use, unspecified with unspecified opioid-induced disorder: Secondary | ICD-10-CM

## 2018-05-01 LAB — TOXICOLOGY SCREEN, URINE
AMPHETAMINE SCREEN URINE: <500
BARBITURATE SCREEN URINE: <200
BENZODIAZEPINE SCREEN, URINE: <200
CANNABINOID SCREEN URINE: <20

## 2018-05-01 LAB — BENZODIAZEPINE SCREEN, URINE: Lab: <200

## 2018-05-01 NOTE — Unmapped (Signed)
Dear  Dr. Jean Rosenthal    This correspondence has been sent to you to inform you that  When contacted this patient  requested a  delay for his PT evaluation because he  Had a physician appointment scheduled today. Should you require  Any additional information , please contact the  Home health at [phone]. Once again thank you for your referral and please consider our agency for the future needs of your patient.

## 2018-05-02 MED ORDER — OXYCODONE 30 MG TABLET
ORAL_TABLET | ORAL | 0 refills | 0 days | Status: CP | PRN
Start: 2018-05-02 — End: 2018-05-09

## 2018-05-03 DIAGNOSIS — C7801 Secondary malignant neoplasm of right lung: Secondary | ICD-10-CM

## 2018-05-03 DIAGNOSIS — C7951 Secondary malignant neoplasm of bone: Principal | ICD-10-CM

## 2018-05-03 DIAGNOSIS — C779 Secondary and unspecified malignant neoplasm of lymph node, unspecified: Secondary | ICD-10-CM

## 2018-05-03 DIAGNOSIS — F17210 Nicotine dependence, cigarettes, uncomplicated: Secondary | ICD-10-CM

## 2018-05-03 DIAGNOSIS — Z7952 Long term (current) use of systemic steroids: Secondary | ICD-10-CM

## 2018-05-03 DIAGNOSIS — J449 Chronic obstructive pulmonary disease, unspecified: Secondary | ICD-10-CM

## 2018-05-03 DIAGNOSIS — C649 Malignant neoplasm of unspecified kidney, except renal pelvis: Secondary | ICD-10-CM

## 2018-05-03 DIAGNOSIS — G893 Neoplasm related pain (acute) (chronic): Secondary | ICD-10-CM

## 2018-05-03 DIAGNOSIS — F1410 Cocaine abuse, uncomplicated: Secondary | ICD-10-CM

## 2018-05-03 DIAGNOSIS — D61818 Other pancytopenia: Secondary | ICD-10-CM

## 2018-05-03 DIAGNOSIS — Z9981 Dependence on supplemental oxygen: Secondary | ICD-10-CM

## 2018-05-04 DIAGNOSIS — G893 Neoplasm related pain (acute) (chronic): Secondary | ICD-10-CM

## 2018-05-04 DIAGNOSIS — Z9981 Dependence on supplemental oxygen: Secondary | ICD-10-CM

## 2018-05-04 DIAGNOSIS — C649 Malignant neoplasm of unspecified kidney, except renal pelvis: Secondary | ICD-10-CM

## 2018-05-04 DIAGNOSIS — C7801 Secondary malignant neoplasm of right lung: Secondary | ICD-10-CM

## 2018-05-04 DIAGNOSIS — Z7952 Long term (current) use of systemic steroids: Secondary | ICD-10-CM

## 2018-05-04 DIAGNOSIS — J449 Chronic obstructive pulmonary disease, unspecified: Secondary | ICD-10-CM

## 2018-05-04 DIAGNOSIS — F17210 Nicotine dependence, cigarettes, uncomplicated: Secondary | ICD-10-CM

## 2018-05-04 DIAGNOSIS — C779 Secondary and unspecified malignant neoplasm of lymph node, unspecified: Secondary | ICD-10-CM

## 2018-05-04 DIAGNOSIS — C7951 Secondary malignant neoplasm of bone: Principal | ICD-10-CM

## 2018-05-04 DIAGNOSIS — F1410 Cocaine abuse, uncomplicated: Secondary | ICD-10-CM

## 2018-05-04 DIAGNOSIS — D61818 Other pancytopenia: Secondary | ICD-10-CM

## 2018-05-04 NOTE — Unmapped (Signed)
Spoke with [name] (friend) - she is stating that pt is moaning and groaning. His mind is not there. He was going to bathroom and forgot where he was going and started to void in bed. Pt is not able to follow a conversation and sleeping most of the time. [name] stated that it was a rough weekend and she has a small house that he needs to be moved to his Mom's house which is a block a way. They are unable to move pt on their own. Also spoke with [name] regarding Hospice if this was pt's decision and per [name] pt is not able to decide since he is very sleepy. Spoke with Mom Madelon Lips and she does not want hospice and feels that pt does not want this also. She thinks that pt is alright, as long as I give him only half of the tablet of methadone. He is alert. Spoke with Ardean Larsen and recommended that pt be admitted for pain management and psych eval. Mom disagrees and thinks pt will be alright as long as she gives only half of table of methadone. They will keep pt at home for now and monitor him. Also spoke with Elray Mcgregor RN (Home care)   To give her updates and she will see pt today.

## 2018-05-05 ENCOUNTER — Ambulatory Visit
Admit: 2018-05-05 | Discharge: 2018-05-09 | Disposition: A | Discharge status: Hospice | Payer: MEDICAID | Attending: Internal Medicine | Admitting: Geriatric Medicine

## 2018-05-05 ENCOUNTER — Ambulatory Visit
Admit: 2018-05-05 | Discharge: 2018-05-09 | Disposition: A | Discharge status: Hospice | Payer: MEDICAID | Admitting: Geriatric Medicine

## 2018-05-05 DIAGNOSIS — G893 Neoplasm related pain (acute) (chronic): Principal | ICD-10-CM

## 2018-05-05 DIAGNOSIS — C7951 Secondary malignant neoplasm of bone: Principal | ICD-10-CM

## 2018-05-05 DIAGNOSIS — Z7952 Long term (current) use of systemic steroids: Secondary | ICD-10-CM

## 2018-05-05 DIAGNOSIS — J449 Chronic obstructive pulmonary disease, unspecified: Secondary | ICD-10-CM

## 2018-05-05 DIAGNOSIS — F1410 Cocaine abuse, uncomplicated: Secondary | ICD-10-CM

## 2018-05-05 DIAGNOSIS — Z9981 Dependence on supplemental oxygen: Secondary | ICD-10-CM

## 2018-05-05 DIAGNOSIS — C779 Secondary and unspecified malignant neoplasm of lymph node, unspecified: Secondary | ICD-10-CM

## 2018-05-05 DIAGNOSIS — F17210 Nicotine dependence, cigarettes, uncomplicated: Secondary | ICD-10-CM

## 2018-05-05 DIAGNOSIS — C649 Malignant neoplasm of unspecified kidney, except renal pelvis: Secondary | ICD-10-CM

## 2018-05-05 DIAGNOSIS — D61818 Other pancytopenia: Secondary | ICD-10-CM

## 2018-05-05 DIAGNOSIS — C7801 Secondary malignant neoplasm of right lung: Secondary | ICD-10-CM

## 2018-05-05 LAB — CBC W/ AUTO DIFF
BASOPHILS ABSOLUTE COUNT: 0.1 10*9/L (ref 0.0–0.1)
BASOPHILS RELATIVE PERCENT: 2.4 %
EOSINOPHILS ABSOLUTE COUNT: 0 10*9/L (ref 0.0–0.4)
EOSINOPHILS RELATIVE PERCENT: 0.4 %
HEMATOCRIT: 22 % — ABNORMAL LOW (ref 41.0–53.0)
HEMOGLOBIN: 7 g/dL — ABNORMAL LOW (ref 13.5–17.5)
LYMPHOCYTES ABSOLUTE COUNT: 1.3 10*9/L — ABNORMAL LOW (ref 1.5–5.0)
LYMPHOCYTES RELATIVE PERCENT: 37.1 %
MEAN CORPUSCULAR HEMOGLOBIN CONC: 31.8 g/dL (ref 31.0–37.0)
MEAN CORPUSCULAR HEMOGLOBIN: 26.1 pg (ref 26.0–34.0)
MEAN CORPUSCULAR VOLUME: 82 fL (ref 80.0–100.0)
MEAN PLATELET VOLUME: 7.8 fL (ref 7.0–10.0)
MONOCYTES ABSOLUTE COUNT: 0.3 10*9/L (ref 0.2–0.8)
MONOCYTES RELATIVE PERCENT: 8.4 %
NEUTROPHILS ABSOLUTE COUNT: 1.6 10*9/L — ABNORMAL LOW (ref 2.0–7.5)
NEUTROPHILS RELATIVE PERCENT: 46.4 %
NUCLEATED RED BLOOD CELLS: 12 /100{WBCs} — ABNORMAL HIGH (ref <=4)
RED BLOOD CELL COUNT: 2.68 10*12/L — ABNORMAL LOW (ref 4.50–5.90)
RED CELL DISTRIBUTION WIDTH: 17.6 % — ABNORMAL HIGH (ref 12.0–15.0)
WBC ADJUSTED: 3.5 10*9/L — ABNORMAL LOW (ref 4.5–11.0)

## 2018-05-05 LAB — COMPREHENSIVE METABOLIC PANEL
ALBUMIN: 2.6 g/dL — ABNORMAL LOW (ref 3.5–5.0)
ALKALINE PHOSPHATASE: 267 U/L — ABNORMAL HIGH (ref 38–126)
ALT (SGPT): 22 U/L (ref 19–72)
ANION GAP: 6 mmol/L — ABNORMAL LOW (ref 9–15)
AST (SGOT): 73 U/L — ABNORMAL HIGH (ref 19–55)
BILIRUBIN TOTAL: 0.5 mg/dL (ref 0.0–1.2)
BLOOD UREA NITROGEN: 18 mg/dL (ref 7–21)
BUN / CREAT RATIO: 42
CALCIUM: 8 mg/dL — ABNORMAL LOW (ref 8.5–10.2)
CHLORIDE: 102 mmol/L (ref 98–107)
CO2: 29 mmol/L (ref 22.0–30.0)
CREATININE: 0.43 mg/dL — ABNORMAL LOW (ref 0.70–1.30)
EGFR MDRD AF AMER: >=60 mL/min/{1.73_m2} (ref >=60)
EGFR MDRD NON AF AMER: >=60 mL/min/{1.73_m2} (ref >=60)
GLUCOSE RANDOM: 89 mg/dL (ref 65–179)
POTASSIUM: 4.6 mmol/L (ref 3.5–5.0)
SODIUM: 137 mmol/L (ref 135–145)

## 2018-05-05 LAB — TROPONIN I
TROPONIN I: 0.065 ng/mL (ref <0.034)
Troponin I.cardiac:MCnc:Pt:Ser/Plas:Qn:: 0.062
Troponin I.cardiac:MCnc:Pt:Ser/Plas:Qn:: 0.065
Troponin I.cardiac:MCnc:Pt:Ser/Plas:Qn:: 0.07
Troponin I.cardiac:MCnc:Pt:Ser/Plas:Qn:: 0.074

## 2018-05-05 LAB — PATHOLOGIST SMEAR INTERPRETATION

## 2018-05-05 LAB — URINALYSIS WITH CULTURE REFLEX
BACTERIA: NONE SEEN /HPF
BILIRUBIN UA: NEGATIVE
KETONES UA: 20 — AB
LEUKOCYTE ESTERASE UA: NEGATIVE
NITRITE UA: NEGATIVE
PH UA: 6 (ref 5.0–9.0)
RBC UA: 2 /HPF (ref <=3)
SPECIFIC GRAVITY UA: 1.019 (ref 1.003–1.030)
SQUAMOUS EPITHELIAL: <1 /HPF (ref 0–5)
UROBILINOGEN UA: >=8 — AB
WBC UA: 2 /HPF (ref <=2)

## 2018-05-05 LAB — LIPASE
LIPASE: 64 U/L (ref 44–232)
Triacylglycerol lipase:CCnc:Pt:Ser/Plas:Qn:: 64

## 2018-05-05 LAB — BACTERIA: Lab: NONE SEEN

## 2018-05-05 LAB — LACTATE BLOOD VENOUS: Lactate:SCnc:Pt:BldV:Qn:: 1.1

## 2018-05-05 LAB — SMEAR REVIEW

## 2018-05-05 LAB — EOSINOPHILS RELATIVE PERCENT: Lab: 0.4

## 2018-05-05 LAB — PRO-BNP: Natriuretic peptide.B prohormone N-Terminal:MCnc:Pt:Ser/Plas:Qn:: 467 — ABNORMAL HIGH

## 2018-05-05 LAB — CREATININE: Creatinine:MCnc:Pt:Ser/Plas:Qn:: 0.43 — ABNORMAL LOW

## 2018-05-05 NOTE — Unmapped (Signed)
Patient reports his family called EMS for him because he was less responsive. Patient denies SOB or worsening difficulty breathing 2/2 PNA. Respirations even, unlabored on home 2L [place]. Patient does endorse 10/10 generalized cancer pain. Patient is lethargic, slow to answer questions, oriented to person, place, but not time. Placed on cardiac monitor. Updated on POC. Bed low and locked, side rails up x2, call light within reach.

## 2018-05-05 NOTE — Unmapped (Addendum)
PMH of bone CA with recent dx of PNA BIB EMS with increased SOB and BLE pain.

## 2018-05-05 NOTE — Unmapped (Signed)
Report receive, Patient care transferred to me at this time.

## 2018-05-05 NOTE — Unmapped (Signed)
Spoke with Russell [name] this morning and they had another rough night. Pt not doing well. Pt sleeping a lot and takes about 45 minutes to get pt to the bathroom. Continue to moan and groan. Let her know that it would be best to bring pt to the ER. Dr. Okey Dupre and Dr. Legrand Como recommends for pt to be admitted. Russell [name] agrees and will bring pt in.

## 2018-05-05 NOTE — Unmapped (Signed)
-----   Message from Jennye Moccasin, MD sent at 05/04/2018  5:37 PM EDT -----  Yes sounds like he is doing terribly. Hospice would be appropriate if he is agreeable - Soraya talked to them today and sounds like Russell [name] wants hospice, mother doesn't and thinks that patient wouldn't. I recommended he come into hospital given uncertainty, with likely hospice transition. He clearly isn't going to tolerate treatment like this though so hospice seems like only reasonable option    Thanks  French Ana    ----- Message -----  From: Sonny Masters, MD  Sent: 05/04/2018   4:51 PM  To: Belva Bertin, RN, Jennye Moccasin, MD    French Ana    I spoke with Russell [name] is staying with her.    Russell [name] shared that Russell [name] continues to decline - sounds like delirium, also had fever today and EMS came to the house but didn't take him to the ER.    I saw Russell [name] a few days ago - weaker, requiring oxygen.  His pain was unchanged - I actually saw him with an addiction specialist to get her input - he again wasn't truthful with me about cocaine use.    He only received the chemo a few days.  Given how poorly he's been faring, I am wondering about hospice services both to assist Russell [name] and his friends/mother.  Welcome your thoughts - feel free to call my cell, [phone]    [facility]

## 2018-05-05 NOTE — Unmapped (Signed)
Patient rounds completed. The following patient needs were addressed:  Pain, Personal Belongings, Call Bell in Reach and Bed Position Low, friend at [facility]. Bed in low locked position, side rail is up, call bell provided. Plans to admit per ED Provider.

## 2018-05-05 NOTE — Unmapped (Signed)
[facility] Emergency Department Provider Note      ED Clinical Impression     Final diagnoses:   Altered mental status, unspecified altered mental status type (Primary)   Pneumonia of both upper lobes due to infectious organism (CMS-HCC)   Renal cell carcinoma, unspecified laterality (CMS-HCC)       Initial Impression, ED Course, Assessment and Plan   Patient is a 57 y.o. male with a PMH of COPD, polysubstance abuse, and metastatic RCC (currently taking everolimus and lenvatinib, started on 04/30/18) presenting to the ED for evaluation of altered mental status and uncontrolled pain ongoing intermittently for the last month by worse in the last 3 days.  PE notable for dry mucous membranes, ill-appearing, diffuse abdominal pain, tenderness to LBP, bilateral knees, hips, and arms, tachycardic heart rate, pale complexion, and slow to respond but with appropriate responses.    Concern for acute on chronic uncontrolled pain secondary to metastatic cancer, suspect a component of polypharmacy contributing to his altered mental status given his heavy use of opioids.  Also concern for resistant to outpatient therapy PNA with shortness of breath, oxygen requirement, low-grade fevers, and altered mental status.  Would also consider progressive disease as potential cause of AMS.  Will obtain basic labs, troponin, lactate, proBNP, blood cultures x2, chest x-ray, and will start PIV give 1 L normal saline bolus with fentanyl 50 mcg as needed every 2 hours to control pain.  We will also contact patient's primary oncology team as patient was recently discharged from the hospital for similar complaints.    13:55  Paged onc team    14:11  CMP with marginal changes from baseline, lactate WNL, Lipase WNL, CBC notable for slightly worsened anemia with Hgb of 7.0, Hct of 22.0, previously 7.6 and 23.9 on 04/29/18     14:23  CXR with increased biapical opacifications, will start on IV Levaquin, IVF infusing, ED admit decision placed, paged MAO, spoke to Onc team who agrees with admission     15:43  Pro-BNP 467.0, I do not think patient is in volume overload as lung sounds without crackles and no edema to bilateral lower extremities, CXR without pleural effusions    16:48  Troponin elevated to 0.074, but hemolyzed, will re-draw, will add EKG, pt. Without CP    16:58  Patient's presentation and treatment plan discussed and signed out to ED PA [facility] Reviewed   PRO-BNP - Abnormal; Notable for the following components:       Result Value    PRO-BNP 467.0 (*)     All other components within normal limits   TROPONIN I - Abnormal; Notable for the following components:    Troponin I 0.074 (*)     All other components within normal limits   COMPREHENSIVE METABOLIC PANEL - Abnormal; Notable for the following components:    Creatinine 0.43 (*)     Anion Gap 6 (*)     Calcium 8.0 (*)     Albumin 2.6 (*)     Total Protein 5.1 (*)     AST 73 (*)     Alkaline Phosphatase 267 (*)     All other components within normal limits   URINALYSIS WITH CULTURE REFLEX - Abnormal; Notable for the following components:    Protein, UA Trace (*)     Ketones, UA 20 mg/dL (*)     Urobilinogen, UA >=8 mg/dL (*)     Mucus, UA  Occasional (*)     All other components within normal limits   CBC W/ AUTO DIFF - Abnormal; Notable for the following components:    WBC 3.5 (*)     RBC 2.68 (*)     HGB 7.0 (*)     HCT 22.0 (*)     RDW 17.6 (*)     Platelet 84 (*)     nRBC 12 (*)     Variable HGB Concentration Slight (*)     Absolute Neutrophils 1.6 (*)     Absolute Lymphocytes 1.3 (*)     Large Unstained Cells 5 (*)     Microcytosis Slight (*)     Anisocytosis Slight (*)     Hypochromasia Marked (*)     All other components within normal limits    Narrative:     Please use the Absolute Differential for reference ranges.   This is an appended report.  These results have been appended to a previously verified report.   SLIDE REVIEW - Abnormal; Notable for the following components:    Smear Review Comments See Comment (*)     All other components within normal limits   LACTATE, VENOUS, WHOLE BLOOD - Normal   LIPASE - Normal   BLOOD CULTURE   BLOOD CULTURE   CBC W/ DIFFERENTIAL    Narrative:     The following orders were created for panel order CBC w/ Differential.  Procedure                               Abnormality         Status                     ---------                               -----------         ------                     CBC w/ Differential[[id number]]         Abnormal            Final result               Morphology Review[[id number]]           Abnormal            Final result               Pathologist Smear [id number]]                        Final result                 Please view results for these tests on the individual orders.   PERIPHERAL BLOOD SMEAR, PATH REVIEW    Narrative:     Leukoerythroblastic blood smear with rare blast identified.   TROPONIN I       Radiology     Xr Chest 2 Views    Result Date: 05/05/2018  EXAM: XR CHEST 2 VIEWS DATE: 05/05/2018 1:53 PM ACCESSION: [id number] UN DICTATED: 05/05/2018 1:56 PM INTERPRETATION LOCATION: [facility] CLINICAL INDICATION: 57 years old Male with COUGH-  COMPARISON: 04/28/2018. TECHNIQUE: PA and Lateral Chest Radiographs. FINDINGS: Right perihilar consolidative opacity concerning for malignancy. Diffuse linear/nodular opacities bilaterally concerning  for metastatic disease. With respect to 04/28/2018, there is suggestion of increased biapical opacification. No pleural effusion or pneumothorax. Sclerosis of the right scapula unchanged.     Right perihilar consolidative opacity and diffuse reticular nodular opacities concerning for malignancy. Increasing biapical opacifications compared to 04/28/2018 may be on the basis of infection.    History     Chief Complaint  Shortness of Breath      HPI   Patient was seen by me at 12:49 PM.    Patient is a 57 y.o. male with a PMH of COPD, polysubstance abuse, and metastatic RCC (currently taking everolimus and lenvatinib, started on 04/30/18) presenting to the ED for evaluation of altered mental status and uncontrolled pain ongoing intermittently for the last month by worse in the last 3 days.  Patient was recently admitted to the hospital on 5/27-5/29 was noted to have postobstructive pneumonia, he was started on Augmentin for 5-day course which he reports he has been taking compliantly without relief of symptoms, he continues to have cough, shortness of breath (on 2 L via nasal cannula), and continues to have low-grade fevers, last one was 100.0 yesterday.  Patient continues to also complain of uncontrolled chronic pain, is continuing his home regimen of methadone 10 mg 3 times daily, oxycodone 30 mg every 4 hours, Tylenol, and gabapentin with minimal relief.  Continues to report pain in his lower back, bilateral knees, and left hip, he has had no swelling/edema/erythema to touch of joints.  Upon discharge plan was to have in-home PT for which she has not started at this time.  In regards to his altered mental status caregiver at bedside reports patient has an increase in alertness and orientation worsened in the last 4 days.  Patient has decreased ability to perform ADLs, difficulty communicating, and disoriented intermittently throughout the day.  He has declined any headache, vision changes, focal motor deficits, numbness or tingling, or any appropriate use of pain medications or substances.  Per caregiver patient has started to hold medications in his mouth, reports mouth is painful and has difficulty swallowing.  He was able to take his morning medications today.    Previous chart, nursing notes, and vital signs reviewed.      Pertinent labs & imaging results that were available during my care of the patient were reviewed by me and considered in my medical decision making (see chart for details).    Past Medical History:   Diagnosis Date   ??? Bone metastasis (CMS-HCC)    ??? Renal cancer (CMS-HCC)        Past Surgical History:   Procedure Laterality Date   ??? FLEXIBLE BRONCHOSCOPY  07/24/2017   ??? HERNIA REPAIR           Current Facility-Administered Medications:   ???  fentaNYL (PF) (SUBLIMAZE) injection 50 mcg, 50 mcg, Intravenous, Q2H PRN, Antonin Meininger Cox Kyah Buesing, AGNP, 50 mcg at 05/05/18 1527  ???  sodium chloride 0.9% (NS BOLUS) bolus 1,000 mL, 1,000 mL, Intravenous, Once, Kain Milosevic Cox Pax Reasoner, AGNP, Last Rate: 500 mL/hr at 05/05/18 1539, 1,000 mL at 05/05/18 1539    Current Outpatient Medications:   ???  acetaminophen (TYLENOL) 500 MG tablet, Take 2 tablets (1,000 mg total) by mouth three (3) times a day (at 6am, noon and 6pm)., Disp: 30 tablet, Rfl: 0  ???  albuterol (PROVENTIL HFA;VENTOLIN HFA) 90 mcg/actuation inhaler, Inhale 2 puffs every four (4) hours as needed. , Disp: , Rfl:   ???  amLODIPine (NORVASC) 5 MG  tablet, Take 1 tablet (5 mg total) by mouth daily., Disp: 90 tablet, Rfl: 5  ???  amoxicillin-clavulanate (AUGMENTIN) 875-125 mg per tablet, Take 1 tablet by mouth every twelve (12) hours. for 10 days, Disp: 20 tablet, Rfl: 0  ???  dexamethasone (DECADRON) 0.5 mg/5 mL solution, Take 10 mL (1 mg total) by mouth Four (4) times a day. Swish and spit., Disp: 500 mL, Rfl: 12  ???  diclofenac sodium (VOLTAREN) 1 % gel, Apply 2 g topically Four (4) times a day., Disp: 100 g, Rfl: 0  ???  everolimus, antineoplastic, (AFINITOR) 5 mg tablet, Take 1 tablet (5 mg total) by mouth daily., Disp: 30 tablet, Rfl: 5  ???  gabapentin (NEURONTIN) 300 MG capsule, Take 3 capsules (900 mg total) by mouth Three (3) times a day., Disp: 270 capsule, Rfl: 0  ???  ibuprofen (ADVIL) 200 MG tablet, Take 200 mg by mouth every eight (8) hours as needed for pain., Disp: , Rfl:   ???  ibuprofen (ADVIL,MOTRIN) 600 MG tablet, Take 1 tablet (600 mg total) by mouth every eight (8) hours as needed for pain., Disp: 60 tablet, Rfl: 2  ???  lenvatinib 18 mg/day (10 mg x 1-4 mg x2) cap, Take 18 mg by mouth daily., Disp: 90 capsule, Rfl: 5  ???  lidocaine (LIDODERM) 5 % patch, Place 1 patch on the skin daily. Apply to affected area for 12 hours only each day (then remove patch), Disp: 90 patch, Rfl: 0  ???  methadone (DOLOPHINE) 10 MG tablet, Take 1 tablet (10 mg total) by mouth every eight (8) hours. Fill on or after 04/23/18, Disp: 21 tablet, Rfl: 0  ???  naloxone (NARCAN) 4 mg nasal spray, One spray in either nostril once for known/suspected opioid overdose. May repeat every 2-3 minutes in alternating nostril til EMS arrives, Disp: 1 each, Rfl: 0  ???  nicotine (NICODERM CQ) 14 mg/24 hr patch, Place 1 patch on the skin daily., Disp: 28 patch, Rfl: 0  ???  nicotine polacrilex (NICORETTE) 2 mg gum, Apply 1 each (2 mg total) to cheek every hour as needed for smoking cessation. (Patient not taking: Reported on 05/01/2018), Disp: 110 each, Rfl: 0  ???  omeprazole (PRILOSEC) 20 MG capsule, Take 20 mg by mouth daily. TAKE 1 CAPSULE (20 MG TOTAL) BY MOUTH DAILY., Disp: , Rfl: 6  ???  ondansetron (ZOFRAN-ODT) 4 MG disintegrating tablet, Take 2 tablets (8 mg total) by mouth every eight (8) hours as needed for nausea., Disp: 60 tablet, Rfl: 3  ???  oxyCODONE (ROXICODONE) 30 MG immediate release tablet, Take 1 tablet (30 mg total) by mouth every four (4) hours as needed for pain. Fill on or after 04/23/18, Disp: 42 tablet, Rfl: 0  ???  predniSONE (DELTASONE) 5 MG tablet, 30 mg 5/17, 15 mg x3 days, 10 mg x3 days, 5mg  x3 days, (Patient not taking: Reported on 05/01/2018), Disp: 24 tablet, Rfl: 0  ???  tiotropium (SPIRIVA WITH HANDIHALER) 18 mcg inhalation capsule, Place 18 mcg into inhaler and inhale., Disp: , Rfl:   ???  urea (CARMOL) 40 % cream, Apply 1 application topically Two (2) times a day. To hands and feet., Disp: 198 g, Rfl: 6    Allergies  Patient has no known allergies.    Family History   Problem Relation Age of Onset   ??? Brain cancer Paternal Uncle        Social History  Social History     Tobacco Use   ???  Smoking status: Current Every Day Smoker     Packs/day: 1.00     Types: Cigarettes ??? Smokeless tobacco: Never Used   ??? Tobacco comment: Currently less than a quarter pack due to drowsiness from meds   Substance Use Topics   ??? Alcohol use: Yes   ??? Drug use: Yes     Types: Cocaine       Review of Systems    Constitutional: Positive for fever or chills. Negative for wt. loss.  Eyes: Negative for visual changes, erythema, drainage.  ENT: Negative for ear pain, loss of hearing. Negative for rhinorrhea or epistaxis. Negative for sore throat, trismus, or hoarse voice. Positive for pain with swallowing.  Cardiovascular: Negative for chest pain or palpitations.  Respiratory: Positive for shortness of breath or cough (per baseline).  Gastrointestinal: Positive for abdominal pain, Negative nausea, vomiting, constipation or diarrhea.  Genitourinary: Negative for dysuria, urgency, frequency, hesitancy, hematuria.  Musculoskeletal: Positive for back pain, hip, and knee pain. Negative for neck pain. Negative for joint pain or swelling.  Skin: Negative for rash. Negative for pallor. Negative for diaphoresis.  Neurological: Negative for headaches, weakness, LOC, dizziness, or numbness. Positive for AMS  Psych: Negative for SI, HI, A/V Hallucinations. Negative for agitation.       Physical Exam     VITAL SIGNS:    Vitals:    05/05/18 1330 05/05/18 1400 05/05/18 1430 05/05/18 1535   BP:    134/95   Pulse: 98 100 101 99   Resp: 14 16 16 16    Temp:       TempSrc:       SpO2: 92% 95% 97% 96%   Weight:    83.5 kg (184 lb)   Height:    170.2 cm (5' 7)         Constitutional: Alert and oriented. Ill appearing and in no distress. Pale appearing.  Eyes: Conjunctivae are pale.   ENT       Head: Normocephalic and atraumatic.       Ear: EACs without exudate or erythema. TMs without erythema or effusion       Nose: No congestion. No epistaxis       Mouth/Throat: Mucous membranes are dry. Posterior        Oropharynx patent. Tonsils without erythema or exudate. Uvula is midline. Soft        Palate is soft without induration. Dentition intact without cracks/decay.       Neck: No stridor. Thyroid without nodules. Full ROM without pain. No spinous        Process tenderness. No nuchal rigidity.  Hematological/Lymphatic/Immunilogical: No cervical lymphadenopathy.  Cardiovascular: Tachycardic rate, regular rhythm. Normal and symmetric distal pulses are present in all extremities. No gallops, murmurs, or clicks.  Respiratory: Normal respiratory effort. Breath sounds are normal.  Gastrointestinal: Soft and diffusely tender. BS active. No guarding or rigidity. Negative McBurney's point. Negative Murphy's sign. No CVA tenderness.  Musculoskeletal: Nontender joints and muscles with normal range of motion in all extremities. Diffuse ttp, worse to bilateral knees, left hip, and lower back. No erythema, edema, or warmth to touch of joints.  Neurologic: Normal speech and language. Steady gait. Able to walk on tiptoes and heels without difficulty.  Normal tandem gait.  Skin: Skin is warm, dry and intact. No rash noted. No pallor.  Psychiatric: Mood and affect are normal. Speech and behavior are normal.          [employer], AGNP  05/05/18 1658

## 2018-05-06 LAB — CBC W/ AUTO DIFF
BASOPHILS ABSOLUTE COUNT: 0.1 10*9/L (ref 0.0–0.1)
BASOPHILS RELATIVE PERCENT: 1.8 %
EOSINOPHILS ABSOLUTE COUNT: 0 10*9/L (ref 0.0–0.4)
EOSINOPHILS RELATIVE PERCENT: 0.6 %
HEMATOCRIT: 22.9 % — ABNORMAL LOW (ref 41.0–53.0)
HEMOGLOBIN: 7.1 g/dL — ABNORMAL LOW (ref 13.5–17.5)
LARGE UNSTAINED CELLS: 5 % — ABNORMAL HIGH (ref 0–4)
LYMPHOCYTES ABSOLUTE COUNT: 1 10*9/L — ABNORMAL LOW (ref 1.5–5.0)
LYMPHOCYTES RELATIVE PERCENT: 37.2 %
MEAN CORPUSCULAR HEMOGLOBIN CONC: 30.9 g/dL — ABNORMAL LOW (ref 31.0–37.0)
MEAN CORPUSCULAR HEMOGLOBIN: 25.8 pg — ABNORMAL LOW (ref 26.0–34.0)
MEAN PLATELET VOLUME: 8.9 fL (ref 7.0–10.0)
MONOCYTES ABSOLUTE COUNT: 0.2 10*9/L (ref 0.2–0.8)
MONOCYTES RELATIVE PERCENT: 9.4 %
NEUTROPHILS ABSOLUTE COUNT: 1.2 10*9/L — ABNORMAL LOW (ref 2.0–7.5)
NEUTROPHILS RELATIVE PERCENT: 45.6 %
NUCLEATED RED BLOOD CELLS: 17 /100{WBCs} — ABNORMAL HIGH (ref <=4)
PLATELET COUNT: 62 10*9/L — ABNORMAL LOW (ref 150–440)
RED BLOOD CELL COUNT: 2.75 10*12/L — ABNORMAL LOW (ref 4.50–5.90)
RED CELL DISTRIBUTION WIDTH: 17.3 % — ABNORMAL HIGH (ref 12.0–15.0)
WBC ADJUSTED: 2.6 10*9/L — ABNORMAL LOW (ref 4.5–11.0)

## 2018-05-06 LAB — OPIATE, URINE, QUANTITATIVE
CODEINE GC/MS CONF: <50 ng/mL
HYDROCODONE GC/MS CONF: <50 ng/mL
HYDROMORPHONE GC/MS CONF: <50 ng/mL
MORPHINE GC/MS CONF: 103 ng/mL
NORBUPRENORPHINE: <5 ng/mL
OXYCODONE (GC/MS): 31529 ng/mL
OXYMORPHONE: 42199 ng/mL

## 2018-05-06 LAB — BASIC METABOLIC PANEL
ANION GAP: 7 mmol/L — ABNORMAL LOW (ref 9–15)
BLOOD UREA NITROGEN: 17 mg/dL (ref 7–21)
BUN / CREAT RATIO: 35
CALCIUM: 7.8 mg/dL — ABNORMAL LOW (ref 8.5–10.2)
CHLORIDE: 102 mmol/L (ref 98–107)
CO2: 27 mmol/L (ref 22.0–30.0)
CREATININE: 0.48 mg/dL — ABNORMAL LOW (ref 0.70–1.30)
EGFR MDRD AF AMER: >=60 mL/min/{1.73_m2} (ref >=60)
GLUCOSE RANDOM: 79 mg/dL (ref 65–179)
SODIUM: 136 mmol/L (ref 135–145)

## 2018-05-06 LAB — TROPONIN I
Troponin I.cardiac:MCnc:Pt:Ser/Plas:Qn:: 0.061
Troponin I.cardiac:MCnc:Pt:Ser/Plas:Qn:: 0.067

## 2018-05-06 LAB — HYDROMORPHONE GC/MS CONF: Lab: <50

## 2018-05-06 LAB — METHADONE SCREEN, URINE

## 2018-05-06 LAB — TOXICOLOGY SCREEN, URINE
AMPHETAMINE SCREEN URINE: <500
BENZODIAZEPINE SCREEN, URINE: <200
CANNABINOID SCREEN URINE: <20

## 2018-05-06 LAB — MAGNESIUM: Magnesium:MCnc:Pt:Ser/Plas:Qn:: 2.2

## 2018-05-06 LAB — SODIUM: Sodium:SCnc:Pt:Ser/Plas:Qn:: 136

## 2018-05-06 LAB — EOSINOPHILS RELATIVE PERCENT: Lab: 0.6

## 2018-05-06 NOTE — Unmapped (Signed)
Admit MD at bedside

## 2018-05-06 NOTE — Unmapped (Signed)
Diet order placed 

## 2018-05-06 NOTE — Unmapped (Signed)
Patient rounds completed. The following patient needs were addressed:  Pain, Toileting, Personal Belongings, Plan of Care, Call Bell in Reach and Bed Position Low .

## 2018-05-06 NOTE — Unmapped (Addendum)
Radiation Oncology Consult Note    Patient Name: Russell [name]  Patient Age: 57 y.o.  Encounter Date: 05/05/2018    Referring Physician:   Lillia Carmel, MD  Internal Medicine  .  Primary Care Provider: [facility]        Assessment:   Russell [name] is a 57 y.o. male with metastatic RCC, re-admitted with significant pain in the left hip 2/2 RCC    Recommendations:  I have talked to [name] several times about his uncontrolled left back, hip, thigh pain (see prior radiation oncology notes).    I discussed at length again with him and his mother the nature of his disease and the potential use of radiation therapy. I discussed that while it is not certain that radiation therapy may help, given his continued uncontrolled pain, I believe there may be more potential for benefit than harm to trying radiation therapy to an extended field encompassing his left hemipelvis/femur.    I also highly encouraged continued discussions about GOC and involvement of palliative care.    Plan  - CT simulation today  - Plan for palliative radiation to left hemipelvis and femur     History of Present Illness:    Onc history:  Summer 2018- p/w shoulder pain, found to have large renal mass with mets to lung, LN, bone. Had R lung collapse and rapid POD in lung LNs. Bx of RUL tumor with RCC  07/2017- chest XRT  07/22/17- ipi/nivo --> nivo x5, stopped d/t RT pneumonitis vs. nivo pneumonitis, treated with steroids  01/2018- XRT to new R femur and iliac mets  01/08/18- start cabozantinib, c/b HTN, hand foot syndrome, mouth sores    04/09/18: Admitted for worsening left hip, back, and leg pain. Imaging at that time showed widespread osseous disease without any clear focal areas of disease that correlated that could be targeted for radiation. At that time, we recommended continued medical management instead of radiation.    04/24/18: Admitted for altered mental status. Continued to have polysusbtance abuse (cocaine and prescribed opioids.) He was previously discharged on methadone 15mg  TID, gabapentin 900mg  TID, oxycodone 30mg  Q4h PRN, ibuprofen 600mg  TID PRN, acetaminophen 1000mg  TID and lidocaine patches all for pain    5/27-5/29: Admitted for pain.  He also had a fever during that admission with shortness of breath and a new oxygen requirement of 2L via nasal cannula.  He was treated with Augmentin for a 10 day course, which he is still taking      He then developed increasing pain and confusion per his friend [name]. He was referred to home hospice but his mother did not think that was a good idea who is also here.    Today, he reports pain at left hip at left thigh just above the knee        Prior Radiation Therapy:yes lung and R hip  Pregnancy status: No; male patient    Review of Systems:  A 10 systems was negative except for pertinent positives noted in HPI.     PAST MEDICAL HISTORY:  Past Medical History:   Diagnosis Date   ??? Bone metastasis (CMS-HCC)    ??? Renal cancer (CMS-HCC)        FAMILY HISTORY:  Family History   Problem Relation Age of Onset   ??? Brain cancer Paternal Uncle        SOCIAL HISTORY:   Social History     Socioeconomic History   ??? Marital  status: Single     Spouse name: Not on file   ??? Number of children: Not on file   ??? Years of education: Not on file   ??? Highest education level: Not on file   Occupational History   ??? Not on file   Social Needs   ??? Financial resource strain: Not on file   ??? Food insecurity:     Worry: Not on file     Inability: Not on file   ??? Transportation needs:     Medical: Not on file     Non-medical: Not on file   Tobacco Use   ??? Smoking status: Current Every Day Smoker     Packs/day: 1.00     Types: Cigarettes   ??? Smokeless tobacco: Never Used   ??? Tobacco comment: Currently less than a quarter pack due to drowsiness from meds   Substance and Sexual Activity   ??? Alcohol use: Yes   ??? Drug use: Yes     Types: Cocaine   ??? Sexual activity: Not on file   Lifestyle   ??? Physical activity:     Days per week: Not on file     Minutes per session: Not on file   ??? Stress: Not on file   Relationships   ??? Social connections:     Talks on phone: Not on file     Gets together: Not on file     Attends religious service: Not on file     Active member of club or organization: Not on file     Attends meetings of clubs or organizations: Not on file     Relationship status: Not on file   Other Topics Concern   ??? Not on file   Social History Narrative   ??? Not on file       No Known Allergies    Current Medications:  Current Facility-Administered Medications   Medication Dose Route Frequency Provider Last Rate Last Dose   ??? acetaminophen (TYLENOL) tablet 1,000 mg  1,000 mg Oral TID Clance Boll, MD   1,000 mg at 05/06/18 0549   ??? albuterol (PROVENTIL HFA;VENTOLIN HFA) 90 mcg/actuation inhaler 2 puff  2 puff Inhalation Q4H PRN Clance Boll, MD       ??? amLODIPine (NORVASC) tablet 5 mg  5 mg Oral Daily Clance Boll, MD   5 mg at 05/06/18 0819   ??? diclofenac sodium (VOLTAREN) 1 % gel 2 g  2 g Topical 4x Daily Clance Boll, MD   2 g at 05/06/18 0549   ??? enoxaparin (LOVENOX) syringe 40 mg  40 mg Subcutaneous Q24H Clance Boll, MD   40 mg at 05/05/18 2313   ??? gabapentin (NEURONTIN) capsule 300 mg  300 mg Oral TID Clance Boll, MD   300 mg at 05/06/18 0818   ??? ibuprofen (ADVIL,MOTRIN) tablet 200 mg  200 mg Oral Q8H PRN Clance Boll, MD   200 mg at 05/05/18 2314   ??? levofloxacin (LEVAQUIN) 750 mg/150 mL IVPB 750 mg  750 mg Intravenous Q24H Clance Boll, MD       ??? lidocaine (LIDODERM) 5 % patch 1 patch  1 patch Transdermal Q24H Clance Boll, MD   1 patch at 05/05/18 2117   ??? melatonin tablet 3 mg  3 mg Oral Nightly PRN Clance Boll, MD       ??? methadone (DOLOPHINE) tablet 15 mg  15 mg Oral BID  Clance Boll, MD   15 mg at 05/06/18 0819   ??? nicotine (NICODERM CQ) 14 mg/24 hr patch 1 patch  1 patch Transdermal Daily Clance Boll, MD   1 patch at 05/06/18 6644   ??? nicotine polacrilex (NICORETTE) gum 2 mg  2 mg Buccal Q1H PRN Clance Boll, MD       ??? ondansetron (ZOFRAN-ODT) disintegrating tablet 8 mg  8 mg Oral Q8H PRN Clance Boll, MD       ??? oxyCODONE (ROXICODONE) immediate release tablet 30 mg  30 mg Oral Q4H PRN Clance Boll, MD   30 mg at 05/06/18 0819   ??? pantoprazole (PROTONIX) EC tablet 20 mg  20 mg Oral Daily Clance Boll, MD   20 mg at 05/06/18 0819   ??? polyethylene glycol (MIRALAX) packet 17 g  17 g Oral Daily PRN Clance Boll, MD   17 g at 05/05/18 2315   ??? senna (SENOKOT) tablet 1 tablet  1 tablet Oral Nightly Clance Boll, MD   1 tablet at 05/05/18 2315   ??? tiotropium (SPIRIVA) 18 mcg inhalation capsule 18 mcg  18 mcg Inhalation Daily (RT) Clance Boll, MD   18 mcg at 05/06/18 0829         Physical Exam:   Vitals:    05/06/18 0731   BP: 149/93   Pulse: 101   Resp: 20   Temp: 36.6 ??C (97.9 ??F)   SpO2: 96%     ECOG 2  General: Well-developed, no acute distress  Skin: No lesions or rashes  Neuro: Alert and oriented to conversation  HEENT:Normocephalic, moist mucous membranes  Eyes: EOMI  Cardio: Normal rate.  Respiratory: Breathing comfortably on room air  GI: Non-distended   MSK: Tender to touch at left hip  Psych: Normal mood and affect.  Converses clearly and emotionally appropriate.       RADIOLOGY: Personally reviewed.  See HPI    PATHOLOGY: Personally reviewed. See HPI    Labs:      Lab Results   Component Value Date    WBC 2.6 (L) 05/06/2018    WBC 3.5 (L) 05/05/2018    WBC 3.9 (L) 04/29/2018    WBC 4.5 04/28/2018    WBC 3.8 (L) 04/27/2018    WBC 3.2 (L) 04/25/2018    WBC 2.5 (L) 04/24/2018    WBC 3.0 (L) 04/24/2018    HGB 7.1 (L) 05/06/2018    HGB 7.0 (L) 05/05/2018    HGB 7.6 (L) 04/29/2018    HGB 8.4 (L) 04/28/2018    HGB 6.9 (L) 04/27/2018    HGB 7.4 (L) 04/25/2018    HGB 6.1 (L) 04/24/2018    HGB 6.5 (L) 04/24/2018    HCT 22.9 (L) 05/06/2018    HCT 22.0 (L) 05/05/2018    HCT 23.9 (L) 04/29/2018    HCT 25.8 (L) 04/28/2018    HCT 21.9 (L) 04/27/2018    HCT 23.2 (L) 04/25/2018    HCT 20.1 (L) 04/24/2018    HCT 20.7 (L) 04/24/2018    PLT 62 (L) 05/06/2018    PLT 84 (L) 05/05/2018    PLT 69 (L) 04/29/2018    PLT 68 (L) 04/28/2018    PLT 68 (L) 04/27/2018    PLT 92 (L) 04/25/2018    PLT 90 (L) 04/24/2018    PLT 80 (L) 04/24/2018    LDH 6,831 (H) 04/24/2018    CREATININE 0.48 (L) 05/06/2018  CREATININE 0.43 (L) 05/05/2018    CREATININE 0.51 (L) 04/29/2018    CREATININE 0.47 (L) 04/28/2018    CREATININE 0.47 (L) 04/28/2018    CREATININE 0.52 (L) 04/27/2018    CREATININE 0.42 (L) 04/25/2018    CREATININE 0.56 (L) 04/24/2018    AST 73 (H) 05/05/2018    AST 91 (H) 04/29/2018    AST 83 (H) 04/28/2018    AST 83 (H) 04/28/2018    AST 58 (H) 04/27/2018    AST 83 (H) 04/25/2018    AST 79 (H) 04/24/2018    ALT 22 05/05/2018    ALT 29 04/29/2018    ALT 34 04/28/2018    ALT 34 04/28/2018    ALT 30 04/27/2018    ALT 34 04/25/2018    ALT 39 04/24/2018    MG 2.2 05/06/2018    MG 2.0 04/29/2018    MG 2.1 04/28/2018    MG 2.2 04/25/2018    MG 2.4 (H) 04/24/2018         **I have reviewed all outside records as it pertains to patient's care.**      Lonzo Cloud, MD, MBA  Resident Physician PGY-3  Department of Radiation Oncology    Trena Platt MD MS MPH  Assistant Professor  Department of Radiation Oncology  [facility]

## 2018-05-06 NOTE — Unmapped (Signed)
ED Progress Note    6:28 PM call from the lab with a critical value and note that troponin is elevated at 0.065 but once again hemolyzed.  Will reorder.    I note EKG???normal sinus rhythm with a rate of 100 bpm with early transition.  Nonspecific ST and T wave changes and otherwise normal EKG.    I note troponin was redrawn several times as sample was hemolyzed and troponin noted to be elevated at 0.062 at about 9:30 PM.    Admitting team aware.  Please see their note.

## 2018-05-06 NOTE — Unmapped (Signed)
Daily Progress Note    Assessment/Plan:    Principal Problem:    Encephalopathy  Active Problems:    Metastatic renal cell carcinoma to bone (CMS-HCC)    Substance abuse (CMS-HCC)    Cancer related pain    Essential hypertension    Pancytopenia (CMS-HCC)    Shortness of breath    Tobacco use disorder    Russell [name] is a 57 y.o. male with PMHx of COPD, polysubstance abuse, metastatic RCC (currently on everolimus and lenvatinib) who presented to [facility] with uncontrolled pain, continued shortness of breath and cough.  ??  Metastatic RCC - Uncontrolled Pain:  Patient has diffuse mets (bone, lung, LN), and has progressed on first and second line therapy chemotherapy, currently he is on Everolimus and Lenvatinib.  Primary oncologist is Dr. Rod Can.  Patient has a history of difficult to control pain, with a recent admission to 4 Onc for uncontrolled pain. Patient follows with Dr. Legrand Como for pain mgmt. Patient and family have been discussing and considering hospice as an option, and the patient is open to the idea of hospice.  -- Consulted radiation oncology today, planning to do CT simulation today and palliative radiation starting tomorrow for bony mets  -- Palliative care consult for pain and continued goals of care discussion   -- Tylenol 1000 mg TID, home Methadone 15 mg BID, home Oxycodone 30 mg q4h PRN  -- Decrease gabapentin from home dose of 900 mg TID to 300 mg TID  -- Voltaren Gel PRN, Lidocaine Patch  ??  PNA:  Patient currently being treated with a 10 day course of Augmentin (started on 5/28 during prior hospitalization)  for dyspnea, dry cough, new oxygen requirement of 2L. He continues to have cough, dyspnea, and low grade fevers at home. He is stable on home 2L of oxygen by nasal cannula and afebrile in the ED.  Lactate wnl.  CXR on admission showed increasing biapical opacifications compared to prior, possibly infectious.  -- Levaquin 750 mg daily x 7 days (6/4-)  ??  Pancytopenia: Hemoglobin and platelets both low but stable compared to prior.    -- Type and Screen, Consent for blood products obtained  -- Transfuse for Hgb <7, Plts <10  ??  Substance, tobacco use: Reportedly groggy/confused prior to admission. Consistently cocaine positive on utox in the past, not checked on admission.  - Nicotine patch and nicotine gum PRN  - Utox ordered  ??  HTN: Continue home Amlodipine  ??  Elevated Troponin, downtrended: Troponin initially elevated in the ED, but multiple samples hemolyzed.  Patient with no chest pain.  EKG with no ischemic changes.  Likely 2/2 demand and likely cocaine abuse.  Troponin previously elevated during prior admissions from 0.066-0.095  ??  FEN/GI/Proph:  -- No IVF, replace lytes PRN, regular diet  -- home PPI, Lovenox [place]  ??  Code Status: Full Code, confirmed with patient on admission     LOS: 1 day     Subjective:    Continues to report pain this morning though overall reasonably controlled. He says he's unsure why he may have been confused prior to admission but feels much better from this perspective now.    Objective:    Vital signs in last 24 hours:  Temp:  [36.6 ??C-37.3 ??C] 36.9 ??C  Heart Rate:  [96-102] 100  SpO2 Pulse:  [99-102] 102  Resp:  [15-20] 15  BP: (134-159)/(84-98) 148/90  MAP (mmHg):  [102] 102  SpO2:  [96 %-98 %]  97 %  BMI (Calculated):  [28.81] 28.81    Intake/Output last 3 shifts:  I/O last 3 completed shifts:  In: 1100 [I.V.:1100]  Out: 300 [Urine:300]  Intake/Output this shift:  I/O this shift:  In: 120 [P.O.:120]  Out: -     Physical Exam:  Vitals:    05/06/18 1257   BP: 148/90   Pulse: 100   Resp: 15   Temp: 36.9 ??C   SpO2: 97%     General: Alert, no acute distress, mildly uncomfortable in bed  HEENT: Extraocular movements intact, sclerae anicteric  Respiratory: Course breaths bilaterally, nonlabored respirations, [place] in place  Cardiovascular: Mild tachycardia, no murmurs, rubs, or gallops, nondisplaced PMI  Abdominal: Soft, nondistended, nontender, normoactive bowel sounds  Extremities: No peripheral edema, no clubbing  Neuro: A&Ox3, Cranial nerves II through XII grossly intact  Psych: Appropriate mood and affect    Labs/Studies:  Labs and Studies from the last 24hrs per EMR and Reviewed      [name]  Internal Medicine Resident, PGY-1  05/06/2018 2:04 PM

## 2018-05-06 NOTE — Unmapped (Signed)
Care Management  Initial Transition Planning Assessment    Per team palliative care will meet with pt for GOC discussion.  CM will meet with pt after to determine d/c needs.                General  Care Manager assessed the patient by : Medical record review, Discussion with Clinical Care team  Orientation Level: Oriented X4    Contact/Decision Maker        Extended Emergency Contact Information  Primary Emergency Contact: Fredrik Rigger [place]  Home Phone: [phone]  Relation: Friend  Secondary Emergency Contact: [name]  Mobile Phone: [phone]  Relation: Friend    Legal Next of Kin / Guardian / POA / Advance Directives     HCDM (patient stated preference): Nikki Dom [phone]    HCDM (patient stated preference): [name] - Friend - [phone]    Advance Directive (Medical Treatment)  Does patient have an advance directive covering medical treatment?: Patient does not have advance directive covering medical treatment., Patient would like information.  Information provided on advance directive:: Yes  Patient requests assistance:: No    Advance Directive (Mental Health Treatment)  Does patient have an advance directive covering mental health treatment?: Patient does not have advance directive covering mental health treatment., Patient would not like information.  Reason patient does not have an advance directive covering mental health treatment:: Patient does not wish to complete one at this time.    Patient Information  Lives with: Friends    Type of Residence: Private residence        Location/Detail: [place], [place]    Support Systems: Friends/Neighbors    Responsibilities/Dependents at home?: No    Home Care services in place prior to admission?: Yes  Type of Home Care services in place prior to admission: Home health (specify)  Current Home Care provider (Name/Phone #): [facility] RN,PT,OT             Equipment Currently Used at Home: wheelchair, manual, hospital bed, walker, rolling, oxygen       Currently receiving outpatient dialysis?: No       Financial Information       Need for financial assistance?: No       Discharge Needs Assessment  Concerns to be Addressed: adjustment to diagnosis/illness, care coordination/care conferences    Clinical Risk Factors: > 65, Principal Diagnosis: Cancer, Stroke, COPD, Heart Failure, AMI, Pneumonia, Joint Replacment    Barriers to taking medications: No    Prior overnight hospital stay or ED visit in last 90 days: Yes    Readmission Within the Last 30 Days: current reason for admission unrelated to previous admission         Anticipated Changes Related to Illness: inability to care for self         Discharge Facility/Level of Care Needs:      Readmission  Risk of Unplanned Readmission Score: UNPLANNED READMISSION SCORE: 42%  Readmitted Within the Last 30 Days? Yes  Patient at risk for readmission?: Yes    Discharge Plan  Screen findings are: Discharge planning needs identified or anticipated (Comment).    Expected Discharge Date: 05/08/18    Expected Transfer from Critical Care:      Patient and/or family were provided with choice of facilities / services that are available and appropriate to meet post hospital care needs?: Yes   List choices in order highest to lowest preferred, if applicable. : will depend  on GOC    Initial Assessment complete?: Yes

## 2018-05-06 NOTE — Unmapped (Signed)
Medicine History and Physical    Assessment/Plan:    Principal Problem:    Encephalopathy  Active Problems:    Metastatic renal cell carcinoma to bone (CMS-HCC)    Substance abuse (CMS-HCC)    Cancer related pain    Essential hypertension    Pancytopenia (CMS-HCC)    Shortness of breath    Tobacco use disorder  Resolved Problems:    * No resolved hospital problems. *    Russell [name] is a 57 y.o. male with PMHx of COPD, polysubstance abuse, metastatic RCC (currently on everolimus and lenvatinib) who presented to [facility] with uncontrolled pain, continued shortness of breath and cough.    Metastatic RCC - Uncontrolled Pain:  Patient has diffuse mets (bone, lung, LN), and has progressed on first and second line therapy chemotherapy, currently he is on Everolimus and Lenvatinib.  Primary oncologist is Dr. Rod Can.  Patient has a history of difficult to control pain, with a recent admission to 4 Onc for uncontrolled pain. Patient follows with Dr. Legrand Como for pain mgmt. His friend [name] and mother also feel like since being on the methadone he has been more groggy, with intermittent confusion, and often time is slower to respond. They had changed his Methadone dosing from 10 TID to 15 mg BID and felt like his grogginess was better on this schedule. Per [name], he has not had any acute changes in mental status, but since his prior hospitalization in May, he has been sleeping more, been less interactive, and had more intermittent confusion. They are overall frustrated with his uncontrolled pain, but also are frustrated with the grogginess associated with the pain meds. They have been discussing and considering hospice as an option, and the patient is open to the idea of hospice.  -- Palliative care consult  -- Continued goals of care discussion   -- Tylenol 1000 mg TID  -- Home Methadone 15 mg BID  -- Home Oxycodone 30 mg q4h PRN  -- Gabapentin - will decrease from home dose of 900 mg TID to 300 mg TID  -- Voltaren Gel PRN, Lidocaine Patch    PNA:  Patient currently being treated with a 10 day course of Augmentin (started on 5/28 during prior hospitalization)  for dyspnea, dry cough, new oxygen requirement of 2L. He continues to have cough, dyspnea, and low grade fevers at home. He is stable on home 2L of oxygen by nasal cannula and afebrile in the ED.  Lactate wnl.  CXR on admission showed increasing biapical opacifications compared to prior, possibly infectious.  -- Levaquin 750 mg daily x 7 days    Pancytopenia: Hemoglobin and platelets both low but stable compared to prior.    -- Type and Screen, Consent for blood products obtained  -- Transfuse for Hgb <7, Plts <10    Tobacco Use: Nicotine patch and nicotine gum PRN    HTN: Continue home Amlodipine    Elevated Troponin: Troponin initially elevated in the ED, but multiple samples hemolyzed.  Non-hemolyzed specimen 0.067.  Patient with no chest pain.  EKG with no ischemic changes.  Likely 2/2 demand.  Troponin previously elevated during prior admissions from 0.066-0.095  -- Repeat trop q6h until down-trending or plateau  -- Repeat EKG in the AM with next trop    FEN/GI/Proph:  -- No IVF, replace lytes PRN, regular diet  -- home PPI, Lovenox [place]    Code Status: Full Code, confirmed with patient on admission  ___________________________________________________________________    Chief Complaint:  Chief Complaint   Patient presents with   ??? Shortness of Breath     Encephalopathy    HPI:  Russell [name] is a 57 y.o. male with PMHx of COPD, polysubstance abuse, metastatic RCC who presented to [facility] with altered mental status, pain, shortness of breath.    The patient was recently admitted to the E2 service from 04/27/2018 to 04/29/2018 for poorly controlled malignant pain.  He also had a fever during that admission with shortness of breath and a new oxygen requirement of 2L via nasal cannula.  He was treated with Augmentin for a 10 day course, which he is still taking.  He was discharged with 2L of oxygen by nasal cannula.  The patient follows with Dr. Legrand Como in the pain clinic and his home regimen is methadone 10 mg TID, Oxycodone 30 mg q4h PRN, Tylenol PRN, and Gabapentin.    Yesterday, the patient's friend [name] called in with concern that the patient was having worse pain and also with increased confusion.[facility] has been discussed by the patient's primary oncologist, Dr. Legrand Como, and the patient's mother and friend.  The patient's mother did not feel like the patient would want hospice currently.  Given his altered mental status and uncontrolled pain, they recommend the patient come into the hospital for further evaluation and for more discussion about goals of care and hospice.     [name] (patient's friend) feels like the patient has had more intermittent confusion, has been sleeping more, and has more grogginess where he sometimes is slow to respond to questions or will lose his train of thought since his hospitalization in May. She also feels like his mental status was better when he was on the morphine in the past, and has worsened since switching to Methadone.  She was hoping to discuss his pain regimen more this admission. She says she really would prefer if he could stop the methadone and switch to another long-acting medication.  The patient also continues to have difficult to control pain and often moans and groans at home.  He has diffuse abdominal pain, bilateral knee pain, hip pain, and arm pain.  Since his discharge, he also continued to have a dry cough, intermittent dyspnea, and [name] reports low-grade fevers at home.  He says he has had a cough for a long time, it has been worsening since his last admission.  He had a coughing spell a few days ago that lasted several minutes, but he never coughed anything up.     In the ED: Patient was afebrile, he was mildly tachycardic in the low 100s, BP was within normal limits, O2 saturation > 95% on home 2L of oxygen by nasal cannula. Labs showed a hemoglobin of 7, Platelets of 84, WBC of 3.5 (ANC 1.6), Na 137, K 4.6, Creatinine 0.43, Lipase 64, Pro-BNP 457 (previous 522), Troponin elevated at 0.074, on repeat 0.065 (but BOTH samples hemolyzed), Lactate 1.1. Urinalysis with trace protein, non-infectious appearing, CXR with increased biapical opacifications, c/f infection. He was given 1L of normal saline, blood cultures were obtained, and he was started on Levaquin from pneumonia.  By my assessment, he was awake, alert, oriented to person, place, year, and situation, with mental status the same as my previous encounter with the patient during his last admission.  He was intermittently moaning throughout the interview due to pain, which according to [name] is common for him to do even at home.      Allergies:  Patient has no  known allergies.    Medications:   Prior to Admission medications    Medication Dose, Route, Frequency   acetaminophen (TYLENOL) 500 MG tablet 1,000 mg, Oral, 3 times a day   albuterol (PROVENTIL HFA;VENTOLIN HFA) 90 mcg/actuation inhaler 2 puffs, Inhalation, Every 4 hours PRN   amLODIPine (NORVASC) 5 MG tablet 5 mg, Oral, Daily (standard)   amoxicillin-clavulanate (AUGMENTIN) 875-125 mg per tablet 1 tablet, Oral, Every 12 hours scheduled   dexamethasone (DECADRON) 0.5 mg/5 mL solution 1 mg, Oral, 4 times a day, Swish and spit.   diclofenac sodium (VOLTAREN) 1 % gel 2 g, Topical, 4 times a day   everolimus, antineoplastic, (AFINITOR) 5 mg tablet 5 mg, Oral, Daily (standard)   gabapentin (NEURONTIN) 300 MG capsule 900 mg, Oral, 3 times a day (standard)   HELIUM-OXYGEN INHL 2 L, Each Nare, Continuous   ibuprofen (ADVIL) 200 MG tablet 200 mg, Oral, Every 8 hours PRN   ibuprofen (ADVIL,MOTRIN) 600 MG tablet 600 mg, Oral, Every 8 hours PRN   lenvatinib 18 mg/day (10 mg x 1-4 mg x2) cap 18 mg, Oral, Daily (standard)   lidocaine (LIDODERM) 5 % patch 1 patch, Transdermal, Every 24 hours, Apply to affected area for 12 hours only each day (then remove patch)    methadone (DOLOPHINE) 10 MG tablet 10 mg, Oral, Every 8 hours, Fill on or after 04/23/18   naloxone (NARCAN) 4 mg nasal spray One spray in either nostril once for known/suspected opioid overdose. May repeat every 2-3 minutes in alternating nostril til EMS arrives   nicotine (NICODERM CQ) 14 mg/24 hr patch 1 patch, Transdermal, Daily (standard)   nicotine polacrilex (NICORETTE) 2 mg gum 2 mg, Buccal, Every 1 hour prn  Patient not taking: Reported on 05/01/2018   omeprazole (PRILOSEC) 20 MG capsule 20 mg, Oral, Daily (standard), TAKE 1 CAPSULE (20 MG TOTAL) BY MOUTH DAILY.   ondansetron (ZOFRAN-ODT) 4 MG disintegrating tablet 8 mg, Oral, Every 8 hours PRN   oxyCODONE (ROXICODONE) 30 MG immediate release tablet 30 mg, Oral, Every 4 hours PRN, Fill on or after 04/23/18   predniSONE (DELTASONE) 5 MG tablet 30 mg 5/17, 15 mg x3 days, 10 mg x3 days, 5mg  x3 days,  Patient not taking: Reported on 05/01/2018   tiotropium (SPIRIVA WITH HANDIHALER) 18 mcg inhalation capsule 18 mcg, Inhalation   urea (CARMOL) 40 % cream 1 application, Topical, 2 times a day (standard), To hands and feet.       Medical History:  Past Medical History:   Diagnosis Date   ??? Bone metastasis (CMS-HCC)    ??? Renal cancer (CMS-HCC)        Surgical History:  Past Surgical History:   Procedure Laterality Date   ??? FLEXIBLE BRONCHOSCOPY  07/24/2017   ??? HERNIA REPAIR         Social History:  Social History     Socioeconomic History   ??? Marital status: Single     Spouse name: Not on file   ??? Number of children: Not on file   ??? Years of education: Not on file   ??? Highest education level: Not on file   Occupational History   ??? Not on file   Social Needs   ??? Financial resource strain: Not on file   ??? Food insecurity:     Worry: Not on file     Inability: Not on file   ??? Transportation needs:     Medical: Not on file     Non-medical:  Not on file   Tobacco Use   ??? Smoking status: Current Every Day Smoker     Packs/day: 1.00     Types: Cigarettes   ??? Smokeless tobacco: Never Used   ??? Tobacco comment: Currently less than a quarter pack due to drowsiness from meds   Substance and Sexual Activity   ??? Alcohol use: Yes   ??? Drug use: Yes     Types: Cocaine   ??? Sexual activity: Not on file   Lifestyle   ??? Physical activity:     Days per week: Not on file     Minutes per session: Not on file   ??? Stress: Not on file   Relationships   ??? Social connections:     Talks on phone: Not on file     Gets together: Not on file     Attends religious service: Not on file     Active member of club or organization: Not on file     Attends meetings of clubs or organizations: Not on file     Relationship status: Not on file   Other Topics Concern   ??? Not on file   Social History Narrative   ??? Not on file       Family History:  Family History   Problem Relation Age of Onset   ??? Brain cancer Paternal Uncle        Review of Systems:  10 systems reviewed and are negative unless otherwise mentioned in HPI    Labs/Studies:  Labs and Studies from the last 24hrs per EMR and Reviewed    Physical Exam:  Temp:  [37.2 ??C-37.3 ??C] 37.2 ??C  Heart Rate:  [96-103] 96  SpO2 Pulse:  [99-103] 102  Resp:  [14-21] 18  BP: (134-159)/(84-98) 159/98  SpO2:  [92 %-97 %] 96 %    GEN: NAD, lying in bed, awake, alert, oriented to person, place, year, month, intermittently moans in pain, winces, and shifts in bed, but then will appear comfortable for awhile again.  He is awake, slower to respond at times, but answers all my questions, and made a few jokes. His friend [name] does most of the talking.  EYES: EOMI, sclerae anicteric, no rhinorrhea  ENT: dry mucous membranes, atraumatic, no LAD  CV: mildly tachy, regular, no murmurs appreciated  PULM: course breath sounds throughout, no wheezes, breathing comfortably on 2L of oxygen by nasal cannula with O2 sats > 95%.   ABD: soft, NT/ND, +BS  EXT: No edema  NEURO: No focal deficits  PSYCH: A+Ox3, appropriate  GU: No CVA tenderness  MSK: No spinal tenderness

## 2018-05-06 NOTE — Unmapped (Signed)
Patient rounds completed. The following patient needs were addressed:  Pain, Toileting, Positioning;   SUPINE, Personal Belongings, Plan of Care, [place] in Reach and Bed Position Low .

## 2018-05-07 ENCOUNTER — Ambulatory Visit: Admit: 2018-05-07 | Discharge: 2018-05-12 | Payer: MEDICAID | Attending: Internal Medicine | Primary: Internal Medicine

## 2018-05-07 ENCOUNTER — Ambulatory Visit: Admit: 2018-05-07 | Discharge: 2018-05-12 | Payer: MEDICAID

## 2018-05-07 DIAGNOSIS — G893 Neoplasm related pain (acute) (chronic): Secondary | ICD-10-CM

## 2018-05-07 DIAGNOSIS — F17210 Nicotine dependence, cigarettes, uncomplicated: Secondary | ICD-10-CM

## 2018-05-07 DIAGNOSIS — Z9981 Dependence on supplemental oxygen: Secondary | ICD-10-CM

## 2018-05-07 DIAGNOSIS — C7951 Secondary malignant neoplasm of bone: Principal | ICD-10-CM

## 2018-05-07 DIAGNOSIS — C779 Secondary and unspecified malignant neoplasm of lymph node, unspecified: Secondary | ICD-10-CM

## 2018-05-07 DIAGNOSIS — C649 Malignant neoplasm of unspecified kidney, except renal pelvis: Secondary | ICD-10-CM

## 2018-05-07 DIAGNOSIS — F1410 Cocaine abuse, uncomplicated: Secondary | ICD-10-CM

## 2018-05-07 DIAGNOSIS — D61818 Other pancytopenia: Secondary | ICD-10-CM

## 2018-05-07 DIAGNOSIS — J449 Chronic obstructive pulmonary disease, unspecified: Secondary | ICD-10-CM

## 2018-05-07 DIAGNOSIS — Z7952 Long term (current) use of systemic steroids: Secondary | ICD-10-CM

## 2018-05-07 DIAGNOSIS — C7801 Secondary malignant neoplasm of right lung: Secondary | ICD-10-CM

## 2018-05-07 LAB — MANUAL DIFFERENTIAL
BASOPHILS - ABS (DIFF): 0 10*9/L (ref 0.0–0.1)
BASOPHILS - REL (DIFF): 0 %
LYMPHOCYTES - ABS (DIFF): 1.4 10*9/L — ABNORMAL LOW (ref 1.5–5.0)
LYMPHOCYTES - REL (DIFF): 35 %
MONOCYTES - ABS (DIFF): 0.4 10*9/L (ref 0.2–0.8)
NEUTROPHILS - ABS (DIFF): 2.2 10*9/L (ref 2.0–7.5)
NEUTROPHILS - REL (DIFF): 56 %

## 2018-05-07 LAB — BASIC METABOLIC PANEL
ANION GAP: 11 mmol/L (ref 9–15)
BLOOD UREA NITROGEN: 14 mg/dL (ref 7–21)
BUN / CREAT RATIO: 29
CALCIUM: 7.8 mg/dL — ABNORMAL LOW (ref 8.5–10.2)
CHLORIDE: 101 mmol/L (ref 98–107)
CO2: 24 mmol/L (ref 22.0–30.0)
CREATININE: 0.49 mg/dL — ABNORMAL LOW (ref 0.70–1.30)
EGFR MDRD AF AMER: >=60 mL/min/{1.73_m2} (ref >=60)
EGFR MDRD NON AF AMER: >=60 mL/min/{1.73_m2} (ref >=60)
GLUCOSE RANDOM: 80 mg/dL (ref 65–179)
SODIUM: 136 mmol/L (ref 135–145)

## 2018-05-07 LAB — CBC W/ AUTO DIFF
HEMOGLOBIN: 7.7 g/dL — ABNORMAL LOW (ref 13.5–17.5)
MEAN CORPUSCULAR HEMOGLOBIN CONC: 31.4 g/dL (ref 31.0–37.0)
MEAN CORPUSCULAR VOLUME: 83.6 fL (ref 80.0–100.0)
MEAN PLATELET VOLUME: 9.1 fL (ref 7.0–10.0)
NUCLEATED RED BLOOD CELLS: 14 /100{WBCs} — ABNORMAL HIGH (ref <=4)
PLATELET COUNT: 64 10*9/L — ABNORMAL LOW (ref 150–440)
RED BLOOD CELL COUNT: 2.93 10*12/L — ABNORMAL LOW (ref 4.50–5.90)
RED CELL DISTRIBUTION WIDTH: 17.7 % — ABNORMAL HIGH (ref 12.0–15.0)
WBC ADJUSTED: 3.9 10*9/L — ABNORMAL LOW (ref 4.5–11.0)

## 2018-05-07 LAB — MEAN CORPUSCULAR HEMOGLOBIN CONC: Lab: 31.4

## 2018-05-07 LAB — CHLORIDE: Chloride:SCnc:Pt:Ser/Plas:Qn:: 101

## 2018-05-07 LAB — MAGNESIUM: Magnesium:MCnc:Pt:Ser/Plas:Qn:: 2

## 2018-05-07 LAB — LYMPHOCYTES - ABS (DIFF): Lab: 1.4 — ABNORMAL LOW

## 2018-05-07 NOTE — Unmapped (Signed)
Tachycardic tonight, HR peaked at 116, no c/o chest discomfort or SOB. Pt was in pain at the time of increased HR, PRN dilaudid given X3 in this shift. Pt was able to rest better on this regimen in compare to yesterday. Oddly, pt had urine incontinent episode this morning. He did not realized that he was wet or had any clue that this had happened. Pt bladder scanned prior to incontinence care and had 154 ml in his bladder. Per pt this is new problem. WCTM and provide intervention as appropriate.

## 2018-05-07 NOTE — Unmapped (Addendum)
[ ]   pain management with hospice and palliative care, consider transitioning to fentanyl patch as needed  [ ]  adjustment of lactulose as needed with goal of daily BM    Russell [name]??is a 57 y.o.??male??with PMHx of COPD, polysubstance abuse, metastatic RCC (currently on everolimus and lenvatinib)??who presented to [facility] with??uncontrolled pain, continued shortness of breath and cough.  ??  Metastatic RCC - Uncontrolled Pain:????Patient has diffuse mets (bone, lung, LN), and has progressed on first and second line therapy chemotherapy, currently he is on Everolimus and Lenvatinib. ??Primary oncologist is Dr. Rod Can. ??Patient has a history of difficult to control pain, with a recent admission to 4 Onc for uncontrolled pain. Patient follows with Dr. Winzelberg??for pain mgmt.??Patient and family have been discussing and considering hospice as an option, and the patient is open to the idea of hospice. Seen by radiation oncology who performed palliative therapy on 6/7 to the hip. Pain regimen adjusted per palliative team, discharged with home methadone 15mg  BID, increased oxycodone to 30mg  q2h as needed, decreased gabapentin to 300mg  TID, and continued Voltaren gel and Lidocaine patches. Decision made to focus on managing patient's pain without further cancer-directed therapies during admission and discharged with home hospice. See Dr. Molli Hazard ACP note during admission.    Constipation: No bowel movement for several days. Increased bowel regimen to lactulose TID with senna nightly.  ??  PNA:????Patient??currently being treated with a 10??day course of Augmentin (started on 5/28 during prior hospitalization)????for dyspnea, dry cough,??new oxygen requirement of 2L.??He continues to have cough, dyspnea, and low grade fevers at home. He??is stable on home 2L of oxygen by nasal cannula??and afebrile in the ED. ??Lactate wnl. ??CXR on admission showed increasing biapical opacifications compared to prior, possibly infectious. Treated with Levaquin??7 days (6/4-6/10).  ??  Pancytopenia:??Hemoglobin and platelets both low but stable compared to prior. ??No transfusions needed during admission.  ??  Substance, tobacco use: Reportedly groggy/confused prior to admission. Consistently cocaine positive on utox in the past, negative on check 2nd day of admission except for opiates and methadone.  ??  HTN: Continued home Amlodipine  Elevated Troponin, downtrended: Troponin initially elevated in the ED, but multiple samples hemolyzed. ??Patient with no chest pain. ??EKG with no ischemic changes. ??Likely 2/2 demand and likely cocaine abuse.

## 2018-05-07 NOTE — Unmapped (Signed)
Pt is A & O x 4. Clear breath sounds bilaterally. Pain medicine given for L hip pain. I/O cath completed for urine sample per MD order. XRT simulation completed today. Afebrile throughout shift, VSS. No falls or injuries. WCM.      Problem: Adult Inpatient Plan of Care  Goal: Plan of Care Review  Outcome: Progressing  Goal: Patient-Specific Goal (Individualization)  Outcome: Progressing  Goal: Absence of Hospital-Acquired Illness or Injury  Outcome: Progressing  Goal: Optimal Comfort and Wellbeing  Outcome: Progressing  Goal: Readiness for Transition of Care  Outcome: Progressing  Goal: Rounds/Family Conference  Outcome: Progressing     Problem: Fall Injury Risk  Goal: Absence of Fall and Fall-Related Injury  Outcome: Progressing     Problem: Self-Care Deficit  Goal: Improved Ability to Complete Activities of Daily Living  Outcome: Progressing     Problem: Pain Acute  Goal: Optimal Pain Control  Outcome: Progressing     Problem: Hypertension Comorbidity  Goal: Blood Pressure in Desired Range  Outcome: Progressing

## 2018-05-07 NOTE — Unmapped (Signed)
Pt is A & O x 4. Pt c/o increased bilateral leg pain, MD notified, changes in pain regimen made, see EMAR. Pt states not having a BM since 6/3,  Lactulose and Miralax given. XRT simulation completed today, IV Dilaudid given prior to procedure. Afebrile throughout shift, VSS. No falls or injuries. WCM.      Problem: Adult Inpatient Plan of Care  Goal: Plan of Care Review  Outcome: Progressing  Goal: Patient-Specific Goal (Individualization)  Outcome: Progressing  Goal: Absence of Hospital-Acquired Illness or Injury  Outcome: Progressing  Goal: Optimal Comfort and Wellbeing  Outcome: Progressing  Goal: Readiness for Transition of Care  Outcome: Progressing  Goal: Rounds/Family Conference  Outcome: Progressing     Problem: Fall Injury Risk  Goal: Absence of Fall and Fall-Related Injury  Outcome: Progressing     Problem: Self-Care Deficit  Goal: Improved Ability to Complete Activities of Daily Living  Outcome: Progressing     Problem: Pain Acute  Goal: Optimal Pain Control  Outcome: Progressing     Problem: Hypertension Comorbidity  Goal: Blood Pressure in Desired Range  Outcome: Progressing

## 2018-05-07 NOTE — Unmapped (Signed)
Daily Progress Note    Late morning addendum/update: Patient and HCPOA met with palliative care team this morning and decision made to plan to go home with hospice following this hospitalization. Resuming home oxy 30 and increasing frequency to 30mg  q2h prn given poor pain control today. Planning for palliative radiotherapy planning session today and treatments the next two days for severe hip pain.    Assessment/Plan:    Principal Problem:    Encephalopathy  Active Problems:    Metastatic renal cell carcinoma to bone (CMS-HCC)    Substance abuse (CMS-HCC)    Cancer related pain    Essential hypertension    Pancytopenia (CMS-HCC)    Shortness of breath    Tobacco use disorder    Russell [name] is a 57 y.o. male with PMHx of COPD, polysubstance abuse, metastatic RCC (currently on everolimus and lenvatinib) who presented to [facility] with uncontrolled pain, continued shortness of breath and cough.  ??  Metastatic RCC - Uncontrolled Pain:  Patient has diffuse mets (bone, lung, LN), and has progressed on first and second line therapy chemotherapy, currently he is on Everolimus and Lenvatinib.  Primary oncologist is Dr. Rod [name].  Patient has a history of difficult to control pain, with a recent admission to 4 Onc for uncontrolled pain. Patient follows with Dr. Legrand [name] for pain mgmt. Patient and family have been discussing and considering hospice as an option, and the patient is open to the idea of hospice.   -- Unable to get CT simulation 2/2 pain and movement yesterday with radiation oncology, will attempt again today with additional prn pain meds as needed. Anticipate palliative radiation starting tomorrow for bony mets.  -- Palliative care consult for pain and continued goals of care discussion   -- Adjusted pain regimen:Tylenol 1000 mg TID, decreased home Methadone to 10 mg BID, switched home Oxycodone 30 mg q4h PRN to Dilaudid 6mg  po q4h prn per palliative recs. Further adjustment today as needed.  -- Decrease gabapentin from home dose of 900 mg TID to 300 mg TID  -- Voltaren Gel PRN, Lidocaine Patch    Urinary incontinence: Newly developed overnight, patient reports no prior issues. Bladder scan after episode WNL. May be medication related given adjustments in regimen.  - CTM  ??  PNA:  Patient currently being treated with a 10 day course of Augmentin (started on 5/28 during prior hospitalization)  for dyspnea, dry cough, new oxygen requirement of 2L. He continues to have cough, dyspnea, and low grade fevers at home. He is stable on home 2L of oxygen by nasal cannula and afebrile in the ED.  Lactate wnl.  CXR on admission showed increasing biapical opacifications compared to prior, possibly infectious.  -- Levaquin 750 mg daily x 7 days (6/4-)  ??  Pancytopenia: Hemoglobin and platelets both low but stable compared to prior.    -- Type and Screen, Consent for blood products obtained  -- Transfuse for Hgb <7, Plts <10  ??  Substance, tobacco use: Reportedly groggy/confused prior to admission. Consistently cocaine positive on utox in the past, however negative on urine check day 2 of admission.  - Nicotine patch and nicotine gum PRN  ??  HTN: Continue home Amlodipine  ??  Elevated Troponin, downtrended: Troponin initially elevated in the ED, but multiple samples hemolyzed.  Patient with no chest pain.  EKG with no ischemic changes.  Likely 2/2 demand and likely cocaine abuse.  Troponin previously elevated during prior admissions from 0.066-0.095  ??  FEN/GI/Proph:  --  No IVF, replace lytes PRN, regular diet  -- home PPI, Lovenox [place]  ??  Code Status: Full Code, confirmed with patient on admission     LOS: 2 days     Subjective:    Continues to have hip pain but slept well overnight. Episode of urinary incontinence, puzzling to patient given no episodes in the past. Asking for voltaren gel this morning.    Objective:    Vital signs in last 24 hours:  Temp:  [36.7 ??C-37.5 ??C] 37 ??C  Heart Rate:  [96-116] 110  Resp:  [15-22] 22  BP: (130-150)/(90-98) 130/95  FiO2 (%):  [2 %] 2 %  SpO2:  [94 %-97 %] 95 %    Intake/Output last 3 shifts:  I/O last 3 completed shifts:  In: 695 [P.O.:695]  Out: 100 [Urine:100]  Intake/Output this shift:  No intake/output data recorded.    Physical Exam:  Vitals:    05/07/18 0850   BP: 130/95   Pulse:    Resp:    Temp:    SpO2:      General: Alert, no acute distress, mildly uncomfortable in bed  HEENT: Extraocular movements intact, sclerae anicteric  Respiratory: Coarse breaths bilaterally, nonlabored respirations, [place] in place  Cardiovascular: Mild tachycardia, no murmurs, rubs, or gallops, nondisplaced PMI  Abdominal: Soft, nondistended, nontender, normoactive bowel sounds  Extremities: No peripheral edema, no clubbing  Neuro: A&Ox3, Cranial nerves II through XII grossly intact  Psych: Appropriate mood and affect    Labs/Studies:  Labs and Studies from the last 24hrs per EMR and Reviewed      [name]  Internal Medicine Resident, PGY-1  05/07/2018 9:12 AM

## 2018-05-07 NOTE — Unmapped (Signed)
Palliative Care Consult Note    Consultation from Requesting Attending Physician:  Olivia Mackie, MD  Service Requesting Consult:  Oncology/Hematology (MDE)  Reason for Consult Request from Attending Physician:  Evaluation of Symptoms and Goals of Care / Decision Making  Primary Care Provider:  [facility]    Code Status: Full Code  Advance Directive Status: HCPOA- [place] Expose, saw completed form but not in Epic. Will have scanned in system.    Assessment/Recommendation:   SUMMARY: Russell [name] is an 57 y.o. M with metastatic RCC with ongoing issues with pain control and  progressive decline including weakness, anorexia and confusion.    1. Symptom Assessment and Recommendations:      # Cancer-Related Pain: challenging to manage for multiple reasons including extensive disease, prior issues with consistently taking regimen, and ongoing cocaine use. Friends and family feel that methadone is primary reason for ongoing confusion and while it is probably contributing, I worry that decline from cancer is also a significant component. Will attempt to lower opioids and likely transition to new regimen to see if any improvement in mental status. Continue to express concern about cocaine use and how that will also affect his mental status on these medications.  - decrease methadone to 10 mg BID  - trial of dilaudid 6 mg PO q4hr prn  - discontinue oxycodone 30 mg PO prn  - will consider transition to fentanyl patch    # Confusion/Delirium: multifactorial- metastatic cancer, cocaine use, opioids and ongoing pain issues. Will likely attempt a transition in opioid regimen but have expressed to friends that I'm not sure how much it will improve his confusion.    2. Communication and ACP:  -- Prognosis and Understanding: Not discussed in detail today. Will get more information from oncologist.     -- Goals of Care and Decision Making:   Decision Maker at Visit  Patient likely able to state preferences currently but memory seems poor and falls asleep easily so will need support of HCPOA in decision making.    Mr. Stein not present in room during my initial visit. I spoke with his [place] and friend [name] who he lives with. They continue to express disbelief that he could be using cocaine despite multiple pos utoxes. Aurther Loft feels that if there is away that he could be less confused and have more time that he would want that. They also acknowledge that he is very scared of dying and that may be contributing to his sleeping more. We discussed the multiple factors contributing to his decline. Hospice has been discussed briefly with them and are consider for support but have not really weighed what it will mean for him to choose not to do treatment.     3. Support / Other Communication and Counseling Topics:    Palliative care visit today included focused interview, active listening, therapeutic use of silence, offering support, sharing empathy and summarization of today's discussion as well as goals of care.    Will plan to meet with Aurther Loft, Mr. Cartmell and his mother tomorrow 6/6 around 10 am to explore goals in more detail.      Thank you for this consult. Please page  Darrin Nipper  or Palliative Care [phone]) if there are any questions.     Subjective:    History of Present Illness:      Russell [name] is an 57 y.o. M with metastatic RCC with worsening pain, progressive functional decline, anorexia and confusion.  He was in pain when I saw him and unable to recall much of his history. Most of his history is from friend at bedside. He has had severe pain in Left hip and down his left thigh. He presented to [facility] in severe pain after a transition to methadone. He was transferred to [facility] and methadone was uptitrated to 15 mg TID. On discharge friends were concerned about sedation and it was decreased to 10 mg TID. He continued to get weaker and increasingly more confused. He has had two brief hospitalizations in past 2 weeks. The first was for AMS which resolved fairly quickly and was thought to be due to cocaine use, u tox was positive. He left AMA but returned 2 days later with SOB. He was set up with O2 and discharged. U tox at that time was negative for cocaine. He was seen in outpatient pall care and u tox again showed cocaine although he denied. Rehab was discussed and he declined. He has continued to be more confused. He is sleeping most of the time and having difficulty getting out of bed. He has not been eating or drinking much. He had a fever and was evaluated by EMS but not brought in. He came to the ED the next day for this admission.     Allergies:  No Known Allergies    Current Facility-Administered Medications   Medication Dose Route Frequency Provider Last Rate Last Dose   ??? acetaminophen (TYLENOL) tablet 1,000 mg  1,000 mg Oral TID Clance Boll, MD   1,000 mg at 05/06/18 1244   ??? albuterol (PROVENTIL HFA;VENTOLIN HFA) 90 mcg/actuation inhaler 2 puff  2 puff Inhalation Q4H PRN Clance Boll, MD       ??? amLODIPine (NORVASC) tablet 5 mg  5 mg Oral Daily Clance Boll, MD   5 mg at 05/06/18 0819   ??? diclofenac sodium (VOLTAREN) 1 % gel 2 g  2 g Topical 4x Daily Clance Boll, MD   2 g at 05/06/18 0549   ??? enoxaparin (LOVENOX) syringe 40 mg  40 mg Subcutaneous Q24H Clance Boll, MD   40 mg at 05/05/18 2313   ??? gabapentin (NEURONTIN) capsule 300 mg  300 mg Oral TID Clance Boll, MD   300 mg at 05/06/18 1555   ??? HYDROmorphone (DILAUDID) tablet 6 mg  6 mg Oral Q4H PRN [name], MD   6 mg at 05/06/18 1556   ??? ibuprofen (ADVIL,MOTRIN) tablet 200 mg  200 mg Oral Q8H PRN Clance Boll, MD   200 mg at 05/05/18 2314   ??? levofloxacin (LEVAQUIN) 750 mg/150 mL IVPB 750 mg  750 mg Intravenous Q24H Clance Boll, MD 100 mL/hr at 05/06/18 1244 750 mg at 05/06/18 1244   ??? lidocaine (LIDODERM) 5 % patch 1 patch  1 patch Transdermal Q24H Clance Boll, MD   1 patch at 05/05/18 2117   ??? melatonin tablet 3 mg  3 mg Oral Nightly PRN Clance Boll, MD       ??? methadone (DOLOPHINE) tablet 10 mg  10 mg Oral BID [name], MD       ??? nicotine (NICODERM CQ) 14 mg/24 hr patch 1 patch  1 patch Transdermal Daily Clance Boll, MD   1 patch at 05/06/18 0821   ??? nicotine polacrilex (NICORETTE) gum 2 mg  2 mg Buccal Q1H PRN Clance Boll, MD       ???  ondansetron (ZOFRAN-ODT) disintegrating tablet 8 mg  8 mg Oral Q8H PRN Clance Boll, MD       ??? pantoprazole (PROTONIX) EC tablet 20 mg  20 mg Oral Daily Clance Boll, MD   20 mg at 05/06/18 0819   ??? polyethylene glycol (MIRALAX) packet 17 g  17 g Oral Daily PRN Clance Boll, MD   17 g at 05/05/18 2315   ??? senna (SENOKOT) tablet 1 tablet  1 tablet Oral Nightly Clance Boll, MD   1 tablet at 05/05/18 2315   ??? tiotropium (SPIRIVA) 18 mcg inhalation capsule 18 mcg  18 mcg Inhalation Daily (RT) Clance Boll, MD   18 mcg at 05/06/18 1610       Past Medical History:   Diagnosis Date   ??? Bone metastasis (CMS-HCC)    ??? Renal cancer (CMS-HCC)        Past Surgical History:   Procedure Laterality Date   ??? FLEXIBLE BRONCHOSCOPY  07/24/2017   ??? HERNIA REPAIR         Social History and Social/Spiritual Support: He has been living with his friend [name]. Will spend some days at his mother's who is a few doors down.    Family History:   family history includes Brain cancer in his paternal uncle.    Review of Systems:  Review of systems was unobtainable due to patient factors.    Objective:     Function:  50% - Ambulation: Mainly sit/lie / Unable to do any work, extensive disease / Self-Care:Considerable assistance required, Intake: Normal or reduced / Level of Conscious: Full or confusion    Temp:  [36.6 ??C (97.9 ??F)-37.3 ??C (99.2 ??F)] 37 ??C (98.6 ??F)  Heart Rate:  [96-102] 96  SpO2 Pulse:  [100-102] 102  Resp:  [15-20] 20  BP: (146-159)/(84-98) 150/94  SpO2:  [96 %-98 %] 96 %    I/O this shift:  In: 620 [P.O.:620]  Out: -     Physical Exam: GEN: Middle aged man, lying in bed, NAD  EYES: anicteric  ENT: dry mm   CARDIO: RRR, no heave, no edema  RESP: CTAB, normal work of breathing  GI: s/nt/nd   MSK: no joint swelling   SKIN: warm and dry, no rash  NEURO: lethargic but able to engage in brief conversation, slow to answer questions      Test Results:  Lab Results   Component Value Date    WBC 2.6 (L) 05/06/2018    RBC 2.75 (L) 05/06/2018    HGB 7.1 (L) 05/06/2018    HCT 22.9 (L) 05/06/2018    MCV 83.5 05/06/2018    MCH 25.8 (L) 05/06/2018    MCHC 30.9 (L) 05/06/2018    RDW 17.3 (H) 05/06/2018    PLT 62 (L) 05/06/2018     Lab Results   Component Value Date    NA 136 05/06/2018    K 4.0 05/06/2018    CL 102 05/06/2018    CO2 27.0 05/06/2018    BUN 17 05/06/2018    CREATININE 0.48 (L) 05/06/2018    GLU 79 05/06/2018    CALCIUM 7.8 (L) 05/06/2018    ALBUMIN 2.6 (L) 05/05/2018    PHOS 2.8 (L) 04/29/2018      Lab Results   Component Value Date    ALKPHOS 267 (H) 05/05/2018    BILITOT 0.5 05/05/2018    BILIDIR 0.20 04/29/2018    PROT 5.1 (L) 05/05/2018    ALBUMIN 2.6 (  L) 05/05/2018    ALT 22 05/05/2018    AST 73 (H) 05/05/2018     Utox reviewed: neg cocaine    Imaging: Reviewed      Total time spent with patient for evaluation & management: 1 Hour  Start time - stop time:    Greater than 50% time spent on counseling/coordination of care:  Yes.   See ACP Note from today for additional billable service:  No.

## 2018-05-07 NOTE — Unmapped (Signed)
OUTPATIENT ONCOLOGY PALLIATIVE CARE    Principal Diagnosis: Russell [name] is a 57 y.o. 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 He then had XRT to right femur/hip completed 01/2018.  Patient had [place] hospitalization in 04/2018 for increased pain with evidence of disease progression.  PMH include cocaine use.    Assessment/Plan:    1. Cocaine use/opioid use disorder:  Patient has longstanding cocaine use and he has not been able to communicate openly.  He also does not have interest in addressing his cocaine abuse.  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  Prognosis uncertain - with cancer progression - potentially in weeks-months..  Overall approach is to minimize harms associated with opioid use though continue prescribing given pain associated with metastatic disease.    Plan:  -- continue urine monitoring.  -- discussed that current opioid doses will not be increased by our team with continued cocaine use, and that dose may be decreased if we have concerns re potential harms/side effects.  -- discussed that cocaine use may be impacting efficacy of opioid medications for pain    ADDENDUM: + cocaine in urine (despite patient stating that he has not been using)    2.  Pain:  Currently with left hip and leg pain, associated with bony metastases.. Pain management is complicated by patient's active cocaine abuse and use of non-prescribed opioids.  Overall, with adequate pain control.      Plan:  -- continue methadone 15 mg BID (QTC 450 a few weeks ago, plan periodic monitoring)  -- Continue Oxycodone 30 mg Q4hrs prn   -- continue 1 week opioid scripts - no early refills.    -- continue gabapentin 900 mg tid (though uncertain benefit - would taper if has recurrent sedation)  -- advised that maximum dose of ibuprofen is 2,400 mg/day (800 tid).  Recommended that patient take omeprazole 20 mg qd    3. Controlled substances risk management.   - Patient has a signed pain medication agreement with Outpatient Palliative Care, completed on 03/17/18, as per standard of care.   - [place] database was reviewed today and it was appropriate.   - Urine drug screen was performed at this visit. Findings: pending.   - Patient has received information about safe storage and administration of medications.   - Patient has received a prescription for narcan.     F/u: 1-2 weeks  ---------------------------------------  Referring Provider: Salem Caster MD  Oncology Team: [facility]  PCP: [facility]      HPI: Russell [name] is a 57 y.o. 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 He then had XRT to right femur/hip completed 01/2018. He is now being treated with cabozantinib started  01/08/18 along with Prednisone 60mg  daily.    Today Mr. Huttner shares that he is aware his cancer has spread throughout his body, but is hoping his current chemotherapy will get  rid of this stuff. He does want his providers to be direct with him about his prognosis, however, and while he shares he does not enjoy talking about his cancer, would want his providers to be straight-forward and honest about his prognosis should his cancer fail to respond to current therapies. In terms of symptoms, Mr. Mcelrath shares his pain is worst in his right thigh and hip, feels like someone stabbing me with an ice pick, is 10/10 (NRS) at its worst, and 2/10 after taking 10mg  Oxycodone which he reports using three times a day. Pt shares that Ibuprofen and lidocaine patches are also helpful in alleviating pain somewhat, though not as effective as Oxycodone. He often wakes at night with pain. He shares he previously had poor appetite, but this has improved on increasing his Prednisone dose from 20 to 60mg  a day. He otherwise denies significant symptoms, other than mild nausea after taking his medications in the morning that is resolved with Zofran. He denies HA, SOB, chest pain, abdominal pain, persistent nausea, constipation, depression, anxiety, or any other symptoms.    Regarding advance care planning, Mr. Grover shares he has not thought much about what kind of interventions he would or would not want were he to become seriously ill, though he does want his HCPOA to be his friends [name] and [name], not any of his immediate family members.    Interval history, 04/22/18, GW: follow up. Friends [name] and Rosey Bath present for this visit with patient's permission  -- urine tox + for cocaine -- patient had denied cocaine use at yesterday's visit.  [name] states that she was not with the patient last weekend.  -- patient again stated that he has not taken unprescribed opioids - both buprenorphine and hydrocodone were present on confirmation test from urine two weeks ago  -- patient had visit with Marianna Payment, PA from [facility] yesterday - expressed no interest in stopping cocaine use.    Interval history, 05/01/18, GW: follow up. Friend connie also participate. Dr. Robyn [place]and (psychiatry) also participated in this visit.  -- transitioned from hospital to home (staying with connie) a few days ago.  They are in [place].  He feels worn out and tired.  He is using O2 2L via [place] - cancer progression affecting his lungs.  Chemotherapy pills delivered yesterday to his home - he hasn't started to take.  -- pain: currently taking methadone 15 mg bid and oxycodone 30 mg approx 5x/day.  Has pain in lower back and in left hip.  Pain level varies.  Currently rates pain as a steady 6.  Has also used a lidoderm patch. Takes ibuprofen 600 or 800 mg tid and gabapentin 900 mg tid.  Has 7 tablets remaining from oxy script (roughly appropriate) and many methadone tablets.  -- denies cocaine use in the last weeks - urine neg for cocaine 4 days ago. Denies etoh use - doesn't like taste s/p chemo.  -- PO intake - fruits and milkshake.  -- patient states I wish I felt better. - attempted to engage discussion about patient's condition and future care options, including his care when his condition worsens.    Palliative Performance Scale: 70% - Ambulation: Reduced / unable to do normal work, some evidence of disease / Self-Care: Full / Intake: Normal or reduced / Level of Conscious: Full    Coping/Support Issues: Issues with previously documented cocaine use.    Goals of Care: Pt currently focused on symptom relief, though wishes to  continue any available cancer-directed treatments.    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  Hobbies: Previously played softball, likes to shoot pool at bars with his mom.  Current residence / distance from [facility]: lives in [place]    Advance Care Planning:   HCPOA: Wishes to have friends [name] [phone]), and [name] [phone]).  Natural surrogate decision maker: Mother  Living Will: None  ACP note: No    Objective     Opioid Risk Tool:  ?? Male  Male    Family history of substance abuse      Alcohol  1  3    Illegal drugs  2  3    Rx drugs  4  4    Personal history of substance abuse      Alcohol  3  3    Illegal drugs  4  4    Rx drugs  5  5    Age between 23???45 years  1  1    History of preadolescent sexual abuse  3  0    Psychological disease      ADD, OCD, bipolar, schizophrenia  2  2    Depression  1  1 Total: Not completed today. Pt high risk by definition with previous documented cocaine use and prior prescribers refusing to continue providing pain medications due to concern for misuse/diversion (see assessment).  (<3 low risk, 4-7 moderate risk, >8 high risk)     No history exists.       Patient Active Problem List   Diagnosis   ??? Metastatic renal cell carcinoma to bone (CMS-HCC)   ??? Substance abuse (CMS-HCC)   ??? Cancer related pain   ??? Leukoplakia of oral cavity   ??? Essential hypertension   ??? Pancytopenia (CMS-HCC)   ??? Encephalopathy   ??? Hypoxemia   ??? Shortness of breath   ??? Tobacco use disorder       Past Medical History:   Diagnosis Date   ??? Bone metastasis (CMS-HCC)    ??? Renal cancer (CMS-HCC)        Past Surgical History:   Procedure Laterality Date   ??? FLEXIBLE BRONCHOSCOPY  07/24/2017   ??? HERNIA REPAIR         No current facility-administered medications for this visit.      No current outpatient medications on file.     Facility-Administered Medications Ordered in Other Visits   Medication Dose Route Frequency Provider Last Rate Last Dose   ??? acetaminophen (TYLENOL) tablet 1,000 mg  1,000 mg Oral TID Clance Boll, MD   1,000 mg at 05/07/18 1131   ??? albuterol (PROVENTIL HFA;VENTOLIN HFA) 90 mcg/actuation inhaler 2 puff  2 puff Inhalation Q4H PRN Clance Boll, MD       ??? amLODIPine (NORVASC) tablet 5 mg  5 mg Oral Daily Clance Boll, MD   5 mg at 05/07/18 0851   ??? diclofenac sodium (VOLTAREN) 1 % gel 2 g  2 g Topical 4x Daily Clance Boll, MD   2 g at 05/07/18 1131   ??? enoxaparin (LOVENOX) syringe 40 mg  40 mg Subcutaneous Q24H Clance Boll, MD   40 mg at 05/06/18 2008   ??? gabapentin (NEURONTIN) capsule 300 mg  300 mg Oral TID Clance Boll, MD   300 mg at 05/07/18 8413   ???  HYDROmorphone (PF) (DILAUDID) injection 2 mg  2 mg Intravenous Once Carrolyn Meiers, MD       ??? ibuprofen (ADVIL,MOTRIN) tablet 200 mg  200 mg Oral Q8H PRN Clance Boll, MD   200 mg at 05/05/18 2314   ??? lactulose (CHRONULAC) oral solution (30 mL cup)  10 g Oral BID Carrolyn Meiers, MD       ??? levofloxacin (LEVAQUIN) 750 mg/150 mL IVPB 750 mg  750 mg Intravenous Q24H Clance Boll, MD 100 mL/hr at 05/07/18 1322 750 mg at 05/07/18 1322   ??? lidocaine (LIDODERM) 5 % patch 1 patch  1 patch Transdermal Q24H Clance Boll, MD   1 patch at 05/06/18 2008   ??? melatonin tablet 3 mg  3 mg Oral Nightly PRN Clance Boll, MD       ??? methadone (DOLOPHINE) tablet 10 mg  10 mg Oral BID [name], MD   10 mg at 05/07/18 0917   ??? nicotine (NICODERM CQ) 14 mg/24 hr patch 1 patch  1 patch Transdermal Daily Clance Boll, MD   1 patch at 05/07/18 0856   ??? nicotine polacrilex (NICORETTE) gum 2 mg  2 mg Buccal Q1H PRN Clance Boll, MD       ??? ondansetron (ZOFRAN-ODT) disintegrating tablet 8 mg  8 mg Oral Q8H PRN Clance Boll, MD       ??? oxyCODONE (ROXICODONE) immediate release tablet 30 mg  30 mg Oral Q2H PRN [name], MD   30 mg at 05/07/18 1150   ??? pantoprazole (PROTONIX) EC tablet 20 mg  20 mg Oral Daily Clance Boll, MD   20 mg at 05/07/18 0850   ??? polyethylene glycol (MIRALAX) packet 17 g  17 g Oral Daily PRN Clance Boll, MD   17 g at 05/05/18 2315   ??? senna (SENOKOT) tablet 1 tablet  1 tablet Oral Nightly Clance Boll, MD   1 tablet at 05/06/18 2008   ??? tiotropium (SPIRIVA) 18 mcg inhalation capsule 18 mcg  18 mcg Inhalation Daily (RT) Clance Boll, MD   18 mcg at 05/07/18 0850       Allergies: No Known Allergies    Family History:  Cancer-related family history includes Brain cancer in his paternal uncle.  indicated that the status of his paternal uncle is unknown.      REVIEW OF SYSTEMS:  A comprehensive review of 14 systems was negative except for pertinent positives noted in HPI.      PHYSICAL EXAM:   Vital signs for this encounter: VS reviewed in EPIC.  GEN: Awake and alert, caucasian male sitting in chair, uncomfortable.  PSYCH: Alert and oriented to person, place and time. Euthymic.  HEENT: Pupils equally round without scleral icterus. No facial asymmetry.  LUNGS: No increased work of breathing, no audible stridor or wheeze.  ABD: Non-distended  SKIN: No rashes, petechiae or jaundice noted  EXT: No edema noted of the lower extremities  NEURO: No focal deficits noted    Lab Results   Component Value Date    CREATININE 0.49 (L) 05/07/2018     Lab Results   Component Value Date    ALKPHOS 267 (H) 05/05/2018    BILITOT 0.5 05/05/2018    BILIDIR 0.20 04/29/2018    PROT 5.1 (L) 05/05/2018    ALBUMIN 2.6 (L) 05/05/2018    ALT 22 05/05/2018    AST 73 (H) 05/05/2018  I personally spent over half of a total 45. minutes in counseling and discussion with the patient as described above.    Doreatha Martin, MD  [facility] Outpatient Oncology Palliative Care.

## 2018-05-08 ENCOUNTER — Ambulatory Visit: Admit: 2018-05-08 | Discharge: 2018-05-09 | Attending: Internal Medicine | Primary: Internal Medicine

## 2018-05-08 DIAGNOSIS — Z66 Do not resuscitate: Secondary | ICD-10-CM

## 2018-05-08 DIAGNOSIS — J449 Chronic obstructive pulmonary disease, unspecified: Secondary | ICD-10-CM

## 2018-05-08 DIAGNOSIS — C649 Malignant neoplasm of unspecified kidney, except renal pelvis: Secondary | ICD-10-CM

## 2018-05-08 DIAGNOSIS — I10 Essential (primary) hypertension: Secondary | ICD-10-CM

## 2018-05-08 DIAGNOSIS — C7951 Secondary malignant neoplasm of bone: Secondary | ICD-10-CM

## 2018-05-08 DIAGNOSIS — G893 Neoplasm related pain (acute) (chronic): Principal | ICD-10-CM

## 2018-05-08 DIAGNOSIS — F1410 Cocaine abuse, uncomplicated: Secondary | ICD-10-CM

## 2018-05-08 DIAGNOSIS — C779 Secondary and unspecified malignant neoplasm of lymph node, unspecified: Secondary | ICD-10-CM

## 2018-05-08 DIAGNOSIS — Z9981 Dependence on supplemental oxygen: Secondary | ICD-10-CM

## 2018-05-08 DIAGNOSIS — F17210 Nicotine dependence, cigarettes, uncomplicated: Secondary | ICD-10-CM

## 2018-05-08 DIAGNOSIS — Z7951 Long term (current) use of inhaled steroids: Secondary | ICD-10-CM

## 2018-05-08 DIAGNOSIS — C7801 Secondary malignant neoplasm of right lung: Secondary | ICD-10-CM

## 2018-05-08 DIAGNOSIS — C641 Malignant neoplasm of right kidney, except renal pelvis: Principal | ICD-10-CM

## 2018-05-08 DIAGNOSIS — Z9181 History of falling: Secondary | ICD-10-CM

## 2018-05-08 DIAGNOSIS — D61818 Other pancytopenia: Secondary | ICD-10-CM

## 2018-05-08 LAB — CBC W/ AUTO DIFF
BASOPHILS ABSOLUTE COUNT: 0.1 10*9/L (ref 0.0–0.1)
BASOPHILS RELATIVE PERCENT: 1.7 %
EOSINOPHILS ABSOLUTE COUNT: 0 10*9/L (ref 0.0–0.4)
HEMOGLOBIN: 7.7 g/dL — ABNORMAL LOW (ref 13.5–17.5)
LARGE UNSTAINED CELLS: 6 % — ABNORMAL HIGH (ref 0–4)
LYMPHOCYTES ABSOLUTE COUNT: 1.6 10*9/L (ref 1.5–5.0)
LYMPHOCYTES RELATIVE PERCENT: 36.1 %
MEAN CORPUSCULAR HEMOGLOBIN CONC: 31.2 g/dL (ref 31.0–37.0)
MEAN CORPUSCULAR HEMOGLOBIN: 25.9 pg — ABNORMAL LOW (ref 26.0–34.0)
MEAN CORPUSCULAR VOLUME: 82.9 fL (ref 80.0–100.0)
MEAN PLATELET VOLUME: 8.5 fL (ref 7.0–10.0)
MONOCYTES ABSOLUTE COUNT: 0.3 10*9/L (ref 0.2–0.8)
MONOCYTES RELATIVE PERCENT: 7.4 %
NEUTROPHILS ABSOLUTE COUNT: 2.2 10*9/L (ref 2.0–7.5)
NEUTROPHILS RELATIVE PERCENT: 48 %
NUCLEATED RED BLOOD CELLS: 10 /100{WBCs} — ABNORMAL HIGH (ref <=4)
PLATELET COUNT: 55 10*9/L — ABNORMAL LOW (ref 150–440)
RED BLOOD CELL COUNT: 2.99 10*12/L — ABNORMAL LOW (ref 4.50–5.90)
WBC ADJUSTED: 4.5 10*9/L (ref 4.5–11.0)

## 2018-05-08 LAB — OPIATE, URINE, QUANTITATIVE
6-MONOACETYLMRPH: <20 ng/mL
CODEINE GC/MS CONF: <50 ng/mL
HYDROCODONE GC/MS CONF: <50 ng/mL
NORBUPRENORPHINE: <5 ng/mL
OXYCODONE (GC/MS): 17047 ng/mL

## 2018-05-08 LAB — BASIC METABOLIC PANEL
ANION GAP: 13 mmol/L (ref 9–15)
BLOOD UREA NITROGEN: 17 mg/dL (ref 7–21)
BUN / CREAT RATIO: 30
CALCIUM: 7.7 mg/dL — ABNORMAL LOW (ref 8.5–10.2)
CO2: 23 mmol/L (ref 22.0–30.0)
CREATININE: 0.57 mg/dL — ABNORMAL LOW (ref 0.70–1.30)
EGFR MDRD AF AMER: >=60 mL/min/{1.73_m2} (ref >=60)
EGFR MDRD NON AF AMER: >=60 mL/min/{1.73_m2} (ref >=60)
GLUCOSE RANDOM: 89 mg/dL (ref 65–179)
POTASSIUM: 4.5 mmol/L (ref 3.5–5.0)
SODIUM: 136 mmol/L (ref 135–145)

## 2018-05-08 LAB — CODEINE GC/MS CONF: Lab: <50

## 2018-05-08 LAB — MAGNESIUM: Magnesium:MCnc:Pt:Ser/Plas:Qn:: 2.1

## 2018-05-08 LAB — NUCLEATED RED BLOOD CELLS: Lab: 10 — ABNORMAL HIGH

## 2018-05-08 LAB — CALCIUM: Calcium:MCnc:Pt:Ser/Plas:Qn:: 7.7 — ABNORMAL LOW

## 2018-05-08 MED ORDER — POLYETHYLENE GLYCOL 3350 17 GRAM ORAL POWDER PACKET: each | 1 refills | 0 days

## 2018-05-08 MED ORDER — MELATONIN 3 MG TABLET
Freq: Every evening | ORAL | 0 refills | 0.00000 days | Status: CP | PRN
Start: 2018-05-08 — End: 2019-05-08

## 2018-05-08 MED ORDER — LEVOFLOXACIN 750 MG TABLET
ORAL | 0 refills | 0 days
Start: 2018-05-08 — End: 2019-05-08

## 2018-05-08 MED ORDER — LACTULOSE 10 GRAM/15 ML ORAL SOLUTION
0 refills | 0 days
Start: 2018-05-08 — End: 2019-05-08

## 2018-05-08 MED ORDER — METHADONE 5 MG TABLET
ORAL_TABLET | Freq: Two times a day (BID) | ORAL | 0 refills | 0 days | Status: CP
Start: 2018-05-08 — End: 2018-05-13

## 2018-05-08 MED ORDER — LACTULOSE 20 GRAM/30 ML ORAL SOLUTION
Freq: Three times a day (TID) | ORAL | 0 refills | 0 days | Status: CP
Start: 2018-05-08 — End: 2018-05-18

## 2018-05-08 MED ORDER — MELATONIN 3 MG TABLET: 3 mg | each | 0 refills | 0 days

## 2018-05-08 MED ORDER — OXYCODONE 30 MG TABLET
ORAL_TABLET | ORAL | 0 refills | 0 days | Status: CP | PRN
Start: 2018-05-08 — End: 2018-05-13

## 2018-05-08 MED ORDER — ONDANSETRON 4 MG DISINTEGRATING TABLET
ORAL_TABLET | Freq: Three times a day (TID) | ORAL | 0 refills | 0.00000 days | Status: CP | PRN
Start: 2018-05-08 — End: 2019-05-08

## 2018-05-08 MED ORDER — POLYETHYLENE GLYCOL 3350 17 GRAM ORAL POWDER PACKET
Freq: Every day | ORAL | 1 refills | 0.00000 days | Status: CP | PRN
Start: 2018-05-08 — End: 2018-05-08

## 2018-05-08 MED ORDER — SENNOSIDES 8.6 MG TABLET
ORAL_TABLET | Freq: Every evening | ORAL | 0 refills | 0 days | Status: CP
Start: 2018-05-08 — End: 2018-05-08

## 2018-05-08 MED ORDER — GABAPENTIN 300 MG CAPSULE
ORAL_CAPSULE | Freq: Three times a day (TID) | ORAL | 0 refills | 0 days
Start: 2018-05-08 — End: 2018-06-07

## 2018-05-08 MED ORDER — SENNOSIDES 8.6 MG TABLET: 2 | tablet | Freq: Every evening | 0 refills | 0 days | Status: AC

## 2018-05-08 MED ORDER — ONDANSETRON 4 MG DISINTEGRATING TABLET: 8 mg | tablet | 0 refills | 0 days

## 2018-05-08 MED FILL — ONDANSETRON ODT/4MG ODT/TBDP: ONDANSETRON ODT/4MG ODT/TBDP | 5 days supply | Qty: 30 | Fill #0

## 2018-05-08 MED FILL — MELATONIN/3MG/TABS: MELATONIN/3MG/TABS | 100 days supply | Qty: 100 | Fill #0

## 2018-05-08 MED FILL — LEVOFLOXACIN/750MG/TABS: LEVOFLOXACIN/750MG/TABS | 3 days supply | Qty: 3 | Fill #0

## 2018-05-08 MED FILL — SENNA TABLET/8.6MG/TAB: SENNA TABLET/8.6MG/TAB | 30 days supply | Qty: 60 | Fill #0

## 2018-05-08 MED FILL — PEG 3350//PACK: PEG 3350//PACK | 30 days supply | Qty: 30 | Fill #0

## 2018-05-08 MED FILL — LACTULOSE SOLUTION 10 GM/15ML//SOLN: LACTULOSE SOLUTION 10 GM/15ML//SOLN | 10 days supply | Qty: 900 | Fill #0

## 2018-05-08 MED FILL — METHADONE/5MG/TAB: METHADONE/5MG/TAB | 5 days supply | Qty: 30 | Fill #0

## 2018-05-08 MED FILL — OXYCODONE HCL/30MG/TABS: OXYCODONE HCL/30MG/TABS | 5 days supply | Qty: 30 | Fill #0

## 2018-05-08 NOTE — Unmapped (Signed)
Palliative Care Progress Note    Consultation from Requesting Attending Physician:  Olivia Mackie, MD  Primary Care Provider:  [facility]    Code Status: Full Code  Advance Directive Status: HCPOA Morton Stall      Assessment/Recommendation:   SUMMARY:  Russell [name] is an 57 y.o. M with metastatic RCC with ongoing issues with pain control and  progressive decline including weakness, anorexia and confusion.    1. Symptom Assessment and Recommendations:      # Cancer-Related Pain: challenging to manage for multiple reasons including extensive disease, prior issues with consistently taking regimen, and ongoing cocaine use. Friends and family feel that methadone is primary reason for ongoing confusion and while it is probably contributing, I worry that decline from cancer is also a significant component. Given goals are pain control and is okay with sedation then will change back to oxycodone as he felt it was more helpful than dilaudid.  - Continue methadone 10 mg BID  - Discontinue dilaudid 6 mg PO q4hr prn  - Restart oxycodone 30 mg PO q2hr prn  - Will consider transition to fentanyl patch    # Confusion/Delirium: multifactorial- metastatic cancer, cocaine use, opioids and ongoing pain issues. Patient is okay with sedation if it means better pain control.    2. Communication and ACP: See ACP note for full details  -- Prognosis and Understanding: He understands that his cancer is not curable. He has not discussed prognosis with his oncologist and he is not interested in knowing a timeline. We discussed why one might want to know a timeline including how it might help prioritize goals but he continues to prefer to not know.     -- Goals of Care and Decision Making:   Decision Maker at Visit  Patient, HCPOA present.    After discussion patient has decided to transition to home hospice care. He wants better pain control and is okay with increased sedation if necessary. Hospice services discussed.    3. Support / Other Communication and Counseling Topics:    Palliative care visit today included focused interview, active listening, therapeutic use of silence, offering support, sharing empathy and summarization of today's discussion as well as goals of care.    Thank you for this consult. Please page  Darrin Nipper  or Palliative Care [phone]) if there are any questions.     Subjective:  Continues to have severe pain in Left thigh. Doesn't think the dilaudid is as helpful. Still sleepy but less so than he has been per Aggie Cosier. He continues to not eat or drink much. He has had a few episodes of incontinence. He denies shortness of breath.      Objective:     Function:  40% - Ambulation: Mainly bed / Unable to do any work, extensive disease / Self-Care:M [employer] / Intake: Normal or reduced / Level of Conscious: Full , drowsy, or confusion    Temp:  [36.7 ??C (98.1 ??F)-37.5 ??C (99.5 ??F)] 37 ??C (98.6 ??F)  Heart Rate:  [109-116] 114  Resp:  [16-22] 18  BP: (120-147)/(72-98) 129/95  FiO2 (%):  [2 %] 2 %  SpO2:  [94 %-97 %] 97 %    I/O this shift:  In: 50 [P.O.:50]  Out: -     Physical Exam:  GEN: chronically-ill appearing man, lying in bed, grimacing with position changes  EYES: anicteric  ENT: dry mm   CARDIO: no heave  RESP: normal work of breathing  GI: nondistended    SKIN: warm and dry, no rash  NEURO: Awake for most of our conversation but dose off for short periods of time    Test Results:  Lab Results   Component Value Date    WBC 3.9 (L) 05/07/2018    RBC 2.93 (L) 05/07/2018    HGB 7.7 (L) 05/07/2018    HCT 24.5 (L) 05/07/2018    MCV 83.6 05/07/2018    MCH 26.3 05/07/2018    MCHC 31.4 05/07/2018    RDW 17.7 (H) 05/07/2018    PLT 64 (L) 05/07/2018     Lab Results   Component Value Date    NA 136 05/07/2018    K 4.1 05/07/2018    CL 101 05/07/2018    CO2 24.0 05/07/2018    BUN 14 05/07/2018    CREATININE 0.49 (L) 05/07/2018    GLU 80 05/07/2018    CALCIUM 7.8 (L) 05/07/2018    ALBUMIN 2.6 (L) 05/05/2018    PHOS 2.8 (L) 04/29/2018      Lab Results   Component Value Date    ALKPHOS 267 (H) 05/05/2018    BILITOT 0.5 05/05/2018    BILIDIR 0.20 04/29/2018    PROT 5.1 (L) 05/05/2018    ALBUMIN 2.6 (L) 05/05/2018    ALT 22 05/05/2018    AST 73 (H) 05/05/2018       New Imaging: na      Total time spent with patient for evaluation & management: 15 Minutes   Start time - stop time:    Greater than 50% time spent on counseling/coordination of care:  No.   See ACP Note from today for additional billable service:  Yes.

## 2018-05-08 NOTE — Unmapped (Signed)
ADVANCE CARE PLANNING NOTE    Discussion Date:  May 07, 2018    Patient has decisional capacity:  Yes    Patient has selected a Health Care Decision-Maker if loses capacity: Yes  Name:  Russell [name]   Contact Information:  See epic      Basis of health care decision-maker's authority?: health care power of attorney or other advance directive   [Please update this selection at each admission]    Discussion Participants:  Chance Munter, MD    Communication of Medical Status/Prognosis:   [name] understands that his cancer is not curable. We discussed his decline over the last month and multiple factors contributing to this decline including progression of cancer, poor pain control and cocaine use. I shared my concern that even with opioid rotation and potential improvement in some sedation that he may continue to decline due to his cancer. I also shared my concern that getting his pain controlled without some level of sedation may be difficult.     I asked if he wanted more information on his prognosis and he indicated that he did not. We did discuss why someone might choose to know prognosis including how it might help prioritize goals. He declined to know more information at this time. I suspect that his prognosis is likely weeks to months.    Communication of Treatment Goals/Options:   We explored his goals and quality of life. He would like to be healed and not go through this. He states he sleeps because it's easier and his pain is better control. We discussed options including continuing chemotherapy, decreasing/switching opioids to see if he had less sedation, and transition to hospice.     He is more alert now but pain has significantly worsened and he does not want it to be like this. He prefers to have better pain control and is okay with sedation. He would prefer this over continuing with chemotherapy. We discussed hospice care and the support it would provide. He agrees with this. Aggie Cosier supports his decision.    Treatment Decisions:   Home hospice referral    I spent between 16-45 minutes providing voluntary advance care planning services for this patient.

## 2018-05-08 NOTE — Unmapped (Addendum)
Discharge Services and Resources:    Home Hospice Services:  Home Hospice has been set up with [facility] at [phone].  Start of Care for Home Hospice will be on 05/09/2018. If you have not been contacted by Home Hospice by 05/09/18, please call the above number regarding services.       Home Medical Equipment: (Bedside commode be delivered to your hospital room prior to discharge)    [facility]  [phone]    If you have any questions regarding your medical equipment after discharge, please call the above number.

## 2018-05-08 NOTE — Unmapped (Signed)
SiteLast TxDose  ZO:[id number]: : 0/800 cGy    Remains inpatient. Dr. Sabino Gasser and Dr. Lucrezia Europe in to see pt prior to treatment. No needs/issues identified. Pt repositioned for comfort/comfort measures performed.

## 2018-05-08 NOTE — Unmapped (Signed)
Pt alert and oriented x4. Tachy in the 110s; all other VSS. Pt with c/o continuous lower back and bilateral hip pain; PRN pain medications administered as appropriate. Lidocaine patch and Voltaren gel also applied to painful areas per order. Pt with diminished urine production; bladder scan of 67 mL. LBM of 05/04/18; service notified and placed new orders. Pt denies abdominal pain, fullness, and/or any other concerns. No falls. Will continue to monitor.     Problem: Adult Inpatient Plan of Care  Goal: Plan of Care Review  Outcome: Progressing  Goal: Patient-Specific Goal (Individualization)  Outcome: Progressing  Goal: Absence of Hospital-Acquired Illness or Injury  Outcome: Progressing  Goal: Optimal Comfort and Wellbeing  Outcome: Progressing  Goal: Readiness for Transition of Care  Outcome: Progressing  Goal: Rounds/Family Conference  Outcome: Progressing     Problem: Fall Injury Risk  Goal: Absence of Fall and Fall-Related Injury  Outcome: Progressing     Problem: Self-Care Deficit  Goal: Improved Ability to Complete Activities of Daily Living  Outcome: Progressing     Problem: Pain Acute  Goal: Optimal Pain Control  Outcome: Progressing     Problem: Hypertension Comorbidity  Goal: Blood Pressure in Desired Range  Outcome: Progressing

## 2018-05-08 NOTE — Unmapped (Signed)
Daily Progress Note    Assessment/Plan:    Principal Problem:    Encephalopathy  Active Problems:    Metastatic renal cell carcinoma to bone (CMS-HCC)    Substance abuse (CMS-HCC)    Cancer related pain    Essential hypertension    Pancytopenia (CMS-HCC)    Shortness of breath    Tobacco use disorder    Russell [name] is a 57 y.o. male with PMHx of COPD, polysubstance abuse, metastatic RCC (currently on everolimus and lenvatinib) who presented to [facility] with uncontrolled pain, continued shortness of breath and cough.  ??  Metastatic RCC - Uncontrolled Pain:  Patient has diffuse mets (bone, lung, LN), and has progressed on first and second line therapy chemotherapy, currently he is on Everolimus and Lenvatinib.  Primary oncologist is Dr. Rod Can.  Patient has a history of difficult to control pain, with a recent admission to [facility] for uncontrolled pain. Patient follows with Dr. Legrand Como for pain mgmt. Patient and family have been discussing and considering hospice as an option, and the patient is open to the idea of hospice. CT sim completed 6/6.  -- Radiation treatment today for bony hip pain  -- Decision made to transition to home with hospice on discharge; see Dr. Molli Hazard ACP note for full conversation  -- Adjusted pain regimen:Tylenol 1000 mg TID, home Methadone to 15 mg BID, switched home Oxycodone 30 mg q4h PRN to q2h PRN per palliative recs.  -- Decrease gabapentin from home dose of 900 mg TID to 300 mg TID  -- Voltaren Gel PRN, Lidocaine Patch    Urinary incontinence: Newly developed overnight, patient reports no prior issues. Bladder scan after episode WNL. May be medication related given adjustments in regimen.  - CTM    Constipation: No bowel movement for several days per patient report.  - Increase lactulose to TID today  - Senna nightly  ??  PNA:  Patient currently being treated with a 10 day course of Augmentin (started on 5/28 during prior hospitalization)  for dyspnea, dry cough, new oxygen requirement of 2L. He continues to have cough, dyspnea, and low grade fevers at home. He is stable on home 2L of oxygen by nasal cannula and afebrile in the ED.  Lactate wnl.  CXR on admission showed increasing biapical opacifications compared to prior, possibly infectious.  -- Levaquin 750 mg daily x 7 days (6/4-6/10)  ??  Pancytopenia: Hemoglobin and platelets both low but stable compared to prior.    -- Type and Screen, Consent for blood products obtained  -- Transfuse for Hgb <7, Plts <10  ??  Substance, tobacco use: Reportedly groggy/confused prior to admission. Consistently cocaine positive on utox in the past, however negative on urine check day 2 of admission.  - Nicotine patch and nicotine gum PRN  ??  HTN: Continue home Amlodipine  ??  Elevated Troponin, downtrended: Troponin initially elevated in the ED, but multiple samples hemolyzed.  Patient with no chest pain.  EKG with no ischemic changes.  Likely 2/2 demand and likely cocaine abuse.  Troponin previously elevated during prior admissions from 0.066-0.095  ??  FEN/GI/Proph:  -- No IVF, replace lytes PRN, regular diet  -- home PPI, Lovenox [place]  ??  Code Status: Full Code, confirmed with patient on admission     LOS: 3 days     Subjective:    Remains remains uncomfortable from back/hip pain, mildly confused this morning. Planning for radiation treatment this afternoon. No additional episodes of urinary incontinence  overnight.    Objective:    Vital signs in last 24 hours:  Temp:  [36.7 ??C-37 ??C] 36.7 ??C  Heart Rate:  [111-118] 117  Resp:  [16-20] 18  BP: (120-137)/(72-95) 129/90  SpO2:  [93 %-97 %] 94 %    Intake/Output last 3 shifts:  I/O last 3 completed shifts:  In: 125 [P.O.:125]  Out: 500 [Urine:500]  Intake/Output this shift:  No intake/output data recorded.    Physical Exam:  Vitals:    05/08/18 1144   BP: 129/90   Pulse: 117   Resp: 18   Temp: 36.7 ??C   SpO2: 94%     General: Alert,  uncomfortable in bed, no acute distress  HEENT: Extraocular movements intact, sclerae anicteric  Respiratory: Coarse breaths bilaterally, nonlabored respirations, [place] in place  Cardiovascular: Mild tachycardia, no murmurs, rubs, or gallops, nondisplaced PMI  Abdominal: Soft, nondistended, nontender, normoactive bowel sounds  Extremities: No peripheral edema, no clubbing  Neuro: A&Ox3, Cranial nerves II through XII grossly intact  Psych: Appropriate mood and affect    Labs/Studies:  Labs and Studies from the last 24hrs per EMR and Reviewed      [name]  Internal Medicine Resident, PGY-1  05/08/2018 1:12 PM

## 2018-05-09 ENCOUNTER — Encounter: Admit: 2018-05-09 | Discharge: 2018-05-12 | Payer: MEDICAID

## 2018-05-09 ENCOUNTER — Inpatient Hospital Stay: Admit: 2018-05-09 | Discharge: 2018-05-12 | Payer: MEDICAID

## 2018-05-09 DIAGNOSIS — F17210 Nicotine dependence, cigarettes, uncomplicated: Secondary | ICD-10-CM

## 2018-05-09 DIAGNOSIS — C7801 Secondary malignant neoplasm of right lung: Secondary | ICD-10-CM

## 2018-05-09 DIAGNOSIS — F1410 Cocaine abuse, uncomplicated: Secondary | ICD-10-CM

## 2018-05-09 DIAGNOSIS — I10 Essential (primary) hypertension: Secondary | ICD-10-CM

## 2018-05-09 DIAGNOSIS — D61818 Other pancytopenia: Secondary | ICD-10-CM

## 2018-05-09 DIAGNOSIS — Z7951 Long term (current) use of inhaled steroids: Secondary | ICD-10-CM

## 2018-05-09 DIAGNOSIS — G893 Neoplasm related pain (acute) (chronic): Secondary | ICD-10-CM

## 2018-05-09 DIAGNOSIS — C779 Secondary and unspecified malignant neoplasm of lymph node, unspecified: Secondary | ICD-10-CM

## 2018-05-09 DIAGNOSIS — J449 Chronic obstructive pulmonary disease, unspecified: Secondary | ICD-10-CM

## 2018-05-09 DIAGNOSIS — Z66 Do not resuscitate: Secondary | ICD-10-CM

## 2018-05-09 DIAGNOSIS — Z9981 Dependence on supplemental oxygen: Secondary | ICD-10-CM

## 2018-05-09 DIAGNOSIS — Z9181 History of falling: Secondary | ICD-10-CM

## 2018-05-09 DIAGNOSIS — C641 Malignant neoplasm of right kidney, except renal pelvis: Principal | ICD-10-CM

## 2018-05-09 DIAGNOSIS — C7951 Secondary malignant neoplasm of bone: Secondary | ICD-10-CM

## 2018-05-09 MED ORDER — LEVOFLOXACIN 750 MG TABLET
ORAL_TABLET | Freq: Every day | ORAL | 0 refills | 0.00000 days | Status: CP
Start: 2018-05-09 — End: 2018-05-09

## 2018-05-09 NOTE — Unmapped (Signed)
Radiation Treatment Management Note    Encounter Date: 05/08/2018  Patient Name: Russell [name]  Medical Record Number: [id number]    Diagnosis: 57 y.o. male with metastatic RCC, re-admitted with significant pain in the left hip 2/2 RCC. Planning palliative radiation therapy.     Current Treatment:  Cumulative dose 800 of 800 cGy (1st and only treatment today)    Recommendations:  1. Plan for Therapy: First and only radiation treatment today  2. Follow-Up: As needed with [place]. Being discharged from hospital home with hospice tomorrow.  ----------------------------------------------------------------------------------------------------------    Subjective:  Overall: Feeling okay.       Physical Exam:  General: Laying in Doctor, general practice. No acute distress.     Augustine Radar, M.D. M.S.  PGY4, Radiation Oncology.  [facility]  [id number]  [place] [address]  Pager: [phone]    I saw and evaluated and examined the patient, participating in the key portions of the service. I discussed the findings, assessment, and plan of care with the provider and patient. I agree with the findings and plan as documented in the note.     Trena Platt MD MS MPH  Assistant Professor  Department of Radiation Oncology  [employer] at [facility]

## 2018-05-09 NOTE — Unmapped (Signed)
Mr. Earnhart was in radiation when I went to see him. Spoke with his friend [name] and Leanne Lovely who are prepared for him to go home with hospice tomorrow. Explained that I would let hospice make further adjustments to medications going forward.    Continues to be in pain per report from team and [name].    Recommend:  Increase methadone to 15 mg BID  Continue oxycodone 30 mg po q2hr prn    Darrin Nipper, MD

## 2018-05-09 NOTE — Unmapped (Signed)
[facility]  [facility]  [address]  [place] [address]  Dept: [phone]  Loc: [phone]     Identifying Information:   LEIBISH MCGREGOR  [birth year]  [id number]    Primary Care Physician: [facility]   Code Status: DNR and DNI    Admit Date: 05/05/2018    Discharge Date: 05/09/2018     Discharge To: Home Hospice    Discharge Service: MDE - Solid Tumor     Discharge Attending Physician: Olivia Mackie, MD    Discharge Diagnoses:  Principal Problem:    Encephalopathy  Active Problems:    Metastatic renal cell carcinoma to bone (CMS-HCC)    Substance abuse (CMS-HCC)    Cancer related pain    Essential hypertension    Pancytopenia (CMS-HCC)    Shortness of breath    Tobacco use disorder  Resolved Problems:    * No resolved hospital problems. *      Outpatient Provider Follow Up Issues:   [ ]  pain management with hospice and palliative care, consider transitioning to fentanyl patch as needed  [ ]  decreased gabapentin 900 to 300 TID due to sedation, consider resuming previous dose as needed  [ ]  adjustment of lactulose as needed with goal of daily BM  [ ]  stopped amlodipine to decrease pill burden    Hospital Course:     Adedamola Seto Henigan??is a 57 y.o.??male??with PMHx of COPD, polysubstance abuse, metastatic RCC (currently on everolimus and lenvatinib)??who presented to [facility] with??uncontrolled pain, continued shortness of breath and cough.  ??  Metastatic RCC - Uncontrolled Pain:????Patient has diffuse mets (bone, lung, LN), and has progressed on first and second line therapy chemotherapy, currently he is on Everolimus and Lenvatinib. ??Primary oncologist is Dr. Rod Can. ??Patient has a history of difficult to control pain, with a recent admission to 4 Onc for uncontrolled pain. Patient follows with Dr. Winzelberg??for pain mgmt.??Patient and family have been discussing and considering hospice as an option, and the patient is open to the idea of hospice. Seen by radiation oncology who performed palliative therapy on 6/7 to the hip. Pain regimen adjusted per palliative team, discharged with home methadone 15mg  BID, increased oxycodone to 30mg  q2h as needed, decreased gabapentin to 300mg  TID, and continued Voltaren gel and Lidocaine patches. Decision made to focus on managing patient's pain without further cancer-directed therapies during admission and discharged with home hospice. See Dr. Molli Hazard ACP note during admission.    Constipation: No bowel movement for several days. Increased bowel regimen to lactulose TID with senna nightly.  ??  PNA:????Patient??currently being treated with a 10??day course of Augmentin (started on 5/28 during prior hospitalization)????for dyspnea, dry cough,??new oxygen requirement of 2L.??He continues to have cough, dyspnea, and low grade fevers at home. He??is stable on home 2L of oxygen by nasal cannula??and afebrile in the ED. ??Lactate wnl. ??CXR on admission showed increasing biapical opacifications compared to prior, possibly infectious. Treated with Levaquin??7 days (6/4- 6/10).  ??  Pancytopenia:??Hemoglobin and platelets both low but stable compared to prior. ??No transfusions needed during admission.  ??  Substance, tobacco use: Reportedly groggy/confused prior to admission. Consistently cocaine positive on utox in the past, negative on check 2nd day of admission except for opiates and methadone.  ??  HTN: Continued home Amlodipine  Elevated Troponin, downtrended: Troponin initially elevated in the ED, but multiple samples hemolyzed. ??Patient with no chest pain. ??EKG with no ischemic changes. ??Likely 2/2 demand and likely cocaine abuse.  Procedures:  No admission procedures for hospital encounter.  ______________________________________________________________________  Discharge Medications:     Your Medication List      STOP taking these medications    amLODIPine 5 MG tablet  Commonly known as:  NORVASC     amoxicillin-clavulanate 875-125 mg per tablet  Commonly known as:  AUGMENTIN     dexamethasone 0.5 mg/5 mL solution  Commonly known as:  DECADRON     everolimus (antineoplastic) 5 mg tablet  Commonly known as:  AFINITOR     lenvatinib 18 mg/day (10 mg x 1-4 mg x2) Cap     nicotine 14 mg/24 hr patch  Commonly known as:  NICODERM CQ     nicotine polacrilex 2 mg gum  Commonly known as:  NICORETTE     predniSONE 5 MG tablet  Commonly known as:  DELTASONE        START taking these medications    lactulose 20 gram/30 mL Soln  Commonly known as:  CHRONULAC  Take 30 mL (20 g total) by mouth Three (3) times a day. for 10 days     levoFLOXacin 750 MG tablet  Commonly known as:  LEVAQUIN  Take 1 tablet (750 mg total) by mouth daily. for 3 days     melatonin 3 mg Tab  Take 1 tablet (3 mg total) by mouth nightly as needed.     polyethylene glycol 17 gram packet  Commonly known as:  MIRALAX  Take 17 g by mouth daily as needed (Take for constipation, can use with lactulose as needed).     senna 8.6 mg tablet  Commonly known as:  SENOKOT  Take 2 tablets by mouth nightly.        CHANGE how you take these medications    methadone 5 MG tablet  Commonly known as:  DOLOPHINE  Take 3 tablets (15 mg total) by mouth Two (2) times a day. for 5 days  What changed:    ?? medication strength  ?? how much to take  ?? when to take this  ?? additional instructions     oxyCODONE 30 MG immediate release tablet  Commonly known as:  ROXICODONE  Take 1 tablet (30 mg total) by mouth every two (2) hours as needed. for up to 5 days  What changed:    ?? when to take this  ?? reasons to take this  ?? additional instructions        CONTINUE taking these medications    acetaminophen 500 MG tablet  Commonly known as:  TYLENOL  Take 2 tablets (1,000 mg total) by mouth three (3) times a day (at 6am, noon and 6pm).     ADVIL 200 MG tablet  Generic drug:  ibuprofen  Take 200 mg by mouth every eight (8) hours as needed for pain.     albuterol 90 mcg/actuation inhaler  Commonly known as:  PROVENTIL HFA;VENTOLIN HFA  Inhale 2 puffs every four (4) hours as needed.     diclofenac sodium 1 % gel  Commonly known as:  VOLTAREN  Apply 2 g topically Four (4) times a day.     gabapentin 300 MG capsule  Commonly known as:  NEURONTIN  Take 3 capsules (900 mg total) by mouth Three (3) times a day.     HELIUM-OXYGEN INHL  2 L by Each Nare route continuous.     lidocaine 5 % patch  Commonly known as:  LIDODERM  Place 1 patch on the skin daily. Apply  to affected area for 12 hours only each day (then remove patch)     naloxone nasal spray  Commonly known as:  NARCAN  One spray in either nostril once for known/suspected opioid overdose. May repeat every 2-3 minutes in alternating nostril til EMS arrives     omeprazole 20 MG capsule  Commonly known as:  PriLOSEC  Take 20 mg by mouth daily. TAKE 1 CAPSULE (20 MG TOTAL) BY MOUTH DAILY.     ondansetron 4 MG disintegrating tablet  Commonly known as:  ZOFRAN-ODT  Take 2 tablets (8 mg total) by mouth every eight (8) hours as needed for nausea. for up to 5 days     SPIRIVA WITH HANDIHALER 18 mcg inhalation capsule  Generic drug:  tiotropium  Place 18 mcg into inhaler and inhale.     urea 40 % cream  Commonly known as:  CARMOL  Apply 1 application topically Two (2) times a day. To hands and feet.            Allergies:  Patient has no known allergies.  ______________________________________________________________________  Pending Test Results (if blank, then none):   Order Current Status    Blood Culture #1 Preliminary result    Blood Culture #2 Preliminary result          Most Recent Labs:  All lab results last 24 hours - No results found for this or any previous visit (from the past 24 hour(s)).    Relevant Studies/Radiology (if blank, then none):  No results found.  ______________________________________________________________________  Discharge Instructions:               Follow Up instructions and Outpatient Referrals     Referral to hospice      Facility Type:  Home-based    Did you call Hospice before placing this order?:  Yes    Do you want ongoing co-management?:  Yes    Care coordination required?:  No    Admit to Home Hospice and treat for hospice diagnosis: metastatic renal cell carcinoma    Start of Care Date: Day of Discharge    Physician to follow patient's care (the person listed here will be responsible for signing ongoing orders):  Referring Provider:       Agency Details: [facility]         Discharge instructions      You were admitted to [facility] for continued pain and confusion. While here, we made adjustments in your pain medications and treated you with radiation with a goal of relieving some of your pain. We also treated you with a course of antibiotics which you will complete on discharge. We also discussed your overall treatment plans and decided it was most in line with your goals to not continue undergoing cancer treatments and to focus on managing your symptoms instead.    You will also be receiving additional support at home after discharge with [facility] home hospice who will be establishing care with you on 6/8. They will be providing you with contact information for their services at that time.    If you have trouble obtaining any of your medications you may call the [facility] Communication Center to speak with the triage team at [phone] if Monday through Friday 8am-5pm or call [phone] after hours.      For appointments & questions Monday through Friday 8 AM- 5 PM   please call [phone] or Toll free [phone].    On Nights, Weekends and  Holidays  Call [phone] and ask for the oncologist on call.    [facility]  [address]  [place], [place] [address]  [email or web address]         Discharge instructions      I certify that based on my evaluation of this patient, this patient requires BLS transportation services and that other forms of transport are contraindicated.  Please refer to care management transitions note for transportation details. Appointments which have been scheduled for you    May 09, 2018 To Be Determined  SN - HOSPICE ADMISSION EVAL with Barbera Setters, RN  [facility] AND HOSPICE [facility]) [address]  [place] [address]  [phone]      05-27-2018  5:00 PM EDT  SN - HOME VISIT with Park Meo, RN  [facility] AND HOSPICE [facility]) [address]  [address]  [place] [address]  [phone]      May 14, 2018 10:00 AM EDT  SN - HOME VISIT with Park Meo, RN  [facility] AND HOSPICE [facility]) [address]  [address]  [place] [address]  [phone]      May 18, 2018 10:15 AM EDT  (Arrive by 9:45 AM)  LAB ONLY [place] with ADULT ONC LAB  [facility] ADULT ONCOLOGY LAB DRAW STATION [facility]) [address]  [place] [address]  [phone]      May 18, 2018 11:30 AM EDT  (Arrive by 11:00 AM)  RETURN ACTIVE [place] with Kennieth Francois, FNP  [facility] FLR [facility]ER HOSP [facility]) [address]  [place] [address]  [phone]      May 19, 2018 To Be Determined  SN - HOME VISIT with Park Meo, RN  [facility] AND HOSPICE [facility]) [address]  [address]  [place] [address]  [phone]      May 26, 2018 To Be Determined  SN - HOME VISIT with Park Meo, RN  [facility] AND HOSPICE [facility]) [address]  [address]  [place] [address]  [phone]      May 28, 2018 To Be Determined  OT - HOME VISIT with Graciella Freer, OT  [facility] AND HOSPICE [facility]) [address]  [address]  [place] [address]  [phone]      Jun 02, 2018 To Be Determined  SN - HOME VISIT with Park Meo, RN [facility] AND HOSPICE [facility]) [address]  [address]  [place] [address]  [phone]      Jun 04, 2018 To Be Determined  OT - HOME VISIT with Graciella Freer, OT  [facility] AND HOSPICE [facility]) [address]  [address]  [place] [address]  [phone]      Jun 09, 2018 To Be Determined  SN - HOME VISIT with Park Meo, RN  [facility] HEALTH AND HOSPICE SCHEDULING [facility] REGION) [address]  [address]  [place] [address]  [phone]  ______________________________________________________________________  Discharge Day Services:  BP 120/89  - Pulse 115  - Temp 36.3 ??C (Oral)  - Resp 28  - Ht 170.2 cm (5' 7)  - Wt 80 kg (176 lb 6.4 oz)  - SpO2 95%  - BMI 27.63 kg/m??   Pt seen on the day of discharge and determined appropriate for discharge.    Condition at Discharge: poor, appropriate for home hospice    Length of Discharge: I spent greater than 30 mins in the discharge of this patient.

## 2018-05-09 NOTE — Unmapped (Addendum)
AOx4, VSS, remained afebrile and free from falls. Pt c/o of 10/10 pain 2x. PRN oxy given 2x. XRT completed today. Qshift bladder scan completed. Pt will discharge home with hospice tomorrow at 11am via ambulance. Family at bedside. Will continue to monitor.

## 2018-05-09 NOTE — Unmapped (Addendum)
Pt alert and oriented x4. Tachy in the 110s; all other VSS. Pt with c/o leg, back, and hip pain; PRN pain medication administered x1. Pt with diminished urine production. Per pt report, pt's LBM was on 6/3. BM medications administered. Pt denies abdominal pain and/or fullness. Service aware of constipation. Pt coughed up a small blood clot looking piece; service notified. Plan is for pt to go home with hospice at 1100. No falls. Will continue to monitor.     Problem: Adult Inpatient Plan of Care  Goal: Plan of Care Review  Outcome: Progressing  Goal: Patient-Specific Goal (Individualization)  Outcome: Progressing  Goal: Absence of Hospital-Acquired Illness or Injury  Outcome: Progressing  Goal: Optimal Comfort and Wellbeing  Outcome: Progressing  Goal: Readiness for Transition of Care  Outcome: Progressing  Goal: Rounds/Family Conference  Outcome: Progressing     Problem: Fall Injury Risk  Goal: Absence of Fall and Fall-Related Injury  Outcome: Progressing     Problem: Self-Care Deficit  Goal: Improved Ability to Complete Activities of Daily Living  Outcome: Progressing     Problem: Pain Acute  Goal: Optimal Pain Control  Outcome: Progressing     Problem: Hypertension Comorbidity  Goal: Blood Pressure in Desired Range  Outcome: Progressing

## 2018-05-09 NOTE — Unmapped (Signed)
Radiation Oncology Treatment Planning Note    Encounter Date: 05/07/2018  Patient Name: Russell [name]  Date of Birth: 04/30/1961  ZOX:[id number]  Patient Age: 57 y.o.    Treatment Intent: palliative.    CLINICAL TREATMENT PLANNING:     Russell [name] has metastatic RCC. Refer to the consult for full clinical details.    Briefly, this is a 73M with metastatic RCC, re-admitted with significant pain in the left hip 2/2 RCC. History of prior RT to lung and R hip. Extensive marrow signal heterogeneity on MRI throughout the pelvis and proximal femora on MRI.      Plan  8 Gy x 1 = 8 Gy to left hemipelvis and femur    I plan to treat with photons utilizing 3D CRT technique.     The radiation target area/treatment site will be the left hip/femur.     I will attempt to minimize the dose to spinal cord.    The total radiation dose will be 800 cGy at 800 cGy/fraction for a total of 1 fractions, treated once a day. Chemotherapy not administered.    Technique Rationale:     3DCRT: 3D conformal therapy is necessary to increase dose conformity and to review DVH's for the above critical structures.    Simulation Order: I requested radiation treatment planning CT scan to acquire information regarding patient's anatomy of the treatment site, radiation treatment target volumes, and adjacent normal organs. This is required to be done in patient's treatment position for radiation treatment planning and for patient's subsequent radiation treatment positioning and treatment setups. .     Image Guidance/Tracking: Daily PORT films to verify agreement of treatment ports with original planned fields.     SIMULATION:    Type:  Initial simulation of the left hip.    Contrast: none.    The patient was taken to the CT simulation room and placed in a supine position, scanned feet first. Catheters/markers were placed on surface anatomy of interest including painful area.  A CT scan was obtained through the pertinent area including the left femur and pelvis. I approved the patient set up and reviewed the CT images; both are adequate. I tentatively plan to utilize conformal blocking. The number and use of treatment devices will be determined at the time of computerized planning.    I have placed an isocenter/localization point in three dimensions on these images.  This was marked on the patient???s skin for subsequent radiation treatment set-up.  Additional details of the CT simulation are available in the departmental [facility] electronic medical record.  CT images were then transferred to the radiation treatment-planning computer for planning and dosimetry.    Trena Platt MD MS MPH  Assistant Professor  Department of Radiation Oncology  [employer] [facility]

## 2018-05-10 DIAGNOSIS — Z9981 Dependence on supplemental oxygen: Secondary | ICD-10-CM

## 2018-05-10 DIAGNOSIS — C7801 Secondary malignant neoplasm of right lung: Secondary | ICD-10-CM

## 2018-05-10 DIAGNOSIS — Z9181 History of falling: Secondary | ICD-10-CM

## 2018-05-10 DIAGNOSIS — D61818 Other pancytopenia: Secondary | ICD-10-CM

## 2018-05-10 DIAGNOSIS — C7951 Secondary malignant neoplasm of bone: Secondary | ICD-10-CM

## 2018-05-10 DIAGNOSIS — C779 Secondary and unspecified malignant neoplasm of lymph node, unspecified: Secondary | ICD-10-CM

## 2018-05-10 DIAGNOSIS — C641 Malignant neoplasm of right kidney, except renal pelvis: Principal | ICD-10-CM

## 2018-05-10 DIAGNOSIS — J449 Chronic obstructive pulmonary disease, unspecified: Secondary | ICD-10-CM

## 2018-05-10 DIAGNOSIS — I10 Essential (primary) hypertension: Secondary | ICD-10-CM

## 2018-05-10 DIAGNOSIS — Z66 Do not resuscitate: Secondary | ICD-10-CM

## 2018-05-10 DIAGNOSIS — G893 Neoplasm related pain (acute) (chronic): Secondary | ICD-10-CM

## 2018-05-10 DIAGNOSIS — F17210 Nicotine dependence, cigarettes, uncomplicated: Secondary | ICD-10-CM

## 2018-05-10 DIAGNOSIS — Z7951 Long term (current) use of inhaled steroids: Secondary | ICD-10-CM

## 2018-05-10 DIAGNOSIS — F1410 Cocaine abuse, uncomplicated: Secondary | ICD-10-CM

## 2018-05-11 DIAGNOSIS — C779 Secondary and unspecified malignant neoplasm of lymph node, unspecified: Secondary | ICD-10-CM

## 2018-05-11 DIAGNOSIS — C7951 Secondary malignant neoplasm of bone: Secondary | ICD-10-CM

## 2018-05-11 DIAGNOSIS — I10 Essential (primary) hypertension: Secondary | ICD-10-CM

## 2018-05-11 DIAGNOSIS — C641 Malignant neoplasm of right kidney, except renal pelvis: Principal | ICD-10-CM

## 2018-05-11 DIAGNOSIS — Z66 Do not resuscitate: Secondary | ICD-10-CM

## 2018-05-11 DIAGNOSIS — C7801 Secondary malignant neoplasm of right lung: Secondary | ICD-10-CM

## 2018-05-11 DIAGNOSIS — F17210 Nicotine dependence, cigarettes, uncomplicated: Secondary | ICD-10-CM

## 2018-05-11 DIAGNOSIS — J449 Chronic obstructive pulmonary disease, unspecified: Secondary | ICD-10-CM

## 2018-05-11 DIAGNOSIS — Z9181 History of falling: Secondary | ICD-10-CM

## 2018-05-11 DIAGNOSIS — G893 Neoplasm related pain (acute) (chronic): Secondary | ICD-10-CM

## 2018-05-11 DIAGNOSIS — Z7951 Long term (current) use of inhaled steroids: Secondary | ICD-10-CM

## 2018-05-11 DIAGNOSIS — F1410 Cocaine abuse, uncomplicated: Secondary | ICD-10-CM

## 2018-05-11 DIAGNOSIS — D61818 Other pancytopenia: Secondary | ICD-10-CM

## 2018-05-11 DIAGNOSIS — Z9981 Dependence on supplemental oxygen: Secondary | ICD-10-CM

## 2018-05-12 DIAGNOSIS — J449 Chronic obstructive pulmonary disease, unspecified: Secondary | ICD-10-CM

## 2018-05-12 DIAGNOSIS — Z9181 History of falling: Secondary | ICD-10-CM

## 2018-05-12 DIAGNOSIS — C649 Malignant neoplasm of unspecified kidney, except renal pelvis: Secondary | ICD-10-CM

## 2018-05-12 DIAGNOSIS — F1410 Cocaine abuse, uncomplicated: Secondary | ICD-10-CM

## 2018-05-12 DIAGNOSIS — D61818 Other pancytopenia: Secondary | ICD-10-CM

## 2018-05-12 DIAGNOSIS — F17210 Nicotine dependence, cigarettes, uncomplicated: Secondary | ICD-10-CM

## 2018-05-12 DIAGNOSIS — Z9981 Dependence on supplemental oxygen: Secondary | ICD-10-CM

## 2018-05-12 DIAGNOSIS — Z66 Do not resuscitate: Secondary | ICD-10-CM

## 2018-05-12 DIAGNOSIS — Z7951 Long term (current) use of inhaled steroids: Secondary | ICD-10-CM

## 2018-05-12 DIAGNOSIS — I10 Essential (primary) hypertension: Secondary | ICD-10-CM

## 2018-05-12 DIAGNOSIS — C7951 Secondary malignant neoplasm of bone: Secondary | ICD-10-CM

## 2018-05-12 DIAGNOSIS — C779 Secondary and unspecified malignant neoplasm of lymph node, unspecified: Secondary | ICD-10-CM

## 2018-05-12 DIAGNOSIS — C641 Malignant neoplasm of right kidney, except renal pelvis: Principal | ICD-10-CM

## 2018-05-12 DIAGNOSIS — C7801 Secondary malignant neoplasm of right lung: Secondary | ICD-10-CM

## 2018-05-12 DIAGNOSIS — Z7952 Long term (current) use of systemic steroids: Secondary | ICD-10-CM

## 2018-05-12 DIAGNOSIS — G893 Neoplasm related pain (acute) (chronic): Secondary | ICD-10-CM

## 2018-05-13 DIAGNOSIS — I10 Essential (primary) hypertension: Secondary | ICD-10-CM

## 2018-05-13 DIAGNOSIS — J449 Chronic obstructive pulmonary disease, unspecified: Secondary | ICD-10-CM

## 2018-05-13 DIAGNOSIS — C7951 Secondary malignant neoplasm of bone: Secondary | ICD-10-CM

## 2018-05-13 DIAGNOSIS — F17210 Nicotine dependence, cigarettes, uncomplicated: Secondary | ICD-10-CM

## 2018-05-13 DIAGNOSIS — G893 Neoplasm related pain (acute) (chronic): Secondary | ICD-10-CM

## 2018-05-13 DIAGNOSIS — F1410 Cocaine abuse, uncomplicated: Secondary | ICD-10-CM

## 2018-05-13 DIAGNOSIS — C7801 Secondary malignant neoplasm of right lung: Secondary | ICD-10-CM

## 2018-05-13 DIAGNOSIS — Z66 Do not resuscitate: Secondary | ICD-10-CM

## 2018-05-13 DIAGNOSIS — C779 Secondary and unspecified malignant neoplasm of lymph node, unspecified: Secondary | ICD-10-CM

## 2018-05-13 DIAGNOSIS — C641 Malignant neoplasm of right kidney, except renal pelvis: Principal | ICD-10-CM

## 2018-05-13 DIAGNOSIS — Z7951 Long term (current) use of inhaled steroids: Secondary | ICD-10-CM

## 2018-05-13 DIAGNOSIS — D61818 Other pancytopenia: Secondary | ICD-10-CM

## 2018-05-13 DIAGNOSIS — Z9981 Dependence on supplemental oxygen: Secondary | ICD-10-CM

## 2018-05-13 DIAGNOSIS — Z9181 History of falling: Secondary | ICD-10-CM

## 2018-05-13 NOTE — Unmapped (Signed)
[name], patient caregiver contacted the Communication Center regarding the following:    - Called to inform the patient nursing team that the patient passed away on 05-30-18.    Please contact [name] at [phone].    Thanks in advance,    Jodi Mourning  [facility] Communication Center   [phone]

## 2018-05-28 NOTE — Unmapped (Signed)
RADIATION ONCOLOGY TREATMENT COMPLETION NOTE    Encounter Date: 05/08/2018  Patient Name: Russell [name]  Medical Record Number: [id number]    Referring Physician: No referring provider defined for this encounter.    Primary Care Provider: [facility]    DIAGNOSIS/ASSESSMENT:  50M with metastatic RCC, re-admitted with significant pain in the left hip 2/2 RCC  Prior RT to lung and R hip. Consulted several time for pain. Extensive marrow signal heterogeneity on MRI throughout the pelvis and proximal femora on MR  Plan  8 Gy x 1 = 8 Gy to left hemipelvis and femur        STAGE:  Cancer Staging  No matching staging information was found for the patient.    TREATMENT INTENT: palliative    CLINICAL TRIAL: no    CHEMOTHERAPY: not administered    [place] RADIATION ONCOLOGY FACILITY:  [facility] [place]    RADIATION TREATMENT SUMMARY:          Treatment site Treatment Technique/Modality Energy Dose per fraction Total number  of fractions Total dose Start date End date   Left hemipelvis and femur 3D CRT 15 MV/MeV 800 cGy 1 800 cGy 05/08/18   05/08/18                                   TOLERANCE TO TREATMENT:  No toxicities or complications    COMPLETED INTENDED COURSE:  yes    TREATMENT BREAK > 2 WEEKS:  no      PLAN FOR FOLLOW-UP: No planned follow-up. Patient unfortunately passed away several days after treatment.    Lonzo Cloud, MD, [facility]  Resident Physician PGY-3  Department of Radiation Oncology  Pager: [phone]

## 2018-06-01 DEATH — deceased

## 2019-06-23 IMAGING — CR DG SHOULDER 2+V*R*
3 series · 3 of 3 positions shown · non-contrast
Comparison: None.

CLINICAL DATA: Right shoulder pain after injury 3 months ago.

EXAM:
RIGHT SHOULDER - 2+ VIEW

[shoulder grashey]
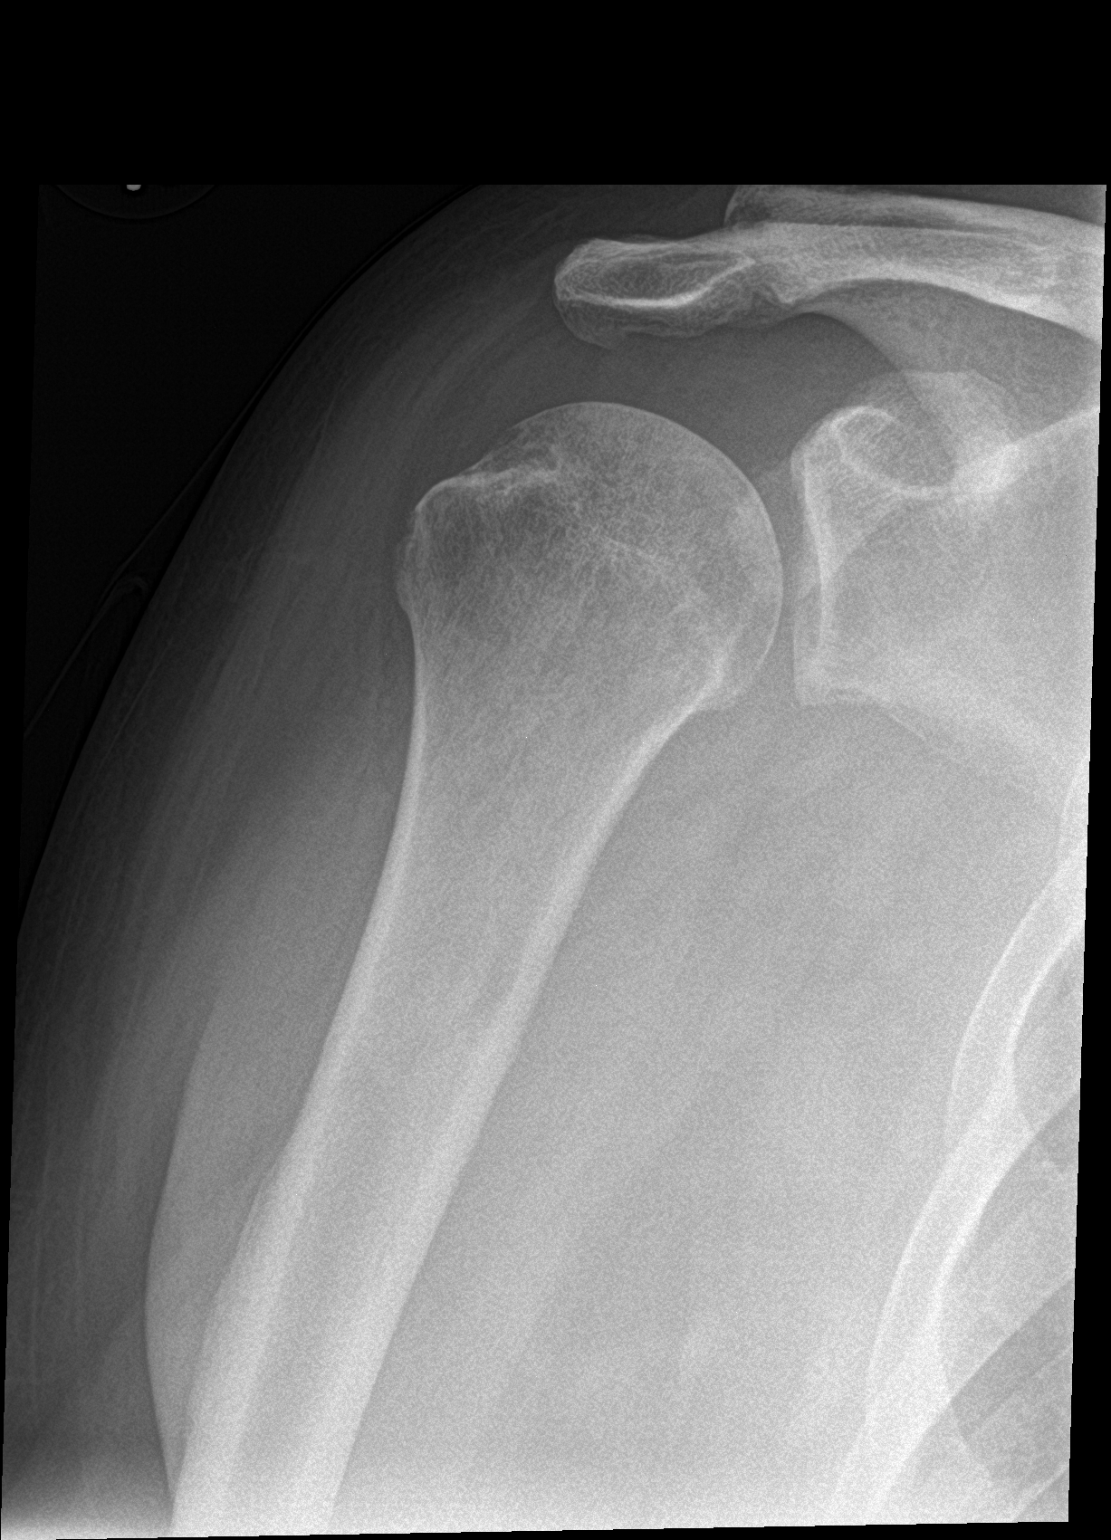

[shoulder y view]
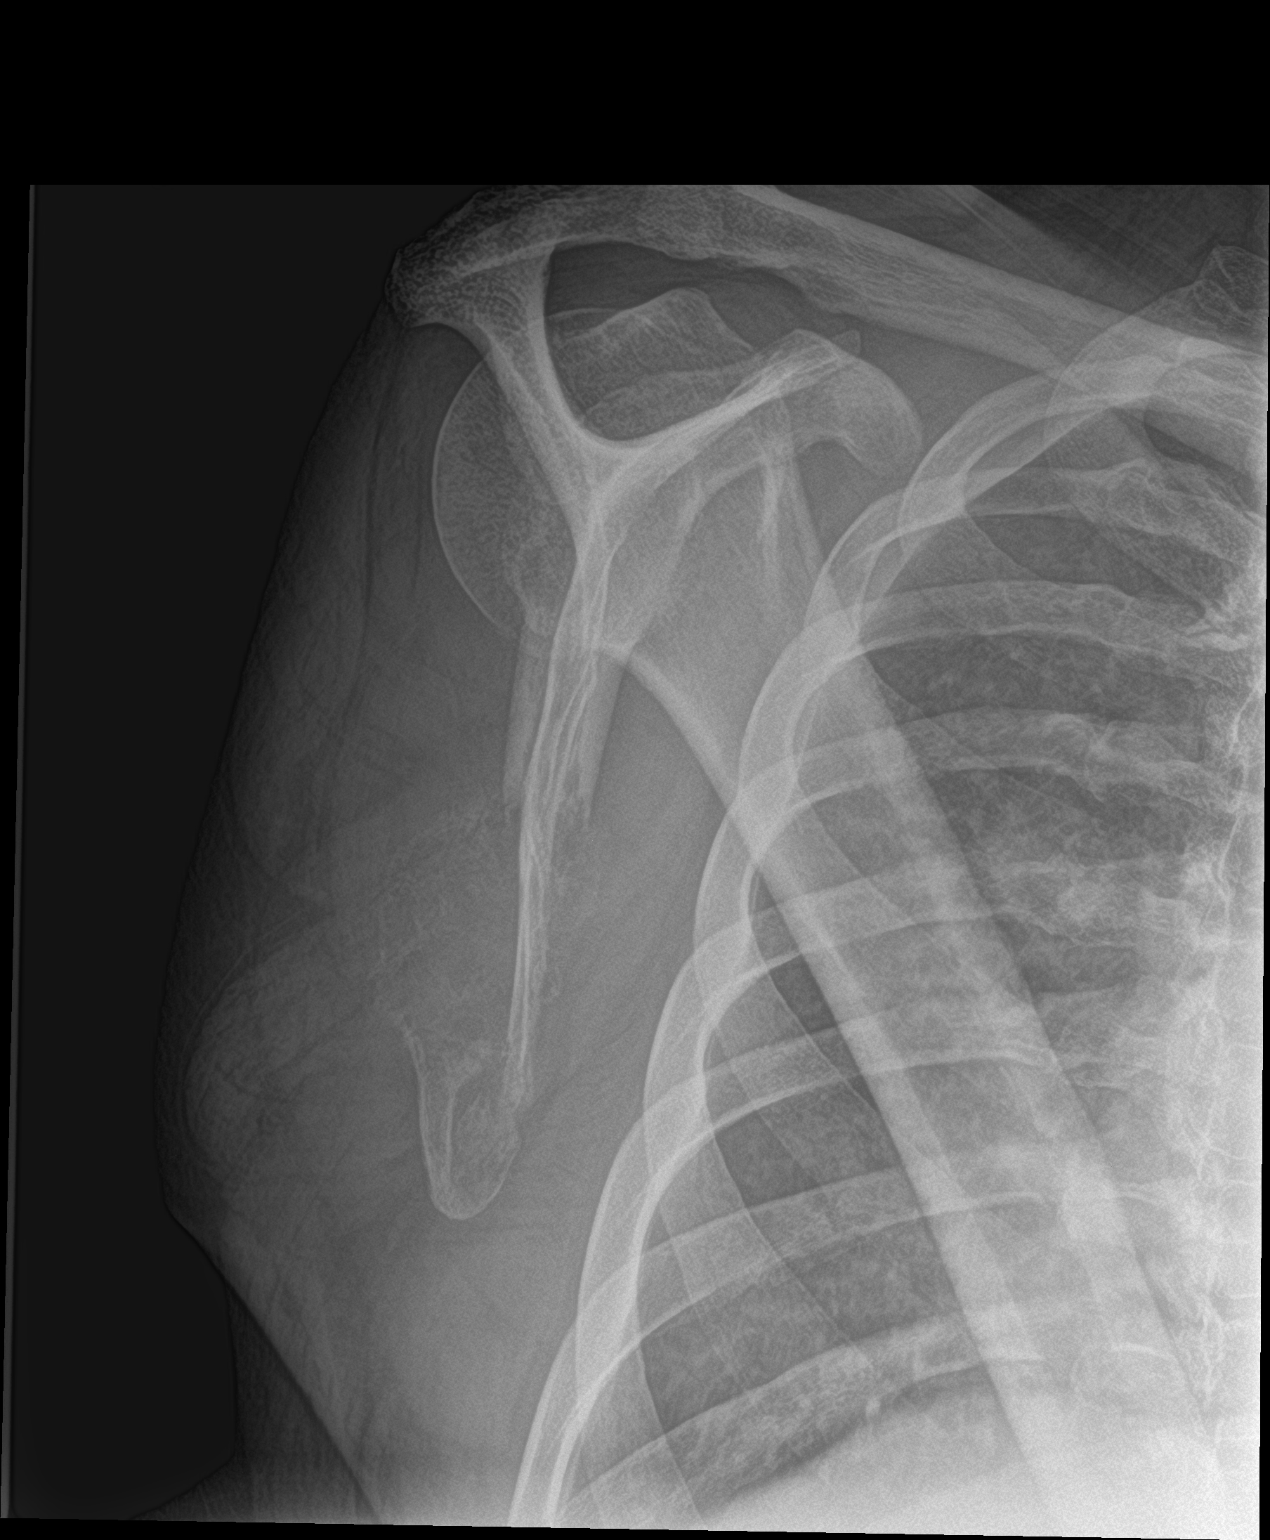

[shoulder ap neutral]
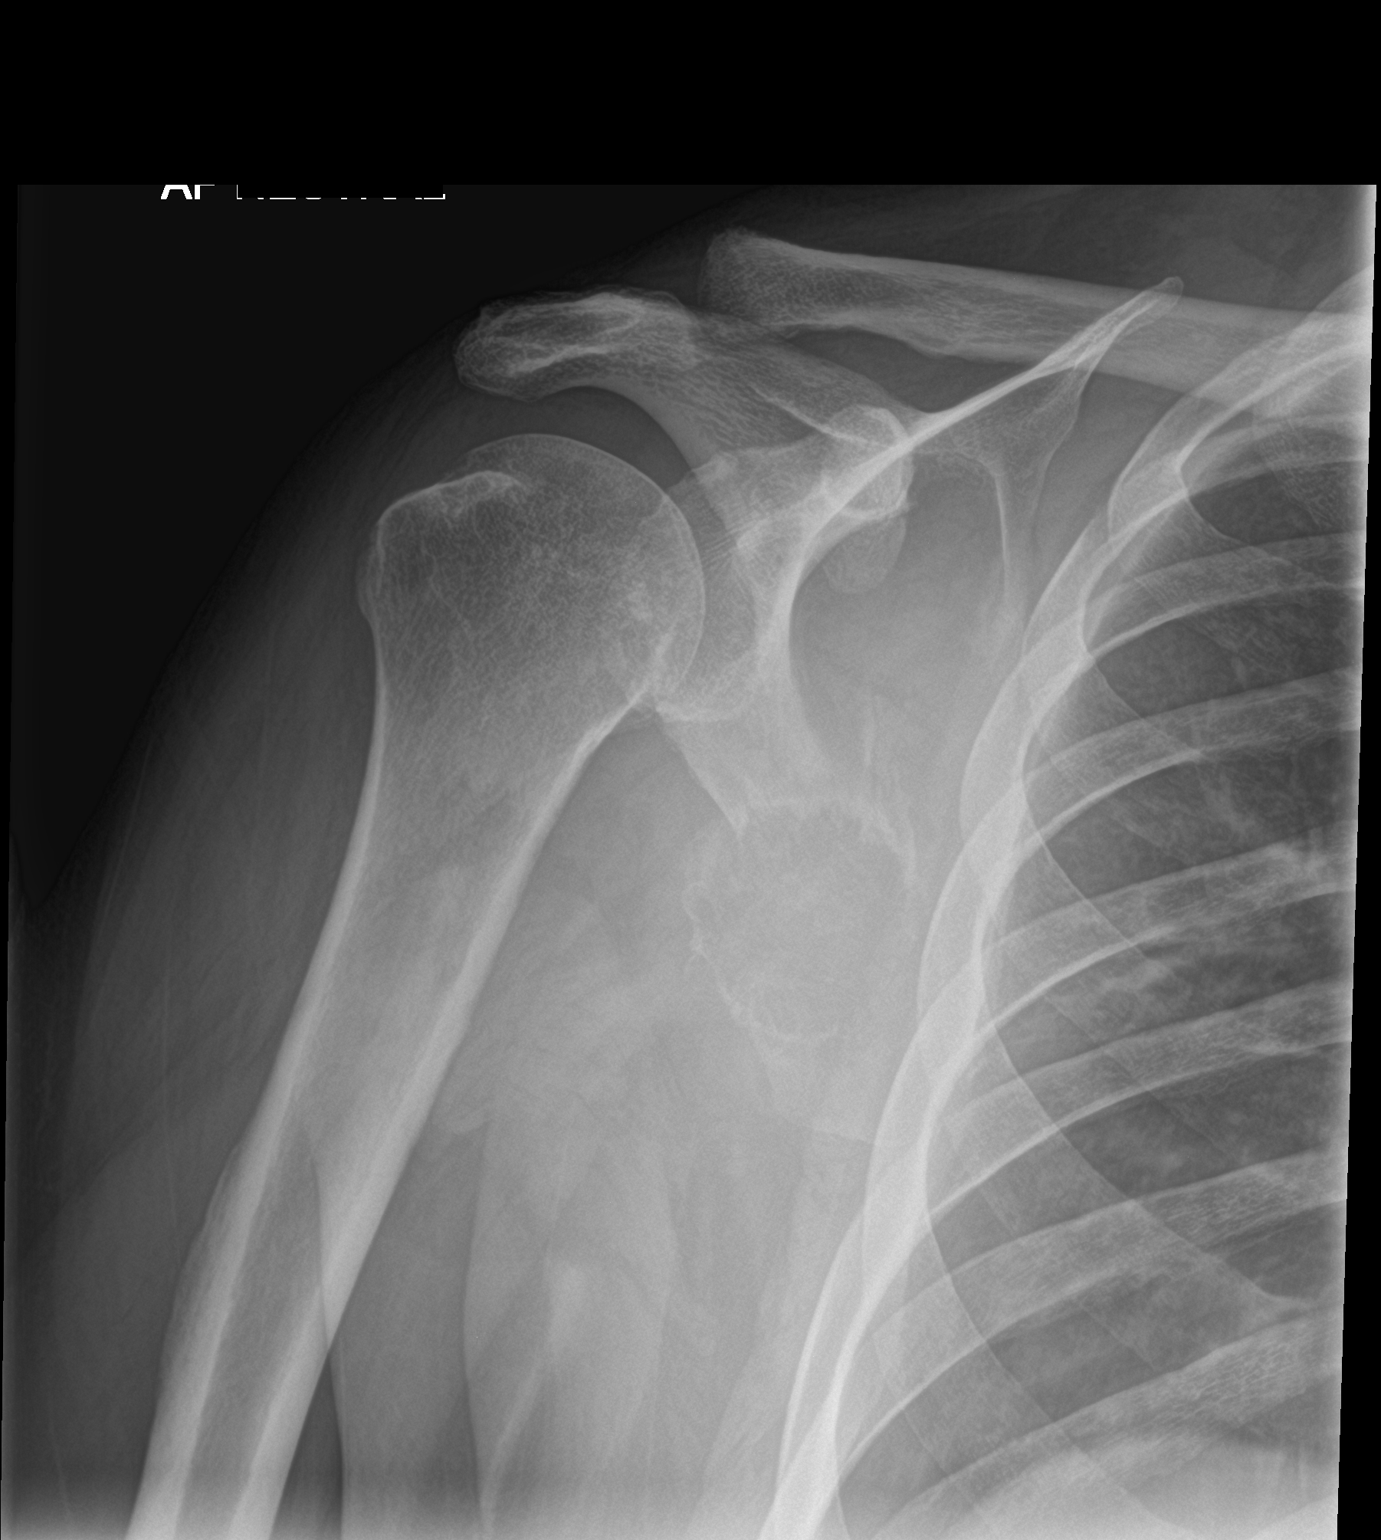

[3 of 3 positions shown; findings below may reference images not displayed]

FINDINGS: Lytic mass is seen involving the scapular blade inferiorly which
results in pathologic fracture. The proximal humerus and
glenohumeral joint appear normal.
IMPRESSION: Pathologic fracture seen involving right scapular blade inferiorly
secondary to lytic mass in this area. This is highly concerning for
metastatic disease or other malignancy, and further evaluation with
MRI is recommended.

## 2019-06-23 IMAGING — CT CT CHEST W/ CM
2 of 5 series · 12 of 36 positions shown, 15 images · IV contrast (iopamidol)
Comparison: Right shoulder radiographs 06/26/2017

CLINICAL DATA: Right scapula mass with concern for metastatic
disease. Right shoulder pain for 3 months. Hematuria for 2 weeks.
History of kidney stones.

EXAM:
CT CHEST, ABDOMEN, AND PELVIS WITH CONTRAST
TECHNIQUE: Multidetector CT imaging of the chest, abdomen and pelvis was
performed following the standard protocol during bolus
administration of intravenous contrast.
CONTRAST:  100mL [id number]-JUU IOPAMIDOL (IPDOJZ-JUU) INJECTION 61%

[Series 2: cap with · axial · 0.95mm/px · z∈[-680,-114]mm · 9 of 143 slices shown, 12 images]
[im 15/143  mediastinal]
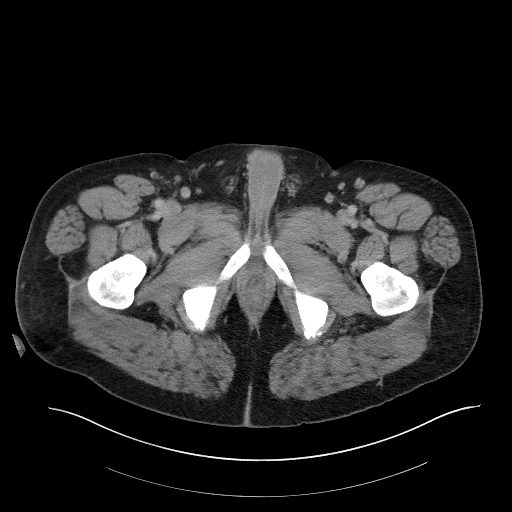
[im 15/143  lung]
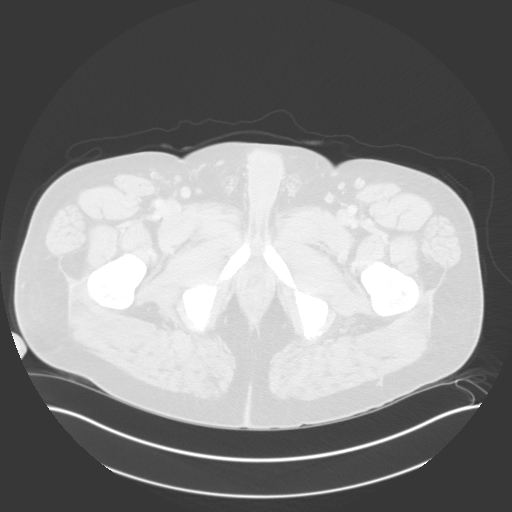
[im 29/143  lung]
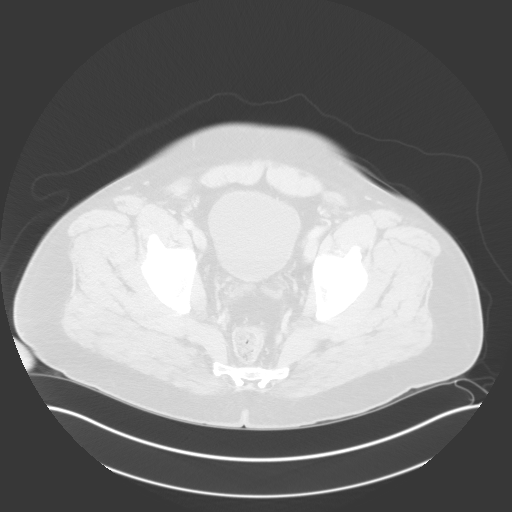
[im 43/143  lung]
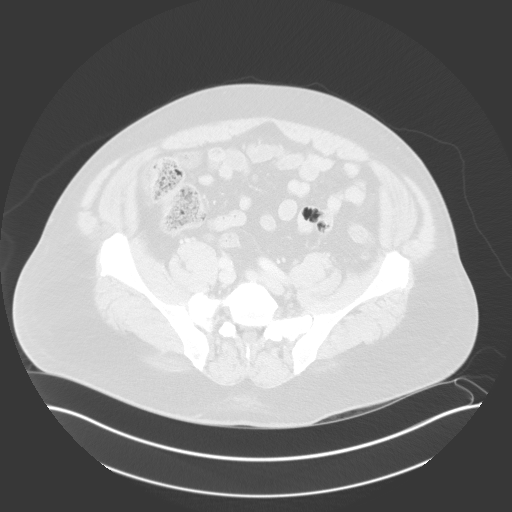
[im 57/143  lung]
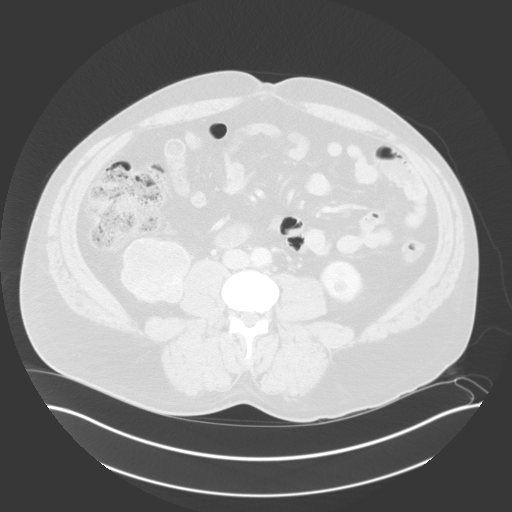
[im 72/143  mediastinal]
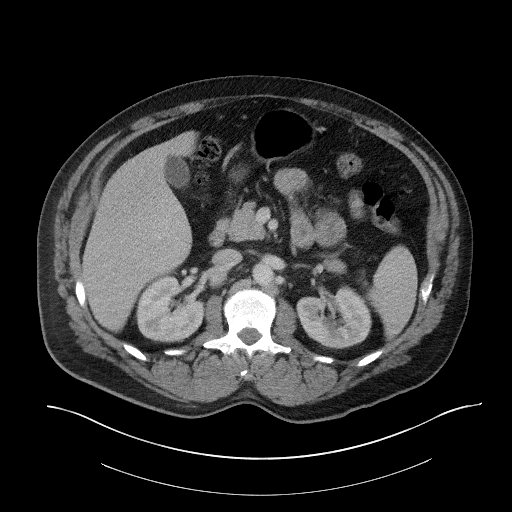
[im 72/143  lung]
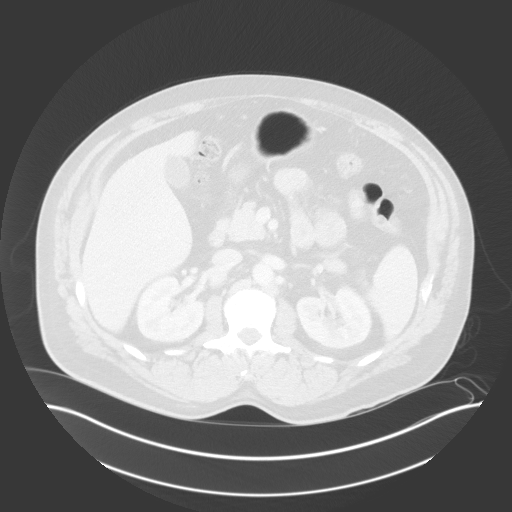
[im 86/143  lung]
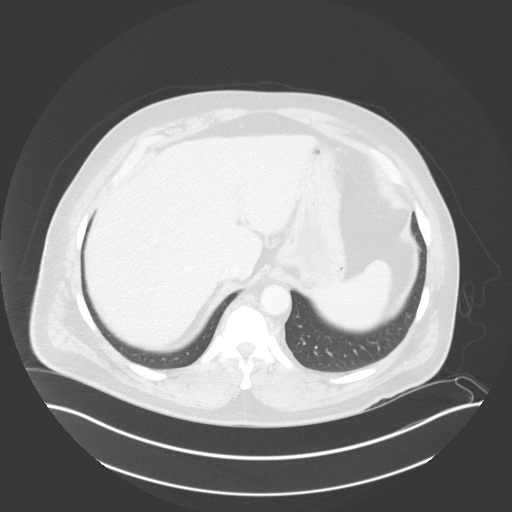
[im 100/143  lung]
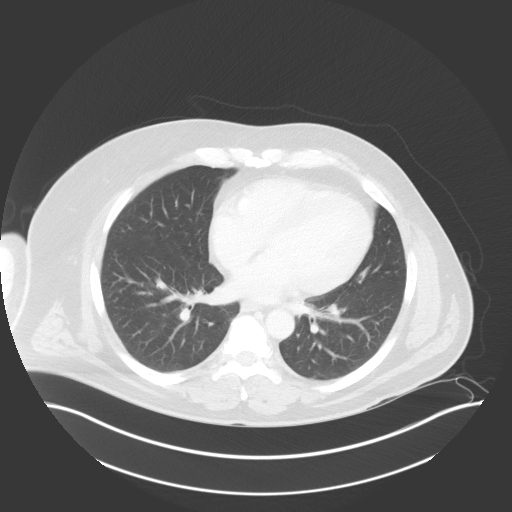
[im 114/143  lung]
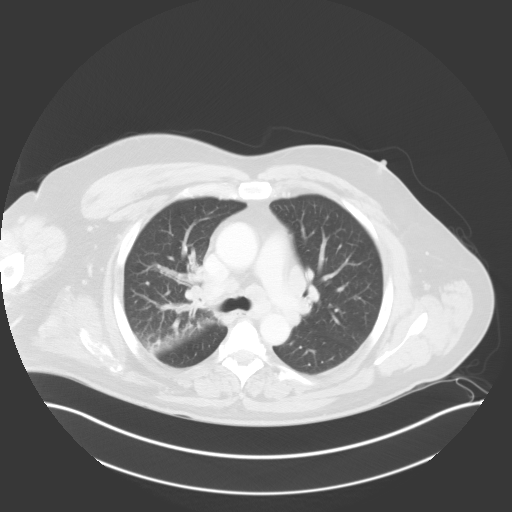
[im 128/143  mediastinal]
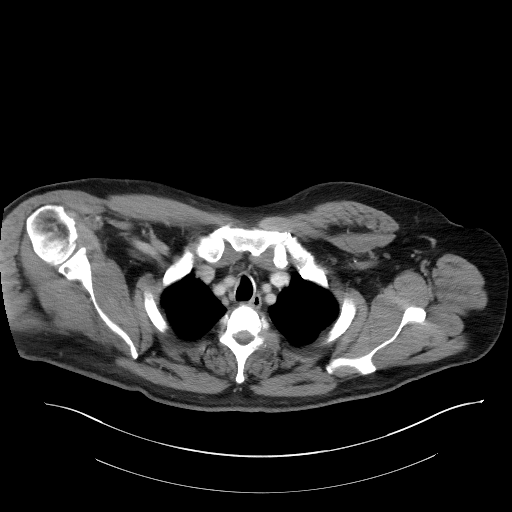
[im 128/143  lung]
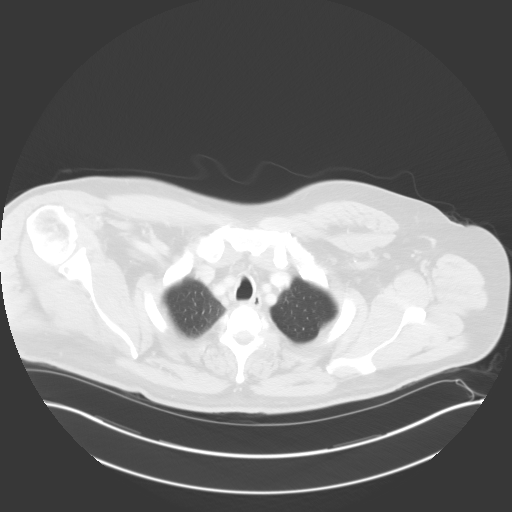

[Series 6: coronals · coronal · 0.99mm/px · 3 of 161 slices shown]
[im 33/161  lung]
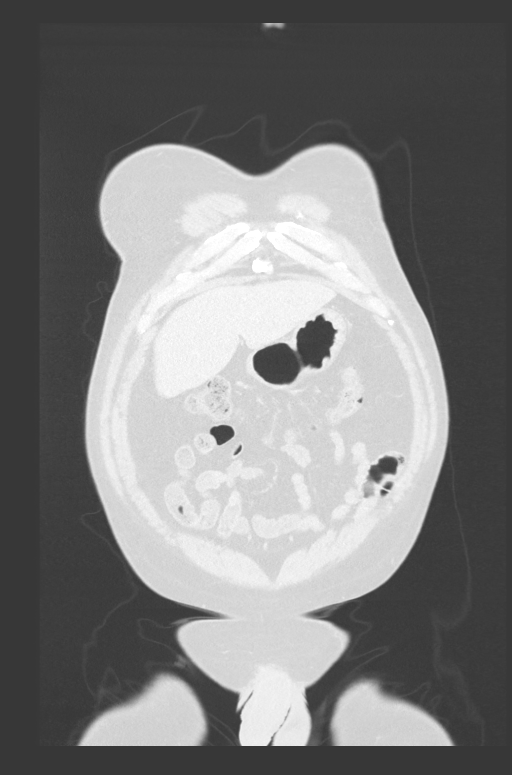
[im 65/161  lung]
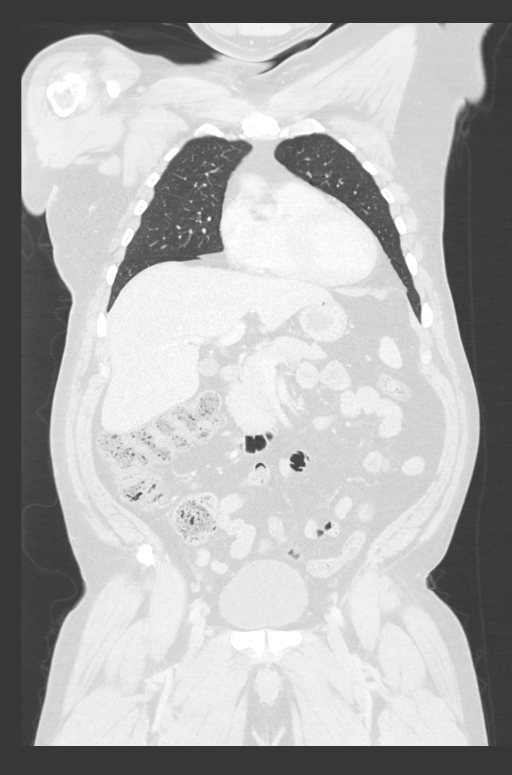
[im 97/161  lung]
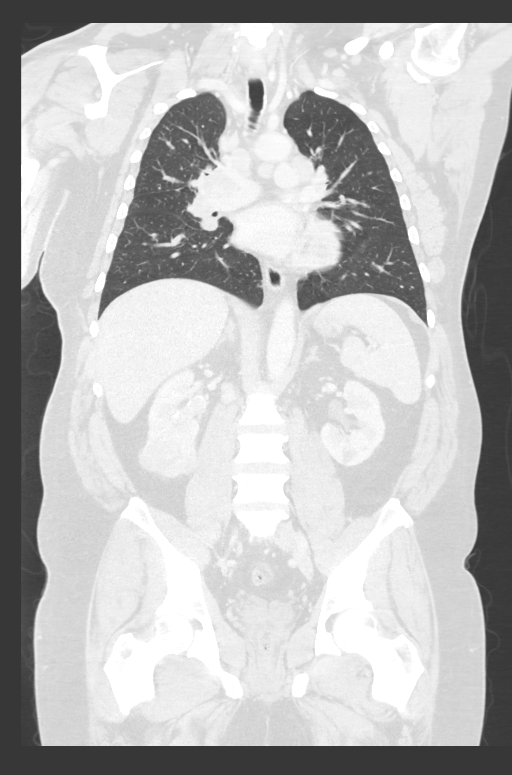

[12 of 36 positions shown; findings below may reference images not displayed]

FINDINGS: CT CHEST FINDINGS

Cardiovascular: Normal caliber of the thoracic aorta. LAD and right
coronary artery calcification. Normal heart size. No pericardial
effusion.

Mediastinum/Nodes: Enlarged lymph nodes are present throughout the
mediastinum and hila bilaterally. Representative lymph nodes include
paratracheal nodes measuring up to 1.8 cm in short axis on the
right, a 1.9 cm AP window lymph node, and a 1.6 cm subcarinal lymph
node. A right hilar mass measures 4.5 x 3.6 cm. Smaller abnormal
hilar lymph nodes are present bilaterally. The thyroid and esophagus
are unremarkable.

Lungs/Pleura: No pleural effusion or pneumothorax. Lung nodules
measure 12 mm in the right upper lobe (series 5, image 59), 5 mm in
the right lower lobe (series 5, image 98), 3 mm in the apical left
upper lobe (series 5, image 36), 4 mm more inferiorly in the
anterior left upper lobe (series 5, image 54), and 4 mm posteriorly
in the left upper lobe (series 5, image 56). There is prominent
bronchial wall thickening in the right upper lobe with less
pronounced thickening in the right lower lobe. A 5 mm round nodule
is present in the lumen of the distal right upper lobe bronchus.
Mild dependent subpleural opacity in the right upper lobe may
reflect atelectasis.

Musculoskeletal: As seen on today's earlier radiographs, there is a
destructive mass involving the right scapular blade measuring
approximately 6.7 x 3.0 cm. Smaller lytic lesions are present in the
right acromion, left scapula near the base of the coracoid process,
bilateral glenoid, bilateral clavicular heads, inferior sternal
body, right third rib, proximal left tenth rib (3.8 cm expansile
lesion), and spine (most notably C7 and T9).

CT ABDOMEN PELVIS FINDINGS

Hepatobiliary: No focal liver abnormality is seen. No gallstones,
gallbladder wall thickening, or biliary dilatation.

Pancreas: Unremarkable.

Spleen: Unremarkable.

Adrenals/Urinary Tract: Unremarkable adrenal glands. A
heterogeneously enhancing right lower pole renal mass measures 7.2 x
6.5 cm. Abnormal masslike soft tissue thickening involves the right
lower pole calices and renal pelvis with some peripheral
calcification. A 1.1 cm cyst is noted in the lower pole of the left
kidney. There is no hydronephrosis. Bladder is unremarkable.

Stomach/Bowel: The stomach is within normal limits. There is no
evidence of bowel obstruction or inflammation. Scattered left-sided
colonic diverticula are noted without evidence of diverticulitis.
The appendix is unremarkable.

Vascular/Lymphatic: Enlarged retroperitoneal lymph nodes are noted
including a 1.9 cm retrocaval lymph node just inferior to the right
renal vein and a 1.5 cm aortocaval lymph node more inferiorly. The
aorta is normal in caliber.

Reproductive: Unremarkable prostate.

Other: No intraperitoneal free fluid. Tiny fat containing umbilical
hernia.

Musculoskeletal: 3.5 cm lytic lesion with cortical destruction in
the right iliac wing. 2 cm lytic lesion in the left ischium. 2 cm
lesion in the proximal left femoral shaft.
IMPRESSION: 1. 7 cm right lower pole renal mass. This may reflect urothelial
carcinoma given involvement of the collecting system versus renal
cell carcinoma.
2. Metastatic disease with thoracic and abdominal lymphadenopathy,
lung nodules, and diffuse osseous metastases.
3. Subcentimeter lesion in the right upper lobe bronchus with
diffuse abnormal right upper lobe bronchial wall soft tissue
thickening.
